# Patient Record
Sex: Male | Born: 1935
Health system: Southern US, Community
[De-identification: ages and names within clinical notes are randomized; demographics above are authoritative.]

## PROBLEM LIST (undated history)

## (undated) DIAGNOSIS — K259 Gastric ulcer, unspecified as acute or chronic, without hemorrhage or perforation: Secondary | ICD-10-CM

## (undated) DIAGNOSIS — I639 Cerebral infarction, unspecified: Secondary | ICD-10-CM

## (undated) DIAGNOSIS — I6529 Occlusion and stenosis of unspecified carotid artery: Secondary | ICD-10-CM

## (undated) DIAGNOSIS — I2583 Coronary atherosclerosis due to lipid rich plaque: Secondary | ICD-10-CM

## (undated) DIAGNOSIS — B019 Varicella without complication: Secondary | ICD-10-CM

## (undated) DIAGNOSIS — I219 Acute myocardial infarction, unspecified: Secondary | ICD-10-CM

## (undated) DIAGNOSIS — K8050 Calculus of bile duct without cholangitis or cholecystitis without obstruction: Secondary | ICD-10-CM

## (undated) DIAGNOSIS — K209 Esophagitis, unspecified without bleeding: Secondary | ICD-10-CM

## (undated) DIAGNOSIS — I350 Nonrheumatic aortic (valve) stenosis: Secondary | ICD-10-CM

## (undated) DIAGNOSIS — K7460 Unspecified cirrhosis of liver: Secondary | ICD-10-CM

## (undated) DIAGNOSIS — I2510 Atherosclerotic heart disease of native coronary artery without angina pectoris: Secondary | ICD-10-CM

## (undated) DIAGNOSIS — R609 Edema, unspecified: Secondary | ICD-10-CM

## (undated) HISTORY — DX: Cerebral infarction, unspecified: I63.9

## (undated) HISTORY — PX: CATARACT EXTRACTION: SUR2

## (undated) HISTORY — DX: Esophagitis, unspecified without bleeding: K20.90

## (undated) HISTORY — DX: Gastric ulcer, unspecified as acute or chronic, without hemorrhage or perforation: K25.9

## (undated) HISTORY — PX: OTHER SURGICAL HISTORY: SHX169

## (undated) HISTORY — DX: Unspecified cirrhosis of liver: K74.60

## (undated) HISTORY — DX: Calculus of bile duct without cholangitis or cholecystitis without obstruction: K80.50

## (undated) HISTORY — DX: Varicella without complication: B01.9

---

## 2007-03-20 ENCOUNTER — Emergency Department (HOSPITAL_COMMUNITY): Admission: EM | Admit: 2007-03-20 | Discharge: 2007-03-21 | Payer: Self-pay | Admitting: *Deleted

## 2007-11-01 ENCOUNTER — Ambulatory Visit (HOSPITAL_BASED_OUTPATIENT_CLINIC_OR_DEPARTMENT_OTHER): Admission: RE | Admit: 2007-11-01 | Discharge: 2007-11-01 | Payer: Self-pay | Admitting: Urology

## 2010-10-29 NOTE — Op Note (Signed)
David Bradshaw, David Bradshaw               ACCOUNT NO.:  [id number]   MEDICAL RECORD NO.:  [id number]          PATIENT TYPE:  AMB   LOCATION:  [facility]                         FACILITY:  [facility]   PHYSICIAN:  Valetta Fuller, M.D.  DATE OF BIRTH:  1926/09/22   DATE OF PROCEDURE:  11/01/2007  DATE OF DISCHARGE:                               OPERATIVE REPORT   PREOPERATIVE DIAGNOSIS:  Right hydrocele.   POSTOPERATIVE DIAGNOSIS:  Simple right hydrocele.   PROCEDURE PERFORMED:  Repair of right hydrocele, bottleneck.   SURGEON:  Valetta Fuller, M.D.   ASSISTANT:  Dr. Melina Schools   ANESTHESIA:  General.   DRAINS:  None.   ESTIMATED BLOOD LOSS:  Minimal.   INDICATIONS FOR PROCEDURE:  [name] is a 77 year old male who had a  right-sided noncommunicating hydrocele by exam.  This was confirmed with  ultrasound.  He had undergone a period of observation but it continued  to bother him and therefore surgical repair was undertaken.   DESCRIPTION OF PROCEDURE:  The patient brought to the operating room.  He was identified by his arm band .  Informed consent was verified and  perioperative time-out was performed.  After the successful induction of  general anesthesia, the patient's genitalia were shaved, prepped and  draped in usual fashion.  A 5 cm incision was made vertically along the  median raphe of the scrotum.  Bovie cautery was used to enter the right  hemiscrotum.  We dissected off the layers through dartos down to tunica  vaginalis.  We then bluntly freed up the hydrocele and delivered it into  the operative field.  Blunt dissection was used to sweep away the  adventitial tissue overlying the hydrocele sac.  Once this was done, we  entered it sharply and drained it.  The tunica vaginalis was then opened  vertically in both cranial and caudal directions.  Once this was done,  we everted the hydrocele sac and examined the testis.  It was viable.  No testicular or epididymal appendages  were seen.  We used Bovie cautery  to ensure hemostasis.  Subtotal reduction the hydrocele sac.  It was  then closed in a bottleneck type repair.  Great care was taken not to  strangulate the cord.  Once the repair was complete, the testis was  placed back in the hemiscrotum.  We closed dartos with a running 3-0  Vicryl.  Skin was closed with running 4-0 Vicryl.  A Tegaderm dressing  was applied.  At this time, the procedure was terminated.  The patient  tolerated the procedure well and there were no  complications.  Barron Alvine was the attending primary responsible  physician who was present and participated in the procedure.   DISPOSITION:  To the Post Anesthesia Care Unit.     ______________________________  Melina Schools, M.D.      Valetta Fuller, M.D.  Electronically Signed    JR/MEDQ  D:  11/01/2007  T:  11/01/2007  Job:  [id number]

## 2011-03-12 LAB — POCT HEMOGLOBIN-HEMACUE
Hemoglobin: 17.4 — ABNORMAL HIGH
Operator id: 268271

## 2011-03-27 LAB — CBC
HCT: 54 — ABNORMAL HIGH
Hemoglobin: 18.1 — ABNORMAL HIGH
MCHC: 33.5
MCV: 92.8
Platelets: 239
RBC: 5.83 — ABNORMAL HIGH
RDW: 13.6
WBC: 16.9 — ABNORMAL HIGH

## 2011-03-27 LAB — URINALYSIS, ROUTINE W REFLEX MICROSCOPIC
Bilirubin Urine: NEGATIVE
Glucose, UA: NEGATIVE
Hgb urine dipstick: NEGATIVE
Ketones, ur: 40 — AB
Nitrite: NEGATIVE
Protein, ur: NEGATIVE
Specific Gravity, Urine: 1.023
Urobilinogen, UA: 1
pH: 6

## 2011-03-27 LAB — POCT CARDIAC MARKERS
CKMB, poc: 1.5
CKMB, poc: 1.7
Myoglobin, poc: 72.9
Myoglobin, poc: 87.8
Operator id: 284251
Operator id: 284251
Troponin i, poc: <0.05
Troponin i, poc: <0.05

## 2011-03-27 LAB — DIFFERENTIAL
Basophils Absolute: 0.2 — ABNORMAL HIGH
Basophils Relative: 1
Eosinophils Absolute: 0.2
Eosinophils Relative: 1
Lymphocytes Relative: 42
Lymphs Abs: 7.1 — ABNORMAL HIGH
Monocytes Absolute: 0.8 — ABNORMAL HIGH
Monocytes Relative: 5
Neutro Abs: 8.6 — ABNORMAL HIGH
Neutrophils Relative %: 51

## 2011-03-27 LAB — I-STAT 8, (EC8 V) (CONVERTED LAB)
BUN: 16
Bicarbonate: 24.9 — ABNORMAL HIGH
Chloride: 107
Glucose, Bld: 168 — ABNORMAL HIGH
HCT: 58 — ABNORMAL HIGH
Hemoglobin: 19.7 — ABNORMAL HIGH
Operator id: 284251
Potassium: 4.6
Sodium: 138
TCO2: 26
pCO2, Ven: 42 — ABNORMAL LOW
pH, Ven: 7.38 — ABNORMAL HIGH

## 2011-03-27 LAB — HEPATIC FUNCTION PANEL
ALT: 16
AST: 34
Albumin: 3.9
Alkaline Phosphatase: 66
Bilirubin, Direct: 0.5 — ABNORMAL HIGH
Indirect Bilirubin: 1 — ABNORMAL HIGH
Total Bilirubin: 1.5 — ABNORMAL HIGH
Total Protein: 7.2

## 2011-03-27 LAB — B-NATRIURETIC PEPTIDE (CONVERTED LAB): Pro B Natriuretic peptide (BNP): 68

## 2011-03-27 LAB — D-DIMER, QUANTITATIVE: D-Dimer, Quant: 0.37

## 2011-03-27 LAB — POCT I-STAT CREATININE
Creatinine, Ser: 1.2
Operator id: 284251

## 2011-03-27 LAB — PATHOLOGIST SMEAR REVIEW

## 2015-07-18 DIAGNOSIS — Z6830 Body mass index (BMI) 30.0-30.9, adult: Secondary | ICD-10-CM | POA: Diagnosis not present

## 2016-01-30 ENCOUNTER — Emergency Department (HOSPITAL_COMMUNITY): Payer: Medicare HMO

## 2016-01-30 ENCOUNTER — Ambulatory Visit (HOSPITAL_COMMUNITY)
Admission: EM | Admit: 2016-01-30 | Discharge: 2016-01-30 | Disposition: A | Discharge status: Nursing Home (Includes ICF) | Payer: 59

## 2016-01-30 ENCOUNTER — Encounter (HOSPITAL_COMMUNITY): Payer: Self-pay | Admitting: Family Medicine

## 2016-01-30 ENCOUNTER — Encounter (HOSPITAL_COMMUNITY): Payer: Self-pay | Admitting: Emergency Medicine

## 2016-01-30 ENCOUNTER — Observation Stay (HOSPITAL_COMMUNITY): Payer: Medicare HMO

## 2016-01-30 ENCOUNTER — Inpatient Hospital Stay (HOSPITAL_COMMUNITY)
Admission: EM | Admit: 2016-01-30 | Discharge: 2016-02-06 | DRG: 066 | Disposition: A | Discharge status: Home / Self Care | Payer: Medicare HMO | Attending: Internal Medicine | Admitting: Internal Medicine

## 2016-01-30 DIAGNOSIS — D582 Other hemoglobinopathies: Secondary | ICD-10-CM | POA: Diagnosis not present

## 2016-01-30 DIAGNOSIS — R9439 Abnormal result of other cardiovascular function study: Secondary | ICD-10-CM

## 2016-01-30 DIAGNOSIS — M5412 Radiculopathy, cervical region: Secondary | ICD-10-CM | POA: Diagnosis present

## 2016-01-30 DIAGNOSIS — R0602 Shortness of breath: Secondary | ICD-10-CM

## 2016-01-30 DIAGNOSIS — I6521 Occlusion and stenosis of right carotid artery: Secondary | ICD-10-CM | POA: Diagnosis not present

## 2016-01-30 DIAGNOSIS — N289 Disorder of kidney and ureter, unspecified: Secondary | ICD-10-CM | POA: Diagnosis present

## 2016-01-30 DIAGNOSIS — Z7982 Long term (current) use of aspirin: Secondary | ICD-10-CM

## 2016-01-30 DIAGNOSIS — R200 Anesthesia of skin: Secondary | ICD-10-CM | POA: Diagnosis not present

## 2016-01-30 DIAGNOSIS — I639 Cerebral infarction, unspecified: Secondary | ICD-10-CM

## 2016-01-30 DIAGNOSIS — I350 Nonrheumatic aortic (valve) stenosis: Secondary | ICD-10-CM | POA: Diagnosis present

## 2016-01-30 DIAGNOSIS — R21 Rash and other nonspecific skin eruption: Secondary | ICD-10-CM | POA: Diagnosis not present

## 2016-01-30 DIAGNOSIS — I63231 Cerebral infarction due to unspecified occlusion or stenosis of right carotid arteries: Secondary | ICD-10-CM | POA: Diagnosis not present

## 2016-01-30 DIAGNOSIS — Z955 Presence of coronary angioplasty implant and graft: Secondary | ICD-10-CM

## 2016-01-30 DIAGNOSIS — Z8249 Family history of ischemic heart disease and other diseases of the circulatory system: Secondary | ICD-10-CM

## 2016-01-30 DIAGNOSIS — R931 Abnormal findings on diagnostic imaging of heart and coronary circulation: Secondary | ICD-10-CM | POA: Diagnosis not present

## 2016-01-30 DIAGNOSIS — Z87891 Personal history of nicotine dependence: Secondary | ICD-10-CM

## 2016-01-30 DIAGNOSIS — R29898 Other symptoms and signs involving the musculoskeletal system: Secondary | ICD-10-CM | POA: Diagnosis not present

## 2016-01-30 DIAGNOSIS — I2583 Coronary atherosclerosis due to lipid rich plaque: Secondary | ICD-10-CM | POA: Diagnosis present

## 2016-01-30 DIAGNOSIS — E785 Hyperlipidemia, unspecified: Secondary | ICD-10-CM | POA: Diagnosis present

## 2016-01-30 DIAGNOSIS — R079 Chest pain, unspecified: Secondary | ICD-10-CM | POA: Diagnosis not present

## 2016-01-30 DIAGNOSIS — T45525A Adverse effect of antithrombotic drugs, initial encounter: Secondary | ICD-10-CM | POA: Diagnosis not present

## 2016-01-30 DIAGNOSIS — G459 Transient cerebral ischemic attack, unspecified: Secondary | ICD-10-CM

## 2016-01-30 DIAGNOSIS — G458 Other transient cerebral ischemic attacks and related syndromes: Secondary | ICD-10-CM

## 2016-01-30 DIAGNOSIS — R531 Weakness: Secondary | ICD-10-CM | POA: Diagnosis not present

## 2016-01-30 DIAGNOSIS — R29700 NIHSS score 0: Secondary | ICD-10-CM | POA: Diagnosis present

## 2016-01-30 DIAGNOSIS — G451 Carotid artery syndrome (hemispheric): Secondary | ICD-10-CM | POA: Diagnosis not present

## 2016-01-30 DIAGNOSIS — Z8673 Personal history of transient ischemic attack (TIA), and cerebral infarction without residual deficits: Secondary | ICD-10-CM | POA: Diagnosis present

## 2016-01-30 DIAGNOSIS — I252 Old myocardial infarction: Secondary | ICD-10-CM

## 2016-01-30 DIAGNOSIS — D751 Secondary polycythemia: Secondary | ICD-10-CM | POA: Diagnosis not present

## 2016-01-30 DIAGNOSIS — I6529 Occlusion and stenosis of unspecified carotid artery: Secondary | ICD-10-CM

## 2016-01-30 DIAGNOSIS — R208 Other disturbances of skin sensation: Secondary | ICD-10-CM | POA: Diagnosis not present

## 2016-01-30 DIAGNOSIS — I638 Other cerebral infarction: Secondary | ICD-10-CM | POA: Diagnosis not present

## 2016-01-30 DIAGNOSIS — I77819 Aortic ectasia, unspecified site: Secondary | ICD-10-CM | POA: Diagnosis present

## 2016-01-30 DIAGNOSIS — I63031 Cerebral infarction due to thrombosis of right carotid artery: Secondary | ICD-10-CM | POA: Diagnosis not present

## 2016-01-30 DIAGNOSIS — I63032 Cerebral infarction due to thrombosis of left carotid artery: Secondary | ICD-10-CM | POA: Diagnosis not present

## 2016-01-30 DIAGNOSIS — I2510 Atherosclerotic heart disease of native coronary artery without angina pectoris: Secondary | ICD-10-CM | POA: Diagnosis present

## 2016-01-30 DIAGNOSIS — N141 Nephropathy induced by other drugs, medicaments and biological substances: Secondary | ICD-10-CM | POA: Diagnosis present

## 2016-01-30 HISTORY — DX: Atherosclerotic heart disease of native coronary artery without angina pectoris: I25.10

## 2016-01-30 HISTORY — DX: Coronary atherosclerosis due to lipid rich plaque: I25.83

## 2016-01-30 HISTORY — DX: Acute myocardial infarction, unspecified: I21.9

## 2016-01-30 LAB — COMPREHENSIVE METABOLIC PANEL
ALT: 30 U/L (ref 17–63)
AST: 43 U/L — ABNORMAL HIGH (ref 15–41)
Albumin: 3.6 g/dL (ref 3.5–5.0)
Alkaline Phosphatase: 87 U/L (ref 38–126)
Anion gap: 7 (ref 5–15)
BUN: 13 mg/dL (ref 6–20)
CO2: 24 mmol/L (ref 22–32)
Calcium: 9.2 mg/dL (ref 8.9–10.3)
Chloride: 107 mmol/L (ref 101–111)
Creatinine, Ser: 1.2 mg/dL (ref 0.61–1.24)
GFR calc Af Amer: >60 mL/min (ref >60)
GFR calc non Af Amer: 53 mL/min — ABNORMAL LOW (ref >60)
Glucose, Bld: 140 mg/dL — ABNORMAL HIGH (ref 65–99)
Potassium: 4.9 mmol/L (ref 3.5–5.1)
Sodium: 138 mmol/L (ref 135–145)
Total Bilirubin: 1 mg/dL (ref 0.3–1.2)
Total Protein: 6.8 g/dL (ref 6.5–8.1)

## 2016-01-30 LAB — I-STAT CHEM 8, ED
BUN: 17 mg/dL (ref 6–20)
Calcium, Ion: 1.12 mmol/L (ref 1.12–1.23)
Chloride: 105 mmol/L (ref 101–111)
Creatinine, Ser: 1 mg/dL (ref 0.61–1.24)
Glucose, Bld: 134 mg/dL — ABNORMAL HIGH (ref 65–99)
HCT: 56 % — ABNORMAL HIGH (ref 39.0–52.0)
Hemoglobin: 19 g/dL — ABNORMAL HIGH (ref 13.0–17.0)
Potassium: 4.6 mmol/L (ref 3.5–5.1)
Sodium: 141 mmol/L (ref 135–145)
TCO2: 24 mmol/L (ref 0–100)

## 2016-01-30 LAB — I-STAT TROPONIN, ED: Troponin i, poc: 0 ng/mL (ref 0.00–0.08)

## 2016-01-30 LAB — DIFFERENTIAL
Basophils Absolute: 0.1 10*3/uL (ref 0.0–0.1)
Basophils Relative: 1 %
Eosinophils Absolute: 0.1 10*3/uL (ref 0.0–0.7)
Eosinophils Relative: 1 %
Lymphocytes Relative: 32 %
Lymphs Abs: 3.4 10*3/uL (ref 0.7–4.0)
Monocytes Absolute: 0.9 10*3/uL (ref 0.1–1.0)
Monocytes Relative: 8 %
Neutro Abs: 6.2 10*3/uL (ref 1.7–7.7)
Neutrophils Relative %: 58 %

## 2016-01-30 LAB — CBC
HCT: 53.7 % — ABNORMAL HIGH (ref 39.0–52.0)
Hemoglobin: 18.3 g/dL — ABNORMAL HIGH (ref 13.0–17.0)
MCH: 31.7 pg (ref 26.0–34.0)
MCHC: 34.1 g/dL (ref 30.0–36.0)
MCV: 93.1 fL (ref 78.0–100.0)
Platelets: 184 10*3/uL (ref 150–400)
RBC: 5.77 MIL/uL (ref 4.22–5.81)
RDW: 13.7 % (ref 11.5–15.5)
WBC: 10.6 10*3/uL — ABNORMAL HIGH (ref 4.0–10.5)

## 2016-01-30 LAB — PROTIME-INR
INR: 1.04
Prothrombin Time: 13.6 seconds (ref 11.4–15.2)

## 2016-01-30 LAB — APTT: aPTT: 33 seconds (ref 24–36)

## 2016-01-30 MED ORDER — ATORVASTATIN CALCIUM 40 MG PO TABS
40.0000 mg | ORAL_TABLET | Freq: Every day | ORAL | Status: DC
  Administered 2016-01-31: 40 mg via ORAL
  Filled 2016-01-30 (×2): qty 1

## 2016-01-30 MED ORDER — STROKE: EARLY STAGES OF RECOVERY BOOK
Freq: Once | Status: AC
  Administered 2016-02-02: 05:00:00
  Filled 2016-01-30: qty 1

## 2016-01-30 MED ORDER — ASPIRIN 325 MG PO TABS
325.0000 mg | ORAL_TABLET | Freq: Every day | ORAL | Status: DC
  Administered 2016-01-31 – 2016-02-03 (×3): 325 mg via ORAL
  Filled 2016-01-30 (×4): qty 1

## 2016-01-30 NOTE — ED Provider Notes (Signed)
[facility] DEPT Provider Note   CSN: [id number] Arrival date & time: 01/30/16  1517     History   Chief Complaint Chief Complaint  Patient presents with  . Extremity Weakness    HPI David Bradshaw is a 82 y.o. male.  The history is provided by the patient.  Extremity Weakness  This is a new problem. Episode onset: 12 hours ago. lasted 30 min. The problem occurs rarely. The problem has been resolved. Pertinent negatives include no chest pain, no abdominal pain, no headaches and no shortness of breath. Nothing aggravates the symptoms. Relieved by: self resolving. He has tried nothing for the symptoms.   Reports associated nausea w/o emesis. No diaphoresis. No arm pain or neck pain. No back pain. No vision or speech change, light-headedness/dizziness.  Prior MI in '88 and '90 s/p PCI angioplasty, no stents or CABG.   No past medical history on file.  Patient Active Problem List   Diagnosis Date Noted  . TIA (transient ischemic attack) 01/30/2016    No past surgical history on file.     Home Medications    Prior to Admission medications   Medication Sig Start Date End Date Taking? Authorizing Provider  aspirin EC 325 MG tablet Take 325 mg by mouth every other day.   Yes Historical Provider, MD  aspirin 81 MG tablet Take 81 mg by mouth daily.    Historical Provider, MD    Family History No family history on file.  Social History Social History  Substance Use Topics  . Smoking status: Not on file  . Smokeless tobacco: Not on file  . Alcohol use Not on file     Allergies   Review of patient's allergies indicates no known allergies.   Review of Systems Review of Systems  Constitutional: Negative for chills, diaphoresis, fatigue and fever.  HENT: Negative for congestion and rhinorrhea.   Eyes: Negative for visual disturbance.  Respiratory: Negative for cough, chest tightness and shortness of breath.   Cardiovascular: Negative for chest pain and leg  swelling.  Gastrointestinal: Positive for nausea. Negative for abdominal pain and vomiting.  Genitourinary: Negative for difficulty urinating.  Musculoskeletal: Positive for extremity weakness. Negative for back pain and gait problem.  Neurological: Positive for numbness. Negative for dizziness, speech difficulty, light-headedness and headaches.  All other systems reviewed and are negative.    Physical Exam Updated Vital Signs BP 147/79   Pulse 83   Temp 98.2 F (36.8 C) (Oral)   Resp 21   Ht 5' (1.524 m)   Wt 150 lb (68 kg)   SpO2 93%   BMI 29.29 kg/m   Physical Exam  Constitutional: He is oriented to person, place, and time. He appears well-nourished. No distress.  HENT:  Head: Normocephalic and atraumatic.  Right Ear: External ear normal.  Left Ear: External ear normal.  Eyes: Pupils are equal, round, and reactive to light. Right eye exhibits no discharge. Left eye exhibits no discharge. No scleral icterus.  Neck: Normal range of motion. Neck supple.  Cardiovascular: Normal rate.  Exam reveals no gallop and no friction rub.   No murmur heard. Pulmonary/Chest: Effort normal and breath sounds normal. No stridor. No respiratory distress. He has no wheezes. He has no rales. He exhibits no tenderness.  Abdominal: Soft. He exhibits no distension and no mass. There is no tenderness. There is no rebound and no guarding.  Musculoskeletal: He exhibits no edema or tenderness.  Neurological: He is alert and oriented to person,  place, and time.  Mental Status: Alert and oriented to person, place, and time. Attention and concentration normal. Speech clear. Recent memory is intac  Cranial Nerves  II Visual Fields: Intact to confrontation. Visual fields intact. III, IV, VI: Pupils equal and reactive to light and near. Full eye movement without nystagmus  V Facial Sensation: Normal. No weakness of masticatory muscles  VII: No facial weakness or asymmetry  VIII Auditory Acuity: Grossly  normal  IX/X: The uvula is midline; the palate elevates symmetrically  XI: Normal sternocleidomastoid and trapezius strength  XII: The tongue is midline. No atrophy or fasciculations.   Motor System: Muscle Strength: 5/5 and symmetric in the upper and lower extremities. No pronation or drift.  Muscle Tone: Tone and muscle bulk are normal in the upper and lower extremities.   Reflexes: DTRs: 2+ and symmetrical in all four extremities. Plantar responses are flexor bilaterally.  Coordination: Intact finger-to-nose, heel-to-shin, and rapid alternating movements. No tremor.  Sensation: Intact to light touch, and pinprick. Negative Romberg test.  Gait: Routine and tandem gait are normal    Skin: Skin is warm and dry. No rash noted. He is not diaphoretic. No erythema.     ED Treatments / Results  Labs (all labs ordered are listed, but only abnormal results are displayed) Labs Reviewed  CBC - Abnormal; Notable for the following:       Result Value   WBC 10.6 (*)    Hemoglobin 18.3 (*)    HCT 53.7 (*)    All other components within normal limits  COMPREHENSIVE METABOLIC PANEL - Abnormal; Notable for the following:    Glucose, Bld 140 (*)    AST 43 (*)    GFR calc non Af Amer 53 (*)    All other components within normal limits  I-STAT CHEM 8, ED - Abnormal; Notable for the following:    Glucose, Bld 134 (*)    Hemoglobin 19.0 (*)    HCT 56.0 (*)    All other components within normal limits  PROTIME-INR  APTT  DIFFERENTIAL  I-STAT TROPOININ, ED  CBG MONITORING, ED    EKG  EKG Interpretation  Date/Time:  Wednesday January 30 2016 15:55:49 EDT Ventricular Rate:  103 PR Interval:  218 QRS Duration: 92 QT Interval:  320 QTC Calculation: 419 R Axis:   131 Text Interpretation:  Sinus tachycardia with 1st degree A-V block with occasional Premature ventricular complexes Possible Left atrial enlargement Low voltage QRS Possible Anterolateral infarct , age undetermined Abnormal ECG No  significant change since last tracing Confirmed by [facility] MD, Brissa Asante [phone]) on 01/30/2016 8:19:14 PM       Radiology Ct Head Wo Contrast  Result Date: 01/30/2016 CLINICAL DATA:  Left arm numbness and nausea up earlier today which has result. EXAM: CT HEAD WITHOUT CONTRAST TECHNIQUE: Contiguous axial images were obtained from the base of the skull through the vertex without intravenous contrast. COMPARISON:  None. FINDINGS: Brain: Generalized atrophy. The brainstem is normal. There may be an old small vessel left cerebellar infarction. Cerebral hemispheres show an old infarction in the right parietal cortical and subcortical brain. No sign of acute infarction, intra-axial mass lesion, hemorrhage, hydrocephalus or extra-axial collection. 7 mm left frontal dural calcification could represent a small calcified meningioma, not significant. Vascular: There is atherosclerotic calcification of the major vessels at the base of the brain. Skull: Normal Sinuses/Orbits: Clear Other: None IMPRESSION: No acute finding. Generalized atrophy. 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 Electronically Signed  By: Nelson Chimes M.D.   On: 01/30/2016 16:54    Procedures Procedures (including critical care time)  Medications Ordered in ED Medications - No data to display   Initial Impression / Assessment and Plan / ED Course  I have reviewed the triage vital signs and the nursing notes.  Pertinent labs & imaging results that were available during my care of the patient were reviewed by me and considered in my medical decision making (see chart for details).  Clinical Course    Presentation was concerning for TIA. CT head negative. EKG without acute changes. Initial troponin negative. Low suspicion for ACS. Spoke with neurology who recommended admission for TIA workup. Patient will be admitted to medicine for further workup.  Final Clinical Impressions(s) / ED Diagnoses   Final diagnoses:  Other  specified transient cerebral ischemias    Disposition: Admit  Condition: stable    Fatima Blank, MD 01/30/16 2202

## 2016-01-30 NOTE — ED Provider Notes (Signed)
CSN: [id number]     Arrival date & time 01/30/16  1517 History   None    No chief complaint on file.  (Consider location/radiation/quality/duration/timing/severity/associated sxs/prior Treatment) HPI Patient is an 82 year old man who states that he was sitting in his recliner at home and suddenly developed weakness and loss of sensation in his left arm. He states that these symptoms lasted for approximately 30 minutes. He states it is unclear whether he had slurred speech because there was no one for him to talk to him he only called his son after symptoms have completely resolved. Previous history of MI with balloon treatment but no stents. He takes no medications except an aspirin daily. He has not had a primary care doctor since about 5. No past medical history on file. No past surgical history on file. No family history on file. Social History  Substance Use Topics  . Smoking status: Not on file  . Smokeless tobacco: Not on file  . Alcohol use Not on file    Review of Systems  Denies: HEADACHE, NAUSEA, ABDOMINAL PAIN, CHEST PAIN, CONGESTION, DYSURIA, SHORTNESS OF BREATH  Allergies  Review of patient's allergies indicates no known allergies.  Home Medications   Prior to Admission medications   Medication Sig Start Date End Date Taking? Authorizing Provider  aspirin 81 MG tablet Take 81 mg by mouth daily.    Historical Provider, MD   Meds Ordered and Administered this Visit  Medications - No data to display  There were no vitals taken for this visit. No data found.   Physical Exam NURSES NOTES AND VITAL SIGNS REVIEWED. CONSTITUTIONAL: Well developed, well nourished, no acute distress HEENT: normocephalic, atraumatic EYES: Conjunctiva normal NECK:normal ROM, supple, no adenopathy PULMONARY:No respiratory distress, normal effort ABDOMINAL: Soft, ND, NT BS+, No CVAT MUSCULOSKELETAL: Normal ROM of all extremities,  SKIN: warm and dry without rash PSYCHIATRIC: Mood and  affect, behavior are normal. NEUROLOGICAL SCREENING EXAM: Constitutional:  oriented to person, place, and time.  Neurological:  .  normal strength and normal reflexes. No cranial nerve deficit or sensory deficit. negative Romberg sign. GCS eye subscore is 4. GCS verbal subscore is 5. GCS motor subscore is 6.    Urgent Care Course   Clinical Course    Procedures (including critical care time)  Labs Review Labs Reviewed - No data to display  Imaging Review No results found.   Visual Acuity Review  Right Eye Distance:   Left Eye Distance:   Bilateral Distance:    Right Eye Near:   Left Eye Near:    Bilateral Near:        Discussion with neurology PA on duty and he has suggested ER for TIA workup. MDM  No diagnosis found. Patient is advised to go to emergency department for TIA evaluation and possible admission for TIA workup.    Konrad Felix, PA 01/30/16 [phone]

## 2016-01-30 NOTE — ED Notes (Signed)
Patient transported to MRI at this time via ED stretcher.  Pt in no apparent distress at this time.

## 2016-01-30 NOTE — ED Notes (Signed)
Reports to [place], rn

## 2016-01-30 NOTE — H&P (Signed)
History and Physical  Patient Name: David Bradshaw     [id number]    DOB: 1926/11/11    DOA: 01/30/2016 PCP: No PCP Per Patient   Patient coming from: Home     Chief Complaint: Left arm weakness/numbness  HPI: David Bradshaw is a 82 y.o. male with a past medical history significant for CAD s/p MI in [place] who presents with transient left arm weakness/numbness.  The patient lives alone and was watching television today when he had sudden onset of left arm numbness and "it felt dead" and "I couldn't use it".  He went outside and rubbed it and eventually it resolved after about a 15 or 20 minute period.  He had no other confusion, LOC, seizure, weakness, speech disturbance, numbness, or focal weakness.  He called his son who said his speech seemed normal, but asked him to go get checked out.  ED course: -Afebrile, heart rate initially 90s, respirations and blood pressure normal -Na 138, K 4.9, Cr 1.2 (baseline 1.2), WBC 10.6K, Hgb 18.3 (previously 17 in 2008), coags and troponin normal -CT head showed old infarct, ECG showed sinus tachycardia and 1st deg AVB with sinus rhythm -Neuro evaluated the patient and thought it was likely TIA, and TRH were asked to evaluate for admission     Review of Systems:  All other systems negative except as just noted or noted in the history of present illness.  Past Medical History:  Diagnosis Date  . Coronary arteriosclerosis due to lipid rich plaque 01/30/2016  . Myocardial infarct ([place])    1988 and 1990    History reviewed. No pertinent surgical history.  Social History: Patient lives alone.  Patient walks unassisted.  Former smoker.  No alcohol.    No Known Allergies  Family history: family history includes Bone cancer in his father; Heart attack in his mother.  Prior to Admission medications   Medication Sig Start Date End Date Taking? Authorizing Provider  aspirin EC 325 MG tablet Take 325 mg by mouth every other day.    Yes Historical Provider, MD     Physical Exam: BP 108/85   Pulse 83   Temp 98.2 F (36.8 C) (Oral)   Resp 15   Ht 5' (1.524 m)   Wt 68 kg (150 lb)   SpO2 90%   BMI 29.29 kg/m  General appearance: Well-developed, elderly adult male, alert and in no acute distress.   Eyes: Anicteric, conjunctiva red, lids and lashes normal.     ENT: No nasal deformity, discharge, or epistaxis.  OP moist without lesions.   Lymph: No cervical, supraclavicular or axillary lymphadenopathy. Skin: Warm and dry.  No jaundice.  No suspicious rashes or lesions. Cardiac: RRR, nl S1-S2, no murmurs appreciated.  Capillary refill is brisk.  JVP normal.  No LE edema.  Radial and DP pulses 2+ and symmetric.  No carotid bruits. Respiratory: Normal respiratory rate and rhythm.  CTAB without rales or wheezes. GI: Abdomen soft without rigidity.  No TTP. No ascites, distension.   MSK: No deformities or effusions. Neuro: Pupils are 4 mm and reactive to 3 mm. Extraocular movements are intact, without nystagmus. Cranial nerve 5 is within normal limits. Cranial nerve 7 is symmetrical. Cranial nerve 8 is within normal limits. Cranial nerves 9 and 10 reveal equal palate elevation. Cranial nerve 11 reveals sternocleidomastoid strong. Cranial nerve 12 is midline. I do not note a deficit in motor strength testing in the upper and lower extremities bilaterally with  normal motor, tone and bulk. Romberg maneuver is negative for pathology. Finger-to-nose testing is within normal limits. The patient is oriented to time, place and person. Speech is fluent. Naming is grossly intact. Recall, recent and remote, as well as general fund of knowledge seem within normal limits. Attention span and concentration are within normal limits.   Psych: Behavior appropriate.  Affect normal.  No evidence of aural or visual hallucinations or delusions.       Labs on Admission:  I have personally reviewed following labs and imaging  studies: CBC:  Recent Labs Lab 01/30/16 1604 01/30/16 1632  WBC 10.6*  --   NEUTROABS 6.2  --   HGB 18.3* 19.0*  HCT 53.7* 56.0*  MCV 93.1  --   PLT 184  --    Basic Metabolic Panel:  Recent Labs Lab 01/30/16 1604 01/30/16 1632  NA 138 141  K 4.9 4.6  CL 107 105  CO2 24  --   GLUCOSE 140* 134*  BUN 13 17  CREATININE 1.20 1.00  CALCIUM 9.2  --    GFR: Estimated Creatinine Clearance: 42.9 mL/min (by C-G formula based on SCr of 1 mg/dL). Liver Function Tests:  Recent Labs Lab 01/30/16 1604  AST 43*  ALT 30  ALKPHOS 87  BILITOT 1.0  PROT 6.8  ALBUMIN 3.6  Coagulation Profile:  Recent Labs Lab 01/30/16 1604  INR 1.04     Radiological Exams on Admission: Following report was personally reviewed: Ct Head Wo Contrast  Result Date: 01/30/2016 CLINICAL DATA:  Left arm numbness and nausea up earlier today which has result. EXAM: CT HEAD WITHOUT CONTRAST TECHNIQUE: Contiguous axial images were obtained from the base of the skull through the vertex without intravenous contrast. COMPARISON:  None. FINDINGS: Brain: Generalized atrophy. The brainstem is normal. There may be an old small vessel left cerebellar infarction. Cerebral hemispheres show an old infarction in the right parietal cortical and subcortical brain. No sign of acute infarction, intra-axial mass lesion, hemorrhage, hydrocephalus or extra-axial collection. 7 mm left frontal dural calcification could represent a small calcified meningioma, not significant. Vascular: There is atherosclerotic calcification of the major vessels at the base of the brain. Skull: Normal Sinuses/Orbits: Clear Other: None IMPRESSION: No acute finding. Generalized atrophy. 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 Electronically Signed   By: Nelson Chimes M.D.   On: 01/30/2016 16:54     EKG: Independently reviewed. Rate 103, QTc normal, 1st deg AVB, right axis.    Assessment/Plan 1. Acute TIA:  This is new.  MRI  pending.  ABCD 4. -Admit to telemetry -Neuro checks, NIHSS per protocol -Increase aspirin 325 mg to daily -Lipids, hemoglobin A1c -Carotid doppler ordered -Echocardiogram ordered -Consult to Neurology, appreciate recommendations   2. Polycythemia:  Unclear significance.  Chronic. -Gentle IV fluids -Re-check tomorrow to verify -CXR ordered      DVT prophylaxis: Lovenox  Code Status: FULL  Family Communication: None present  Disposition Plan: Anticipate Stroke work up as above and consult to ancillary services.  Expect discharge within 1 day. Consults called: Neurology, Dr. Shon Hale has seen patient. Admission status: Telemetry, OBS status     Medical decision making: Patient seen at 10:58 PM on 01/30/2016.  The patient was discussed with Dr. Leonette Monarch. What exists of the patient's chart was reviewed in depth.  Clinical condition: stable.       Edwin Dada [facility] Pager [phone]

## 2016-01-30 NOTE — ED Triage Notes (Signed)
Linde Gillis, pa was handed ekg

## 2016-01-30 NOTE — ED Triage Notes (Signed)
Pt in from home c/o L arm wekaness lasting 20 mins today @ 8:00, pt reports dizziness with symptoms, pt hx of MI, pt denies SOB, CP, n/v/d, no facial droop, denies slurred speech, takes 81 mg ASA daily

## 2016-01-30 NOTE — Consult Note (Signed)
Neurology Consult Note  Reason for Consultation: TIA  Requesting provider: Addison Lank, MD  CC: Left arm numbness  HPI: This is an 82 year old right-handed man who presents to the emergency department at the urging of his children to be evaluated for an episode of left arm numbness. The patient reports that he has been in his usual good state of health until today. This morning he was sitting in his recliner when he noticed the abrupt onset of numbness in his left arm. He states that his left hand felt tingly but the rest of his arm was "dead." He thinks he also had some weakness and states that he could move his arm but could not use his hand to manipulate things. This was accompanied by some nausea. He estimates that his symptoms lasted 15-20 minutes and then completely resolved. He denies any associated vision loss, double vision, hearing changes, difficulty swallowing, coordination problems, balance problems, or changes in his gait. There was no associated headache. He was alone and did not try to speak so he is unsure if there were any changes with his language or speech. He has not had any similar symptoms in the past and denies any history of transient neurologic deficits.  The patient reports that he has a history of 2 heart attacks in 1988 and 1990. He denies any other health problems but does not see a physician. He states that he takes no medications apart from aspirin 325 mg every other day.  PMH:  1. Coronary artery disease with myocardial infarction 2 in 1988 and 1990  PSH:  1. Probable coronary artery stenting in the setting of MI, 1988 and 1990  Family history: He denies any major health problems in the family.  Social history:  He is a widower. He lives alone and is fully independent in managing his household and his property. He has 3 adult children who check in on him. He has a remote history of tobacco use having stopped smoking cigarettes 30 years ago. He denies  smokeless tobacco use. He denies alcohol use. He has no history of illicit drug use.   Current outpatient meds: Current Meds  Medication Sig  . aspirin EC 325 MG tablet Take 325 mg by mouth every other day.    Current inpatient meds:  No current facility-administered medications for this encounter.    Current Outpatient Prescriptions  Medication Sig Dispense Refill  . aspirin EC 325 MG tablet Take 325 mg by mouth every other day.    Marland Kitchen aspirin 81 MG tablet Take 81 mg by mouth daily.      Allergies: No Known Allergies  ROS: As per HPI. A full 14-point review of systems was performed and is otherwise notable for some difficulty initiating his urinary stream. Review systems is otherwise negative.  PE:  BP 132/98   Pulse 92   Temp 98.2 F (36.8 C) (Oral)   Resp 22   Ht 5' (1.524 m)   Wt 68 kg (150 lb)   SpO2 92%   BMI 29.29 kg/m   General: WDWN, no acute distress. AAO x4. Speech clear, no dysarthria. No aphasia. Follows commands briskly. Affect is bright with congruent mood. Comportment is normal.  HEENT: Normocephalic. Neck supple without LAD. MMM, OP clear. Edentulous. Sclerae anicteric. No conjunctival injection.  CV: Regular, no murmur. Carotid pulses full and symmetric, no bruits. Distal pulses 2+ and symmetric.  Lungs: CTAB.  Abdomen: Soft, non-distended, non-tender. Bowel sounds present x4.  Extremities: No C/C/E. Neuro:  CN: Pupils are equal and round. They are symmetrically reactive from 3-->2 mm. EOMI without nystagmus. No reported diplopia. Facial sensation is intact to light touch. Face is symmetric at rest with normal strength and mobility. Hearing is intact to conversational voice. Palate elevates symmetrically and uvula is midline. Voice is normal in tone, pitch and quality. Bilateral SCM and trapezii are 5/5. Tongue is midline with normal bulk and mobility.  Motor: Normal bulk, tone, and strength. No tremor or other abnormal movements. No drift.  Sensation:  Intact to light touch and pinprick.  DTRs: 2+, symmetric with trace ankle jerks. Toes downgoing bilaterally. No pathologic reflexes.  Coordination: Finger-to-nose without dysmetria. Finger taps are normal in amplitude and speed, no decrement.    Labs:  Lab Results  Component Value Date   WBC 10.6 (H) 01/30/2016   HGB 19.0 (H) 01/30/2016   HCT 56.0 (H) 01/30/2016   PLT 184 01/30/2016   GLUCOSE 134 (H) 01/30/2016   ALT 30 01/30/2016   AST 43 (H) 01/30/2016   NA 141 01/30/2016   K 4.6 01/30/2016   CL 105 01/30/2016   CREATININE 1.00 01/30/2016   BUN 17 01/30/2016   CO2 24 01/30/2016   INR 1.04 01/30/2016    Imaging:  I have personally and independently reviewed the CT scan of the head without contrast from 01/30/16. There is a focal area of hypodensity in the superior aspect of the right parietal lobe near the vertex, likely involving the postcentral gyrus. This is consistent with remote injury and could represent an old infarct versus old traumatic injury. He has moderate diffuse generalized atrophy. No obvious acute abnormality is appreciated. Calcification of the intracranial arteries is noted. The intracranial vessels also appear to be relatively hyperdense but this could be reflective of his elevated hemoglobin level.  I have also reviewed the radiologist's interpretation of this scan and agree with those findings.  Assessment and Plan:  1. Transient ischemic attack: His symptoms as reported are certainly consistent with a TIA involving the right middle cerebral artery territory. Of note, he appears to have a chronic infarction in the superior aspect of the right parietal lobe though does not report any known prior stroke. This is in a location that would be expected to give sensory changes in the left upper extremity, raising the possibility of potential recrudescence of old deficit. Another consideration could be simple partial seizure caused by encephalomalacia in the right parietal  lobe, though this seems less likely given negative symptoms. Regardless, he needs to be worked up for an acute ischemic event. His only reported risk factor for cerebrovascular disease is coronary artery disease with history of MI. His age is also a risk factor. However, he does not seek regular medical care so he may have additional factors such as hypertension, hyperlipidemia, and diabetes that have not been identified. I will order MRI scan of the brain without contrast, carotid Dopplers, transthoracic echocardiogram, fasting lipids, and hemoglobin A1c. Continue telemetry to monitor for any cardiac arrhythmia such as atrial fibrillation. I will increase his aspirin to 325 mg daily. I will add atorvastatin 40 mg daily. Monitor blood pressure to determine whether he has baseline hypertension. Additional recommendations will follow above testing as needed.  2. Left upper extremity numbness: This was transient, due to probable TIA as above. Symptoms have fully resolved. This can be followed.  3. Left upper extremity weakness: Again, transient, due to probable TIA. No intervention necessary at this time.  This was discussed with the  patient and his son in the emergency department. They're in agreement with the plan as noted above. They were given the opportunity to ask any questions and these were addressed to their satisfaction.  Thank you for the opportunity to participate in this patient's care. Please feel free to call with any questions or concerns. Neurology will continue to follow with the stroke service assuming care of the patient beginning 01/31/16.

## 2016-01-30 NOTE — ED Notes (Addendum)
Stroke swallow screen performed at 2009.

## 2016-01-31 ENCOUNTER — Encounter (HOSPITAL_COMMUNITY): Payer: Self-pay | Admitting: *Deleted

## 2016-01-31 ENCOUNTER — Observation Stay (HOSPITAL_COMMUNITY): Payer: Medicare HMO

## 2016-01-31 ENCOUNTER — Observation Stay (HOSPITAL_BASED_OUTPATIENT_CLINIC_OR_DEPARTMENT_OTHER): Payer: Medicare HMO

## 2016-01-31 DIAGNOSIS — R079 Chest pain, unspecified: Secondary | ICD-10-CM | POA: Diagnosis not present

## 2016-01-31 DIAGNOSIS — M79609 Pain in unspecified limb: Secondary | ICD-10-CM | POA: Diagnosis not present

## 2016-01-31 DIAGNOSIS — I6523 Occlusion and stenosis of bilateral carotid arteries: Secondary | ICD-10-CM | POA: Diagnosis not present

## 2016-01-31 DIAGNOSIS — M5412 Radiculopathy, cervical region: Secondary | ICD-10-CM | POA: Diagnosis not present

## 2016-01-31 DIAGNOSIS — R29700 NIHSS score 0: Secondary | ICD-10-CM | POA: Diagnosis present

## 2016-01-31 DIAGNOSIS — R200 Anesthesia of skin: Secondary | ICD-10-CM | POA: Insufficient documentation

## 2016-01-31 DIAGNOSIS — R29818 Other symptoms and signs involving the nervous system: Secondary | ICD-10-CM | POA: Diagnosis not present

## 2016-01-31 DIAGNOSIS — G451 Carotid artery syndrome (hemispheric): Secondary | ICD-10-CM

## 2016-01-31 DIAGNOSIS — G459 Transient cerebral ischemic attack, unspecified: Secondary | ICD-10-CM

## 2016-01-31 DIAGNOSIS — I6521 Occlusion and stenosis of right carotid artery: Secondary | ICD-10-CM | POA: Diagnosis not present

## 2016-01-31 DIAGNOSIS — G458 Other transient cerebral ischemic attacks and related syndromes: Secondary | ICD-10-CM | POA: Diagnosis not present

## 2016-01-31 DIAGNOSIS — I63032 Cerebral infarction due to thrombosis of left carotid artery: Secondary | ICD-10-CM | POA: Diagnosis not present

## 2016-01-31 DIAGNOSIS — N289 Disorder of kidney and ureter, unspecified: Secondary | ICD-10-CM | POA: Diagnosis present

## 2016-01-31 DIAGNOSIS — I2583 Coronary atherosclerosis due to lipid rich plaque: Secondary | ICD-10-CM | POA: Diagnosis not present

## 2016-01-31 DIAGNOSIS — Z7982 Long term (current) use of aspirin: Secondary | ICD-10-CM | POA: Diagnosis not present

## 2016-01-31 DIAGNOSIS — D751 Secondary polycythemia: Secondary | ICD-10-CM | POA: Diagnosis not present

## 2016-01-31 DIAGNOSIS — R931 Abnormal findings on diagnostic imaging of heart and coronary circulation: Secondary | ICD-10-CM | POA: Diagnosis not present

## 2016-01-31 DIAGNOSIS — R21 Rash and other nonspecific skin eruption: Secondary | ICD-10-CM

## 2016-01-31 DIAGNOSIS — I638 Other cerebral infarction: Secondary | ICD-10-CM | POA: Diagnosis not present

## 2016-01-31 DIAGNOSIS — Z8249 Family history of ischemic heart disease and other diseases of the circulatory system: Secondary | ICD-10-CM | POA: Diagnosis not present

## 2016-01-31 DIAGNOSIS — R208 Other disturbances of skin sensation: Secondary | ICD-10-CM

## 2016-01-31 DIAGNOSIS — I77819 Aortic ectasia, unspecified site: Secondary | ICD-10-CM | POA: Diagnosis present

## 2016-01-31 DIAGNOSIS — I2510 Atherosclerotic heart disease of native coronary artery without angina pectoris: Secondary | ICD-10-CM | POA: Diagnosis not present

## 2016-01-31 DIAGNOSIS — R9439 Abnormal result of other cardiovascular function study: Secondary | ICD-10-CM | POA: Diagnosis not present

## 2016-01-31 DIAGNOSIS — R531 Weakness: Secondary | ICD-10-CM | POA: Diagnosis present

## 2016-01-31 DIAGNOSIS — E785 Hyperlipidemia, unspecified: Secondary | ICD-10-CM | POA: Diagnosis not present

## 2016-01-31 DIAGNOSIS — J9811 Atelectasis: Secondary | ICD-10-CM | POA: Diagnosis not present

## 2016-01-31 DIAGNOSIS — I350 Nonrheumatic aortic (valve) stenosis: Secondary | ICD-10-CM | POA: Diagnosis not present

## 2016-01-31 DIAGNOSIS — D582 Other hemoglobinopathies: Secondary | ICD-10-CM | POA: Diagnosis not present

## 2016-01-31 DIAGNOSIS — Z87891 Personal history of nicotine dependence: Secondary | ICD-10-CM | POA: Diagnosis not present

## 2016-01-31 DIAGNOSIS — R0602 Shortness of breath: Secondary | ICD-10-CM | POA: Diagnosis not present

## 2016-01-31 DIAGNOSIS — N141 Nephropathy induced by other drugs, medicaments and biological substances: Secondary | ICD-10-CM | POA: Diagnosis not present

## 2016-01-31 DIAGNOSIS — Z8673 Personal history of transient ischemic attack (TIA), and cerebral infarction without residual deficits: Secondary | ICD-10-CM | POA: Diagnosis present

## 2016-01-31 DIAGNOSIS — T45525A Adverse effect of antithrombotic drugs, initial encounter: Secondary | ICD-10-CM | POA: Diagnosis not present

## 2016-01-31 DIAGNOSIS — I63031 Cerebral infarction due to thrombosis of right carotid artery: Secondary | ICD-10-CM | POA: Diagnosis not present

## 2016-01-31 DIAGNOSIS — I252 Old myocardial infarction: Secondary | ICD-10-CM | POA: Diagnosis not present

## 2016-01-31 DIAGNOSIS — I63231 Cerebral infarction due to unspecified occlusion or stenosis of right carotid arteries: Secondary | ICD-10-CM | POA: Diagnosis not present

## 2016-01-31 DIAGNOSIS — Z955 Presence of coronary angioplasty implant and graft: Secondary | ICD-10-CM | POA: Diagnosis not present

## 2016-01-31 LAB — LIPID PANEL
Cholesterol: 142 mg/dL (ref 0–200)
HDL: 39 mg/dL — ABNORMAL LOW (ref >40)
LDL Cholesterol: 89 mg/dL (ref 0–99)
Total CHOL/HDL Ratio: 3.6 RATIO
Triglycerides: 68 mg/dL (ref <150)
VLDL: 14 mg/dL (ref 0–40)

## 2016-01-31 LAB — CBC
HCT: 49.1 % (ref 39.0–52.0)
Hemoglobin: 16.7 g/dL (ref 13.0–17.0)
MCH: 31.5 pg (ref 26.0–34.0)
MCHC: 34 g/dL (ref 30.0–36.0)
MCV: 92.5 fL (ref 78.0–100.0)
Platelets: 164 10*3/uL (ref 150–400)
RBC: 5.31 MIL/uL (ref 4.22–5.81)
RDW: 13.8 % (ref 11.5–15.5)
WBC: 10.5 10*3/uL (ref 4.0–10.5)

## 2016-01-31 LAB — ECHOCARDIOGRAM COMPLETE
Height: 60 in
Weight: 2400 oz

## 2016-01-31 MED ORDER — SODIUM CHLORIDE 0.9 % IV BOLUS (SEPSIS)
500.0000 mL | Freq: Once | INTRAVENOUS | Status: AC
  Administered 2016-01-31: 500 mL via INTRAVENOUS

## 2016-01-31 MED ORDER — CLOPIDOGREL BISULFATE 75 MG PO TABS
75.0000 mg | ORAL_TABLET | Freq: Every day | ORAL | Status: DC
  Administered 2016-01-31 – 2016-02-01 (×2): 75 mg via ORAL
  Filled 2016-01-31 (×2): qty 1

## 2016-01-31 MED ORDER — IOPAMIDOL (ISOVUE-370) INJECTION 76%
INTRAVENOUS | Status: AC
  Administered 2016-01-31: 50 mL
  Filled 2016-01-31: qty 50

## 2016-01-31 MED ORDER — ENOXAPARIN SODIUM 40 MG/0.4ML ~~LOC~~ SOLN
40.0000 mg | Freq: Every day | SUBCUTANEOUS | Status: DC
  Administered 2016-01-31 – 2016-02-03 (×4): 40 mg via SUBCUTANEOUS
  Filled 2016-01-31 (×4): qty 0.4

## 2016-01-31 NOTE — Progress Notes (Addendum)
STROKE TEAM PROGRESS NOTE   HISTORY OF PRESENT ILLNESS (per record) This is an 82 year old right-handed man who presents to the emergency department at the urging of his children to be evaluated for an episode of left arm numbness. The patient reports that he has been in his usual good state of health until today. This morning he was sitting in his recliner when he noticed the abrupt onset of numbness in his left arm. He states that his left hand felt tingly but the rest of his arm was "dead." He thinks he also had some weakness and states that he could move his arm but could not use his hand to manipulate things. This was accompanied by some nausea. He estimates that his symptoms lasted 15-20 minutes and then completely resolved. He denies any associated vision loss, double vision, hearing changes, difficulty swallowing, coordination problems, balance problems, or changes in his gait. There was no associated headache. He was alone and did not try to speak so he is unsure if there were any changes with his language or speech. He has not had any similar symptoms in the past and denies any history of transient neurologic deficits.  The patient reports that he has a history of 2 heart attacks in 1988 and 1990. He denies any other health problems but does not see a physician. He states that he takes no medications apart from aspirin 325 mg every other day.  Patient was not administered IV t-PA secondary to resolved symptoms. He was admitted for further evaluation and treatment.   SUBJECTIVE (INTERVAL HISTORY) Pt son at bedside. Pt wants to go home but OK to stay for stroke work up. Still in ER not up to floor yet. Had CTA head and neck done showed right ICA stenosis. Needs vascular surgery consult. Symptoms resolved.    OBJECTIVE Temp:  [97.8 F (36.6 C)-98.3 F (36.8 C)] 97.8 F (36.6 C) (08/17 0039) Pulse Rate:  [53-116] 100 (08/17 1318) Cardiac Rhythm: Normal sinus rhythm (08/16 1959) Resp:   [12-29] 17 (08/17 1318) BP: (98-156)/(63-108) 156/99 (08/17 1317) SpO2:  [89 %-95 %] 95 % (08/17 1318) FiO2 (%):  [0 %] 0 % (08/16 2238) Weight:  [68 kg (150 lb)] 68 kg (150 lb) (08/16 1617)  CBC:  Recent Labs Lab 01/30/16 1604 01/30/16 1632 01/31/16 0434  WBC 10.6*  --  10.5  NEUTROABS 6.2  --   --   HGB 18.3* 19.0* 16.7  HCT 53.7* 56.0* 49.1  MCV 93.1  --  92.5  PLT 184  --  123456    Basic Metabolic Panel:  Recent Labs Lab 01/30/16 1604 01/30/16 1632  NA 138 141  K 4.9 4.6  CL 107 105  CO2 24  --   GLUCOSE 140* 134*  BUN 13 17  CREATININE 1.20 1.00  CALCIUM 9.2  --     Lipid Panel:    Component Value Date/Time   CHOL 142 01/31/2016 0434   TRIG 68 01/31/2016 0434   HDL 39 (L) 01/31/2016 0434   CHOLHDL 3.6 01/31/2016 0434   VLDL 14 01/31/2016 0434   LDLCALC 89 01/31/2016 0434   HgbA1c: No results found for: HGBA1C Urine Drug Screen: No results found for: LABOPIA, COCAINSCRNUR, LABBENZ, AMPHETMU, THCU, LABBARB    IMAGING I have personally reviewed the radiological images below and agree with the radiology interpretations.  Dg Chest 2 View 01/31/2016 Mild left basilar atelectasis noted. Mild peribronchial thickening seen. Emphysematous change noted at the lung apices.   Ct Head  Wo Contrast 01/30/2016 No acute finding. Generalized atrophy. 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   Mr Brain Wo Contrast 01/30/2016 1. Punctate focus of acute infarction in the right parietal lobe adjacent region of prior chronic infarction. 2. Background of mild chronic microvascular ischemic disease, parenchymal volume loss, and chronic infarcts in the right parietal lobe and left cerebellar hemisphere.   2D Echocardiogram  - Left ventricle: The cavity size was normal. There was moderate concentric hypertrophy. Systolic function was normal. Wall motion was normal; there were no regional wall motion abnormalities. Doppler parameters are consistent with a reversible  restrictive pattern, indicative of decreased left ventricular diastolic compliance and/or increased left atrial pressure (grade 3 diastolic dysfunction). - Aortic valve: Transvalvular velocity was increased. There was mild stenosis. Mean gradient (S): 19 mm Hg. Valve area (VTI): 0.97 cm^2. Valve area (Vmax): 0.98 cm^2. Valve area (Vmean): 0.74 cm^2. - Mitral valve: Transvalvular velocity was within the normal range. There was no evidence for stenosis. There was no regurgitation. Right ventricle: The cavity size was normal. Wall thickness was normal. Systolic function was normal. - Tricuspid valve: There was no regurgitation.  Ct Angio Neck and head W Or Wo Contrast 01/31/2016  IMPRESSION: CTA NECK: 65-70% stenosis RIGHT internal carotid artery origin. Less than 50% stenosis LEFT internal carotid artery origin. 4 cm ascending aorta. Recommend annual imaging followup by CTA or MRA. This recommendation follows 2010 ACCF/AHA/AATS/ACR/ASA/SCA/SCAI/SIR/STS/SVM Guidelines for the Diagnosis and Management of Patients with Thoracic Aortic Disease. Circulation. 2010; 121: [id number] Severe RIGHT C2-3 and severe LEFT C5-6 neural foraminal narrowing. Moderate to severe C5-6 canal stenosis. CTA HEAD: No emergent large vessel occlusion or high-grade stenosis. Approximately 50% stenosis RIGHT anterior genu of the internal carotid artery. Moderate stenosis RIGHT P1 origin with complete circle of Willis.    PHYSICAL EXAM  Temp:  [97.8 F (36.6 C)-98.2 F (36.8 C)] 97.8 F (36.6 C) (08/17 0039) Pulse Rate:  [53-116] 113 (08/17 1600) Resp:  [12-32] 32 (08/17 1600) BP: (98-156)/(63-99) 114/96 (08/17 1600) SpO2:  [89 %-95 %] 92 % (08/17 1600) FiO2 (%):  [0 %] 0 % (08/16 2238)  General - Well nourished, well developed, in no apparent distress.  Ophthalmologic - Sharp disc margins OU.   Cardiovascular - Regular rate and rhythm.  Mental Status -  Level of arousal and orientation to time, place, and person were  intact. Language including expression, naming, repetition, comprehension was assessed and found intact. Fund of Knowledge was assessed and was intact.  Cranial Nerves II - XII - II - Visual field intact OU. III, IV, VI - Extraocular movements intact. V - Facial sensation intact bilaterally. VII - Facial movement intact bilaterally. VIII - Hearing & vestibular intact bilaterally. X - Palate elevates symmetrically. XI - Chin turning & shoulder shrug intact bilaterally. XII - Tongue protrusion intact.  Motor Strength - The patient's strength was normal in all extremities and pronator drift was absent.  Bulk was normal and fasciculations were absent.   Motor Tone - Muscle tone was assessed at the neck and appendages and was normal.  Reflexes - The patient's reflexes were 1+ in all extremities and he had no pathological reflexes.  Sensory - Light touch, temperature/pinprick were assessed and were symmetrical.    Coordination - The patient had normal movements in the hands and feet with no ataxia or dysmetria.  Tremor was absent.  Gait and Station - deferred.   ASSESSMENT/PLAN Mr. David Bradshaw is a 82 y.o. male with history of CAD  s/p MI presenting with transient L arm numbness. He did not receive IV t-PA due to symptoms resolution.   Stroke:  Punctate right parietal lobe infarct embolic secondary to right ICA high grade stenosis  MRI  R parietal lobe infarct  CTA head and neck right ICA 65-70% stenosis, left ICA athero  2D Echo  No source of embolus  LE venous doppler - negative for DVT  LDL 89  HgbA1c pending  Lovenox 40 mg sq daily for VTE prophylaxis  Diet Heart Room service appropriate? Yes; Fluid consistency: Thin  Aspirin 325 mg every other day prior to admission, now on aspirin 325 mg daily. Recommend DAPT for about 3 months and then plavix alone.   Patient counseled to be compliant with his antithrombotic medications  Ongoing aggressive stroke risk factor  management  Therapy recommendations:  pending   Disposition:  pending  (Lives alone PTA)  Right ICA high grade stenosis  Right ICA 65-70% stenosis  Likely the cause of right MCA infarcts  Recommend vascular surgery consultation  Hyperlipidemia  Home meds:  No statin  LDL 89, goal < 70  Add lipitor 40mg    Continue statin at discharge  Other Stroke Risk Factors  Advanced age  Former Cigarette smoker, quit 30 years ago  CAD/MI s/p PCI x 2 in 1988 and 1990  Overweight, Body mass index is 29.29 kg/m., recommend weight loss, diet and exercise as appropriate   Other Active Problems  Cervical radiculopathy  Hospital day # 0  Rosalin Hawking, MD PhD Stroke Neurology 01/31/2016 4:53 PM   To contact Stroke Continuity provider, please refer to [email or web address] After hours, contact General Neurology

## 2016-01-31 NOTE — Progress Notes (Signed)
EEG completed; results pending.    

## 2016-01-31 NOTE — ED Notes (Signed)
Patient still off the floor.

## 2016-01-31 NOTE — ED Notes (Signed)
Pt OOB to use bathroom without assistance at this time.

## 2016-01-31 NOTE — ED Notes (Signed)
Admitting at bedside 

## 2016-01-31 NOTE — ED Notes (Signed)
Patient transferred to Vascular via stretcher.

## 2016-01-31 NOTE — ED Notes (Signed)
Patient now returned to room from MRI.

## 2016-01-31 NOTE — ED Notes (Signed)
Patient transported to X-ray 

## 2016-01-31 NOTE — ED Notes (Signed)
Patient eating lunch.

## 2016-01-31 NOTE — Procedures (Signed)
ELECTROENCEPHALOGRAM REPORT  Date of Study: 01/31/2016  Patient's Name: David Bradshaw MRN: [id number] Date of Birth: 12/05/1926  Referring Provider: Dr. Melba Coon  Clinical History: This is an 82 year old man with an episode of left arm numbness and weakness.  Medications: aspirin tablet 325 mg  atorvastatin (LIPITOR) tablet 40 mg   Technical Summary: A multichannel digital EEG recording measured by the international 10-20 system with electrodes applied with paste and impedances below 5000 ohms performed in our laboratory with EKG monitoring in an awake and drowsy patient.  Hyperventilation and photic stimulation were not performed.  The digital EEG was referentially recorded, reformatted, and digitally filtered in a variety of bipolar and referential montages for optimal display.    Description: The patient is awake and drowsy during the recording.  During maximal wakefulness, there is a symmetric, medium voltage 10 Hz posterior dominant rhythm that attenuates with eye opening.  The record is symmetric.  During drowsiness, there is an increase in theta slowing of the background. Deeper stages of sleep were not seen. Hyperventilation and photic stimulation were not performed.  There were no epileptiform discharges or electrographic seizures seen.    EKG lead showed sinus tachycardia.  Impression: This awake and drowsy EEG is normal.    Clinical Correlation: A normal EEG does not exclude a clinical diagnosis of epilepsy.  Clinical correlation is advised.   Ellouise Newer, M.D.

## 2016-01-31 NOTE — Progress Notes (Signed)
PROGRESS NOTE                                                                                                                                                                                                             Patient Demographics:    David Bradshaw, is a 82 y.o. male, DOB - 1926/11/29, [id number]  Admit date - 01/30/2016   Admitting Physician No admitting provider for patient encounter.  Outpatient Primary MD for the patient is No PCP Per Patient  LOS - 0   Chief Complaint  Patient presents with  . Extremity Weakness       Brief Narrative   82 year old male who presents to ED with left arm numbness, workup significant for acute CVA   Subjective:    David Bradshaw today has, No headache, No chest pain, No abdominal pain - No Nausea, No new weakness tingling or numbness, No Cough - SOB.    Assessment  & Plan :    Principal Problem:   TIA (transient ischemic attack) Active Problems:   Coronary arteriosclerosis due to lipid rich plaque   Elevated hemoglobin (HCC)   CVA (cerebral infarction)   Acute ischemic CVA - MRI with evidence of punctate right parietal lobe infarct, 2-D echo with no embolic source,, CTA head and neck significant for ICA stenosis 65-70%, discussed with Dr. Erlinda Hong from neurology, who recommends vascular surgery consult. - Lower extremity venous Doppler with no evidence of DVT - Continue with aspirin 325 mg daily  Right ICA high-grade stenosis - Vascular surgery consulted  Hyperlipidemia - LDL 89, started on Lipitor started on Lipitor        Code Status : Full  Family Communication  : None at [place]  Disposition Plan  : pending PT  Consults  :  neuro  Procedures  : none  DVT Prophylaxis  :  Lovenox  Lab Results  Component Value Date   PLT 164 01/31/2016    Antibiotics  :    Anti-infectives    None        Objective:   Vitals:   01/31/16 1318 01/31/16 1500 01/31/16 1515 01/31/16  1600  BP:  130/97  114/96  Pulse: 100 104 102 113  Resp: 17 25 (!) 29 (!) 32  Temp:      TempSrc:      SpO2: 95%  93% 94% 92%  Weight:      Height:        Wt Readings from Last 3 Encounters:  01/30/16 68 kg (150 lb)    No intake or output data in the 24 hours ending 01/31/16 1645   Physical Exam  Awake Alert, Oriented X 3, No new F.N deficits, Normal affect [place].AT,PERRAL Supple Neck,No JVD, No cervical lymphadenopathy appriciated.  Symmetrical Chest wall movement, Good air movement bilaterally, CTAB RRR,No Gallops,Rubs or new Murmurs, No Parasternal Heave +ve B.Sounds, Abd Soft, No tenderness, No organomegaly appriciated, No rebound - guarding or rigidity. No Cyanosis, Clubbing or edema, No new Rash or bruise     Data Review:    CBC  Recent Labs Lab 01/30/16 1604 01/30/16 1632 01/31/16 0434  WBC 10.6*  --  10.5  HGB 18.3* 19.0* 16.7  HCT 53.7* 56.0* 49.1  PLT 184  --  164  MCV 93.1  --  92.5  MCH 31.7  --  31.5  MCHC 34.1  --  34.0  RDW 13.7  --  13.8  LYMPHSABS 3.4  --   --   MONOABS 0.9  --   --   EOSABS 0.1  --   --   BASOSABS 0.1  --   --     Chemistries   Recent Labs Lab 01/30/16 1604 01/30/16 1632  NA 138 141  K 4.9 4.6  CL 107 105  CO2 24  --   GLUCOSE 140* 134*  BUN 13 17  CREATININE 1.20 1.00  CALCIUM 9.2  --   AST 43*  --   ALT 30  --   ALKPHOS 87  --   BILITOT 1.0  --    ------------------------------------------------------------------------------------------------------------------  Recent Labs  01/31/16 0434  CHOL 142  HDL 39*  LDLCALC 89  TRIG 68  CHOLHDL 3.6    No results found for: HGBA1C ------------------------------------------------------------------------------------------------------------------ No results for input(s): TSH, T4TOTAL, T3FREE, THYROIDAB in the last 72 hours.  Invalid input(s):  FREET3 ------------------------------------------------------------------------------------------------------------------ No results for input(s): VITAMINB12, FOLATE, FERRITIN, TIBC, IRON, RETICCTPCT in the last 72 hours.  Coagulation profile  Recent Labs Lab 01/30/16 1604  INR 1.04    No results for input(s): DDIMER in the last 72 hours.  Cardiac Enzymes No results for input(s): CKMB, TROPONINI, MYOGLOBIN in the last 168 hours.  Invalid input(s): CK ------------------------------------------------------------------------------------------------------------------ No results found for: BNP  Inpatient Medications  Scheduled Meds: .  stroke: mapping our early stages of recovery book   Does not apply Once  . aspirin  325 mg Oral Daily  . atorvastatin  40 mg Oral q1800  . enoxaparin (LOVENOX) injection  40 mg Subcutaneous Daily   Continuous Infusions:  PRN Meds:.  Micro Results No results found for this or any previous visit (from the past 240 hour(s)).  Radiology Reports Ct Angio Head W Or Wo Contrast  Result Date: 01/31/2016 CLINICAL DATA:  Code stroke, LEFT arm weakness now resolved. History of atrial fibrillation. EXAM: CT ANGIOGRAPHY HEAD AND NECK TECHNIQUE: Multidetector CT imaging of the head and neck was performed using the standard protocol during bolus administration of intravenous contrast. Multiplanar CT image reconstructions and MIPs were obtained to evaluate the vascular anatomy. Carotid stenosis measurements (when applicable) are obtained utilizing NASCET criteria, using the distal internal carotid diameter as the denominator. CONTRAST:  50 cc Isovue 370 COMPARISON:  MRI of the brain January 30, 2016 FINDINGS: CTA NECK AORTIC ARCH: 4 cm ascending aorta with mild calcific atherosclerosis. The origins of  the innominate, left Common carotid artery and subclavian artery are widely patent. Common origin of the innominate and LEFT subclavian artery. RIGHT CAROTID SYSTEM:  Common carotid artery is widely patent, coursing in a straight line fashion. Eccentric intimal thickening and to lesser extent calcific atherosclerosis results in 9 mm segment 65-70% stenosis RIGHT internal carotid artery within 1 cm of the origin. LEFT CAROTID SYSTEM: Common carotid artery is widely patent, coursing in a straight line fashion. Normal appearance of the carotid bifurcation without hemodynamically significant stenosis by NASCET criteria. Eccentric intimal thickening results in less than 50% stenosis LEFT internal carotid artery origin. VERTEBRAL ARTERIES:Codominant vertebral artery's. Normal appearance of the vertebral arteries, which appear widely patent. Mild extrinsic deformity due to degenerative cervical spine. SKELETON: No acute osseous process though bone windows have not been submitted. Patient is edentulous. Grade 1 C2-3 anterolisthesis associated with severe RIGHT facet arthropathy. Severe C4-5 through C6-7 degenerative discs. Severe RIGHT C2-3, moderate to severe bilateral C3-4, severe LEFT C5-6 neural foraminal narrowing. Moderate to severe C5-6 canal stenosis. Broad levoscoliosis. OTHER NECK: Soft tissues of the neck are non-acute though, not tailored for evaluation. Included view of the lung apices demonstrates severe centrilobular emphysema in LEFT apical bullous changes. No superior mediastinal lymphadenopathy. Mild medial deviation of RIGHT true vocal cord, RIGHTcricoarytenoid cartilage can be seen with vocal cord paralysis. CTA HEAD ANTERIOR CIRCULATION: Bilateral internal carotid arteries are patent. Intimal thickening results in focal at least 50% stenosis RIGHT anterior genu of the internal carotid artery. Mild calcific atherosclerosis of the carotid siphons. Developmentally small RIGHT A1 segment. Normal appearance the bilateral anterior cerebral arteries. Patent anterior communicating artery. Normal appearance the bilateral middle cerebral arteries. No large vessel occlusion,  hemodynamically significant stenosis, dissection, luminal irregularity, contrast extravasation or aneurysm. POSTERIOR CIRCULATION: Normal appearance of the vertebral arteries, vertebrobasilar junction and basilar artery, as well as main branch vessels. Moderate stenosis RIGHT P1 origin. Otherwise appearance of the posterior cerebral arteries. Robust bilateral posterior communicating arteries present. No large vessel occlusion, dissection, luminal irregularity, contrast extravasation or aneurysm. VENOUS SINUSES: Major dural venous sinuses are patent though not tailored for evaluation on this angiographic examination. ANATOMIC VARIANTS: None. DELAYED PHASE: No abnormal intracranial enhancement. IMPRESSION: CTA NECK: 65-70% stenosis RIGHT internal carotid artery origin. Less than 50% stenosis LEFT internal carotid artery origin. 4 cm ascending aorta. Recommend annual imaging followup by CTA or MRA. This recommendation follows 2010 ACCF/AHA/AATS/ACR/ASA/SCA/SCAI/SIR/STS/SVM Guidelines for the Diagnosis and Management of Patients with Thoracic Aortic Disease. Circulation. 2010; 121: [id number] Severe RIGHT C2-3 and severe LEFT C5-6 neural foraminal narrowing. Moderate to severe C5-6 canal stenosis. CTA HEAD: No emergent large vessel occlusion or high-grade stenosis. Approximately 50% stenosis RIGHT anterior genu of the internal carotid artery. Moderate stenosis RIGHT P1 origin with complete circle of Willis. Electronically Signed   By: Elon Alas M.D.   On: 01/31/2016 14:13   Dg Chest 2 View  Result Date: 01/31/2016 CLINICAL DATA:  Acute onset of left-sided weakness. Initial encounter. EXAM: CHEST  2 VIEW COMPARISON:  Chest radiograph performed 11/01/2007 FINDINGS: The lungs are well-aerated. Mild left basilar atelectasis is noted. Mild peribronchial thickening is seen. There is no evidence of pleural effusion or pneumothorax. Emphysematous change is noted at the lung apices. The heart is borderline normal in  size. No acute osseous abnormalities are seen. IMPRESSION: Mild left basilar atelectasis noted. Mild peribronchial thickening seen. Emphysematous change noted at the lung apices. Electronically Signed   By: Garald Balding M.D.   On: 01/31/2016 00:46  Ct Head Wo Contrast  Result Date: 01/30/2016 CLINICAL DATA:  Left arm numbness and nausea up earlier today which has result. EXAM: CT HEAD WITHOUT CONTRAST TECHNIQUE: Contiguous axial images were obtained from the base of the skull through the vertex without intravenous contrast. COMPARISON:  None. FINDINGS: Brain: Generalized atrophy. The brainstem is normal. There may be an old small vessel left cerebellar infarction. Cerebral hemispheres show an old infarction in the right parietal cortical and subcortical brain. No sign of acute infarction, intra-axial mass lesion, hemorrhage, hydrocephalus or extra-axial collection. 7 mm left frontal dural calcification could represent a small calcified meningioma, not significant. Vascular: There is atherosclerotic calcification of the major vessels at the base of the brain. Skull: Normal Sinuses/Orbits: Clear Other: None IMPRESSION: No acute finding. Generalized atrophy. 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 Electronically Signed   By: Nelson Chimes M.D.   On: 01/30/2016 16:54   Ct Angio Neck W Or Wo Contrast  Result Date: 01/31/2016 CLINICAL DATA:  Code stroke, LEFT arm weakness now resolved. History of atrial fibrillation. EXAM: CT ANGIOGRAPHY HEAD AND NECK TECHNIQUE: Multidetector CT imaging of the head and neck was performed using the standard protocol during bolus administration of intravenous contrast. Multiplanar CT image reconstructions and MIPs were obtained to evaluate the vascular anatomy. Carotid stenosis measurements (when applicable) are obtained utilizing NASCET criteria, using the distal internal carotid diameter as the denominator. CONTRAST:  50 cc Isovue 370 COMPARISON:  MRI of the  brain January 30, 2016 FINDINGS: CTA NECK AORTIC ARCH: 4 cm ascending aorta with mild calcific atherosclerosis. The origins of the innominate, left Common carotid artery and subclavian artery are widely patent. Common origin of the innominate and LEFT subclavian artery. RIGHT CAROTID SYSTEM: Common carotid artery is widely patent, coursing in a straight line fashion. Eccentric intimal thickening and to lesser extent calcific atherosclerosis results in 9 mm segment 65-70% stenosis RIGHT internal carotid artery within 1 cm of the origin. LEFT CAROTID SYSTEM: Common carotid artery is widely patent, coursing in a straight line fashion. Normal appearance of the carotid bifurcation without hemodynamically significant stenosis by NASCET criteria. Eccentric intimal thickening results in less than 50% stenosis LEFT internal carotid artery origin. VERTEBRAL ARTERIES:Codominant vertebral artery's. Normal appearance of the vertebral arteries, which appear widely patent. Mild extrinsic deformity due to degenerative cervical spine. SKELETON: No acute osseous process though bone windows have not been submitted. Patient is edentulous. Grade 1 C2-3 anterolisthesis associated with severe RIGHT facet arthropathy. Severe C4-5 through C6-7 degenerative discs. Severe RIGHT C2-3, moderate to severe bilateral C3-4, severe LEFT C5-6 neural foraminal narrowing. Moderate to severe C5-6 canal stenosis. Broad levoscoliosis. OTHER NECK: Soft tissues of the neck are non-acute though, not tailored for evaluation. Included view of the lung apices demonstrates severe centrilobular emphysema in LEFT apical bullous changes. No superior mediastinal lymphadenopathy. Mild medial deviation of RIGHT true vocal cord, RIGHTcricoarytenoid cartilage can be seen with vocal cord paralysis. CTA HEAD ANTERIOR CIRCULATION: Bilateral internal carotid arteries are patent. Intimal thickening results in focal at least 50% stenosis RIGHT anterior genu of the internal  carotid artery. Mild calcific atherosclerosis of the carotid siphons. Developmentally small RIGHT A1 segment. Normal appearance the bilateral anterior cerebral arteries. Patent anterior communicating artery. Normal appearance the bilateral middle cerebral arteries. No large vessel occlusion, hemodynamically significant stenosis, dissection, luminal irregularity, contrast extravasation or aneurysm. POSTERIOR CIRCULATION: Normal appearance of the vertebral arteries, vertebrobasilar junction and basilar artery, as well as main branch vessels. Moderate stenosis RIGHT P1 origin. Otherwise appearance  of the posterior cerebral arteries. Robust bilateral posterior communicating arteries present. No large vessel occlusion, dissection, luminal irregularity, contrast extravasation or aneurysm. VENOUS SINUSES: Major dural venous sinuses are patent though not tailored for evaluation on this angiographic examination. ANATOMIC VARIANTS: None. DELAYED PHASE: No abnormal intracranial enhancement. IMPRESSION: CTA NECK: 65-70% stenosis RIGHT internal carotid artery origin. Less than 50% stenosis LEFT internal carotid artery origin. 4 cm ascending aorta. Recommend annual imaging followup by CTA or MRA. This recommendation follows 2010 ACCF/AHA/AATS/ACR/ASA/SCA/SCAI/SIR/STS/SVM Guidelines for the Diagnosis and Management of Patients with Thoracic Aortic Disease. Circulation. 2010; 121: [id number] Severe RIGHT C2-3 and severe LEFT C5-6 neural foraminal narrowing. Moderate to severe C5-6 canal stenosis. CTA HEAD: No emergent large vessel occlusion or high-grade stenosis. Approximately 50% stenosis RIGHT anterior genu of the internal carotid artery. Moderate stenosis RIGHT P1 origin with complete circle of Willis. Electronically Signed   By: Elon Alas M.D.   On: 01/31/2016 14:13   Mr Brain Wo Contrast  Result Date: 01/30/2016 CLINICAL DATA:  82 y/o M; transient weakness and loss of sensation within the left arm. EXAM: MRI HEAD  WITHOUT CONTRAST TECHNIQUE: Multiplanar, multiecho pulse sequences of the brain and surrounding structures were obtained without intravenous contrast. COMPARISON:  Head CT dated 01/30/2016. FINDINGS: Brain: There is diffusion hyperintensity without low ADC in region of gliosis from prior infarct in the right parietal lobe consistent with T2 shine through. A single punctate focus in the right parietal lobe may demonstrate low ADC an represent an acute area of infarction (series 4, image 34). Mild hemosiderin staining of the right parietal infarct. No additional focus of susceptibility hypointensity to indicate intracranial hemorrhage. Background of mild parenchymal volume loss and chronic microvascular ischemic changes. Small chronic infarct in the left cerebellar hemisphere. Extra-axial space: Normal ventricular size. No midline shift. No effacement of basilar cisterns. No extra-axial collection is identified. Proximal intracranial flow voids are maintained. No abnormality of the cervical medullary junction. Other: No abnormal signal of the paranasal sinuses. Partial opacification of the right mastoid air cells. No abnormal signal of left mastoid air cells. Orbits are unremarkable. Calvarium is unremarkable. IMPRESSION: 1. Punctate focus of acute infarction in the right parietal lobe adjacent region of prior chronic infarction. 2. Background of mild chronic microvascular ischemic disease, parenchymal volume loss, and chronic infarcts in the right parietal lobe and left cerebellar hemisphere. These results were called by telephone at the time of interpretation on 01/30/2016 at 11:53 pm to Dr. Leonette Monarch, who verbally acknowledged these results. Electronically Signed   By: Kristine Garbe M.D.   On: 01/30/2016 23:54      Raziah Funnell M.D on 01/31/2016 at 4:45 PM  Between 7am to 7pm - Pager - [phone]  After 7pm go to [email or web address] - password [facility]  [facility] -  Office   [phone]

## 2016-01-31 NOTE — ED Notes (Signed)
Vascular at bedside

## 2016-01-31 NOTE — ED Notes (Signed)
Patient returned to the room placed back on the monitor.

## 2016-01-31 NOTE — Progress Notes (Signed)
*  PRELIMINARY RESULTS* Echocardiogram 2D Echocardiogram has been performed.  [name] 01/31/2016, 9:05 AM

## 2016-01-31 NOTE — Progress Notes (Signed)
*  Preliminary Results* Bilateral lower extremity venous duplex completed. Bilateral lower extremities are negative for deep vein thrombosis. There is no evidence of Baker's cyst bilaterally.  01/31/2016 8:21 AM Maudry Mayhew, BS, RVT, RDCS, RDMS

## 2016-01-31 NOTE — Consult Note (Signed)
Hospital Consult    Reason for Consult:  Symptomatic R carotid stenosis Referring Physician:  [facility] (elger[facility]y) MRN #:  [id number]  History of Present Illness: This is a 82 y.o. male with history o MI x2 treated with angioplasty presented yesterday with a history of left upper extremity weakness and numbness. The symptoms lasted 20 minutes and were without slurring of speech, loc, changes in vision or lower extremity symptoms. He denies history of cva, tia or amaurosis. MRI revealed punctate parietal lobe acute infarcts. He takes full strength aspirin every other day and no other medications. He is able to walk a flight of stairs but is limited by shortness of breath. No chest pain, can lie flat. Former smoker quit 30 years ago.   Past Medical History:  Diagnosis Date  . Coronary arteriosclerosis due to lipid rich plaque 01/30/2016  . Myocardial infarct ([facility])    1988 and 1990    History reviewed. No pertinent surgical history.  No Known Allergies  Prior to Admission medications   Medication Sig Start Date End Date Taking? Authorizing Provider  aspirin EC 325 MG tablet Take 325 mg by mouth every other day.   Yes Historical Provider, MD  aspirin 81 MG tablet Take 81 mg by mouth daily.    Historical Provider, MD    Social History   Social History  . Marital status: Married    Spouse name: N/A  . Number of children: N/A  . Years of education: N/A   Occupational History  . Not on file.   Social History Main Topics  . Smoking status: Former Research scientist (life sciences)  . Smokeless tobacco: Never Used  . Alcohol use No  . Drug use: No  . Sexual activity: Not on file   Other Topics Concern  . Not on file   Social History Narrative  . No narrative on file     Family History  Problem Relation Age of Onset  . Heart attack Mother   . Bone cancer Father     ROS: [x]  Positive   [ ]  Negative   [ ]  All sytems reviewed and are negative   Cardiovascular: []  chest  pain/pressure []  palpitations []  SOB lying flat x[]  DOE []  pain in legs while walking []  pain in legs at rest []  pain in legs at night []  non-healing ulcers []  swelling in legs  Pulmonary: []  productive cough []  asthma/wheezing []  home O2  Neurologic: [x]  weakness in []  arms []  legs (left upper extremity as above) [x]  numbness in []  arms []  legs []  hx of CVA []  mini stroke [] difficulty speaking or slurred speech []  temporary loss of vision in one eye []  dizziness  Hematologic: []  hx of cancer []  bleeding problems []  problems with blood clotting easily  Endocrine:   []  diabetes  GI []  vomiting blood []  blood in stool  GU: [x]  delayed urination []  burning with urination []  blood in urine  Psychiatric: []  anxiety []  depression  Musculoskeletal: []  arthritis []  joint pain   Constitutional: []  fever []  chills []  weight loss   Physical Examination  Vitals:   01/31/16 1600 01/31/16 1700  BP: 114/96 133/85  Pulse: 113 98  Resp:  19  Temp:     Body mass index is 29.29 kg/m.  General:  WDWN in NAD Gait: Not observed HENT: WNL, normocephalic Pulmonary: normal non-labored breathing Cardiac: rrr Abdomen:soft, NT/ND, no masses Skin: with rashes Vascular Exam/Pulses:  Right Left  Radial 2+ (normal) 2+ (normal)  Femoral 2+ (normal)  2+ (normal)  Popliteal 3+ (hyperdynamic) 2+ (normal)  DP absent absent  PT absent absent   Extremities: without ischemic changes, without Gangrene , without cellulitis; without open wounds;  Musculoskeletal: no muscle wasting or atrophy  Neurologic: A&O X 3; Appropriate Affect ; SENSATION: normal; MOTOR FUNCTION:  moving all extremities equally with 5/5 strength   CBC    Component Value Date/Time   WBC 10.5 01/31/2016 0434   RBC 5.31 01/31/2016 0434   HGB 16.7 01/31/2016 0434   HCT 49.1 01/31/2016 0434   PLT 164 01/31/2016 0434   MCV 92.5 01/31/2016 0434   MCH 31.5 01/31/2016 0434   MCHC 34.0 01/31/2016 0434    RDW 13.8 01/31/2016 0434   LYMPHSABS 3.4 01/30/2016 1604   MONOABS 0.9 01/30/2016 1604   EOSABS 0.1 01/30/2016 1604   BASOSABS 0.1 01/30/2016 1604    BMET    Component Value Date/Time   NA 141 01/30/2016 1632   K 4.6 01/30/2016 1632   CL 105 01/30/2016 1632   CO2 24 01/30/2016 1604   GLUCOSE 134 (H) 01/30/2016 1632   BUN 17 01/30/2016 1632   CREATININE 1.00 01/30/2016 1632   CALCIUM 9.2 01/30/2016 1604   GFRNONAA 53 (L) 01/30/2016 1604   GFRAA >60 01/30/2016 1604    COAGS: Lab Results  Component Value Date   INR 1.04 01/30/2016       ASSESSMENT/PLAN: This is a 82 y.o. male here with acute right sided parietal infarct by MRI, symptoms now resolved.  -agree with dual antiplatelet therapy and statin -will require intervention but will get cardiology evaluation given significant coronary history -tentatively planning right carotid endarterectomy vs stent early next week pending cardiac evaluation  Jerita Wimbush C. Donzetta Matters, MD [facility] Office: [phone] Pager: [phone]

## 2016-01-31 NOTE — ED Notes (Signed)
Patient alert and oriented x4 patient ambulates without assistance. Next Neuro check due at 1939. Patient passed his swallow screen. Patient has no complaints at this time.

## 2016-01-31 NOTE — ED Notes (Signed)
Patient returned placed back on the monitor.

## 2016-02-01 DIAGNOSIS — I6529 Occlusion and stenosis of unspecified carotid artery: Secondary | ICD-10-CM

## 2016-02-01 DIAGNOSIS — E785 Hyperlipidemia, unspecified: Secondary | ICD-10-CM

## 2016-02-01 LAB — CBC
HCT: 47 % (ref 39.0–52.0)
Hemoglobin: 15.5 g/dL (ref 13.0–17.0)
MCH: 30.7 pg (ref 26.0–34.0)
MCHC: 33 g/dL (ref 30.0–36.0)
MCV: 93.1 fL (ref 78.0–100.0)
Platelets: 158 10*3/uL (ref 150–400)
RBC: 5.05 MIL/uL (ref 4.22–5.81)
RDW: 13.8 % (ref 11.5–15.5)
WBC: 8.2 10*3/uL (ref 4.0–10.5)

## 2016-02-01 LAB — HEMOGLOBIN A1C
Hgb A1c MFr Bld: 5.8 % — ABNORMAL HIGH (ref 4.8–5.6)
Mean Plasma Glucose: 120 mg/dL

## 2016-02-01 LAB — BASIC METABOLIC PANEL
Anion gap: 8 (ref 5–15)
BUN: 11 mg/dL (ref 6–20)
CO2: 25 mmol/L (ref 22–32)
Calcium: 8.4 mg/dL — ABNORMAL LOW (ref 8.9–10.3)
Chloride: 105 mmol/L (ref 101–111)
Creatinine, Ser: 1.14 mg/dL (ref 0.61–1.24)
GFR calc Af Amer: >60 mL/min (ref >60)
GFR calc non Af Amer: 56 mL/min — ABNORMAL LOW (ref >60)
Glucose, Bld: 97 mg/dL (ref 65–99)
Potassium: 4.2 mmol/L (ref 3.5–5.1)
Sodium: 138 mmol/L (ref 135–145)

## 2016-02-01 MED ORDER — DIPHENHYDRAMINE HCL 25 MG PO CAPS
25.0000 mg | ORAL_CAPSULE | Freq: Once | ORAL | Status: AC
  Administered 2016-02-01: 25 mg via ORAL
  Filled 2016-02-01: qty 1

## 2016-02-01 NOTE — Progress Notes (Signed)
Noted patient's back and abdomen is reddish and a little raise. Seemed like a reaction. But patient denies itching. MD paged to notify.

## 2016-02-01 NOTE — Progress Notes (Signed)
PROGRESS NOTE                                                                                                                                                                                                             Patient Demographics:    David Bradshaw, is a 82 y.o. male, DOB - [birth year], [id number]  Admit date - 01/30/2016   Admitting Physician Albertine Patricia, MD  Outpatient Primary MD for the patient is No PCP Per Patient  LOS - 1   Chief Complaint  Patient presents with  . Extremity Weakness       Brief Narrative   82 year old male who presents to ED with left arm numbness, workup significant for acute CVA, And right ICA stenosis.   Subjective:    [name] today has, No headache, No chest pain, No abdominal pain - No Nausea, No new weakness tingling or numbness, No Cough - SOB.    Assessment  & Plan :    Principal Problem:   TIA (transient ischemic attack) Active Problems:   Coronary arteriosclerosis due to lipid rich plaque   Elevated hemoglobin (HCC)   CVA (cerebral infarction)   Acute ischemic CVA - MRI with evidence of punctate right parietal lobe infarct. -  2-D echo with no embolic source, - CTA head and neck significant for ICA stenosis 65-70%, discussed with Dr. Erlinda Hong from neurology, who recommends vascular surgery consult. - Lower extremity venous Doppler with no evidence of DVT - Continue with dual antiplatelet therapy for 3 month, then Plavix alone. - LDL 89 - Glycohemoglobin A1c 5.8  Right ICA high-grade stenosis - Vascular surgery consulted, and for endarterectomy versus stenting, cardiology consulted for clearance  Hyperlipidemia - LDL 89, started on Lipitor started on Lipitor     Code Status : Full  Family Communication  : None at [place]  Disposition Plan  : Pending decision about endarterectomy   Consults  :  neuro  Procedures  : none  DVT Prophylaxis  :  Lovenox  Lab Results    Component Value Date   PLT 158 02/01/2016    Antibiotics  :    Anti-infectives    None        Objective:   Vitals:   02/01/16 0031 02/01/16 0225 02/01/16 0433 02/01/16 0631  BP: 129/89 114/76 128/88 (!) 131/95  Pulse: 86 86 91 79  Resp: 19 18 17 20   Temp: 98.4 F (36.9 C) 98.8 F (37.1 C) 98.6 F (37 C) 98.6 F (37 C)  TempSrc: Oral Oral Oral Oral  SpO2: 92% 92% 94% 93%  Weight:      Height:        Wt Readings from Last 3 Encounters:  01/30/16 68 kg (150 lb)    No intake or output data in the 24 hours ending 02/01/16 1338   Physical Exam  Awake Alert, Oriented X 3, No new F.N deficits, Normal affect .AT,PERRAL Supple Neck,No JVD, No cervical lymphadenopathy appriciated.  Symmetrical Chest wall movement, Good air movement bilaterally, CTAB RRR,No Gallops,Rubs or new Murmurs, No Parasternal Heave +ve B.Sounds, Abd Soft, No tenderness, No organomegaly appriciated, No rebound - guarding or rigidity. No Cyanosis, Clubbing or edema, No new Rash or bruise     Data Review:    CBC  Recent Labs Lab 01/30/16 1604 01/30/16 1632 01/31/16 0434 02/01/16 0539  WBC 10.6*  --  10.5 8.2  HGB 18.3* 19.0* 16.7 15.5  HCT 53.7* 56.0* 49.1 47.0  PLT 184  --  164 158  MCV 93.1  --  92.5 93.1  MCH 31.7  --  31.5 30.7  MCHC 34.1  --  34.0 33.0  RDW 13.7  --  13.8 13.8  LYMPHSABS 3.4  --   --   --   MONOABS 0.9  --   --   --   EOSABS 0.1  --   --   --   BASOSABS 0.1  --   --   --     Chemistries   Recent Labs Lab 01/30/16 1604 01/30/16 1632 02/01/16 0539  NA 138 141 138  K 4.9 4.6 4.2  CL 107 105 105  CO2 24  --  25  GLUCOSE 140* 134* 97  BUN 13 17 11   CREATININE 1.20 1.00 1.14  CALCIUM 9.2  --  8.4*  AST 43*  --   --   ALT 30  --   --   ALKPHOS 87  --   --   BILITOT 1.0  --   --    ------------------------------------------------------------------------------------------------------------------  Recent Labs  01/31/16 0434  CHOL 142  HDL 39*   LDLCALC 89  TRIG 68  CHOLHDL 3.6    Lab Results  Component Value Date   HGBA1C 5.8 (H) 01/31/2016   ------------------------------------------------------------------------------------------------------------------ No results for input(s): TSH, T4TOTAL, T3FREE, THYROIDAB in the last 72 hours.  Invalid input(s): FREET3 ------------------------------------------------------------------------------------------------------------------ No results for input(s): VITAMINB12, FOLATE, FERRITIN, TIBC, IRON, RETICCTPCT in the last 72 hours.  Coagulation profile  Recent Labs Lab 01/30/16 1604  INR 1.04    No results for input(s): DDIMER in the last 72 hours.  Cardiac Enzymes No results for input(s): CKMB, TROPONINI, MYOGLOBIN in the last 168 hours.  Invalid input(s): CK ------------------------------------------------------------------------------------------------------------------ No results found for: BNP  Inpatient Medications  Scheduled Meds: .  stroke: mapping our early stages of recovery book   Does not apply Once  . aspirin  325 mg Oral Daily  . atorvastatin  40 mg Oral q1800  . clopidogrel  75 mg Oral Daily  . enoxaparin (LOVENOX) injection  40 mg Subcutaneous Daily   Continuous Infusions:  PRN Meds:.  Micro Results No results found for this or any previous visit (from the past 240 hour(s)).  Radiology Reports Ct Angio Head W Or Wo Contrast  Result Date: 01/31/2016 CLINICAL DATA:  Code stroke,  LEFT arm weakness now resolved. History of atrial fibrillation. EXAM: CT ANGIOGRAPHY HEAD AND NECK TECHNIQUE: Multidetector CT imaging of the head and neck was performed using the standard protocol during bolus administration of intravenous contrast. Multiplanar CT image reconstructions and MIPs were obtained to evaluate the vascular anatomy. Carotid stenosis measurements (when applicable) are obtained utilizing NASCET criteria, using the distal internal carotid diameter as the  denominator. CONTRAST:  50 cc Isovue 370 COMPARISON:  MRI of the brain January 30, 2016 FINDINGS: CTA NECK AORTIC ARCH: 4 cm ascending aorta with mild calcific atherosclerosis. The origins of the innominate, left Common carotid artery and subclavian artery are widely patent. Common origin of the innominate and LEFT subclavian artery. RIGHT CAROTID SYSTEM: Common carotid artery is widely patent, coursing in a straight line fashion. Eccentric intimal thickening and to lesser extent calcific atherosclerosis results in 9 mm segment 65-70% stenosis RIGHT internal carotid artery within 1 cm of the origin. LEFT CAROTID SYSTEM: Common carotid artery is widely patent, coursing in a straight line fashion. Normal appearance of the carotid bifurcation without hemodynamically significant stenosis by NASCET criteria. Eccentric intimal thickening results in less than 50% stenosis LEFT internal carotid artery origin. VERTEBRAL ARTERIES:Codominant vertebral artery's. Normal appearance of the vertebral arteries, which appear widely patent. Mild extrinsic deformity due to degenerative cervical spine. SKELETON: No acute osseous process though bone windows have not been submitted. Patient is edentulous. Grade 1 C2-3 anterolisthesis associated with severe RIGHT facet arthropathy. Severe C4-5 through C6-7 degenerative discs. Severe RIGHT C2-3, moderate to severe bilateral C3-4, severe LEFT C5-6 neural foraminal narrowing. Moderate to severe C5-6 canal stenosis. Broad levoscoliosis. OTHER NECK: Soft tissues of the neck are non-acute though, not tailored for evaluation. Included view of the lung apices demonstrates severe centrilobular emphysema in LEFT apical bullous changes. No superior mediastinal lymphadenopathy. Mild medial deviation of RIGHT true vocal cord, RIGHTcricoarytenoid cartilage can be seen with vocal cord paralysis. CTA HEAD ANTERIOR CIRCULATION: Bilateral internal carotid arteries are patent. Intimal thickening results in  focal at least 50% stenosis RIGHT anterior genu of the internal carotid artery. Mild calcific atherosclerosis of the carotid siphons. Developmentally small RIGHT A1 segment. Normal appearance the bilateral anterior cerebral arteries. Patent anterior communicating artery. Normal appearance the bilateral middle cerebral arteries. No large vessel occlusion, hemodynamically significant stenosis, dissection, luminal irregularity, contrast extravasation or aneurysm. POSTERIOR CIRCULATION: Normal appearance of the vertebral arteries, vertebrobasilar junction and basilar artery, as well as main branch vessels. Moderate stenosis RIGHT P1 origin. Otherwise appearance of the posterior cerebral arteries. Robust bilateral posterior communicating arteries present. No large vessel occlusion, dissection, luminal irregularity, contrast extravasation or aneurysm. VENOUS SINUSES: Major dural venous sinuses are patent though not tailored for evaluation on this angiographic examination. ANATOMIC VARIANTS: None. DELAYED PHASE: No abnormal intracranial enhancement. IMPRESSION: CTA NECK: 65-70% stenosis RIGHT internal carotid artery origin. Less than 50% stenosis LEFT internal carotid artery origin. 4 cm ascending aorta. Recommend annual imaging followup by CTA or MRA. This recommendation follows 2010 ACCF/AHA/AATS/ACR/ASA/SCA/SCAI/SIR/STS/SVM Guidelines for the Diagnosis and Management of Patients with Thoracic Aortic Disease. Circulation. 2010; 121: [id number] Severe RIGHT C2-3 and severe LEFT C5-6 neural foraminal narrowing. Moderate to severe C5-6 canal stenosis. CTA HEAD: No emergent large vessel occlusion or high-grade stenosis. Approximately 50% stenosis RIGHT anterior genu of the internal carotid artery. Moderate stenosis RIGHT P1 origin with complete circle of Willis. Electronically Signed   By: Elon Alas M.D.   On: 01/31/2016 14:13   Dg Chest 2 View  Result Date: 01/31/2016 CLINICAL DATA:  Acute onset of left-sided  weakness. Initial encounter. EXAM: CHEST  2 VIEW COMPARISON:  Chest radiograph performed 11/01/2007 FINDINGS: The lungs are well-aerated. Mild left basilar atelectasis is noted. Mild peribronchial thickening is seen. There is no evidence of pleural effusion or pneumothorax. Emphysematous change is noted at the lung apices. The heart is borderline normal in size. No acute osseous abnormalities are seen. IMPRESSION: Mild left basilar atelectasis noted. Mild peribronchial thickening seen. Emphysematous change noted at the lung apices. Electronically Signed   By: Garald Balding M.D.   On: 01/31/2016 00:46   Ct Head Wo Contrast  Result Date: 01/30/2016 CLINICAL DATA:  Left arm numbness and nausea up earlier today which has result. EXAM: CT HEAD WITHOUT CONTRAST TECHNIQUE: Contiguous axial images were obtained from the base of the skull through the vertex without intravenous contrast. COMPARISON:  None. FINDINGS: Brain: Generalized atrophy. The brainstem is normal. There may be an old small vessel left cerebellar infarction. Cerebral hemispheres show an old infarction in the right parietal cortical and subcortical brain. No sign of acute infarction, intra-axial mass lesion, hemorrhage, hydrocephalus or extra-axial collection. 7 mm left frontal dural calcification could represent a small calcified meningioma, not significant. Vascular: There is atherosclerotic calcification of the major vessels at the base of the brain. Skull: Normal Sinuses/Orbits: Clear Other: None IMPRESSION: No acute finding. Generalized atrophy. 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 Electronically Signed   By: Nelson Chimes M.D.   On: 01/30/2016 16:54   Ct Angio Neck W Or Wo Contrast  Result Date: 01/31/2016 CLINICAL DATA:  Code stroke, LEFT arm weakness now resolved. History of atrial fibrillation. EXAM: CT ANGIOGRAPHY HEAD AND NECK TECHNIQUE: Multidetector CT imaging of the head and neck was performed using the standard  protocol during bolus administration of intravenous contrast. Multiplanar CT image reconstructions and MIPs were obtained to evaluate the vascular anatomy. Carotid stenosis measurements (when applicable) are obtained utilizing NASCET criteria, using the distal internal carotid diameter as the denominator. CONTRAST:  50 cc Isovue 370 COMPARISON:  MRI of the brain January 30, 2016 FINDINGS: CTA NECK AORTIC ARCH: 4 cm ascending aorta with mild calcific atherosclerosis. The origins of the innominate, left Common carotid artery and subclavian artery are widely patent. Common origin of the innominate and LEFT subclavian artery. RIGHT CAROTID SYSTEM: Common carotid artery is widely patent, coursing in a straight line fashion. Eccentric intimal thickening and to lesser extent calcific atherosclerosis results in 9 mm segment 65-70% stenosis RIGHT internal carotid artery within 1 cm of the origin. LEFT CAROTID SYSTEM: Common carotid artery is widely patent, coursing in a straight line fashion. Normal appearance of the carotid bifurcation without hemodynamically significant stenosis by NASCET criteria. Eccentric intimal thickening results in less than 50% stenosis LEFT internal carotid artery origin. VERTEBRAL ARTERIES:Codominant vertebral artery's. Normal appearance of the vertebral arteries, which appear widely patent. Mild extrinsic deformity due to degenerative cervical spine. SKELETON: No acute osseous process though bone windows have not been submitted. Patient is edentulous. Grade 1 C2-3 anterolisthesis associated with severe RIGHT facet arthropathy. Severe C4-5 through C6-7 degenerative discs. Severe RIGHT C2-3, moderate to severe bilateral C3-4, severe LEFT C5-6 neural foraminal narrowing. Moderate to severe C5-6 canal stenosis. Broad levoscoliosis. OTHER NECK: Soft tissues of the neck are non-acute though, not tailored for evaluation. Included view of the lung apices demonstrates severe centrilobular emphysema in LEFT  apical bullous changes. No superior mediastinal lymphadenopathy. Mild medial deviation of RIGHT true vocal cord, RIGHTcricoarytenoid cartilage can be seen with vocal cord  paralysis. CTA HEAD ANTERIOR CIRCULATION: Bilateral internal carotid arteries are patent. Intimal thickening results in focal at least 50% stenosis RIGHT anterior genu of the internal carotid artery. Mild calcific atherosclerosis of the carotid siphons. Developmentally small RIGHT A1 segment. Normal appearance the bilateral anterior cerebral arteries. Patent anterior communicating artery. Normal appearance the bilateral middle cerebral arteries. No large vessel occlusion, hemodynamically significant stenosis, dissection, luminal irregularity, contrast extravasation or aneurysm. POSTERIOR CIRCULATION: Normal appearance of the vertebral arteries, vertebrobasilar junction and basilar artery, as well as main branch vessels. Moderate stenosis RIGHT P1 origin. Otherwise appearance of the posterior cerebral arteries. Robust bilateral posterior communicating arteries present. No large vessel occlusion, dissection, luminal irregularity, contrast extravasation or aneurysm. VENOUS SINUSES: Major dural venous sinuses are patent though not tailored for evaluation on this angiographic examination. ANATOMIC VARIANTS: None. DELAYED PHASE: No abnormal intracranial enhancement. IMPRESSION: CTA NECK: 65-70% stenosis RIGHT internal carotid artery origin. Less than 50% stenosis LEFT internal carotid artery origin. 4 cm ascending aorta. Recommend annual imaging followup by CTA or MRA. This recommendation follows 2010 ACCF/AHA/AATS/ACR/ASA/SCA/SCAI/SIR/STS/SVM Guidelines for the Diagnosis and Management of Patients with Thoracic Aortic Disease. Circulation. 2010; 121: [id number] Severe RIGHT C2-3 and severe LEFT C5-6 neural foraminal narrowing. Moderate to severe C5-6 canal stenosis. CTA HEAD: No emergent large vessel occlusion or high-grade stenosis. Approximately 50%  stenosis RIGHT anterior genu of the internal carotid artery. Moderate stenosis RIGHT P1 origin with complete circle of Willis. Electronically Signed   By: Elon Alas M.D.   On: 01/31/2016 14:13   Mr Brain Wo Contrast  Result Date: 01/30/2016 CLINICAL DATA:  82 y/o M; transient weakness and loss of sensation within the left arm. EXAM: MRI HEAD WITHOUT CONTRAST TECHNIQUE: Multiplanar, multiecho pulse sequences of the brain and surrounding structures were obtained without intravenous contrast. COMPARISON:  Head CT dated 01/30/2016. FINDINGS: Brain: There is diffusion hyperintensity without low ADC in region of gliosis from prior infarct in the right parietal lobe consistent with T2 shine through. A single punctate focus in the right parietal lobe may demonstrate low ADC an represent an acute area of infarction (series 4, image 34). Mild hemosiderin staining of the right parietal infarct. No additional focus of susceptibility hypointensity to indicate intracranial hemorrhage. Background of mild parenchymal volume loss and chronic microvascular ischemic changes. Small chronic infarct in the left cerebellar hemisphere. Extra-axial space: Normal ventricular size. No midline shift. No effacement of basilar cisterns. No extra-axial collection is identified. Proximal intracranial flow voids are maintained. No abnormality of the cervical medullary junction. Other: No abnormal signal of the paranasal sinuses. Partial opacification of the right mastoid air cells. No abnormal signal of left mastoid air cells. Orbits are unremarkable. Calvarium is unremarkable. IMPRESSION: 1. Punctate focus of acute infarction in the right parietal lobe adjacent region of prior chronic infarction. 2. Background of mild chronic microvascular ischemic disease, parenchymal volume loss, and chronic infarcts in the right parietal lobe and left cerebellar hemisphere. These results were called by telephone at the time of interpretation on  01/30/2016 at 11:53 pm to Dr. Leonette Monarch, who verbally acknowledged these results. Electronically Signed   By: Kristine Garbe M.D.   On: 01/30/2016 23:54      Jeury Mcnab M.D on 02/01/2016 at 1:38 PM  Between 7am to 7pm - Pager - [phone]  After 7pm go to [email or web address] - password [facility]  [facility] -  Office  [phone]

## 2016-02-01 NOTE — Progress Notes (Signed)
  Progress Note    02/01/2016 6:52 AM * No surgery found *  Subjective:  no issues overnight   Vitals:   02/01/16 0433 02/01/16 0631  BP: 128/88 (!) 131/95  Pulse: 91 79  Resp: 17 20  Temp: 98.6 F (37 C) 98.6 F (37 C)    Physical Exam: Cardiac:  rrr Pulm: non labored Abd: soft Ext: moving all 4 with 5/5 strength Palpable bilateral femoral pulses  CBC    Component Value Date/Time   WBC 8.2 02/01/2016 0539   RBC 5.05 02/01/2016 0539   HGB 15.5 02/01/2016 0539   HCT 47.0 02/01/2016 0539   PLT 158 02/01/2016 0539   MCV 93.1 02/01/2016 0539   MCH 30.7 02/01/2016 0539   MCHC 33.0 02/01/2016 0539   RDW 13.8 02/01/2016 0539   LYMPHSABS 3.4 01/30/2016 1604   MONOABS 0.9 01/30/2016 1604   EOSABS 0.1 01/30/2016 1604   BASOSABS 0.1 01/30/2016 1604    BMET    Component Value Date/Time   NA 138 02/01/2016 0539   K 4.2 02/01/2016 0539   CL 105 02/01/2016 0539   CO2 25 02/01/2016 0539   GLUCOSE 97 02/01/2016 0539   BUN 11 02/01/2016 0539   CREATININE 1.14 02/01/2016 0539   CALCIUM 8.4 (L) 02/01/2016 0539   GFRNONAA 56 (L) 02/01/2016 0539   GFRAA >60 02/01/2016 0539    INR    Component Value Date/Time   INR 1.04 01/30/2016 1604    No intake or output data in the 24 hours ending 02/01/16 [id number]   Assessment:  82 y.o. male is here with small right parietal cva and R carotid stenosis 70% by CTA  Plan: -dual antiplatelet -cariology evaluation -endarterectomy vs. Stent early next week   Markala Sitts C. Donzetta Matters, MD [facility] Office: [phone] Pager: [phone]  02/01/2016 6:52 AM

## 2016-02-01 NOTE — Progress Notes (Signed)
STROKE TEAM PROGRESS NOTE   SUBJECTIVE (INTERVAL HISTORY) No new complaints. I discussed STroke AF with pt. He is interested. VVS consulted and will do CEA vs. CAS next week. Cardiology for preop evaluation.    OBJECTIVE Temp:  [97.2 F (36.2 C)-98.8 F (37.1 C)] 98.6 F (37 C) (08/18 0631) Pulse Rate:  [79-116] 79 (08/18 0631) Cardiac Rhythm: Normal sinus rhythm (08/18 0801) Resp:  [17-25] 20 (08/18 0631) BP: (109-156)/(76-100) 131/95 (08/18 0631) SpO2:  [91 %-95 %] 93 % (08/18 0631)  CBC:  Recent Labs Lab 01/30/16 1604  01/31/16 0434 02/01/16 0539  WBC 10.6*  --  10.5 8.2  NEUTROABS 6.2  --   --   --   HGB 18.3*  < > 16.7 15.5  HCT 53.7*  < > 49.1 47.0  MCV 93.1  --  92.5 93.1  PLT 184  --  164 158  < > = values in this interval not displayed.  Basic Metabolic Panel:   Recent Labs Lab 01/30/16 1604 01/30/16 1632 02/01/16 0539  NA 138 141 138  K 4.9 4.6 4.2  CL 107 105 105  CO2 24  --  25  GLUCOSE 140* 134* 97  BUN 13 17 11   CREATININE 1.20 1.00 1.14  CALCIUM 9.2  --  8.4*    Lipid Panel:     Component Value Date/Time   CHOL 142 01/31/2016 0434   TRIG 68 01/31/2016 0434   HDL 39 (L) 01/31/2016 0434   CHOLHDL 3.6 01/31/2016 0434   VLDL 14 01/31/2016 0434   LDLCALC 89 01/31/2016 0434   HgbA1c:  Lab Results  Component Value Date   HGBA1C 5.8 (H) 01/31/2016   Urine Drug Screen: No results found for: LABOPIA, COCAINSCRNUR, LABBENZ, AMPHETMU, THCU, LABBARB    IMAGING I have personally reviewed the radiological images below and agree with the radiology interpretations.  Dg Chest 2 View 01/31/2016 Mild left basilar atelectasis noted. Mild peribronchial thickening seen. Emphysematous change noted at the lung apices.   Ct Head Wo Contrast 01/30/2016 No acute finding. Generalized atrophy. 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   Mr Brain Wo Contrast 01/30/2016 1. Punctate focus of acute infarction in the right parietal lobe adjacent  region of prior chronic infarction. 2. Background of mild chronic microvascular ischemic disease, parenchymal volume loss, and chronic infarcts in the right parietal lobe and left cerebellar hemisphere.   2D Echocardiogram  - Left ventricle: The cavity size was normal. There was moderate concentric hypertrophy. Systolic function was normal. Wall motion was normal; there were no regional wall motion abnormalities. Doppler parameters are consistent with a reversible restrictive pattern, indicative of decreased left ventricular diastolic compliance and/or increased left atrial pressure (grade 3 diastolic dysfunction). - Aortic valve: Transvalvular velocity was increased. There was mild stenosis. Mean gradient (S): 19 mm Hg. Valve area (VTI): 0.97 cm^2. Valve area (Vmax): 0.98 cm^2. Valve area (Vmean): 0.74 cm^2. - Mitral valve: Transvalvular velocity was within the normal range. There was no evidence for stenosis. There was no regurgitation. Right ventricle: The cavity size was normal. Wall thickness was normal. Systolic function was normal. - Tricuspid valve: There was no regurgitation.  CTA NECK 01/31/2016  65-70% stenosis RIGHT internal carotid artery origin. Less than 50% stenosis LEFT internal carotid artery origin. 4 cm ascending aorta. Severe RIGHT C2-3 and severe LEFT C5-6 neural foraminal narrowing. Moderate to severe C5-6 canal stenosis.   CTA HEAD 01/31/2016  No emergent large vessel occlusion or high-grade stenosis. Approximately 50% stenosis RIGHT  anterior genu of the internal carotid artery. Moderate stenosis RIGHT P1 origin with complete circle of Willis.    PHYSICAL EXAM  Temp:  [97.2 F (36.2 C)-98.8 F (37.1 C)] 98.6 F (37 C) (08/18 0631) Pulse Rate:  [79-116] 79 (08/18 0631) Resp:  [17-25] 20 (08/18 0631) BP: (109-156)/(76-100) 131/95 (08/18 0631) SpO2:  [91 %-95 %] 93 % (08/18 0631)  General - Well nourished, well developed, in no apparent distress.  Ophthalmologic -  Sharp disc margins OU.   Cardiovascular - Regular rate and rhythm.  Mental Status -  Level of arousal and orientation to time, place, and person were intact. Language including expression, naming, repetition, comprehension was assessed and found intact. Fund of Knowledge was assessed and was intact.  Cranial Nerves II - XII - II - Visual field intact OU. III, IV, VI - Extraocular movements intact. V - Facial sensation intact bilaterally. VII - Facial movement intact bilaterally. VIII - Hearing & vestibular intact bilaterally. X - Palate elevates symmetrically. XI - Chin turning & shoulder shrug intact bilaterally. XII - Tongue protrusion intact.  Motor Strength - The patient's strength was normal in all extremities and pronator drift was absent.  Bulk was normal and fasciculations were absent.   Motor Tone - Muscle tone was assessed at the neck and appendages and was normal.  Reflexes - The patient's reflexes were 1+ in all extremities and he had no pathological reflexes.  Sensory - Light touch, temperature/pinprick were assessed and were symmetrical.    Coordination - The patient had normal movements in the hands and feet with no ataxia or dysmetria.  Tremor was absent.  Gait and Station - deferred.   ASSESSMENT/PLAN Mr. KROSS NICASIO is a 82 y.o. male with history of CAD s/p MI presenting with transient L arm numbness. He did not receive IV t-PA due to symptoms resolution.   Stroke:  Punctate right parietal lobe infarct embolic secondary to right ICA high grade stenosis  MRI  R parietal lobe infarct  CTA head and neck right ICA 70% stenosis, left ICA athero  2D Echo  No source of embolus  LE venous doppler - negative for DVT  LDL 89  HgbA1c 5.8  Lovenox 40 mg sq daily for VTE prophylaxis Diet Heart Room service appropriate? Yes; Fluid consistency: Thin  Aspirin 325 mg every other day prior to admission, now on aspirin 325 mg daily and clopidogrel 75 mg daily.  Recommend DAPT for about 3 months and then plavix alone.   Patient counseled to be compliant with his antithrombotic medications  Ongoing aggressive stroke risk factor management  Patient interested in STroke AF trial. Research team will follow up for eligibility.  Therapy recommendations:  No PT needs  Disposition:  Return home (Lives alone PTA)  Discussed STROKE AF trial and he is interested. Cardiology Dr. Rayann Heman made aware.  Right ICA high grade stenosis  Right ICA 65-70% stenosis  Likely the cause of right MCA infarcts  vascular surgeon saw - will obtain cardiology clearance prior to CEA vs CAS early next week (Dr. Donzetta Matters)  BP management  BP stable  Avoid hypotension  BP goal 130-150 before carotid intervention.  Hyperlipidemia  Home meds:  No statin  LDL 89, goal < 70  Add lipitor 40mg    Continue statin at discharge  Other Stroke Risk Factors  Advanced age  Former Cigarette smoker, quit 30 years ago  CAD/MI s/p PCI x 2 in 1988 and 1990  Overweight, Body mass index is 29.29  kg/m., recommend weight loss, diet and exercise as appropriate   Other Active Problems  Cervical radiculopathy  Hospital day # 1  Neurology will sign off. Please call with questions. Pt will follow up with Dr. Erlinda Hong at [facility] in about 2 months. Thanks for the consult.  Rosalin Hawking, MD PhD Stroke Neurology 02/01/2016 5:15 PM    To contact Stroke Continuity provider, please refer to [email or web address] After hours, contact General Neurology

## 2016-02-01 NOTE — Evaluation (Signed)
Physical Therapy Evaluation Patient Details Name: David Bradshaw MRN: [id number] DOB: [birth year] Today's Date: 02/01/2016   History of Present Illness  Patient is a 82 y/o male with hx of MI and CAD presents with left sided numbness/weakness. Symptoms have resolved. MRI- (+) punctate right parietal lobe infarct.   Clinical Impression  Patient presents with dyspnea on exertion and mild balance deficits which seem to be premorbid s/p above. Tolerated gait training while performing higher level balance activities with only minor deviations in gait but no LOB. Reports all symptoms have resolved. Pt lives alone and is independent PTA. Pt has supportive family that lives close by. Pt does not require further skilled therapy services as pt is functioning close to baseline. All education completed. Discharge from therapy.    Follow Up Recommendations No PT follow up;Supervision - Intermittent    Equipment Recommendations  None recommended by PT    Recommendations for Other Services       Precautions / Restrictions Precautions Precautions: None Precaution Comments: watch HR Restrictions Weight Bearing Restrictions: No      Mobility  Bed Mobility Overal bed mobility: Needs Assistance Bed Mobility: Sit to Supine       Sit to supine: Modified independent (Device/Increase time)   General bed mobility comments: No assist needed.  Transfers Overall transfer level: Needs assistance Equipment used: None Transfers: Sit to/from Stand Sit to Stand: Modified independent (Device/Increase time)         General transfer comment: No assist needed, no LOB.  Ambulation/Gait Ambulation/Gait assistance: Modified independent (Device/Increase time) Ambulation Distance (Feet): 200 Feet Assistive device: None Gait Pattern/deviations: Step-through pattern;Decreased stride length;Drifts right/left   Gait velocity interpretation: at or above normal speed for age/gender General Gait Details:  Steady gait, mild deviations noted during higher level balance challenges. See balance section. No overt LOB. HR up to 132 bpm. DOE -premorbid.  Stairs            Wheelchair Mobility    Modified Rankin (Stroke Patients Only) Modified Rankin (Stroke Patients Only) Pre-Morbid Rankin Score: No symptoms Modified Rankin: No symptoms     Balance Overall balance assessment: Needs assistance Sitting-balance support: Feet supported;No upper extremity supported Sitting balance-Leahy Scale: Good Sitting balance - Comments: Able to reach outside BoS and adjust socks.   Standing balance support: During functional activity Standing balance-Leahy Scale: Fair               High level balance activites: Head turns;Sudden stops;Turns;Direction changes High Level Balance Comments: Tolerated above with only mild deviations in gait but no overt LOB. Good balance reactions. Standardized Balance Assessment Standardized Balance Assessment : Dynamic Gait Index   Dynamic Gait Index Level Surface: Normal Change in Gait Speed: Mild Impairment Gait with Horizontal Head Turns: Mild Impairment Gait with Vertical Head Turns: Normal Gait and Pivot Turn: Normal Step Over Obstacle: Normal Step Around Obstacles: Normal       Pertinent Vitals/Pain Pain Assessment: No/denies pain    Home Living Family/patient expects to be discharged to:: Private residence Living Arrangements: Alone Available Help at Discharge: Family;Available PRN/intermittently Type of Home: House Home Access: Level entry     Home Layout: One level Home Equipment: None      Prior Function Level of Independence: Independent         Comments: No falls reported     Hand Dominance        Extremity/Trunk Assessment   Upper Extremity Assessment: Defer to OT evaluation  Lower Extremity Assessment: Overall WFL for tasks assessed         Communication   Communication: No difficulties   Cognition Arousal/Alertness: Awake/alert Behavior During Therapy: WFL for tasks assessed/performed Overall Cognitive Status: Within Functional Limits for tasks assessed                      General Comments      Exercises        Assessment/Plan    PT Assessment Patent does not need any further PT services  PT Diagnosis Difficulty walking   PT Problem List    PT Treatment Interventions     PT Goals (Current goals can be found in the Care Plan section) Acute Rehab PT Goals Patient Stated Goal: to go home PT Goal Formulation: All assessment and education complete, DC therapy    Frequency     Barriers to discharge        Co-evaluation               End of Session Equipment Utilized During Treatment: Gait belt Activity Tolerance: Patient tolerated treatment well Patient left: in bed;with call bell/phone within reach Nurse Communication: Mobility status         Time: [id number] PT Time Calculation (min) (ACUTE ONLY): 12 min   Charges:   PT Evaluation $PT Eval Low Complexity: 1 Procedure     PT G Codes:        Tavone Caesar A Jalissa Heinzelman 02/01/2016, 10:28 AM  Wray Kearns, PT, DPT [phone]

## 2016-02-01 NOTE — Consult Note (Signed)
Primary Physician: Primary Cardiologist:  New    Asked to see re preop risk stratification   HPI:  Pt is a 82 yo with hxory of MI x 2 in past  Had PTCA x2  In late 1980s ([place]) He had CP at those times   Did not follow up with cardiology  Does not have a primary care provider [facility] on 8/16 with LUE weakness and numbness  Transient.    MRI with focal parietal infarcts  Seen by vascular surgery for revascularization.  The pt denies CP  He does get SOB with activity  Takes things slowly  Does not do that much activity           Past Medical History:  Diagnosis Date  . Coronary arteriosclerosis due to lipid rich plaque 01/30/2016  . Myocardial infarct ([place])    1988 and 1990    Medications Prior to Admission  Medication Sig Dispense Refill  . aspirin EC 325 MG tablet Take 325 mg by mouth every other day.    Marland Kitchen aspirin 81 MG tablet Take 81 mg by mouth daily.       .  stroke: mapping our early stages of recovery book   Does not apply Once  . aspirin  325 mg Oral Daily  . atorvastatin  40 mg Oral q1800  . clopidogrel  75 mg Oral Daily  . enoxaparin (LOVENOX) injection  40 mg Subcutaneous Daily    Infusions:    No Known Allergies  Social History   Social History  . Marital status: Married    Spouse name: N/A  . Number of children: N/A  . Years of education: N/A   Occupational History  . Not on file.   Social History Main Topics  . Smoking status: Former Research scientist (life sciences)  . Smokeless tobacco: Never Used  . Alcohol use No  . Drug use: No  . Sexual activity: Not on file   Other Topics Concern  . Not on file   Social History Narrative  . No narrative on file    Family History  Problem Relation Age of Onset  . Heart attack Mother   . Bone cancer Father     REVIEW OF SYSTEMS:  All systems reviewed  Negative to the above problem except as noted above.    PHYSICAL EXAM: Vitals:   02/01/16 0433 02/01/16 0631  BP: 128/88 (!) 131/95  Pulse: 91 79  Resp: 17 20   Temp: 98.6 F (37 C) 98.6 F (37 C)    No intake or output data in the 24 hours ending 02/01/16 1445  General:  Well appearing. No respiratory difficulty HEENT: normal Neck: supple. no JVD. Carotids 2+ bilat; no bruits. No lymphadenopathy or thryomegaly appreciated. Cor: PMI nondisplaced. Regular rate & rhythm. No rubs, gallops or murmurs. Lungs: clear Abdomen: soft, nontender, nondistended. No hepatosplenomegaly. No bruits or masses. Good bowel sounds. Extremities: no cyanosis, clubbing, rash, edema Neuro: alert & oriented x 3, cranial nerves grossly intact. moves all 4 extremities w/o difficulty. Affect pleasant.  ECG:  ST 103 bpm  First degree AV  Block  PR 218 msec  Poor R wave progression    Results for orders placed or performed during the hospital encounter of 01/30/16 (from the past 24 hour(s))  CBC     Status: None   Collection Time: 02/01/16  5:39 AM  Result Value Ref Range   WBC 8.2 4.0 - 10.5 K/uL   RBC 5.05 4.22 - 5.81 MIL/uL  Hemoglobin 15.5 13.0 - 17.0 g/dL   HCT 47.0 39.0 - 52.0 %   MCV 93.1 78.0 - 100.0 fL   MCH 30.7 26.0 - 34.0 pg   MCHC 33.0 30.0 - 36.0 g/dL   RDW 13.8 11.5 - 15.5 %   Platelets 158 150 - 400 K/uL  Basic metabolic panel     Status: Abnormal   Collection Time: 02/01/16  5:39 AM  Result Value Ref Range   Sodium 138 135 - 145 mmol/L   Potassium 4.2 3.5 - 5.1 mmol/L   Chloride 105 101 - 111 mmol/L   CO2 25 22 - 32 mmol/L   Glucose, Bld 97 65 - 99 mg/dL   BUN 11 6 - 20 mg/dL   Creatinine, Ser 1.14 0.61 - 1.24 mg/dL   Calcium 8.4 (L) 8.9 - 10.3 mg/dL   GFR calc non Af Amer 56 (L) >60 mL/min   GFR calc Af Amer >60 >60 mL/min   Anion gap 8 5 - 15   Ct Angio Head W Or Wo Contrast  Result Date: 01/31/2016 CLINICAL DATA:  Code stroke, LEFT arm weakness now resolved. History of atrial fibrillation. EXAM: CT ANGIOGRAPHY HEAD AND NECK TECHNIQUE: Multidetector CT imaging of the head and neck was performed using the standard protocol during bolus  administration of intravenous contrast. Multiplanar CT image reconstructions and MIPs were obtained to evaluate the vascular anatomy. Carotid stenosis measurements (when applicable) are obtained utilizing NASCET criteria, using the distal internal carotid diameter as the denominator. CONTRAST:  50 cc Isovue 370 COMPARISON:  MRI of the brain January 30, 2016 FINDINGS: CTA NECK AORTIC ARCH: 4 cm ascending aorta with mild calcific atherosclerosis. The origins of the innominate, left Common carotid artery and subclavian artery are widely patent. Common origin of the innominate and LEFT subclavian artery. RIGHT CAROTID SYSTEM: Common carotid artery is widely patent, coursing in a straight line fashion. Eccentric intimal thickening and to lesser extent calcific atherosclerosis results in 9 mm segment 65-70% stenosis RIGHT internal carotid artery within 1 cm of the origin. LEFT CAROTID SYSTEM: Common carotid artery is widely patent, coursing in a straight line fashion. Normal appearance of the carotid bifurcation without hemodynamically significant stenosis by NASCET criteria. Eccentric intimal thickening results in less than 50% stenosis LEFT internal carotid artery origin. VERTEBRAL ARTERIES:Codominant vertebral artery's. Normal appearance of the vertebral arteries, which appear widely patent. Mild extrinsic deformity due to degenerative cervical spine. SKELETON: No acute osseous process though bone windows have not been submitted. Patient is edentulous. Grade 1 C2-3 anterolisthesis associated with severe RIGHT facet arthropathy. Severe C4-5 through C6-7 degenerative discs. Severe RIGHT C2-3, moderate to severe bilateral C3-4, severe LEFT C5-6 neural foraminal narrowing. Moderate to severe C5-6 canal stenosis. Broad levoscoliosis. OTHER NECK: Soft tissues of the neck are non-acute though, not tailored for evaluation. Included view of the lung apices demonstrates severe centrilobular emphysema in LEFT apical bullous  changes. No superior mediastinal lymphadenopathy. Mild medial deviation of RIGHT true vocal cord, RIGHTcricoarytenoid cartilage can be seen with vocal cord paralysis. CTA HEAD ANTERIOR CIRCULATION: Bilateral internal carotid arteries are patent. Intimal thickening results in focal at least 50% stenosis RIGHT anterior genu of the internal carotid artery. Mild calcific atherosclerosis of the carotid siphons. Developmentally small RIGHT A1 segment. Normal appearance the bilateral anterior cerebral arteries. Patent anterior communicating artery. Normal appearance the bilateral middle cerebral arteries. No large vessel occlusion, hemodynamically significant stenosis, dissection, luminal irregularity, contrast extravasation or aneurysm. POSTERIOR CIRCULATION: Normal appearance of the vertebral arteries, vertebrobasilar  junction and basilar artery, as well as main branch vessels. Moderate stenosis RIGHT P1 origin. Otherwise appearance of the posterior cerebral arteries. Robust bilateral posterior communicating arteries present. No large vessel occlusion, dissection, luminal irregularity, contrast extravasation or aneurysm. VENOUS SINUSES: Major dural venous sinuses are patent though not tailored for evaluation on this angiographic examination. ANATOMIC VARIANTS: None. DELAYED PHASE: No abnormal intracranial enhancement. IMPRESSION: CTA NECK: 65-70% stenosis RIGHT internal carotid artery origin. Less than 50% stenosis LEFT internal carotid artery origin. 4 cm ascending aorta. Recommend annual imaging followup by CTA or MRA. This recommendation follows 2010 ACCF/AHA/AATS/ACR/ASA/SCA/SCAI/SIR/STS/SVM Guidelines for the Diagnosis and Management of Patients with Thoracic Aortic Disease. Circulation. 2010; 121: [id number] Severe RIGHT C2-3 and severe LEFT C5-6 neural foraminal narrowing. Moderate to severe C5-6 canal stenosis. CTA HEAD: No emergent large vessel occlusion or high-grade stenosis. Approximately 50% stenosis RIGHT  anterior genu of the internal carotid artery. Moderate stenosis RIGHT P1 origin with complete circle of Willis. Electronically Signed   By: Elon Alas M.D.   On: 01/31/2016 14:13   Dg Chest 2 View  Result Date: 01/31/2016 CLINICAL DATA:  Acute onset of left-sided weakness. Initial encounter. EXAM: CHEST  2 VIEW COMPARISON:  Chest radiograph performed 11/01/2007 FINDINGS: The lungs are well-aerated. Mild left basilar atelectasis is noted. Mild peribronchial thickening is seen. There is no evidence of pleural effusion or pneumothorax. Emphysematous change is noted at the lung apices. The heart is borderline normal in size. No acute osseous abnormalities are seen. IMPRESSION: Mild left basilar atelectasis noted. Mild peribronchial thickening seen. Emphysematous change noted at the lung apices. Electronically Signed   By: Garald Balding M.D.   On: 01/31/2016 00:46   Ct Head Wo Contrast  Result Date: 01/30/2016 CLINICAL DATA:  Left arm numbness and nausea up earlier today which has result. EXAM: CT HEAD WITHOUT CONTRAST TECHNIQUE: Contiguous axial images were obtained from the base of the skull through the vertex without intravenous contrast. COMPARISON:  None. FINDINGS: Brain: Generalized atrophy. The brainstem is normal. There may be an old small vessel left cerebellar infarction. Cerebral hemispheres show an old infarction in the right parietal cortical and subcortical brain. No sign of acute infarction, intra-axial mass lesion, hemorrhage, hydrocephalus or extra-axial collection. 7 mm left frontal dural calcification could represent a small calcified meningioma, not significant. Vascular: There is atherosclerotic calcification of the major vessels at the base of the brain. Skull: Normal Sinuses/Orbits: Clear Other: None IMPRESSION: No acute finding. Generalized atrophy. 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 Electronically Signed   By: Nelson Chimes M.D.   On: 01/30/2016 16:54   Ct  Angio Neck W Or Wo Contrast  Result Date: 01/31/2016 CLINICAL DATA:  Code stroke, LEFT arm weakness now resolved. History of atrial fibrillation. EXAM: CT ANGIOGRAPHY HEAD AND NECK TECHNIQUE: Multidetector CT imaging of the head and neck was performed using the standard protocol during bolus administration of intravenous contrast. Multiplanar CT image reconstructions and MIPs were obtained to evaluate the vascular anatomy. Carotid stenosis measurements (when applicable) are obtained utilizing NASCET criteria, using the distal internal carotid diameter as the denominator. CONTRAST:  50 cc Isovue 370 COMPARISON:  MRI of the brain January 30, 2016 FINDINGS: CTA NECK AORTIC ARCH: 4 cm ascending aorta with mild calcific atherosclerosis. The origins of the innominate, left Common carotid artery and subclavian artery are widely patent. Common origin of the innominate and LEFT subclavian artery. RIGHT CAROTID SYSTEM: Common carotid artery is widely patent, coursing in a straight line fashion. Eccentric intimal  thickening and to lesser extent calcific atherosclerosis results in 9 mm segment 65-70% stenosis RIGHT internal carotid artery within 1 cm of the origin. LEFT CAROTID SYSTEM: Common carotid artery is widely patent, coursing in a straight line fashion. Normal appearance of the carotid bifurcation without hemodynamically significant stenosis by NASCET criteria. Eccentric intimal thickening results in less than 50% stenosis LEFT internal carotid artery origin. VERTEBRAL ARTERIES:Codominant vertebral artery's. Normal appearance of the vertebral arteries, which appear widely patent. Mild extrinsic deformity due to degenerative cervical spine. SKELETON: No acute osseous process though bone windows have not been submitted. Patient is edentulous. Grade 1 C2-3 anterolisthesis associated with severe RIGHT facet arthropathy. Severe C4-5 through C6-7 degenerative discs. Severe RIGHT C2-3, moderate to severe bilateral C3-4,  severe LEFT C5-6 neural foraminal narrowing. Moderate to severe C5-6 canal stenosis. Broad levoscoliosis. OTHER NECK: Soft tissues of the neck are non-acute though, not tailored for evaluation. Included view of the lung apices demonstrates severe centrilobular emphysema in LEFT apical bullous changes. No superior mediastinal lymphadenopathy. Mild medial deviation of RIGHT true vocal cord, RIGHTcricoarytenoid cartilage can be seen with vocal cord paralysis. CTA HEAD ANTERIOR CIRCULATION: Bilateral internal carotid arteries are patent. Intimal thickening results in focal at least 50% stenosis RIGHT anterior genu of the internal carotid artery. Mild calcific atherosclerosis of the carotid siphons. Developmentally small RIGHT A1 segment. Normal appearance the bilateral anterior cerebral arteries. Patent anterior communicating artery. Normal appearance the bilateral middle cerebral arteries. No large vessel occlusion, hemodynamically significant stenosis, dissection, luminal irregularity, contrast extravasation or aneurysm. POSTERIOR CIRCULATION: Normal appearance of the vertebral arteries, vertebrobasilar junction and basilar artery, as well as main branch vessels. Moderate stenosis RIGHT P1 origin. Otherwise appearance of the posterior cerebral arteries. Robust bilateral posterior communicating arteries present. No large vessel occlusion, dissection, luminal irregularity, contrast extravasation or aneurysm. VENOUS SINUSES: Major dural venous sinuses are patent though not tailored for evaluation on this angiographic examination. ANATOMIC VARIANTS: None. DELAYED PHASE: No abnormal intracranial enhancement. IMPRESSION: CTA NECK: 65-70% stenosis RIGHT internal carotid artery origin. Less than 50% stenosis LEFT internal carotid artery origin. 4 cm ascending aorta. Recommend annual imaging followup by CTA or MRA. This recommendation follows 2010 ACCF/AHA/AATS/ACR/ASA/SCA/SCAI/SIR/STS/SVM Guidelines for the Diagnosis and  Management of Patients with Thoracic Aortic Disease. Circulation. 2010; 121: [id number] Severe RIGHT C2-3 and severe LEFT C5-6 neural foraminal narrowing. Moderate to severe C5-6 canal stenosis. CTA HEAD: No emergent large vessel occlusion or high-grade stenosis. Approximately 50% stenosis RIGHT anterior genu of the internal carotid artery. Moderate stenosis RIGHT P1 origin with complete circle of Willis. Electronically Signed   By: Elon Alas M.D.   On: 01/31/2016 14:13   Mr Brain Wo Contrast  Result Date: 01/30/2016 CLINICAL DATA:  82 y/o M; transient weakness and loss of sensation within the left arm. EXAM: MRI HEAD WITHOUT CONTRAST TECHNIQUE: Multiplanar, multiecho pulse sequences of the brain and surrounding structures were obtained without intravenous contrast. COMPARISON:  Head CT dated 01/30/2016. FINDINGS: Brain: There is diffusion hyperintensity without low ADC in region of gliosis from prior infarct in the right parietal lobe consistent with T2 shine through. A single punctate focus in the right parietal lobe may demonstrate low ADC an represent an acute area of infarction (series 4, image 34). Mild hemosiderin staining of the right parietal infarct. No additional focus of susceptibility hypointensity to indicate intracranial hemorrhage. Background of mild parenchymal volume loss and chronic microvascular ischemic changes. Small chronic infarct in the left cerebellar hemisphere. Extra-axial space: Normal ventricular size. No midline shift. No  effacement of basilar cisterns. No extra-axial collection is identified. Proximal intracranial flow voids are maintained. No abnormality of the cervical medullary junction. Other: No abnormal signal of the paranasal sinuses. Partial opacification of the right mastoid air cells. No abnormal signal of left mastoid air cells. Orbits are unremarkable. Calvarium is unremarkable. IMPRESSION: 1. Punctate focus of acute infarction in the right parietal lobe  adjacent region of prior chronic infarction. 2. Background of mild chronic microvascular ischemic disease, parenchymal volume loss, and chronic infarcts in the right parietal lobe and left cerebellar hemisphere. These results were called by telephone at the time of interpretation on 01/30/2016 at 11:53 pm to Dr. Leonette Monarch, who verbally acknowledged these results. Electronically Signed   By: Kristine Garbe M.D.   On: 01/30/2016 23:54     ASSESSMENT:  Pt is a 82 yo with known CAD (no records at present )  Remote MI with PTCA x 2  He is not that active  Does get SOB if exerts self.  Walks slowly, any distance SOB ON exam, volume staus looks good  EKG with SR   Echo done shows normal LVEF  There is Gr III diastolic dysfunction   This may explain his dyspnea With his remote cardiac history and his relative inactivity I would recomm a Lexiscan myovue to r/o ischemia   Continue current meds Keep on ASA, PLAvix and Lipitor

## 2016-02-02 ENCOUNTER — Inpatient Hospital Stay (HOSPITAL_COMMUNITY): Payer: Medicare HMO

## 2016-02-02 DIAGNOSIS — I6521 Occlusion and stenosis of right carotid artery: Secondary | ICD-10-CM

## 2016-02-02 DIAGNOSIS — G458 Other transient cerebral ischemic attacks and related syndromes: Secondary | ICD-10-CM

## 2016-02-02 DIAGNOSIS — R0602 Shortness of breath: Secondary | ICD-10-CM

## 2016-02-02 DIAGNOSIS — I2583 Coronary atherosclerosis due to lipid rich plaque: Secondary | ICD-10-CM

## 2016-02-02 LAB — NM MYOCAR MULTI W/SPECT W/WALL MOTION / EF
Estimated workload: 1 METS
MPHR: 134 {beats}/min
Peak HR: 120 {beats}/min
Percent HR: 89 %
Rest HR: 110 {beats}/min

## 2016-02-02 MED ORDER — TECHNETIUM TC 99M TETROFOSMIN IV KIT
10.0000 | PACK | Freq: Once | INTRAVENOUS | Status: AC | PRN
  Administered 2016-02-02: 10 via INTRAVENOUS

## 2016-02-02 MED ORDER — REGADENOSON 0.4 MG/5ML IV SOLN
INTRAVENOUS | Status: AC
  Filled 2016-02-02: qty 5

## 2016-02-02 MED ORDER — REGADENOSON 0.4 MG/5ML IV SOLN
0.4000 mg | Freq: Once | INTRAVENOUS | Status: AC
  Administered 2016-02-02: 0.4 mg via INTRAVENOUS
  Filled 2016-02-02: qty 5

## 2016-02-02 MED ORDER — ATORVASTATIN CALCIUM 40 MG PO TABS
40.0000 mg | ORAL_TABLET | Freq: Every day | ORAL | Status: DC
  Administered 2016-02-02: 40 mg via ORAL
  Filled 2016-02-02: qty 1

## 2016-02-02 MED ORDER — SODIUM CHLORIDE 0.9 % IV SOLN
INTRAVENOUS | Status: DC
  Administered 2016-02-02: 17:00:00 via INTRAVENOUS

## 2016-02-02 MED ORDER — TECHNETIUM TC 99M TETROFOSMIN IV KIT
30.0000 | PACK | Freq: Once | INTRAVENOUS | Status: AC | PRN
  Administered 2016-02-02: 30 via INTRAVENOUS

## 2016-02-02 NOTE — Progress Notes (Addendum)
Cardiologist: Dr. Harrington Challenger Subjective:   Overall laying flat, comfortable, no chest pain. Nothing by mouth, will wondering when test is going to get done.  Objective:  Vital Signs in the last 24 hours: Temp:  [98.2 F (36.8 C)-98.8 F (37.1 C)] 98.4 F (36.9 C) (08/19 1028) Pulse Rate:  [66-98] 98 (08/19 1028) Resp:  [16-22] 18 (08/19 1028) BP: (77-109)/(49-82) 101/82 (08/19 1028) SpO2:  [90 %-95 %] 93 % (08/19 1028)  Intake/Output from previous day: No intake/output data recorded.   Physical Exam: General: Well developed, well nourished, in no acute distress. Head:  Normocephalic and atraumatic. Lungs: Clear to auscultation and percussion. Heart: Normal S1 and S2.  1/6 systolic murmur right upper sternal border, no rubs or gallops.  Abdomen: soft, non-tender, positive bowel sounds. Overweight Extremities: No clubbing or cyanosis. No edema. Neurologic: Alert and oriented x 3.    Lab Results:  Recent Labs  01/31/16 0434 02/01/16 0539  WBC 10.5 8.2  HGB 16.7 15.5  PLT 164 158    Recent Labs  01/30/16 1604 01/30/16 1632 02/01/16 0539  NA 138 141 138  K 4.9 4.6 4.2  CL 107 105 105  CO2 24  --  25  GLUCOSE 140* 134* 97  BUN 13 17 11   CREATININE 1.20 1.00 1.14   No results for input(s): TROPONINI in the last 72 hours.  Invalid input(s): CK, MB Hepatic Function Panel  Recent Labs  01/30/16 1604  PROT 6.8  ALBUMIN 3.6  AST 43*  ALT 30  ALKPHOS 87  BILITOT 1.0    Recent Labs  01/31/16 0434  CHOL 142   No results for input(s): PROTIME in the last 72 hours.  Imaging: Ct Angio Head W Or Wo Contrast  Result Date: 01/31/2016 CLINICAL DATA:  Code stroke, LEFT arm weakness now resolved. History of atrial fibrillation. EXAM: CT ANGIOGRAPHY HEAD AND NECK TECHNIQUE: Multidetector CT imaging of the head and neck was performed using the standard protocol during bolus administration of intravenous contrast. Multiplanar CT image reconstructions and MIPs were  obtained to evaluate the vascular anatomy. Carotid stenosis measurements (when applicable) are obtained utilizing NASCET criteria, using the distal internal carotid diameter as the denominator. CONTRAST:  50 cc Isovue 370 COMPARISON:  MRI of the brain January 30, 2016 FINDINGS: CTA NECK AORTIC ARCH: 4 cm ascending aorta with mild calcific atherosclerosis. The origins of the innominate, left Common carotid artery and subclavian artery are widely patent. Common origin of the innominate and LEFT subclavian artery. RIGHT CAROTID SYSTEM: Common carotid artery is widely patent, coursing in a straight line fashion. Eccentric intimal thickening and to lesser extent calcific atherosclerosis results in 9 mm segment 65-70% stenosis RIGHT internal carotid artery within 1 cm of the origin. LEFT CAROTID SYSTEM: Common carotid artery is widely patent, coursing in a straight line fashion. Normal appearance of the carotid bifurcation without hemodynamically significant stenosis by NASCET criteria. Eccentric intimal thickening results in less than 50% stenosis LEFT internal carotid artery origin. VERTEBRAL ARTERIES:Codominant vertebral artery's. Normal appearance of the vertebral arteries, which appear widely patent. Mild extrinsic deformity due to degenerative cervical spine. SKELETON: No acute osseous process though bone windows have not been submitted. Patient is edentulous. Grade 1 C2-3 anterolisthesis associated with severe RIGHT facet arthropathy. Severe C4-5 through C6-7 degenerative discs. Severe RIGHT C2-3, moderate to severe bilateral C3-4, severe LEFT C5-6 neural foraminal narrowing. Moderate to severe C5-6 canal stenosis. Broad levoscoliosis. OTHER NECK: Soft tissues of the neck are non-acute though, not tailored for  evaluation. Included view of the lung apices demonstrates severe centrilobular emphysema in LEFT apical bullous changes. No superior mediastinal lymphadenopathy. Mild medial deviation of RIGHT true vocal cord,  RIGHTcricoarytenoid cartilage can be seen with vocal cord paralysis. CTA HEAD ANTERIOR CIRCULATION: Bilateral internal carotid arteries are patent. Intimal thickening results in focal at least 50% stenosis RIGHT anterior genu of the internal carotid artery. Mild calcific atherosclerosis of the carotid siphons. Developmentally small RIGHT A1 segment. Normal appearance the bilateral anterior cerebral arteries. Patent anterior communicating artery. Normal appearance the bilateral middle cerebral arteries. No large vessel occlusion, hemodynamically significant stenosis, dissection, luminal irregularity, contrast extravasation or aneurysm. POSTERIOR CIRCULATION: Normal appearance of the vertebral arteries, vertebrobasilar junction and basilar artery, as well as main branch vessels. Moderate stenosis RIGHT P1 origin. Otherwise appearance of the posterior cerebral arteries. Robust bilateral posterior communicating arteries present. No large vessel occlusion, dissection, luminal irregularity, contrast extravasation or aneurysm. VENOUS SINUSES: Major dural venous sinuses are patent though not tailored for evaluation on this angiographic examination. ANATOMIC VARIANTS: None. DELAYED PHASE: No abnormal intracranial enhancement. IMPRESSION: CTA NECK: 65-70% stenosis RIGHT internal carotid artery origin. Less than 50% stenosis LEFT internal carotid artery origin. 4 cm ascending aorta. Recommend annual imaging followup by CTA or MRA. This recommendation follows 2010 ACCF/AHA/AATS/ACR/ASA/SCA/SCAI/SIR/STS/SVM Guidelines for the Diagnosis and Management of Patients with Thoracic Aortic Disease. Circulation. 2010; 121: [id number] Severe RIGHT C2-3 and severe LEFT C5-6 neural foraminal narrowing. Moderate to severe C5-6 canal stenosis. CTA HEAD: No emergent large vessel occlusion or high-grade stenosis. Approximately 50% stenosis RIGHT anterior genu of the internal carotid artery. Moderate stenosis RIGHT P1 origin with complete circle  of Willis. Electronically Signed   By: Elon Alas M.D.   On: 01/31/2016 14:13   Ct Angio Neck W Or Wo Contrast  Result Date: 01/31/2016 CLINICAL DATA:  Code stroke, LEFT arm weakness now resolved. History of atrial fibrillation. EXAM: CT ANGIOGRAPHY HEAD AND NECK TECHNIQUE: Multidetector CT imaging of the head and neck was performed using the standard protocol during bolus administration of intravenous contrast. Multiplanar CT image reconstructions and MIPs were obtained to evaluate the vascular anatomy. Carotid stenosis measurements (when applicable) are obtained utilizing NASCET criteria, using the distal internal carotid diameter as the denominator. CONTRAST:  50 cc Isovue 370 COMPARISON:  MRI of the brain January 30, 2016 FINDINGS: CTA NECK AORTIC ARCH: 4 cm ascending aorta with mild calcific atherosclerosis. The origins of the innominate, left Common carotid artery and subclavian artery are widely patent. Common origin of the innominate and LEFT subclavian artery. RIGHT CAROTID SYSTEM: Common carotid artery is widely patent, coursing in a straight line fashion. Eccentric intimal thickening and to lesser extent calcific atherosclerosis results in 9 mm segment 65-70% stenosis RIGHT internal carotid artery within 1 cm of the origin. LEFT CAROTID SYSTEM: Common carotid artery is widely patent, coursing in a straight line fashion. Normal appearance of the carotid bifurcation without hemodynamically significant stenosis by NASCET criteria. Eccentric intimal thickening results in less than 50% stenosis LEFT internal carotid artery origin. VERTEBRAL ARTERIES:Codominant vertebral artery's. Normal appearance of the vertebral arteries, which appear widely patent. Mild extrinsic deformity due to degenerative cervical spine. SKELETON: No acute osseous process though bone windows have not been submitted. Patient is edentulous. Grade 1 C2-3 anterolisthesis associated with severe RIGHT facet arthropathy. Severe C4-5  through C6-7 degenerative discs. Severe RIGHT C2-3, moderate to severe bilateral C3-4, severe LEFT C5-6 neural foraminal narrowing. Moderate to severe C5-6 canal stenosis. Broad levoscoliosis. OTHER NECK: Soft tissues of the  neck are non-acute though, not tailored for evaluation. Included view of the lung apices demonstrates severe centrilobular emphysema in LEFT apical bullous changes. No superior mediastinal lymphadenopathy. Mild medial deviation of RIGHT true vocal cord, RIGHTcricoarytenoid cartilage can be seen with vocal cord paralysis. CTA HEAD ANTERIOR CIRCULATION: Bilateral internal carotid arteries are patent. Intimal thickening results in focal at least 50% stenosis RIGHT anterior genu of the internal carotid artery. Mild calcific atherosclerosis of the carotid siphons. Developmentally small RIGHT A1 segment. Normal appearance the bilateral anterior cerebral arteries. Patent anterior communicating artery. Normal appearance the bilateral middle cerebral arteries. No large vessel occlusion, hemodynamically significant stenosis, dissection, luminal irregularity, contrast extravasation or aneurysm. POSTERIOR CIRCULATION: Normal appearance of the vertebral arteries, vertebrobasilar junction and basilar artery, as well as main branch vessels. Moderate stenosis RIGHT P1 origin. Otherwise appearance of the posterior cerebral arteries. Robust bilateral posterior communicating arteries present. No large vessel occlusion, dissection, luminal irregularity, contrast extravasation or aneurysm. VENOUS SINUSES: Major dural venous sinuses are patent though not tailored for evaluation on this angiographic examination. ANATOMIC VARIANTS: None. DELAYED PHASE: No abnormal intracranial enhancement. IMPRESSION: CTA NECK: 65-70% stenosis RIGHT internal carotid artery origin. Less than 50% stenosis LEFT internal carotid artery origin. 4 cm ascending aorta. Recommend annual imaging followup by CTA or MRA. This recommendation  follows 2010 ACCF/AHA/AATS/ACR/ASA/SCA/SCAI/SIR/STS/SVM Guidelines for the Diagnosis and Management of Patients with Thoracic Aortic Disease. Circulation. 2010; 121: [id number] Severe RIGHT C2-3 and severe LEFT C5-6 neural foraminal narrowing. Moderate to severe C5-6 canal stenosis. CTA HEAD: No emergent large vessel occlusion or high-grade stenosis. Approximately 50% stenosis RIGHT anterior genu of the internal carotid artery. Moderate stenosis RIGHT P1 origin with complete circle of Willis. Electronically Signed   By: Elon Alas M.D.   On: 01/31/2016 14:13   Personally viewed.   Telemetry: Occasional bursts of atrial tachycardia, sinus tachycardia. No adverse arrhythmias. Personally viewed.   EKG:  Sinus tachycardia with first-degree AV block Personally viewed.  Cardiac Studies:  ECHO 01/31/16 - Left ventricle: The cavity size was normal. There was moderate   concentric hypertrophy. Systolic function was normal. Wall motion  was normal; there were no regional wall motion abnormalities.   Doppler parameters are consistent with a reversible restrictive   pattern, indicative of decreased left ventricular diastolic   compliance and/or increased left atrial pressure (grade 3   diastolic dysfunction). - Aortic valve: Transvalvular velocity was increased. There was   mild stenosis. Mean gradient (S): 19 mm Hg. Valve area (VTI):   0.97 cm^2. Valve area (Vmax): 0.98 cm^2. Valve area (Vmean): 0.74   cm^2. - Mitral valve: Transvalvular velocity was within the normal range.   There was no evidence for stenosis. There was no regurgitation. - Right ventricle: The cavity size was normal. Wall thickness was   normal. Systolic function was normal. - Tricuspid valve: There was no regurgitation.  Meds: Scheduled Meds: . aspirin  325 mg Oral Daily  . enoxaparin (LOVENOX) injection  40 mg Subcutaneous Daily   Continuous Infusions:  PRN Meds:.  Assessment/Plan:  Principal Problem:   TIA  (transient ischemic attack) Active Problems:   Coronary arteriosclerosis due to lipid rich plaque   Elevated hemoglobin (HCC)   CVA (cerebral infarction)   HLD (hyperlipidemia)   Carotid stenosis  Preoperative cardiovascular risk stratification prior to proposed carotid endarterectomy versus carotid stenting, right.  - Awaiting nuclear stress test ordered by Dr. Harrington Challenger.  - Currently nothing by mouth.  - No adverse arrhythmias on telemetry. Ejection fraction normal. Mild aortic  stenosis should not be of any clinical consequence.  TIA/stroke  - LDL 89 however given his peripheral vascular disease/carotid artery disease I would recommend initiation of statin therapy.  - Aspirin.  Coronary artery disease  - PTCA 2 in the late 1980s by Dr. Eustace Quail in the history of MI 2.  - Prior chest pain episodes surrounding those events. He has not had any chest discomfort or anginal symptoms. He does have shortness of breath with activity. Does not do much activity.  - Awaiting pharmacologic stress test for further risk stratification.   Kyli Sorter 02/02/2016, 11:02 AM

## 2016-02-02 NOTE — Progress Notes (Signed)
The patient was seen in nuclear medicine for a Lexiscan myoview. He tolerated the procedure well. No acute ST or TW changes on ECG.     STRESS IMAGES TO Amasa, NP  [place]

## 2016-02-02 NOTE — Progress Notes (Signed)
PROGRESS NOTE                                                                                                                                                                                                             Patient Demographics:    David Bradshaw, is a 82 y.o. male, DOB - [birth year], [id number]  Admit date - 01/30/2016   Admitting Physician David Patricia, MD  Outpatient Primary MD for the patient is No PCP Per Patient  LOS - 2   Chief Complaint  Patient presents with  . Extremity Weakness       Brief Narrative   82 year old male who presents to ED with left arm numbness, workup significant for acute CVA, And right ICA stenosis, Seen by vascular surgery, planned for endarterectomy versus stenting, cardiology consulted for clearance, planned for stress test today.   Subjective:    David Bradshaw today has, No headache, No chest pain, No abdominal pain - No Nausea, No Cough - SOB, Vision to hold rash with pruritus yesterday, report it looks worse today, he is hungry as he is nothing by mouth for stress test.   Assessment  & Plan :    Principal Problem:   TIA (transient ischemic attack) Active Problems:   Coronary arteriosclerosis due to lipid rich plaque   Elevated hemoglobin (HCC)   CVA (cerebral infarction)   HLD (hyperlipidemia)   Carotid stenosis   Acute ischemic CVA - MRI with evidence of punctate right parietal lobe infarct. -  2-D echo with no embolic source, - CTA head and neck significant for ICA stenosis 65-70%,  - Lower extremity venous Doppler with no evidence of DVT - Mentation is for dual antiplatelet therapy for 3 months, then continue Plavix alone , the patient on aspirin only, Plavix discontinued yesterday in the setting of new rash . - LDL 89, will resume patient on Lipitor today, and monitor his rash. - Glycohemoglobin A1c 5.8 - Start on IV fluids giving soft blood pressure, sure trying to avoid in  the setting of acute CVA and right ICA high-grade stenosis.  Right ICA high-grade stenosis - Vascular surgery consulted, and for endarterectomy versus stenting, cardiology consulted for clearance - We'll discuss with vascular surgery if possible Plavix allergy with change decision about surgical choice.  Hyperlipidemia - LDL 89, as you him today on  Lipitor, will monitor closely giving his rash.  CAD - Continue with aspirin and statin  Erythema -  appears to be having a drug rash, involving his back and abdomen, extending to the periphery, started recently on Plavix and Lipitor , rash most likely related to Plavix, will resume on Lipitor and monitor his rash , and then we'll reintroduce Plavix later .     Code Status : Full  Family Communication  : None at bedside, discussed with patient  Disposition Plan  : Pending decision about endarterectomy   Consults  :  neuro  Procedures  : none  DVT Prophylaxis  :  Lovenox  Lab Results  Component Value Date   PLT 158 02/01/2016    Antibiotics  :    Anti-infectives    None        Objective:   Vitals:   02/02/16 0126 02/02/16 0500 02/02/16 1028 02/02/16 1207  BP: 94/70 104/82 101/82 (!) 106/49  Pulse: 90 90 98   Resp: (!) 22 20 18    Temp: 98.2 F (36.8 C) 98.8 F (37.1 C) 98.4 F (36.9 C)   TempSrc: Oral Oral Oral   SpO2: 90% 93% 93%   Weight:      Height:        Wt Readings from Last 3 Encounters:  01/30/16 68 kg (150 lb)    No intake or output data in the 24 hours ending 02/02/16 1230   Physical Exam  Awake Alert, Oriented X 3, No new F.N deficits, Normal affect David Bradshaw.AT,PERRAL Supple Neck,No JVD, No cervical lymphadenopathy appriciated.  Symmetrical Chest wall movement, Good air movement bilaterally, CTAB RRR,No Gallops,Rubs or new Murmurs, No Parasternal Heave +ve B.Sounds, Abd Soft, No tenderness, No organomegaly appriciated, No rebound - guarding or rigidity. No Cyanosis, Clubbing or edema, And  developed erythematous rash on his back, abdomen, extended to put proximal periphery.    Data Review:    CBC  Recent Labs Lab 01/30/16 1604 01/30/16 1632 01/31/16 0434 02/01/16 0539  WBC 10.6*  --  10.5 8.2  HGB 18.3* 19.0* 16.7 15.5  HCT 53.7* 56.0* 49.1 47.0  PLT 184  --  164 158  MCV 93.1  --  92.5 93.1  MCH 31.7  --  31.5 30.7  MCHC 34.1  --  34.0 33.0  RDW 13.7  --  13.8 13.8  LYMPHSABS 3.4  --   --   --   MONOABS 0.9  --   --   --   EOSABS 0.1  --   --   --   BASOSABS 0.1  --   --   --     Chemistries   Recent Labs Lab 01/30/16 1604 01/30/16 1632 02/01/16 0539  NA 138 141 138  K 4.9 4.6 4.2  CL 107 105 105  CO2 24  --  25  GLUCOSE 140* 134* 97  BUN 13 17 11   CREATININE 1.20 1.00 1.14  CALCIUM 9.2  --  8.4*  AST 43*  --   --   ALT 30  --   --   ALKPHOS 87  --   --   BILITOT 1.0  --   --    ------------------------------------------------------------------------------------------------------------------  Recent Labs  01/31/16 0434  CHOL 142  HDL 39*  LDLCALC 89  TRIG 68  CHOLHDL 3.6    Lab Results  Component Value Date   HGBA1C 5.8 (H) 01/31/2016   ------------------------------------------------------------------------------------------------------------------ No results for input(s): TSH, T4TOTAL, T3FREE, THYROIDAB in the last  72 hours.  Invalid input(s): FREET3 ------------------------------------------------------------------------------------------------------------------ No results for input(s): VITAMINB12, FOLATE, FERRITIN, TIBC, IRON, RETICCTPCT in the last 72 hours.  Coagulation profile  Recent Labs Lab 01/30/16 1604  INR 1.04    No results for input(s): DDIMER in the last 72 hours.  Cardiac Enzymes No results for input(s): CKMB, TROPONINI, MYOGLOBIN in the last 168 hours.  Invalid input(s): CK ------------------------------------------------------------------------------------------------------------------ No results  found for: BNP  Inpatient Medications  Scheduled Meds: . regadenoson      . aspirin  325 mg Oral Daily  . atorvastatin  40 mg Oral q1800  . enoxaparin (LOVENOX) injection  40 mg Subcutaneous Daily  . regadenoson  0.4 mg Intravenous Once   Continuous Infusions: . sodium chloride     PRN Meds:.  Micro Results No results found for this or any previous visit (from the past 240 hour(s)).  Radiology Reports Ct Angio Head W Or Wo Contrast  Result Date: 01/31/2016 CLINICAL DATA:  Code stroke, LEFT arm weakness now resolved. History of atrial fibrillation. EXAM: CT ANGIOGRAPHY HEAD AND NECK TECHNIQUE: Multidetector CT imaging of the head and neck was performed using the standard protocol during bolus administration of intravenous contrast. Multiplanar CT image reconstructions and MIPs were obtained to evaluate the vascular anatomy. Carotid stenosis measurements (when applicable) are obtained utilizing NASCET criteria, using the distal internal carotid diameter as the denominator. CONTRAST:  50 cc Isovue 370 COMPARISON:  MRI of the brain January 30, 2016 FINDINGS: CTA NECK AORTIC ARCH: 4 cm ascending aorta with mild calcific atherosclerosis. The origins of the innominate, left Common carotid artery and subclavian artery are widely patent. Common origin of the innominate and LEFT subclavian artery. RIGHT CAROTID SYSTEM: Common carotid artery is widely patent, coursing in a straight line fashion. Eccentric intimal thickening and to lesser extent calcific atherosclerosis results in 9 mm segment 65-70% stenosis RIGHT internal carotid artery within 1 cm of the origin. LEFT CAROTID SYSTEM: Common carotid artery is widely patent, coursing in a straight line fashion. Normal appearance of the carotid bifurcation without hemodynamically significant stenosis by NASCET criteria. Eccentric intimal thickening results in less than 50% stenosis LEFT internal carotid artery origin. VERTEBRAL ARTERIES:Codominant vertebral  artery's. Normal appearance of the vertebral arteries, which appear widely patent. Mild extrinsic deformity due to degenerative cervical spine. SKELETON: No acute osseous process though bone windows have not been submitted. Patient is edentulous. Grade 1 C2-3 anterolisthesis associated with severe RIGHT facet arthropathy. Severe C4-5 through C6-7 degenerative discs. Severe RIGHT C2-3, moderate to severe bilateral C3-4, severe LEFT C5-6 neural foraminal narrowing. Moderate to severe C5-6 canal stenosis. Broad levoscoliosis. OTHER NECK: Soft tissues of the neck are non-acute though, not tailored for evaluation. Included view of the lung apices demonstrates severe centrilobular emphysema in LEFT apical bullous changes. No superior mediastinal lymphadenopathy. Mild medial deviation of RIGHT true vocal cord, RIGHTcricoarytenoid cartilage can be seen with vocal cord paralysis. CTA HEAD ANTERIOR CIRCULATION: Bilateral internal carotid arteries are patent. Intimal thickening results in focal at least 50% stenosis RIGHT anterior genu of the internal carotid artery. Mild calcific atherosclerosis of the carotid siphons. Developmentally small RIGHT A1 segment. Normal appearance the bilateral anterior cerebral arteries. Patent anterior communicating artery. Normal appearance the bilateral middle cerebral arteries. No large vessel occlusion, hemodynamically significant stenosis, dissection, luminal irregularity, contrast extravasation or aneurysm. POSTERIOR CIRCULATION: Normal appearance of the vertebral arteries, vertebrobasilar junction and basilar artery, as well as main branch vessels. Moderate stenosis RIGHT P1 origin. Otherwise appearance of the posterior cerebral arteries. Robust  bilateral posterior communicating arteries present. No large vessel occlusion, dissection, luminal irregularity, contrast extravasation or aneurysm. VENOUS SINUSES: Major dural venous sinuses are patent though not tailored for evaluation on this  angiographic examination. ANATOMIC VARIANTS: None. DELAYED PHASE: No abnormal intracranial enhancement. IMPRESSION: CTA NECK: 65-70% stenosis RIGHT internal carotid artery origin. Less than 50% stenosis LEFT internal carotid artery origin. 4 cm ascending aorta. Recommend annual imaging followup by CTA or MRA. This recommendation follows 2010 ACCF/AHA/AATS/ACR/ASA/SCA/SCAI/SIR/STS/SVM Guidelines for the Diagnosis and Management of Patients with Thoracic Aortic Disease. Circulation. 2010; 121: [id number] Severe RIGHT C2-3 and severe LEFT C5-6 neural foraminal narrowing. Moderate to severe C5-6 canal stenosis. CTA HEAD: No emergent large vessel occlusion or high-grade stenosis. Approximately 50% stenosis RIGHT anterior genu of the internal carotid artery. Moderate stenosis RIGHT P1 origin with complete circle of Willis. Electronically Signed   By: Elon Alas M.D.   On: 01/31/2016 14:13   Dg Chest 2 View  Result Date: 01/31/2016 CLINICAL DATA:  Acute onset of left-sided weakness. Initial encounter. EXAM: CHEST  2 VIEW COMPARISON:  Chest radiograph performed 11/01/2007 FINDINGS: The lungs are well-aerated. Mild left basilar atelectasis is noted. Mild peribronchial thickening is seen. There is no evidence of pleural effusion or pneumothorax. Emphysematous change is noted at the lung apices. The heart is borderline normal in size. No acute osseous abnormalities are seen. IMPRESSION: Mild left basilar atelectasis noted. Mild peribronchial thickening seen. Emphysematous change noted at the lung apices. Electronically Signed   By: Garald Balding M.D.   On: 01/31/2016 00:46   Ct Head Wo Contrast  Result Date: 01/30/2016 CLINICAL DATA:  Left arm numbness and nausea up earlier today which has result. EXAM: CT HEAD WITHOUT CONTRAST TECHNIQUE: Contiguous axial images were obtained from the base of the skull through the vertex without intravenous contrast. COMPARISON:  None. FINDINGS: Brain: Generalized atrophy. The  brainstem is normal. There may be an old small vessel left cerebellar infarction. Cerebral hemispheres show an old infarction in the right parietal cortical and subcortical brain. No sign of acute infarction, intra-axial mass lesion, hemorrhage, hydrocephalus or extra-axial collection. 7 mm left frontal dural calcification could represent a small calcified meningioma, not significant. Vascular: There is atherosclerotic calcification of the major vessels at the base of the brain. Skull: Normal Sinuses/Orbits: Clear Other: None IMPRESSION: No acute finding. Generalized atrophy. 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 Electronically Signed   By: Nelson Chimes M.D.   On: 01/30/2016 16:54   Ct Angio Neck W Or Wo Contrast  Result Date: 01/31/2016 CLINICAL DATA:  Code stroke, LEFT arm weakness now resolved. History of atrial fibrillation. EXAM: CT ANGIOGRAPHY HEAD AND NECK TECHNIQUE: Multidetector CT imaging of the head and neck was performed using the standard protocol during bolus administration of intravenous contrast. Multiplanar CT image reconstructions and MIPs were obtained to evaluate the vascular anatomy. Carotid stenosis measurements (when applicable) are obtained utilizing NASCET criteria, using the distal internal carotid diameter as the denominator. CONTRAST:  50 cc Isovue 370 COMPARISON:  MRI of the brain January 30, 2016 FINDINGS: CTA NECK AORTIC ARCH: 4 cm ascending aorta with mild calcific atherosclerosis. The origins of the innominate, left Common carotid artery and subclavian artery are widely patent. Common origin of the innominate and LEFT subclavian artery. RIGHT CAROTID SYSTEM: Common carotid artery is widely patent, coursing in a straight line fashion. Eccentric intimal thickening and to lesser extent calcific atherosclerosis results in 9 mm segment 65-70% stenosis RIGHT internal carotid artery within 1 cm of the  origin. LEFT CAROTID SYSTEM: Common carotid artery is widely patent,  coursing in a straight line fashion. Normal appearance of the carotid bifurcation without hemodynamically significant stenosis by NASCET criteria. Eccentric intimal thickening results in less than 50% stenosis LEFT internal carotid artery origin. VERTEBRAL ARTERIES:Codominant vertebral artery's. Normal appearance of the vertebral arteries, which appear widely patent. Mild extrinsic deformity due to degenerative cervical spine. SKELETON: No acute osseous process though bone windows have not been submitted. Patient is edentulous. Grade 1 C2-3 anterolisthesis associated with severe RIGHT facet arthropathy. Severe C4-5 through C6-7 degenerative discs. Severe RIGHT C2-3, moderate to severe bilateral C3-4, severe LEFT C5-6 neural foraminal narrowing. Moderate to severe C5-6 canal stenosis. Broad levoscoliosis. OTHER NECK: Soft tissues of the neck are non-acute though, not tailored for evaluation. Included view of the lung apices demonstrates severe centrilobular emphysema in LEFT apical bullous changes. No superior mediastinal lymphadenopathy. Mild medial deviation of RIGHT true vocal cord, RIGHTcricoarytenoid cartilage can be seen with vocal cord paralysis. CTA HEAD ANTERIOR CIRCULATION: Bilateral internal carotid arteries are patent. Intimal thickening results in focal at least 50% stenosis RIGHT anterior genu of the internal carotid artery. Mild calcific atherosclerosis of the carotid siphons. Developmentally small RIGHT A1 segment. Normal appearance the bilateral anterior cerebral arteries. Patent anterior communicating artery. Normal appearance the bilateral middle cerebral arteries. No large vessel occlusion, hemodynamically significant stenosis, dissection, luminal irregularity, contrast extravasation or aneurysm. POSTERIOR CIRCULATION: Normal appearance of the vertebral arteries, vertebrobasilar junction and basilar artery, as well as main branch vessels. Moderate stenosis RIGHT P1 origin. Otherwise appearance of  the posterior cerebral arteries. Robust bilateral posterior communicating arteries present. No large vessel occlusion, dissection, luminal irregularity, contrast extravasation or aneurysm. VENOUS SINUSES: Major dural venous sinuses are patent though not tailored for evaluation on this angiographic examination. ANATOMIC VARIANTS: None. DELAYED PHASE: No abnormal intracranial enhancement. IMPRESSION: CTA NECK: 65-70% stenosis RIGHT internal carotid artery origin. Less than 50% stenosis LEFT internal carotid artery origin. 4 cm ascending aorta. Recommend annual imaging followup by CTA or MRA. This recommendation follows 2010 ACCF/AHA/AATS/ACR/ASA/SCA/SCAI/SIR/STS/SVM Guidelines for the Diagnosis and Management of Patients with Thoracic Aortic Disease. Circulation. 2010; 121: [id number] Severe RIGHT C2-3 and severe LEFT C5-6 neural foraminal narrowing. Moderate to severe C5-6 canal stenosis. CTA HEAD: No emergent large vessel occlusion or high-grade stenosis. Approximately 50% stenosis RIGHT anterior genu of the internal carotid artery. Moderate stenosis RIGHT P1 origin with complete circle of Willis. Electronically Signed   By: Elon Alas M.D.   On: 01/31/2016 14:13   Mr Brain Wo Contrast  Result Date: 01/30/2016 CLINICAL DATA:  82 y/o M; transient weakness and loss of sensation within the left arm. EXAM: MRI HEAD WITHOUT CONTRAST TECHNIQUE: Multiplanar, multiecho pulse sequences of the brain and surrounding structures were obtained without intravenous contrast. COMPARISON:  Head CT dated 01/30/2016. FINDINGS: Brain: There is diffusion hyperintensity without low ADC in region of gliosis from prior infarct in the right parietal lobe consistent with T2 shine through. A single punctate focus in the right parietal lobe may demonstrate low ADC an represent an acute area of infarction (series 4, image 34). Mild hemosiderin staining of the right parietal infarct. No additional focus of susceptibility hypointensity  to indicate intracranial hemorrhage. Background of mild parenchymal volume loss and chronic microvascular ischemic changes. Small chronic infarct in the left cerebellar hemisphere. Extra-axial space: Normal ventricular size. No midline shift. No effacement of basilar cisterns. No extra-axial collection is identified. Proximal intracranial flow voids are maintained. No abnormality of the cervical medullary junction.  Other: No abnormal signal of the paranasal sinuses. Partial opacification of the right mastoid air cells. No abnormal signal of left mastoid air cells. Orbits are unremarkable. Calvarium is unremarkable. IMPRESSION: 1. Punctate focus of acute infarction in the right parietal lobe adjacent region of prior chronic infarction. 2. Background of mild chronic microvascular ischemic disease, parenchymal volume loss, and chronic infarcts in the right parietal lobe and left cerebellar hemisphere. These results were called by telephone at the time of interpretation on 01/30/2016 at 11:53 pm to Dr. Leonette Monarch, who verbally acknowledged these results. Electronically Signed   By: Kristine Garbe M.D.   On: 01/30/2016 23:54      Makarios Madlock M.D on 02/02/2016 at 12:30 PM  Between 7am to 7pm - Pager - [phone]  After 7pm go to [email or web address] - password [email or web address]  [facility] -  Office  [phone]

## 2016-02-03 DIAGNOSIS — D582 Other hemoglobinopathies: Secondary | ICD-10-CM

## 2016-02-03 DIAGNOSIS — R21 Rash and other nonspecific skin eruption: Secondary | ICD-10-CM

## 2016-02-03 DIAGNOSIS — I638 Other cerebral infarction: Secondary | ICD-10-CM

## 2016-02-03 DIAGNOSIS — R9439 Abnormal result of other cardiovascular function study: Secondary | ICD-10-CM

## 2016-02-03 LAB — BASIC METABOLIC PANEL
Anion gap: 9 (ref 5–15)
BUN: 18 mg/dL (ref 6–20)
CO2: 22 mmol/L (ref 22–32)
Calcium: 8 mg/dL — ABNORMAL LOW (ref 8.9–10.3)
Chloride: 107 mmol/L (ref 101–111)
Creatinine, Ser: 1.25 mg/dL — ABNORMAL HIGH (ref 0.61–1.24)
GFR calc Af Amer: 58 mL/min — ABNORMAL LOW (ref >60)
GFR calc non Af Amer: 50 mL/min — ABNORMAL LOW (ref >60)
Glucose, Bld: 110 mg/dL — ABNORMAL HIGH (ref 65–99)
Potassium: 4.3 mmol/L (ref 3.5–5.1)
Sodium: 138 mmol/L (ref 135–145)

## 2016-02-03 LAB — CBC
HCT: 49.3 % (ref 39.0–52.0)
Hemoglobin: 16.5 g/dL (ref 13.0–17.0)
MCH: 31.4 pg (ref 26.0–34.0)
MCHC: 33.5 g/dL (ref 30.0–36.0)
MCV: 93.9 fL (ref 78.0–100.0)
Platelets: 163 10*3/uL (ref 150–400)
RBC: 5.25 MIL/uL (ref 4.22–5.81)
RDW: 13.9 % (ref 11.5–15.5)
WBC: 13.3 10*3/uL — ABNORMAL HIGH (ref 4.0–10.5)

## 2016-02-03 MED ORDER — DIPHENHYDRAMINE HCL 25 MG PO CAPS
25.0000 mg | ORAL_CAPSULE | Freq: Four times a day (QID) | ORAL | Status: DC | PRN
  Administered 2016-02-03 – 2016-02-04 (×2): 25 mg via ORAL
  Filled 2016-02-03 (×3): qty 1

## 2016-02-03 NOTE — Progress Notes (Signed)
PROGRESS NOTE                                                                                                                                                                                                             Patient Demographics:    David Bradshaw, is a 82 y.o. male, DOB - [birth year], [id number]  Admit date - 01/30/2016   Admitting Physician David Patricia, MD  Outpatient Primary MD for the patient is No PCP Per Patient  LOS - 3   Chief Complaint  Patient presents with  . Extremity Weakness       Brief Narrative   82 year old male who presents to ED with left arm numbness, workup significant for acute CVA, And right ICA stenosis, Seen by vascular surgery, planned for endarterectomy versus stenting, cardiology consulted for clearance, Stress test high risk, with EF 23% with large infarct pattern, planned for cardiac cath in a.m..   Subjective:    David Bradshaw today has, No headache, No chest pain, No abdominal pain - No Nausea, No Cough ,complaining of itching.   Assessment  & Plan :    Principal Problem:   TIA (transient ischemic attack) Active Problems:   Coronary arteriosclerosis due to lipid rich plaque   Elevated hemoglobin (HCC)   CVA (cerebral infarction)   HLD (hyperlipidemia)   Carotid stenosis   Acute ischemic CVA - MRI with evidence of punctate right parietal lobe infarct. -  2-D echo with no embolic source, - CTA head and neck significant for ICA stenosis 65-70%,  - Lower extremity venous Doppler with no evidence of DVT - Recommendation is for dual antiplatelet therapy for 3 months, then continue Plavix alone , the patient on aspirin only, Plavix discontinued 8/18 in the setting of new rash . - LDL 89, Lipitor is on hold secondary to rash - Glycohemoglobin A1c 5.8  Right ICA high-grade stenosis - Vascular surgery consulted, and for endarterectomy versus stenting, cardiology consulted for clearance -  We'll discuss with vascular surgery if possible Plavix allergy with change decision about surgical choice.  Hyperlipidemia - LDL 89, as you him today on Lipitor, will monitor closely giving his rash.  CAD - Continue with aspirin - Patient with high risk stress test on 8/19, planned for diagnostic cardiac cath tomorrow.  Erythema -  appears to be having a drug  rash, involving his back and abdomen, extending to the periphery, started recently on Plavix and Lipitor , resume the Lipitor yesterday, worsening rash today, will discontinue , will continue to hold both meds, will start on Benadryl for itching .      Code Status : Full  Family Communication  : Tried to call son David Bradshaw  via phone, left a voicemail  Disposition Plan  : Pending decision about endarterectomy   Consults  :  neuro  Procedures  : none  DVT Prophylaxis  :  Lovenox  Lab Results  Component Value Date   PLT 163 02/03/2016    Antibiotics  :    Anti-infectives    None        Objective:   Vitals:   02/02/16 2100 02/03/16 0100 02/03/16 0500 02/03/16 0949  BP: 105/68 111/67 102/64 105/61  Pulse: (!) 101 89 96 93  Resp:  18 18 20   Temp: 98.2 F (36.8 C) 98.3 F (36.8 C) 98.2 F (36.8 C) 98.1 F (36.7 C)  TempSrc: Oral Oral Oral Oral  SpO2: 96% 96% 96% 94%  Weight:      Height:        Wt Readings from Last 3 Encounters:  01/30/16 68 kg (150 lb)     Intake/Output Summary (Last 24 hours) at 02/03/16 1313 Last data filed at 02/03/16 0840  Gross per 24 hour  Intake             2440 ml  Output              950 ml  Net             1490 ml     Physical Exam  Awake Alert, Oriented X 3, No new F.N deficits, Normal affect [place].AT,PERRAL Supple Neck,No JVD, No cervical lymphadenopathy appriciated.  Symmetrical Chest wall movement, Good air movement bilaterally, CTAB RRR,No Gallops,Rubs or new Murmurs, No Parasternal Heave +ve B.Sounds, Abd Soft, No tenderness, No organomegaly appriciated, No  rebound - guarding or rigidity. No Cyanosis, Clubbing or edema,  erythematous rash on his back, abdomen, extended to  proximal periphery.    Data Review:    CBC  Recent Labs Lab 01/30/16 1604 01/30/16 1632 01/31/16 0434 02/01/16 0539 02/03/16 0231  WBC 10.6*  --  10.5 8.2 13.3*  HGB 18.3* 19.0* 16.7 15.5 16.5  HCT 53.7* 56.0* 49.1 47.0 49.3  PLT 184  --  164 158 163  MCV 93.1  --  92.5 93.1 93.9  MCH 31.7  --  31.5 30.7 31.4  MCHC 34.1  --  34.0 33.0 33.5  RDW 13.7  --  13.8 13.8 13.9  LYMPHSABS 3.4  --   --   --   --   MONOABS 0.9  --   --   --   --   EOSABS 0.1  --   --   --   --   BASOSABS 0.1  --   --   --   --     Chemistries   Recent Labs Lab 01/30/16 1604 01/30/16 1632 02/01/16 0539 02/03/16 0231  NA 138 141 138 138  K 4.9 4.6 4.2 4.3  CL 107 105 105 107  CO2 24  --  25 22  GLUCOSE 140* 134* 97 110*  BUN 13 17 11 18   CREATININE 1.20 1.00 1.14 1.25*  CALCIUM 9.2  --  8.4* 8.0*  AST 43*  --   --   --   ALT 30  --   --   --  ALKPHOS 87  --   --   --   BILITOT 1.0  --   --   --    ------------------------------------------------------------------------------------------------------------------ No results for input(s): CHOL, HDL, LDLCALC, TRIG, CHOLHDL, LDLDIRECT in the last 72 hours.  Lab Results  Component Value Date   HGBA1C 5.8 (H) 01/31/2016   ------------------------------------------------------------------------------------------------------------------ No results for input(s): TSH, T4TOTAL, T3FREE, THYROIDAB in the last 72 hours.  Invalid input(s): FREET3 ------------------------------------------------------------------------------------------------------------------ No results for input(s): VITAMINB12, FOLATE, FERRITIN, TIBC, IRON, RETICCTPCT in the last 72 hours.  Coagulation profile  Recent Labs Lab 01/30/16 1604  INR 1.04    No results for input(s): DDIMER in the last 72 hours.  Cardiac Enzymes No results for input(s): CKMB,  TROPONINI, MYOGLOBIN in the last 168 hours.  Invalid input(s): CK ------------------------------------------------------------------------------------------------------------------ No results found for: BNP  Inpatient Medications  Scheduled Meds: . aspirin  325 mg Oral Daily  . enoxaparin (LOVENOX) injection  40 mg Subcutaneous Daily   Continuous Infusions:   PRN Meds:.  Micro Results No results found for this or any previous visit (from the past 240 hour(s)).  Radiology Reports Ct Angio Head W Or Wo Contrast  Result Date: 01/31/2016 CLINICAL DATA:  Code stroke, LEFT arm weakness now resolved. History of atrial fibrillation. EXAM: CT ANGIOGRAPHY HEAD AND NECK TECHNIQUE: Multidetector CT imaging of the head and neck was performed using the standard protocol during bolus administration of intravenous contrast. Multiplanar CT image reconstructions and MIPs were obtained to evaluate the vascular anatomy. Carotid stenosis measurements (when applicable) are obtained utilizing NASCET criteria, using the distal internal carotid diameter as the denominator. CONTRAST:  50 cc Isovue 370 COMPARISON:  MRI of the brain January 30, 2016 FINDINGS: CTA NECK AORTIC ARCH: 4 cm ascending aorta with mild calcific atherosclerosis. The origins of the innominate, left Common carotid artery and subclavian artery are widely patent. Common origin of the innominate and LEFT subclavian artery. RIGHT CAROTID SYSTEM: Common carotid artery is widely patent, coursing in a straight line fashion. Eccentric intimal thickening and to lesser extent calcific atherosclerosis results in 9 mm segment 65-70% stenosis RIGHT internal carotid artery within 1 cm of the origin. LEFT CAROTID SYSTEM: Common carotid artery is widely patent, coursing in a straight line fashion. Normal appearance of the carotid bifurcation without hemodynamically significant stenosis by NASCET criteria. Eccentric intimal thickening results in less than 50%  stenosis LEFT internal carotid artery origin. VERTEBRAL ARTERIES:Codominant vertebral artery's. Normal appearance of the vertebral arteries, which appear widely patent. Mild extrinsic deformity due to degenerative cervical spine. SKELETON: No acute osseous process though bone windows have not been submitted. Patient is edentulous. Grade 1 C2-3 anterolisthesis associated with severe RIGHT facet arthropathy. Severe C4-5 through C6-7 degenerative discs. Severe RIGHT C2-3, moderate to severe bilateral C3-4, severe LEFT C5-6 neural foraminal narrowing. Moderate to severe C5-6 canal stenosis. Broad levoscoliosis. OTHER NECK: Soft tissues of the neck are non-acute though, not tailored for evaluation. Included view of the lung apices demonstrates severe centrilobular emphysema in LEFT apical bullous changes. No superior mediastinal lymphadenopathy. Mild medial deviation of RIGHT true vocal cord, RIGHTcricoarytenoid cartilage can be seen with vocal cord paralysis. CTA HEAD ANTERIOR CIRCULATION: Bilateral internal carotid arteries are patent. Intimal thickening results in focal at least 50% stenosis RIGHT anterior genu of the internal carotid artery. Mild calcific atherosclerosis of the carotid siphons. Developmentally small RIGHT A1 segment. Normal appearance the bilateral anterior cerebral arteries. Patent anterior communicating artery. Normal appearance the bilateral middle cerebral arteries. No large vessel occlusion, hemodynamically significant  stenosis, dissection, luminal irregularity, contrast extravasation or aneurysm. POSTERIOR CIRCULATION: Normal appearance of the vertebral arteries, vertebrobasilar junction and basilar artery, as well as main branch vessels. Moderate stenosis RIGHT P1 origin. Otherwise appearance of the posterior cerebral arteries. Robust bilateral posterior communicating arteries present. No large vessel occlusion, dissection, luminal irregularity, contrast extravasation or aneurysm. VENOUS  SINUSES: Major dural venous sinuses are patent though not tailored for evaluation on this angiographic examination. ANATOMIC VARIANTS: None. DELAYED PHASE: No abnormal intracranial enhancement. IMPRESSION: CTA NECK: 65-70% stenosis RIGHT internal carotid artery origin. Less than 50% stenosis LEFT internal carotid artery origin. 4 cm ascending aorta. Recommend annual imaging followup by CTA or MRA. This recommendation follows 2010 ACCF/AHA/AATS/ACR/ASA/SCA/SCAI/SIR/STS/SVM Guidelines for the Diagnosis and Management of Patients with Thoracic Aortic Disease. Circulation. 2010; 121: [id number] Severe RIGHT C2-3 and severe LEFT C5-6 neural foraminal narrowing. Moderate to severe C5-6 canal stenosis. CTA HEAD: No emergent large vessel occlusion or high-grade stenosis. Approximately 50% stenosis RIGHT anterior genu of the internal carotid artery. Moderate stenosis RIGHT P1 origin with complete circle of Willis. Electronically Signed   By: Elon Alas M.D.   On: 01/31/2016 14:13   Dg Chest 2 View  Result Date: 01/31/2016 CLINICAL DATA:  Acute onset of left-sided weakness. Initial encounter. EXAM: CHEST  2 VIEW COMPARISON:  Chest radiograph performed 11/01/2007 FINDINGS: The lungs are well-aerated. Mild left basilar atelectasis is noted. Mild peribronchial thickening is seen. There is no evidence of pleural effusion or pneumothorax. Emphysematous change is noted at the lung apices. The heart is borderline normal in size. No acute osseous abnormalities are seen. IMPRESSION: Mild left basilar atelectasis noted. Mild peribronchial thickening seen. Emphysematous change noted at the lung apices. Electronically Signed   By: Garald Balding M.D.   On: 01/31/2016 00:46   Ct Head Wo Contrast  Result Date: 01/30/2016 CLINICAL DATA:  Left arm numbness and nausea up earlier today which has result. EXAM: CT HEAD WITHOUT CONTRAST TECHNIQUE: Contiguous axial images were obtained from the base of the skull through the vertex  without intravenous contrast. COMPARISON:  None. FINDINGS: Brain: Generalized atrophy. The brainstem is normal. There may be an old small vessel left cerebellar infarction. Cerebral hemispheres show an old infarction in the right parietal cortical and subcortical brain. No sign of acute infarction, intra-axial mass lesion, hemorrhage, hydrocephalus or extra-axial collection. 7 mm left frontal dural calcification could represent a small calcified meningioma, not significant. Vascular: There is atherosclerotic calcification of the major vessels at the base of the brain. Skull: Normal Sinuses/Orbits: Clear Other: None IMPRESSION: No acute finding. Generalized atrophy. 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 Electronically Signed   By: Nelson Chimes M.D.   On: 01/30/2016 16:54   Ct Angio Neck W Or Wo Contrast  Result Date: 01/31/2016 CLINICAL DATA:  Code stroke, LEFT arm weakness now resolved. History of atrial fibrillation. EXAM: CT ANGIOGRAPHY HEAD AND NECK TECHNIQUE: Multidetector CT imaging of the head and neck was performed using the standard protocol during bolus administration of intravenous contrast. Multiplanar CT image reconstructions and MIPs were obtained to evaluate the vascular anatomy. Carotid stenosis measurements (when applicable) are obtained utilizing NASCET criteria, using the distal internal carotid diameter as the denominator. CONTRAST:  50 cc Isovue 370 COMPARISON:  MRI of the brain January 30, 2016 FINDINGS: CTA NECK AORTIC ARCH: 4 cm ascending aorta with mild calcific atherosclerosis. The origins of the innominate, left Common carotid artery and subclavian artery are widely patent. Common origin of the innominate and LEFT subclavian artery.  RIGHT CAROTID SYSTEM: Common carotid artery is widely patent, coursing in a straight line fashion. Eccentric intimal thickening and to lesser extent calcific atherosclerosis results in 9 mm segment 65-70% stenosis RIGHT internal carotid  artery within 1 cm of the origin. LEFT CAROTID SYSTEM: Common carotid artery is widely patent, coursing in a straight line fashion. Normal appearance of the carotid bifurcation without hemodynamically significant stenosis by NASCET criteria. Eccentric intimal thickening results in less than 50% stenosis LEFT internal carotid artery origin. VERTEBRAL ARTERIES:Codominant vertebral artery's. Normal appearance of the vertebral arteries, which appear widely patent. Mild extrinsic deformity due to degenerative cervical spine. SKELETON: No acute osseous process though bone windows have not been submitted. Patient is edentulous. Grade 1 C2-3 anterolisthesis associated with severe RIGHT facet arthropathy. Severe C4-5 through C6-7 degenerative discs. Severe RIGHT C2-3, moderate to severe bilateral C3-4, severe LEFT C5-6 neural foraminal narrowing. Moderate to severe C5-6 canal stenosis. Broad levoscoliosis. OTHER NECK: Soft tissues of the neck are non-acute though, not tailored for evaluation. Included view of the lung apices demonstrates severe centrilobular emphysema in LEFT apical bullous changes. No superior mediastinal lymphadenopathy. Mild medial deviation of RIGHT true vocal cord, RIGHTcricoarytenoid cartilage can be seen with vocal cord paralysis. CTA HEAD ANTERIOR CIRCULATION: Bilateral internal carotid arteries are patent. Intimal thickening results in focal at least 50% stenosis RIGHT anterior genu of the internal carotid artery. Mild calcific atherosclerosis of the carotid siphons. Developmentally small RIGHT A1 segment. Normal appearance the bilateral anterior cerebral arteries. Patent anterior communicating artery. Normal appearance the bilateral middle cerebral arteries. No large vessel occlusion, hemodynamically significant stenosis, dissection, luminal irregularity, contrast extravasation or aneurysm. POSTERIOR CIRCULATION: Normal appearance of the vertebral arteries, vertebrobasilar junction and basilar  artery, as well as main branch vessels. Moderate stenosis RIGHT P1 origin. Otherwise appearance of the posterior cerebral arteries. Robust bilateral posterior communicating arteries present. No large vessel occlusion, dissection, luminal irregularity, contrast extravasation or aneurysm. VENOUS SINUSES: Major dural venous sinuses are patent though not tailored for evaluation on this angiographic examination. ANATOMIC VARIANTS: None. DELAYED PHASE: No abnormal intracranial enhancement. IMPRESSION: CTA NECK: 65-70% stenosis RIGHT internal carotid artery origin. Less than 50% stenosis LEFT internal carotid artery origin. 4 cm ascending aorta. Recommend annual imaging followup by CTA or MRA. This recommendation follows 2010 ACCF/AHA/AATS/ACR/ASA/SCA/SCAI/SIR/STS/SVM Guidelines for the Diagnosis and Management of Patients with Thoracic Aortic Disease. Circulation. 2010; 121: [id number] Severe RIGHT C2-3 and severe LEFT C5-6 neural foraminal narrowing. Moderate to severe C5-6 canal stenosis. CTA HEAD: No emergent large vessel occlusion or high-grade stenosis. Approximately 50% stenosis RIGHT anterior genu of the internal carotid artery. Moderate stenosis RIGHT P1 origin with complete circle of Willis. Electronically Signed   By: Elon Alas M.D.   On: 01/31/2016 14:13   Mr Brain Wo Contrast  Result Date: 01/30/2016 CLINICAL DATA:  82 y/o M; transient weakness and loss of sensation within the left arm. EXAM: MRI HEAD WITHOUT CONTRAST TECHNIQUE: Multiplanar, multiecho pulse sequences of the brain and surrounding structures were obtained without intravenous contrast. COMPARISON:  Head CT dated 01/30/2016. FINDINGS: Brain: There is diffusion hyperintensity without low ADC in region of gliosis from prior infarct in the right parietal lobe consistent with T2 shine through. A single punctate focus in the right parietal lobe may demonstrate low ADC an represent an acute area of infarction (series 4, image 34). Mild  hemosiderin staining of the right parietal infarct. No additional focus of susceptibility hypointensity to indicate intracranial hemorrhage. Background of mild parenchymal volume loss and chronic microvascular ischemic  changes. Small chronic infarct in the left cerebellar hemisphere. Extra-axial space: Normal ventricular size. No midline shift. No effacement of basilar cisterns. No extra-axial collection is identified. Proximal intracranial flow voids are maintained. No abnormality of the cervical medullary junction. Other: No abnormal signal of the paranasal sinuses. Partial opacification of the right mastoid air cells. No abnormal signal of left mastoid air cells. Orbits are unremarkable. Calvarium is unremarkable. IMPRESSION: 1. Punctate focus of acute infarction in the right parietal lobe adjacent region of prior chronic infarction. 2. Background of mild chronic microvascular ischemic disease, parenchymal volume loss, and chronic infarcts in the right parietal lobe and left cerebellar hemisphere. These results were called by telephone at the time of interpretation on 01/30/2016 at 11:53 pm to Dr. Leonette Monarch, who verbally acknowledged these results. Electronically Signed   By: Kristine Garbe M.D.   On: 01/30/2016 23:54   Nm Myocar Multi W/spect W/wall Motion / Ef  Result Date: 02/02/2016 CLINICAL DATA:  Chest pain. Prior myocardial infarctions. Preop. Shortness of breath. EXAM: MYOCARDIAL IMAGING WITH SPECT (REST AND PHARMACOLOGIC-STRESS) GATED LEFT VENTRICULAR WALL MOTION STUDY LEFT VENTRICULAR EJECTION FRACTION TECHNIQUE: Standard myocardial SPECT imaging was performed after resting intravenous injection of 10 mCi Tc-39m tetrofosmin. Subsequently, intravenous infusion of Lexiscan was performed under the supervision of the Cardiology staff. At peak effect of the drug, 30 mCi Tc-91m tetrofosmin was injected intravenously and standard myocardial SPECT imaging was performed. Quantitative gated imaging was  also performed to evaluate left ventricular wall motion, and estimate left ventricular ejection fraction. COMPARISON:  Chest radiograph 01/31/2016. FINDINGS: Perfusion: Large fixed defect/ scar involving the inferior and lateral walls. No areas of reversibility to suggest inducible ischemia. Wall Motion: Diffuse hypokinesis. This is more severe involving the lateral and inferior walls. Decreased thickening in these areas. Left Ventricular Ejection Fraction: 23 % End diastolic volume 54 ml End systolic volume 41  ml IMPRESSION: 1. No reversible ischemia. Large infarct involving the inferior and lateral walls. 2. Global hypokinesis with more severe hypokinesis involving the inferior and lateral walls. 3. Left ventricular ejection fraction 23% 4. Non invasive risk stratification*: High *2012 Appropriate Use Criteria for Coronary Revascularization Focused Update: J Am Coll Cardiol. [id number]. [email or web address] Electronically Signed   By: Abigail Miyamoto M.D.   On: 02/02/2016 15:34      Jara Feider M.D on 02/03/2016 at 1:13 PM  Between 7am to 7pm - Pager - [phone]  After 7pm go to [email or web address] - password [facility]  [facility] -  Office  [phone]

## 2016-02-03 NOTE — Progress Notes (Signed)
Cardiologist: Dr. Harrington Challenger Subjective:   Overall laying flat, comfortable, no chest pain. Nothing by mouth, will wondering when test is going to get done.  Objective:  Vital Signs in the last 24 hours: Temp:  [98.2 F (36.8 C)-98.4 F (36.9 C)] 98.2 F (36.8 C) (08/20 0500) Pulse Rate:  [89-110] 96 (08/20 0500) Resp:  [18] 18 (08/20 0500) BP: (91-135)/(49-82) 102/64 (08/20 0500) SpO2:  [91 %-96 %] 96 % (08/20 0500)  Intake/Output from previous day: 08/19 0701 - 08/20 0700 In: 2200 [I.V.:2200] Out: 950 [Urine:950]   Physical Exam: General: Well developed, well nourished, in no acute distress. Head:  Normocephalic and atraumatic. Lungs: Clear to auscultation and percussion. Heart: Normal S1 and S2.  1/6 systolic murmur right upper sternal border, no rubs or gallops.  Abdomen: soft, non-tender, positive bowel sounds. Overweight Extremities: No clubbing or cyanosis. No edema. Neurologic: Alert and oriented x 3.    Lab Results:  Recent Labs  02/01/16 0539 02/03/16 0231  WBC 8.2 13.3*  HGB 15.5 16.5  PLT 158 163    Recent Labs  02/01/16 0539 02/03/16 0231  NA 138 138  K 4.2 4.3  CL 105 107  CO2 25 22  GLUCOSE 97 110*  BUN 11 18  CREATININE 1.14 1.25*   No results for input(s): TROPONINI in the last 72 hours.  Invalid input(s): CK, MB Hepatic Function Panel No results for input(s): PROT, ALBUMIN, AST, ALT, ALKPHOS, BILITOT, BILIDIR, IBILI in the last 72 hours. No results for input(s): CHOL in the last 72 hours. No results for input(s): PROTIME in the last 72 hours.  Imaging: Nm Myocar Multi W/spect W/wall Motion / Ef  Result Date: 02/02/2016 CLINICAL DATA:  Chest pain. Prior myocardial infarctions. Preop. Shortness of breath. EXAM: MYOCARDIAL IMAGING WITH SPECT (REST AND PHARMACOLOGIC-STRESS) GATED LEFT VENTRICULAR WALL MOTION STUDY LEFT VENTRICULAR EJECTION FRACTION TECHNIQUE: Standard myocardial SPECT imaging was performed after resting intravenous  injection of 10 mCi Tc-9m tetrofosmin. Subsequently, intravenous infusion of Lexiscan was performed under the supervision of the Cardiology staff. At peak effect of the drug, 30 mCi Tc-19m tetrofosmin was injected intravenously and standard myocardial SPECT imaging was performed. Quantitative gated imaging was also performed to evaluate left ventricular wall motion, and estimate left ventricular ejection fraction. COMPARISON:  Chest radiograph 01/31/2016. FINDINGS: Perfusion: Large fixed defect/ scar involving the inferior and lateral walls. No areas of reversibility to suggest inducible ischemia. Wall Motion: Diffuse hypokinesis. This is more severe involving the lateral and inferior walls. Decreased thickening in these areas. Left Ventricular Ejection Fraction: 23 % End diastolic volume 54 ml End systolic volume 41  ml IMPRESSION: 1. No reversible ischemia. Large infarct involving the inferior and lateral walls. 2. Global hypokinesis with more severe hypokinesis involving the inferior and lateral walls. 3. Left ventricular ejection fraction 23% 4. Non invasive risk stratification*: High *2012 Appropriate Use Criteria for Coronary Revascularization Focused Update: J Am Coll Cardiol. [id number]. [email or web address] Electronically Signed   By: Abigail Miyamoto M.D.   On: 02/02/2016 15:34   Personally viewed.   Telemetry: PVC's, couplets. No adverse arrhythmias. Personally viewed.   EKG:  Sinus tachycardia with first-degree AV block Personally viewed.  Cardiac Studies:  ECHO 01/31/16 - Left ventricle: The cavity size was normal. There was moderate   concentric hypertrophy. Systolic function was normal. Wall motion  was normal; there were no regional wall motion abnormalities.   Doppler parameters are consistent with a reversible restrictive   pattern, indicative of decreased left ventricular diastolic  compliance and/or increased left atrial pressure (grade  3   diastolic dysfunction). - Aortic valve: Transvalvular velocity was increased. There was   mild stenosis. Mean gradient (S): 19 mm Hg. Valve area (VTI):   0.97 cm^2. Valve area (Vmax): 0.98 cm^2. Valve area (Vmean): 0.74   cm^2. - Mitral valve: Transvalvular velocity was within the normal range.   There was no evidence for stenosis. There was no regurgitation. - Right ventricle: The cavity size was normal. Wall thickness was   normal. Systolic function was normal. - Tricuspid valve: There was no regurgitation.  Meds: Scheduled Meds: . aspirin  325 mg Oral Daily  . atorvastatin  40 mg Oral q1800  . enoxaparin (LOVENOX) injection  40 mg Subcutaneous Daily   Continuous Infusions: . sodium chloride 100 mL/hr at 02/03/16 0500   PRN Meds:.  Assessment/Plan:  Principal Problem:   TIA (transient ischemic attack) Active Problems:   Coronary arteriosclerosis due to lipid rich plaque   Elevated hemoglobin (HCC)   CVA (cerebral infarction)   HLD (hyperlipidemia)   Carotid stenosis  Preoperative cardiovascular risk stratification prior to proposed carotid endarterectomy versus carotid stenting, right.  - Nuclear stress test ordered by Dr. Harrington Challenger was high risk with EF 23% and large infarct pattern of inferolateral wall. No reversible ischemia. Given these findings, would promote cardiac cath, diagnostic prior to vascular surgery. ? Could he have developed 3 vessel CAD. Risks and benefits discussed (stroke, MI, bleeding, death), willing to proceed. I think it makes sense to delineate anatomy prior to carotid revascularization procedure.  Plan on Monday cath.   - No adverse arrhythmias on telemetry. Ejection fraction normal ? On echo but 23% on NUC?. Mild aortic stenosis should not be of any clinical consequence.  TIA/stroke  - LDL 89 however given his peripheral vascular disease/carotid artery disease I would recommend reinitiation of statin therapy. ? Rash from statin or plavix. Rare for  this to occur.   - Aspirin.  Coronary artery disease  - PTCA 2 in the late 1980s by Dr. Eustace Quail in the history of MI 2.  - Prior chest pain episodes surrounding those events. He has not had any chest discomfort or anginal symptoms. He does have shortness of breath with activity. Does not perform much activity.  Rash  - ? Source. ?atorvastatin, ?Plavix. Still present on back. Tried to restart atorvastatin yesterday. Will stop again. Per primary team.  Candee Furbish 02/03/2016, 9:44 AM

## 2016-02-04 ENCOUNTER — Encounter: Payer: Self-pay | Admitting: *Deleted

## 2016-02-04 ENCOUNTER — Encounter (HOSPITAL_COMMUNITY): Payer: Self-pay | Admitting: Internal Medicine

## 2016-02-04 ENCOUNTER — Encounter (HOSPITAL_COMMUNITY)
Admission: EM | Disposition: A | Discharge status: Home / Self Care | Payer: Self-pay | Source: Home / Self Care | Attending: Internal Medicine

## 2016-02-04 DIAGNOSIS — Z006 Encounter for examination for normal comparison and control in clinical research program: Secondary | ICD-10-CM

## 2016-02-04 DIAGNOSIS — R9439 Abnormal result of other cardiovascular function study: Secondary | ICD-10-CM

## 2016-02-04 DIAGNOSIS — I63032 Cerebral infarction due to thrombosis of left carotid artery: Secondary | ICD-10-CM

## 2016-02-04 DIAGNOSIS — I2510 Atherosclerotic heart disease of native coronary artery without angina pectoris: Secondary | ICD-10-CM

## 2016-02-04 HISTORY — PX: CARDIAC CATHETERIZATION: SHX172

## 2016-02-04 LAB — BASIC METABOLIC PANEL
Anion gap: 7 (ref 5–15)
BUN: 18 mg/dL (ref 6–20)
CO2: 22 mmol/L (ref 22–32)
Calcium: 7.8 mg/dL — ABNORMAL LOW (ref 8.9–10.3)
Chloride: 108 mmol/L (ref 101–111)
Creatinine, Ser: 1.36 mg/dL — ABNORMAL HIGH (ref 0.61–1.24)
GFR calc Af Amer: 53 mL/min — ABNORMAL LOW (ref >60)
GFR calc non Af Amer: 45 mL/min — ABNORMAL LOW (ref >60)
Glucose, Bld: 115 mg/dL — ABNORMAL HIGH (ref 65–99)
Potassium: 4.3 mmol/L (ref 3.5–5.1)
Sodium: 137 mmol/L (ref 135–145)

## 2016-02-04 LAB — CBC
HCT: 49.7 % (ref 39.0–52.0)
HCT: 52.5 % — ABNORMAL HIGH (ref 39.0–52.0)
Hemoglobin: 16.5 g/dL (ref 13.0–17.0)
Hemoglobin: 17.6 g/dL — ABNORMAL HIGH (ref 13.0–17.0)
MCH: 31.1 pg (ref 26.0–34.0)
MCH: 31.6 pg (ref 26.0–34.0)
MCHC: 33.2 g/dL (ref 30.0–36.0)
MCHC: 33.5 g/dL (ref 30.0–36.0)
MCV: 93.6 fL (ref 78.0–100.0)
MCV: 94.3 fL (ref 78.0–100.0)
Platelets: 158 10*3/uL (ref 150–400)
Platelets: 162 10*3/uL (ref 150–400)
RBC: 5.31 MIL/uL (ref 4.22–5.81)
RBC: 5.57 MIL/uL (ref 4.22–5.81)
RDW: 14.1 % (ref 11.5–15.5)
RDW: 14 % (ref 11.5–15.5)
WBC: 13.3 10*3/uL — ABNORMAL HIGH (ref 4.0–10.5)
WBC: 12.9 10*3/uL — ABNORMAL HIGH (ref 4.0–10.5)

## 2016-02-04 LAB — CREATININE, SERUM
Creatinine, Ser: 1.39 mg/dL — ABNORMAL HIGH (ref 0.61–1.24)
GFR calc Af Amer: 51 mL/min — ABNORMAL LOW (ref >60)
GFR calc non Af Amer: 44 mL/min — ABNORMAL LOW (ref >60)

## 2016-02-04 SURGERY — LEFT HEART CATH AND CORONARY ANGIOGRAPHY
Anesthesia: LOCAL

## 2016-02-04 MED ORDER — HEPARIN (PORCINE) IN NACL 2-0.9 UNIT/ML-% IJ SOLN
INTRAMUSCULAR | Status: DC | PRN
  Administered 2016-02-04: 1500 mL

## 2016-02-04 MED ORDER — SODIUM CHLORIDE 0.9 % WEIGHT BASED INFUSION
1.0000 mL/kg/h | INTRAVENOUS | Status: DC
  Administered 2016-02-04: 1 mL/kg/h via INTRAVENOUS

## 2016-02-04 MED ORDER — SODIUM CHLORIDE 0.9% FLUSH: 3.0000 mL | INTRAVENOUS | Status: DC | PRN

## 2016-02-04 MED ORDER — HEPARIN SODIUM (PORCINE) 1000 UNIT/ML IJ SOLN
INTRAMUSCULAR | Status: AC
  Filled 2016-02-04: qty 1

## 2016-02-04 MED ORDER — SODIUM CHLORIDE 0.9 % IV SOLN: 250.0000 mL | INTRAVENOUS | Status: DC | PRN

## 2016-02-04 MED ORDER — HEPARIN (PORCINE) IN NACL 2-0.9 UNIT/ML-% IJ SOLN
INTRAMUSCULAR | Status: AC
  Filled 2016-02-04: qty 1500

## 2016-02-04 MED ORDER — SODIUM CHLORIDE 0.9 % IV SOLN: INTRAVENOUS | Status: AC

## 2016-02-04 MED ORDER — SODIUM CHLORIDE 0.9% FLUSH
3.0000 mL | INTRAVENOUS | Status: DC | PRN
Start: 2016-02-04 — End: 2016-02-04

## 2016-02-04 MED ORDER — SODIUM CHLORIDE 0.9% FLUSH
3.0000 mL | Freq: Two times a day (BID) | INTRAVENOUS | Status: DC
  Administered 2016-02-04: 3 mL via INTRAVENOUS

## 2016-02-04 MED ORDER — ONDANSETRON HCL 4 MG/2ML IJ SOLN: 4.0000 mg | Freq: Four times a day (QID) | INTRAMUSCULAR | Status: DC | PRN

## 2016-02-04 MED ORDER — IOPAMIDOL (ISOVUE-370) INJECTION 76%
INTRAVENOUS | Status: AC
  Filled 2016-02-04: qty 100

## 2016-02-04 MED ORDER — VERAPAMIL HCL 2.5 MG/ML IV SOLN
INTRAVENOUS | Status: DC | PRN
  Administered 2016-02-04: 10:00:00 via INTRA_ARTERIAL

## 2016-02-04 MED ORDER — SODIUM CHLORIDE 0.9% FLUSH
3.0000 mL | Freq: Two times a day (BID) | INTRAVENOUS | Status: DC
  Administered 2016-02-04 – 2016-02-06 (×3): 3 mL via INTRAVENOUS

## 2016-02-04 MED ORDER — LIDOCAINE HCL (PF) 1 % IJ SOLN
INTRAMUSCULAR | Status: DC | PRN
  Administered 2016-02-04: 3 mL

## 2016-02-04 MED ORDER — ENOXAPARIN SODIUM 40 MG/0.4ML ~~LOC~~ SOLN
40.0000 mg | SUBCUTANEOUS | Status: DC
Start: 2016-02-05 — End: 2016-02-06
  Administered 2016-02-05 – 2016-02-06 (×2): 40 mg via SUBCUTANEOUS
  Filled 2016-02-04 (×2): qty 0.4

## 2016-02-04 MED ORDER — IOPAMIDOL (ISOVUE-370) INJECTION 76%
INTRAVENOUS | Status: DC | PRN
  Administered 2016-02-04: 80 mL via INTRA_ARTERIAL

## 2016-02-04 MED ORDER — ACETAMINOPHEN 325 MG PO TABS: 650.0000 mg | ORAL_TABLET | ORAL | Status: DC | PRN

## 2016-02-04 MED ORDER — LIDOCAINE HCL (PF) 1 % IJ SOLN
INTRAMUSCULAR | Status: AC
  Filled 2016-02-04: qty 30

## 2016-02-04 MED ORDER — VERAPAMIL HCL 2.5 MG/ML IV SOLN
INTRAVENOUS | Status: AC
  Filled 2016-02-04: qty 2

## 2016-02-04 MED ORDER — HEPARIN SODIUM (PORCINE) 1000 UNIT/ML IJ SOLN
INTRAMUSCULAR | Status: DC | PRN
  Administered 2016-02-04: 3500 [IU] via INTRAVENOUS

## 2016-02-04 MED ORDER — ASPIRIN 81 MG PO CHEW
81.0000 mg | CHEWABLE_TABLET | ORAL | Status: AC
  Administered 2016-02-04: 81 mg via ORAL
  Filled 2016-02-04: qty 1

## 2016-02-04 MED ORDER — CLOPIDOGREL BISULFATE 75 MG PO TABS
75.0000 mg | ORAL_TABLET | Freq: Every day | ORAL | Status: DC
  Administered 2016-02-04: 75 mg via ORAL
  Filled 2016-02-04: qty 1

## 2016-02-04 SURGICAL SUPPLY — 14 items
CATH INFINITI 5 FR JL3.5 (CATHETERS) ×2 IMPLANT
CATH INFINITI 5FR ANG PIGTAIL (CATHETERS) ×2 IMPLANT
CATH INFINITI JR4 5F (CATHETERS) ×2 IMPLANT
CATH LAUNCHER 5F 3DRIGHT (CATHETERS) ×1 IMPLANT
CATHETER LAUNCHER 5F 3DRIGHT (CATHETERS) ×2
DEVICE RAD COMP TR BAND LRG (VASCULAR PRODUCTS) ×2 IMPLANT
GLIDESHEATH SLEND SS 6F .021 (SHEATH) ×2 IMPLANT
KIT HEART LEFT (KITS) ×2 IMPLANT
PACK CARDIAC CATHETERIZATION (CUSTOM PROCEDURE TRAY) ×2 IMPLANT
SYR MEDRAD MARK V 150ML (SYRINGE) ×2 IMPLANT
TRANSDUCER W/STOPCOCK (MISCELLANEOUS) ×2 IMPLANT
TUBING CIL FLEX 10 FLL-RA (TUBING) ×2 IMPLANT
WIRE HI TORQ VERSACORE-J 145CM (WIRE) ×2 IMPLANT
WIRE SAFE-T 1.5MM-J .035X260CM (WIRE) ×2 IMPLANT

## 2016-02-04 NOTE — Progress Notes (Signed)
TR BAND REMOVAL  LOCATION:    Right radial  DEFLATED PER PROTOCOL:    Yes.    TIME BAND OFF / DRESSING APPLIED:   1300p  SITE UPON ARRIVAL:    Level 0  SITE AFTER BAND REMOVAL:    Level 0  CIRCULATION SENSATION AND MOVEMENT:    Within Normal Limits     COMMENTS:   Radial pulse palpable,. No complaint of discomfort at this time.  Pt able to move wrist without any problems.

## 2016-02-04 NOTE — Care Management (Signed)
Benefits check   S/W [place] @ [employer] [facility] # [phone]   BRILINTA 90 MG BID   COVER- YES  CO-PAY- $ 242.00   TIER- 3 DRUG  PRIOR APPROVAL - NO  PHARMACY : [facility] , [facility] AND [facility]    Patient can use 30 free card x 1 . Patient unable to use co pay card due to having Medicare .  Please advise CM if Brilinta or another medication will be used . Thanks   Magdalen Spatz RN BSN [phone]

## 2016-02-04 NOTE — Progress Notes (Signed)
  Progress Note    02/04/2016 8:04 AM * No surgery date entered *  Subjective:  Still has rash, worse on back  Vitals:   02/04/16 0158 02/04/16 0520  BP: 99/72 114/77  Pulse: 97 (!) 103  Resp:  16  Temp:  97.8 F (36.6 C)    Physical Exam: aaox3 Non labored respirations Palpable femoral pulses bilaterally Neuro intact with 5/5 strength  CBC    Component Value Date/Time   WBC 13.3 (H) 02/04/2016 0507   RBC 5.31 02/04/2016 0507   HGB 16.5 02/04/2016 0507   HCT 49.7 02/04/2016 0507   PLT 158 02/04/2016 0507   MCV 93.6 02/04/2016 0507   MCH 31.1 02/04/2016 0507   MCHC 33.2 02/04/2016 0507   RDW 14.1 02/04/2016 0507   LYMPHSABS 3.4 01/30/2016 1604   MONOABS 0.9 01/30/2016 1604   EOSABS 0.1 01/30/2016 1604   BASOSABS 0.1 01/30/2016 1604    BMET    Component Value Date/Time   NA 137 02/04/2016 0507   K 4.3 02/04/2016 0507   CL 108 02/04/2016 0507   CO2 22 02/04/2016 0507   GLUCOSE 115 (H) 02/04/2016 0507   BUN 18 02/04/2016 0507   CREATININE 1.36 (H) 02/04/2016 0507   CALCIUM 7.8 (L) 02/04/2016 0507   GFRNONAA 45 (L) 02/04/2016 0507   GFRAA 53 (L) 02/04/2016 0507    INR    Component Value Date/Time   INR 1.04 01/30/2016 1604     Intake/Output Summary (Last 24 hours) at 02/04/16 0804 Last data filed at 02/03/16 0840  Gross per 24 hour  Intake              240 ml  Output                0 ml  Net              240 ml     Assessment:  82 y.o. male is here with R parietal cva and 70% R carotid stenosis, planned for cardiac cath today  Plan: -will f/u results of cardiac cath  -plan for R carotid stent vs cea pending results today -on asa, if plavix allergy suspected can be transitioned to brilinta. Would need dual antiplatelet for consideration of carotid stent    Cowen Pesqueira C. Donzetta Matters, MD [facility] Office: [phone] Pager: [phone]  02/04/2016 8:04 AM

## 2016-02-04 NOTE — Interval H&P Note (Signed)
History and Physical Interval Note:  02/04/2016 9:23 AM  David Bradshaw  has presented today for surgery, with the diagnosis of abnormal nuclear stress test The various methods of treatment have been discussed with the patient and family. After consideration of risks, benefits and other options for treatment, the patient has consented to  Procedure(s): Left Heart Cath and Coronary Angiography (N/A) and possible angioplasty as a surgical intervention .  The patient's history has been reviewed, patient examined, no change in status, stable for surgery.  I have reviewed the patient's chart and labs.  Questions were answered to the patient's satisfaction.     Zachary Nole, Quillian Quince

## 2016-02-04 NOTE — Interval H&P Note (Signed)
Cath Lab Visit (complete for each Cath Lab visit)  Clinical Evaluation Leading to the Procedure:   ACS: No.  Non-ACS:    Anginal Classification: CCS II  Anti-ischemic medical therapy: Minimal Therapy (1 class of medications)  Non-Invasive Test Results: High-risk stress test findings: cardiac mortality >3%/year  Prior CABG: No previous CABG      History and Physical Interval Note:  02/04/2016 9:23 AM  David Bradshaw  has presented today for surgery, with the diagnosis of unstable angina  The various methods of treatment have been discussed with the patient and family. After consideration of risks, benefits and other options for treatment, the patient has consented to  Procedure(s): Left Heart Cath and Coronary Angiography (N/A) as a surgical intervention .  The patient's history has been reviewed, patient examined, no change in status, stable for surgery.  I have reviewed the patient's chart and labs.  Questions were answered to the patient's satisfaction.     Cristhian Vanhook, Quillian Quince

## 2016-02-04 NOTE — Progress Notes (Signed)
PROGRESS NOTE                                                                                                                                                                                                             Patient Demographics:    David Bradshaw, is a 82 y.o. male, DOB - [birth year], [id number]  Admit date - 01/30/2016   Admitting Physician Albertine Patricia, MD  Outpatient Primary MD for the patient is No PCP Per Patient  LOS - 4   Chief Complaint  Patient presents with  . Extremity Weakness       Brief Narrative   82 year old male who presents to ED with left arm numbness, workup significant for acute CVA, And right ICA stenosis, Seen by vascular surgery, planned for endarterectomy versus stenting, cardiology consulted for clearance, Stress test high risk, with EF 23% with large infarct pattern, And for cardiac cath 8/21, significant for CAD total occlusion of proximal RCA with copiour left to right and right to right collaterals   Subjective:    [name] today has, No headache, No chest pain, No abdominal pain - No Nausea, No Cough ,Reports less itching.   Assessment  & Plan :    Principal Problem:   TIA (transient ischemic attack) Active Problems:   Coronary arteriosclerosis due to lipid rich plaque   Elevated hemoglobin (HCC)   Cerebral infarction due to stenosis of right carotid artery (HCC)   HLD (hyperlipidemia)   Carotid stenosis   Rash and nonspecific skin eruption   Abnormal nuclear stress test   Acute ischemic CVA - MRI with evidence of punctate right parietal lobe infarct. -  2-D echo with no embolic source, - CTA head and neck significant for ICA stenosis 65-70%,  - Lower extremity venous Doppler with no evidence of DVT - Recommendation is for dual antiplatelet therapy for 3 months, then continue Plavix alone ,Will resume patient back on Plavix today and monitor for rash. - LDL 89, Lipitor is on hold  secondary to rash - Glycohemoglobin A1c 5.8  Right ICA high-grade stenosis - Vascular surgery consulted, and for endarterectomy versus stenting, cardiology consulted for clearance - Status post cardiac cath 8/21 significant for CAD with chronic total occlusion of proximal RCA with copiour left to right and right to right collaterals, patient with stable CAD anatomy, okay  to proceed with surgery from CAD standpoint by cardiology. - Patient with possible Plavix allergy, so decision about surgery determines if he can tolerate Plavix, one to be able to afford Brilinta given significant co-pay, discussed with Dr. Donzetta Matters, will start Plavix again today, and reassess his rash.  Hyperlipidemia - LDL 89, continue to hold statin as possibly contributing to his rash  CAD - Continue with aspirin - Patient with high risk stress test on 8/19, cardiac cath 8/21  Erythema -  appears to be having a drug rash, involving his back and abdomen, extending to the periphery, started recently on Plavix and Lipitor will continue to hold Lipitor, will resume Plavix today and reassess rash tomorrow, as well likely will need Plavix if planning for ICA stenting side.      Code Status : Full  Family Communication  : Discussed with son at bedside  Disposition Plan  : Pending decision about endarterectomy   Consults  :  neuro  Procedures  : none  DVT Prophylaxis  :  Lovenox  Lab Results  Component Value Date   PLT 162 02/04/2016    Antibiotics  :    Anti-infectives    None        Objective:   Vitals:   02/04/16 1220 02/04/16 1230 02/04/16 1300 02/04/16 1512  BP: 111/69 95/69 101/64 107/80  Pulse: 98 95 90 86  Resp: (!) 24 (!) 22 20 18   Temp:    97.5 F (36.4 C)  TempSrc:    Oral  SpO2: 93% 94% 93% 98%  Weight:      Height:        Wt Readings from Last 3 Encounters:  02/04/16 74.8 kg (164 lb 14.4 oz)    No intake or output data in the 24 hours ending 02/04/16 1611   Physical  Exam  Awake Alert, Oriented X 3, No new F.N deficits, Normal affect [place].AT,PERRAL Supple Neck,No JVD, No cervical lymphadenopathy appriciated.  Symmetrical Chest wall movement, Good air movement bilaterally, CTAB RRR,No Gallops,Rubs or new Murmurs, No Parasternal Heave +ve B.Sounds, Abd Soft, No tenderness, No organomegaly appriciated, No rebound - guarding or rigidity. No Cyanosis, Clubbing or edema,  erythematous rash on his back, abdomen, extended to  proximal periphery.    Data Review:    CBC  Recent Labs Lab 01/30/16 1604  01/31/16 0434 02/01/16 0539 02/03/16 0231 02/04/16 0507 02/04/16 1452  WBC 10.6*  --  10.5 8.2 13.3* 13.3* 12.9*  HGB 18.3*  < > 16.7 15.5 16.5 16.5 17.6*  HCT 53.7*  < > 49.1 47.0 49.3 49.7 52.5*  PLT 184  --  164 158 163 158 162  MCV 93.1  --  92.5 93.1 93.9 93.6 94.3  MCH 31.7  --  31.5 30.7 31.4 31.1 31.6  MCHC 34.1  --  34.0 33.0 33.5 33.2 33.5  RDW 13.7  --  13.8 13.8 13.9 14.1 14.0  LYMPHSABS 3.4  --   --   --   --   --   --   MONOABS 0.9  --   --   --   --   --   --   EOSABS 0.1  --   --   --   --   --   --   BASOSABS 0.1  --   --   --   --   --   --   < > = values in this interval not displayed.  Chemistries   Recent Labs  Lab 01/30/16 1604 01/30/16 1632 02/01/16 0539 02/03/16 0231 02/04/16 0507 02/04/16 1452  NA 138 141 138 138 137  --   K 4.9 4.6 4.2 4.3 4.3  --   CL 107 105 105 107 108  --   CO2 24  --  25 22 22   --   GLUCOSE 140* 134* 97 110* 115*  --   BUN 13 17 11 18 18   --   CREATININE 1.20 1.00 1.14 1.25* 1.36* 1.39*  CALCIUM 9.2  --  8.4* 8.0* 7.8*  --   AST 43*  --   --   --   --   --   ALT 30  --   --   --   --   --   ALKPHOS 87  --   --   --   --   --   BILITOT 1.0  --   --   --   --   --    ------------------------------------------------------------------------------------------------------------------ No results for input(s): CHOL, HDL, LDLCALC, TRIG, CHOLHDL, LDLDIRECT in the last 72 hours.  Lab Results   Component Value Date   HGBA1C 5.8 (H) 01/31/2016   ------------------------------------------------------------------------------------------------------------------ No results for input(s): TSH, T4TOTAL, T3FREE, THYROIDAB in the last 72 hours.  Invalid input(s): FREET3 ------------------------------------------------------------------------------------------------------------------ No results for input(s): VITAMINB12, FOLATE, FERRITIN, TIBC, IRON, RETICCTPCT in the last 72 hours.  Coagulation profile  Recent Labs Lab 01/30/16 1604  INR 1.04    No results for input(s): DDIMER in the last 72 hours.  Cardiac Enzymes No results for input(s): CKMB, TROPONINI, MYOGLOBIN in the last 168 hours.  Invalid input(s): CK ------------------------------------------------------------------------------------------------------------------ No results found for: BNP  Inpatient Medications  Scheduled Meds: . aspirin  325 mg Oral Daily  . clopidogrel  75 mg Oral Daily  . [START ON 02/05/2016] enoxaparin (LOVENOX) injection  40 mg Subcutaneous Q24H  . sodium chloride flush  3 mL Intravenous Q12H   Continuous Infusions:   PRN Meds:.  Micro Results No results found for this or any previous visit (from the past 240 hour(s)).  Radiology Reports Ct Angio Head W Or Wo Contrast  Result Date: 01/31/2016 CLINICAL DATA:  Code stroke, LEFT arm weakness now resolved. History of atrial fibrillation. EXAM: CT ANGIOGRAPHY HEAD AND NECK TECHNIQUE: Multidetector CT imaging of the head and neck was performed using the standard protocol during bolus administration of intravenous contrast. Multiplanar CT image reconstructions and MIPs were obtained to evaluate the vascular anatomy. Carotid stenosis measurements (when applicable) are obtained utilizing NASCET criteria, using the distal internal carotid diameter as the denominator. CONTRAST:  50 cc Isovue 370 COMPARISON:  MRI of the brain January 30, 2016 FINDINGS:  CTA NECK AORTIC ARCH: 4 cm ascending aorta with mild calcific atherosclerosis. The origins of the innominate, left Common carotid artery and subclavian artery are widely patent. Common origin of the innominate and LEFT subclavian artery. RIGHT CAROTID SYSTEM: Common carotid artery is widely patent, coursing in a straight line fashion. Eccentric intimal thickening and to lesser extent calcific atherosclerosis results in 9 mm segment 65-70% stenosis RIGHT internal carotid artery within 1 cm of the origin. LEFT CAROTID SYSTEM: Common carotid artery is widely patent, coursing in a straight line fashion. Normal appearance of the carotid bifurcation without hemodynamically significant stenosis by NASCET criteria. Eccentric intimal thickening results in less than 50% stenosis LEFT internal carotid artery origin. VERTEBRAL ARTERIES:Codominant vertebral artery's. Normal appearance of the vertebral arteries, which appear widely patent. Mild extrinsic deformity due to degenerative cervical spine. SKELETON:  No acute osseous process though bone windows have not been submitted. Patient is edentulous. Grade 1 C2-3 anterolisthesis associated with severe RIGHT facet arthropathy. Severe C4-5 through C6-7 degenerative discs. Severe RIGHT C2-3, moderate to severe bilateral C3-4, severe LEFT C5-6 neural foraminal narrowing. Moderate to severe C5-6 canal stenosis. Broad levoscoliosis. OTHER NECK: Soft tissues of the neck are non-acute though, not tailored for evaluation. Included view of the lung apices demonstrates severe centrilobular emphysema in LEFT apical bullous changes. No superior mediastinal lymphadenopathy. Mild medial deviation of RIGHT true vocal cord, RIGHTcricoarytenoid cartilage can be seen with vocal cord paralysis. CTA HEAD ANTERIOR CIRCULATION: Bilateral internal carotid arteries are patent. Intimal thickening results in focal at least 50% stenosis RIGHT anterior genu of the internal carotid artery. Mild calcific  atherosclerosis of the carotid siphons. Developmentally small RIGHT A1 segment. Normal appearance the bilateral anterior cerebral arteries. Patent anterior communicating artery. Normal appearance the bilateral middle cerebral arteries. No large vessel occlusion, hemodynamically significant stenosis, dissection, luminal irregularity, contrast extravasation or aneurysm. POSTERIOR CIRCULATION: Normal appearance of the vertebral arteries, vertebrobasilar junction and basilar artery, as well as main branch vessels. Moderate stenosis RIGHT P1 origin. Otherwise appearance of the posterior cerebral arteries. Robust bilateral posterior communicating arteries present. No large vessel occlusion, dissection, luminal irregularity, contrast extravasation or aneurysm. VENOUS SINUSES: Major dural venous sinuses are patent though not tailored for evaluation on this angiographic examination. ANATOMIC VARIANTS: None. DELAYED PHASE: No abnormal intracranial enhancement. IMPRESSION: CTA NECK: 65-70% stenosis RIGHT internal carotid artery origin. Less than 50% stenosis LEFT internal carotid artery origin. 4 cm ascending aorta. Recommend annual imaging followup by CTA or MRA. This recommendation follows 2010 ACCF/AHA/AATS/ACR/ASA/SCA/SCAI/SIR/STS/SVM Guidelines for the Diagnosis and Management of Patients with Thoracic Aortic Disease. Circulation. 2010; 121: [id number] Severe RIGHT C2-3 and severe LEFT C5-6 neural foraminal narrowing. Moderate to severe C5-6 canal stenosis. CTA HEAD: No emergent large vessel occlusion or high-grade stenosis. Approximately 50% stenosis RIGHT anterior genu of the internal carotid artery. Moderate stenosis RIGHT P1 origin with complete circle of Willis. Electronically Signed   By: Elon Alas M.D.   On: 01/31/2016 14:13   Dg Chest 2 View  Result Date: 01/31/2016 CLINICAL DATA:  Acute onset of left-sided weakness. Initial encounter. EXAM: CHEST  2 VIEW COMPARISON:  Chest radiograph performed  11/01/2007 FINDINGS: The lungs are well-aerated. Mild left basilar atelectasis is noted. Mild peribronchial thickening is seen. There is no evidence of pleural effusion or pneumothorax. Emphysematous change is noted at the lung apices. The heart is borderline normal in size. No acute osseous abnormalities are seen. IMPRESSION: Mild left basilar atelectasis noted. Mild peribronchial thickening seen. Emphysematous change noted at the lung apices. Electronically Signed   By: Garald Balding M.D.   On: 01/31/2016 00:46   Ct Head Wo Contrast  Result Date: 01/30/2016 CLINICAL DATA:  Left arm numbness and nausea up earlier today which has result. EXAM: CT HEAD WITHOUT CONTRAST TECHNIQUE: Contiguous axial images were obtained from the base of the skull through the vertex without intravenous contrast. COMPARISON:  None. FINDINGS: Brain: Generalized atrophy. The brainstem is normal. There may be an old small vessel left cerebellar infarction. Cerebral hemispheres show an old infarction in the right parietal cortical and subcortical brain. No sign of acute infarction, intra-axial mass lesion, hemorrhage, hydrocephalus or extra-axial collection. 7 mm left frontal dural calcification could represent a small calcified meningioma, not significant. Vascular: There is atherosclerotic calcification of the major vessels at the base of the brain. Skull: Normal Sinuses/Orbits: Clear Other: None  IMPRESSION: No acute finding. Generalized atrophy. 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 Electronically Signed   By: Nelson Chimes M.D.   On: 01/30/2016 16:54   Ct Angio Neck W Or Wo Contrast  Result Date: 01/31/2016 CLINICAL DATA:  Code stroke, LEFT arm weakness now resolved. History of atrial fibrillation. EXAM: CT ANGIOGRAPHY HEAD AND NECK TECHNIQUE: Multidetector CT imaging of the head and neck was performed using the standard protocol during bolus administration of intravenous contrast. Multiplanar CT image  reconstructions and MIPs were obtained to evaluate the vascular anatomy. Carotid stenosis measurements (when applicable) are obtained utilizing NASCET criteria, using the distal internal carotid diameter as the denominator. CONTRAST:  50 cc Isovue 370 COMPARISON:  MRI of the brain January 30, 2016 FINDINGS: CTA NECK AORTIC ARCH: 4 cm ascending aorta with mild calcific atherosclerosis. The origins of the innominate, left Common carotid artery and subclavian artery are widely patent. Common origin of the innominate and LEFT subclavian artery. RIGHT CAROTID SYSTEM: Common carotid artery is widely patent, coursing in a straight line fashion. Eccentric intimal thickening and to lesser extent calcific atherosclerosis results in 9 mm segment 65-70% stenosis RIGHT internal carotid artery within 1 cm of the origin. LEFT CAROTID SYSTEM: Common carotid artery is widely patent, coursing in a straight line fashion. Normal appearance of the carotid bifurcation without hemodynamically significant stenosis by NASCET criteria. Eccentric intimal thickening results in less than 50% stenosis LEFT internal carotid artery origin. VERTEBRAL ARTERIES:Codominant vertebral artery's. Normal appearance of the vertebral arteries, which appear widely patent. Mild extrinsic deformity due to degenerative cervical spine. SKELETON: No acute osseous process though bone windows have not been submitted. Patient is edentulous. Grade 1 C2-3 anterolisthesis associated with severe RIGHT facet arthropathy. Severe C4-5 through C6-7 degenerative discs. Severe RIGHT C2-3, moderate to severe bilateral C3-4, severe LEFT C5-6 neural foraminal narrowing. Moderate to severe C5-6 canal stenosis. Broad levoscoliosis. OTHER NECK: Soft tissues of the neck are non-acute though, not tailored for evaluation. Included view of the lung apices demonstrates severe centrilobular emphysema in LEFT apical bullous changes. No superior mediastinal lymphadenopathy. Mild medial  deviation of RIGHT true vocal cord, RIGHTcricoarytenoid cartilage can be seen with vocal cord paralysis. CTA HEAD ANTERIOR CIRCULATION: Bilateral internal carotid arteries are patent. Intimal thickening results in focal at least 50% stenosis RIGHT anterior genu of the internal carotid artery. Mild calcific atherosclerosis of the carotid siphons. Developmentally small RIGHT A1 segment. Normal appearance the bilateral anterior cerebral arteries. Patent anterior communicating artery. Normal appearance the bilateral middle cerebral arteries. No large vessel occlusion, hemodynamically significant stenosis, dissection, luminal irregularity, contrast extravasation or aneurysm. POSTERIOR CIRCULATION: Normal appearance of the vertebral arteries, vertebrobasilar junction and basilar artery, as well as main branch vessels. Moderate stenosis RIGHT P1 origin. Otherwise appearance of the posterior cerebral arteries. Robust bilateral posterior communicating arteries present. No large vessel occlusion, dissection, luminal irregularity, contrast extravasation or aneurysm. VENOUS SINUSES: Major dural venous sinuses are patent though not tailored for evaluation on this angiographic examination. ANATOMIC VARIANTS: None. DELAYED PHASE: No abnormal intracranial enhancement. IMPRESSION: CTA NECK: 65-70% stenosis RIGHT internal carotid artery origin. Less than 50% stenosis LEFT internal carotid artery origin. 4 cm ascending aorta. Recommend annual imaging followup by CTA or MRA. This recommendation follows 2010 ACCF/AHA/AATS/ACR/ASA/SCA/SCAI/SIR/STS/SVM Guidelines for the Diagnosis and Management of Patients with Thoracic Aortic Disease. Circulation. 2010; 121: [id number] Severe RIGHT C2-3 and severe LEFT C5-6 neural foraminal narrowing. Moderate to severe C5-6 canal stenosis. CTA HEAD: No emergent large vessel occlusion or high-grade stenosis. Approximately 50%  stenosis RIGHT anterior genu of the internal carotid artery. Moderate stenosis  RIGHT P1 origin with complete circle of Willis. Electronically Signed   By: Elon Alas M.D.   On: 01/31/2016 14:13   Mr Brain Wo Contrast  Result Date: 01/30/2016 CLINICAL DATA:  82 y/o M; transient weakness and loss of sensation within the left arm. EXAM: MRI HEAD WITHOUT CONTRAST TECHNIQUE: Multiplanar, multiecho pulse sequences of the brain and surrounding structures were obtained without intravenous contrast. COMPARISON:  Head CT dated 01/30/2016. FINDINGS: Brain: There is diffusion hyperintensity without low ADC in region of gliosis from prior infarct in the right parietal lobe consistent with T2 shine through. A single punctate focus in the right parietal lobe may demonstrate low ADC an represent an acute area of infarction (series 4, image 34). Mild hemosiderin staining of the right parietal infarct. No additional focus of susceptibility hypointensity to indicate intracranial hemorrhage. Background of mild parenchymal volume loss and chronic microvascular ischemic changes. Small chronic infarct in the left cerebellar hemisphere. Extra-axial space: Normal ventricular size. No midline shift. No effacement of basilar cisterns. No extra-axial collection is identified. Proximal intracranial flow voids are maintained. No abnormality of the cervical medullary junction. Other: No abnormal signal of the paranasal sinuses. Partial opacification of the right mastoid air cells. No abnormal signal of left mastoid air cells. Orbits are unremarkable. Calvarium is unremarkable. IMPRESSION: 1. Punctate focus of acute infarction in the right parietal lobe adjacent region of prior chronic infarction. 2. Background of mild chronic microvascular ischemic disease, parenchymal volume loss, and chronic infarcts in the right parietal lobe and left cerebellar hemisphere. These results were called by telephone at the time of interpretation on 01/30/2016 at 11:53 pm to Dr. Leonette Monarch, who verbally acknowledged these results.  Electronically Signed   By: Kristine Garbe M.D.   On: 01/30/2016 23:54   Nm Myocar Multi W/spect W/wall Motion / Ef  Result Date: 02/02/2016 CLINICAL DATA:  Chest pain. Prior myocardial infarctions. Preop. Shortness of breath. EXAM: MYOCARDIAL IMAGING WITH SPECT (REST AND PHARMACOLOGIC-STRESS) GATED LEFT VENTRICULAR WALL MOTION STUDY LEFT VENTRICULAR EJECTION FRACTION TECHNIQUE: Standard myocardial SPECT imaging was performed after resting intravenous injection of 10 mCi Tc-64m tetrofosmin. Subsequently, intravenous infusion of Lexiscan was performed under the supervision of the Cardiology staff. At peak effect of the drug, 30 mCi Tc-51m tetrofosmin was injected intravenously and standard myocardial SPECT imaging was performed. Quantitative gated imaging was also performed to evaluate left ventricular wall motion, and estimate left ventricular ejection fraction. COMPARISON:  Chest radiograph 01/31/2016. FINDINGS: Perfusion: Large fixed defect/ scar involving the inferior and lateral walls. No areas of reversibility to suggest inducible ischemia. Wall Motion: Diffuse hypokinesis. This is more severe involving the lateral and inferior walls. Decreased thickening in these areas. Left Ventricular Ejection Fraction: 23 % End diastolic volume 54 ml End systolic volume 41  ml IMPRESSION: 1. No reversible ischemia. Large infarct involving the inferior and lateral walls. 2. Global hypokinesis with more severe hypokinesis involving the inferior and lateral walls. 3. Left ventricular ejection fraction 23% 4. Non invasive risk stratification*: High *2012 Appropriate Use Criteria for Coronary Revascularization Focused Update: J Am Coll Cardiol. [id number]. [email or web address]://[email or web address] Electronically Signed   By: Abigail Miyamoto M.D.   On: 02/02/2016 15:34      Ozias Dicenzo M.D on 02/04/2016 at 4:11 PM  Between 7am to 7pm - Pager - [phone]  After 7pm go to  [email or web address] - password [facility]  [facility] -  Office  [phone]

## 2016-02-04 NOTE — Progress Notes (Signed)
First contact with patient on Friday Aug. 18, 2017.  Presented STROKE AF study to patient and his son. Answered all questions that they had.  I left them a copy of the ICF so they could review and speak with two additional children over the weekend.  This afternoon followed up with patient and son who said they were not interested in the research study.

## 2016-02-04 NOTE — H&P (View-Only) (Signed)
Cardiologist: Dr. Harrington Challenger Subjective:   Overall laying flat, comfortable, no chest pain. Nothing by mouth, will wondering when test is going to get done.  Objective:  Vital Signs in the last 24 hours: Temp:  [98.2 F (36.8 C)-98.4 F (36.9 C)] 98.2 F (36.8 C) (08/20 0500) Pulse Rate:  [89-110] 96 (08/20 0500) Resp:  [18] 18 (08/20 0500) BP: (91-135)/(49-82) 102/64 (08/20 0500) SpO2:  [91 %-96 %] 96 % (08/20 0500)  Intake/Output from previous day: 08/19 0701 - 08/20 0700 In: 2200 [I.V.:2200] Out: 950 [Urine:950]   Physical Exam: General: Well developed, well nourished, in no acute distress. Head:  Normocephalic and atraumatic. Lungs: Clear to auscultation and percussion. Heart: Normal S1 and S2.  1/6 systolic murmur right upper sternal border, no rubs or gallops.  Abdomen: soft, non-tender, positive bowel sounds. Overweight Extremities: No clubbing or cyanosis. No edema. Neurologic: Alert and oriented x 3.    Lab Results:  Recent Labs  02/01/16 0539 02/03/16 0231  WBC 8.2 13.3*  HGB 15.5 16.5  PLT 158 163    Recent Labs  02/01/16 0539 02/03/16 0231  NA 138 138  K 4.2 4.3  CL 105 107  CO2 25 22  GLUCOSE 97 110*  BUN 11 18  CREATININE 1.14 1.25*   No results for input(s): TROPONINI in the last 72 hours.  Invalid input(s): CK, MB Hepatic Function Panel No results for input(s): PROT, ALBUMIN, AST, ALT, ALKPHOS, BILITOT, BILIDIR, IBILI in the last 72 hours. No results for input(s): CHOL in the last 72 hours. No results for input(s): PROTIME in the last 72 hours.  Imaging: Nm Myocar Multi W/spect W/wall Motion / Ef  Result Date: 02/02/2016 CLINICAL DATA:  Chest pain. Prior myocardial infarctions. Preop. Shortness of breath. EXAM: MYOCARDIAL IMAGING WITH SPECT (REST AND PHARMACOLOGIC-STRESS) GATED LEFT VENTRICULAR WALL MOTION STUDY LEFT VENTRICULAR EJECTION FRACTION TECHNIQUE: Standard myocardial SPECT imaging was performed after resting intravenous  injection of 10 mCi Tc-9m tetrofosmin. Subsequently, intravenous infusion of Lexiscan was performed under the supervision of the Cardiology staff. At peak effect of the drug, 30 mCi Tc-72m tetrofosmin was injected intravenously and standard myocardial SPECT imaging was performed. Quantitative gated imaging was also performed to evaluate left ventricular wall motion, and estimate left ventricular ejection fraction. COMPARISON:  Chest radiograph 01/31/2016. FINDINGS: Perfusion: Large fixed defect/ scar involving the inferior and lateral walls. No areas of reversibility to suggest inducible ischemia. Wall Motion: Diffuse hypokinesis. This is more severe involving the lateral and inferior walls. Decreased thickening in these areas. Left Ventricular Ejection Fraction: 23 % End diastolic volume 54 ml End systolic volume 41  ml IMPRESSION: 1. No reversible ischemia. Large infarct involving the inferior and lateral walls. 2. Global hypokinesis with more severe hypokinesis involving the inferior and lateral walls. 3. Left ventricular ejection fraction 23% 4. Non invasive risk stratification*: High *2012 Appropriate Use Criteria for Coronary Revascularization Focused Update: J Am Coll Cardiol. [id number]. [email or web address] Electronically Signed   By: Abigail Miyamoto M.D.   On: 02/02/2016 15:34   Personally viewed.   Telemetry: PVC's, couplets. No adverse arrhythmias. Personally viewed.   EKG:  Sinus tachycardia with first-degree AV block Personally viewed.  Cardiac Studies:  ECHO 01/31/16 - Left ventricle: The cavity size was normal. There was moderate   concentric hypertrophy. Systolic function was normal. Wall motion  was normal; there were no regional wall motion abnormalities.   Doppler parameters are consistent with a reversible restrictive   pattern, indicative of decreased left ventricular diastolic  compliance and/or increased left atrial pressure (grade  3   diastolic dysfunction). - Aortic valve: Transvalvular velocity was increased. There was   mild stenosis. Mean gradient (S): 19 mm Hg. Valve area (VTI):   0.97 cm^2. Valve area (Vmax): 0.98 cm^2. Valve area (Vmean): 0.74   cm^2. - Mitral valve: Transvalvular velocity was within the normal range.   There was no evidence for stenosis. There was no regurgitation. - Right ventricle: The cavity size was normal. Wall thickness was   normal. Systolic function was normal. - Tricuspid valve: There was no regurgitation.  Meds: Scheduled Meds: . aspirin  325 mg Oral Daily  . atorvastatin  40 mg Oral q1800  . enoxaparin (LOVENOX) injection  40 mg Subcutaneous Daily   Continuous Infusions: . sodium chloride 100 mL/hr at 02/03/16 0500   PRN Meds:.  Assessment/Plan:  Principal Problem:   TIA (transient ischemic attack) Active Problems:   Coronary arteriosclerosis due to lipid rich plaque   Elevated hemoglobin (HCC)   CVA (cerebral infarction)   HLD (hyperlipidemia)   Carotid stenosis  Preoperative cardiovascular risk stratification prior to proposed carotid endarterectomy versus carotid stenting, right.  - Nuclear stress test ordered by Dr. Harrington Challenger was high risk with EF 23% and large infarct pattern of inferolateral wall. No reversible ischemia. Given these findings, would promote cardiac cath, diagnostic prior to vascular surgery. ? Could he have developed 3 vessel CAD. Risks and benefits discussed (stroke, MI, bleeding, death), willing to proceed. I think it makes sense to delineate anatomy prior to carotid revascularization procedure.  Plan on Monday cath.   - No adverse arrhythmias on telemetry. Ejection fraction normal ? On echo but 23% on NUC?. Mild aortic stenosis should not be of any clinical consequence.  TIA/stroke  - LDL 89 however given his peripheral vascular disease/carotid artery disease I would recommend reinitiation of statin therapy. ? Rash from statin or plavix. Rare for  this to occur.   - Aspirin.  Coronary artery disease  - PTCA 2 in the late 1980s by Dr. Eustace Quail in the history of MI 2.  - Prior chest pain episodes surrounding those events. He has not had any chest discomfort or anginal symptoms. He does have shortness of breath with activity. Does not perform much activity.  Rash  - ? Source. ?atorvastatin, ?Plavix. Still present on back. Tried to restart atorvastatin yesterday. Will stop again. Per primary team.  Candee Furbish 02/03/2016, 9:44 AM

## 2016-02-05 DIAGNOSIS — I63031 Cerebral infarction due to thrombosis of right carotid artery: Secondary | ICD-10-CM

## 2016-02-05 DIAGNOSIS — R931 Abnormal findings on diagnostic imaging of heart and coronary circulation: Secondary | ICD-10-CM

## 2016-02-05 DIAGNOSIS — I639 Cerebral infarction, unspecified: Secondary | ICD-10-CM

## 2016-02-05 LAB — CBC
HCT: 51.3 % (ref 39.0–52.0)
Hemoglobin: 17.5 g/dL — ABNORMAL HIGH (ref 13.0–17.0)
MCH: 31.9 pg (ref 26.0–34.0)
MCHC: 34.1 g/dL (ref 30.0–36.0)
MCV: 93.6 fL (ref 78.0–100.0)
Platelets: 175 10*3/uL (ref 150–400)
RBC: 5.48 MIL/uL (ref 4.22–5.81)
RDW: 14 % (ref 11.5–15.5)
WBC: 14 10*3/uL — ABNORMAL HIGH (ref 4.0–10.5)

## 2016-02-05 LAB — BASIC METABOLIC PANEL
Anion gap: 6 (ref 5–15)
BUN: 16 mg/dL (ref 6–20)
CO2: 22 mmol/L (ref 22–32)
Calcium: 7.8 mg/dL — ABNORMAL LOW (ref 8.9–10.3)
Chloride: 110 mmol/L (ref 101–111)
Creatinine, Ser: 1.36 mg/dL — ABNORMAL HIGH (ref 0.61–1.24)
GFR calc Af Amer: 53 mL/min — ABNORMAL LOW (ref >60)
GFR calc non Af Amer: 45 mL/min — ABNORMAL LOW (ref >60)
Glucose, Bld: 98 mg/dL (ref 65–99)
Potassium: 4.4 mmol/L (ref 3.5–5.1)
Sodium: 138 mmol/L (ref 135–145)

## 2016-02-05 MED ORDER — ASPIRIN EC 81 MG PO TBEC
81.0000 mg | DELAYED_RELEASE_TABLET | Freq: Every day | ORAL | Status: DC
  Administered 2016-02-05 – 2016-02-06 (×2): 81 mg via ORAL
  Filled 2016-02-05 (×2): qty 1

## 2016-02-05 MED ORDER — TICAGRELOR 90 MG PO TABS
90.0000 mg | ORAL_TABLET | Freq: Two times a day (BID) | ORAL | Status: DC
  Administered 2016-02-05 – 2016-02-06 (×3): 90 mg via ORAL
  Filled 2016-02-05 (×3): qty 1

## 2016-02-05 MED ORDER — METHYLPREDNISOLONE SODIUM SUCC 125 MG IJ SOLR
80.0000 mg | Freq: Once | INTRAMUSCULAR | Status: AC
Start: 2016-02-05 — End: 2016-02-05
  Administered 2016-02-05: 80 mg via INTRAVENOUS
  Filled 2016-02-05: qty 2

## 2016-02-05 MED ORDER — SODIUM CHLORIDE 0.9 % IV SOLN
INTRAVENOUS | Status: AC
  Administered 2016-02-05: 13:00:00 via INTRAVENOUS

## 2016-02-05 MED ORDER — DIPHENHYDRAMINE HCL 50 MG/ML IJ SOLN
12.5000 mg | Freq: Once | INTRAMUSCULAR | Status: AC
Start: 2016-02-05 — End: 2016-02-05
  Administered 2016-02-05: 12.5 mg via INTRAVENOUS
  Filled 2016-02-05: qty 1

## 2016-02-05 NOTE — Care Management Important Message (Signed)
Important Message  Patient Details  Name: David Bradshaw MRN: [id number] Date of Birth: 1926/07/06   Medicare Important Message Given:  Yes    Loann Quill 02/05/2016, 10:17 AM

## 2016-02-05 NOTE — Progress Notes (Signed)
   Cardiologist: Dr. Harrington Challenger Subjective:   No chest pain; no dyspnea  Objective:  Vital Signs in the last 24 hours: Temp:  [97.5 F (36.4 C)-98.7 F (37.1 C)] 98.7 F (37.1 C) (08/21 2138) Pulse Rate:  [0-100] 100 (08/21 2138) Resp:  [15-26] 18 (08/21 2138) BP: (87-132)/(58-93) 87/58 (08/21 2138) SpO2:  [0 %-100 %] 96 % (08/21 2138)  Intake/Output from previous day: No intake/output data recorded.   Physical Exam: General: Well developed, well nourished, in no acute distress. Neck: supple Head:  Normal Lungs: CTA Heart: RRR Abdomen: soft, non-tender, non-distended. Extremities: No edema. Neurologic: Alert and oriented x 3.    Lab Results:  Recent Labs  02/04/16 1452 02/05/16 0243  WBC 12.9* 14.0*  HGB 17.6* 17.5*  PLT 162 175    Recent Labs  02/04/16 0507 02/04/16 1452 02/05/16 0243  NA 137  --  138  K 4.3  --  4.4  CL 108  --  110  CO2 22  --  22  GLUCOSE 115*  --  98  BUN 18  --  16  CREATININE 1.36* 1.39* 1.36*    Telemetry: Sinus  Cardiac Studies:  ECHO 01/31/16 - Left ventricle: The cavity size was normal. There was moderate   concentric hypertrophy. Systolic function was normal. Wall motion  was normal; there were no regional wall motion abnormalities.   Doppler parameters are consistent with a reversible restrictive   pattern, indicative of decreased left ventricular diastolic   compliance and/or increased left atrial pressure (grade 3   diastolic dysfunction). - Aortic valve: Transvalvular velocity was increased. There was   mild stenosis. Mean gradient (S): 19 mm Hg. Valve area (VTI):   0.97 cm^2. Valve area (Vmax): 0.98 cm^2. Valve area (Vmean): 0.74   cm^2. - Mitral valve: Transvalvular velocity was within the normal range.   There was no evidence for stenosis. There was no regurgitation. - Right ventricle: The cavity size was normal. Wall thickness was   normal. Systolic function was normal. - Tricuspid valve: There was no  regurgitation.  Meds: Scheduled Meds: . aspirin  325 mg Oral Daily  . enoxaparin (LOVENOX) injection  40 mg Subcutaneous Q24H  . sodium chloride flush  3 mL Intravenous Q12H   Continuous Infusions:   PRN Meds:.  Assessment/Plan:  Principal Problem:   TIA (transient ischemic attack) Active Problems:   Coronary arteriosclerosis due to lipid rich plaque   Elevated hemoglobin (HCC)   Cerebral infarction due to stenosis of right carotid artery (HCC)   HLD (hyperlipidemia)   Carotid stenosis   Rash and nonspecific skin eruption   Abnormal nuclear stress test  Preoperative cardiovascular risk stratification prior to proposed carotid endarterectomy versus carotid stenting, right.  - Cath results noted; occluded RCA and nonobstructive CAD otherwise; EF 35-45. Ok for CEA from cardiac standpoint.  TIA/stroke  - continue ASA; no plavix (possible rash); add statin later if rash improves.  Coronary artery disease  - continue ASA.  Rash  - ? Source. Likely plavix related-DCed; follow.  Dilated aorta Will need fu as outpt  Aortic stenosis Mild  Acute kidney disease Possible contrast nephropathy; follow renal function.  Kirk Ruths 02/05/2016, 8:57 AM

## 2016-02-05 NOTE — Progress Notes (Signed)
PROGRESS NOTE                                                                                                                                                                                                             Patient Demographics:    David Bradshaw, is a 82 y.o. male, DOB - September 25, 1926, [id number]  Admit date - 01/30/2016   Admitting Physician Albertine Patricia, MD  Outpatient Primary MD for the patient is No PCP Per Patient  LOS - 5   Chief Complaint  Patient presents with  . Extremity Weakness       Brief Narrative   82 year old male who presents to ED with left arm numbness, workup significant for acute CVA, And right ICA stenosis, Seen by vascular surgery, planned for endarterectomy versus stenting, cardiology consulted for clearance, Stress test high risk, with EF 23% with large infarct pattern, And for cardiac cath 8/21, significant for CAD total occlusion of proximal RCA with copiour left to right and right to right collaterals   Subjective:    David Bradshaw today has, No headache, No chest pain, No abdominal pain - No Nausea, No Cough ,Still reports pruritus.   Assessment  & Plan :    Principal Problem:   TIA (transient ischemic attack) Active Problems:   Coronary arteriosclerosis due to lipid rich plaque   Elevated hemoglobin (HCC)   Cerebral infarction due to stenosis of right carotid artery (HCC)   HLD (hyperlipidemia)   Carotid stenosis   Rash and nonspecific skin eruption   Abnormal nuclear stress test   Acute ischemic CVA - MRI with evidence of punctate right parietal lobe infarct. -  2-D echo with no embolic source, - CTA head and neck significant for ICA stenosis 65-70%,  - Lower extremity venous Doppler with no evidence of DVT - Recommendation is for dual antiplatelet therapy for 3 months, then continue Plavix alone ,Unfortunately patient has rash most likely related to Plavix , worsening after rechallenge  him with Plavix . - LDL 89, Lipitor is on hold secondary to rash - Glycohemoglobin A1c 5.8  Right ICA high-grade stenosis - Vascular surgery consulted, and for endarterectomy versus stenting, cardiology consulted for clearance - Status post cardiac cath 8/21 significant for CAD with chronic total occlusion of proximal RCA with copiour left to right and right to right collaterals,  patient with stable CAD anatomy, okay to proceed with surgery from CAD standpoint by cardiology. - Patient with possible Plavix allergy, worsening rash overnight after rechallenge with Plavix. - Discussed with vascular surgery, will start on Brilinta, will need 30 days postoperatively after stent, which he can use 30 days frequent, and depends on schedule availability, possible ICA stenting tomorrow, if not tomorrow, can be done next week as an outpatient.  Hyperlipidemia - LDL 89, continue to hold statin as possibly contributing to his rash  CAD - Continue with aspirin - Patient with high risk stress test on 8/19,  cardiac cath 8/21, significant for CAD total occlusion of proximal RCA with copiour left to right and right to right collaterals   Erythema -  appears to be having a drug rash, involving his back and abdomen, extending to the periphery, started recently on Plavix and Lipitor will continue to hold Lipitor, worsening rash overnight after his human Plavix, so Plavix was stopped, rash most likely due to Plavix allergy . - Worsening rash this a.m., stop Plavix, will give one dose of IV steroids, continue with when necessary Benadryl.  Sinus tachycardia - Patient will mild sinus tachycardia this a.m., will continue with IV fluids 75 mL for next 10 hours.      Code Status : Full  Family Communication  : Discussed with son at bedside  Disposition Plan  : Pending decision about endarterectomy   Consults  :  neuro  Procedures  : none  DVT Prophylaxis  :  Lovenox  Lab Results  Component Value Date     PLT 175 02/05/2016    Antibiotics  :    Anti-infectives    None        Objective:   Vitals:   02/04/16 1230 02/04/16 1300 02/04/16 1512 02/04/16 2138  BP: 95/69 101/64 107/80 (!) 87/58  Pulse: 95 90 86 100  Resp: (!) 22 20 18 18   Temp:   97.5 F (36.4 C) 98.7 F (37.1 C)  TempSrc:   Oral Oral  SpO2: 94% 93% 98% 96%  Weight:      Height:        Wt Readings from Last 3 Encounters:  02/04/16 74.8 kg (164 lb 14.4 oz)    No intake or output data in the 24 hours ending 02/05/16 1045   Physical Exam  Awake Alert, Oriented X 3, No new F.N deficits, Normal affect [place].AT,PERRAL Supple Neck,No JVD, No cervical lymphadenopathy appriciated.  Symmetrical Chest wall movement, Good air movement bilaterally, CTAB RRR,No Gallops,Rubs or new Murmurs, No Parasternal Heave +ve B.Sounds, Abd Soft, No tenderness, No organomegaly appriciated, No rebound - guarding or rigidity. No Cyanosis, Clubbing or edema,  worsening erythematous rash today, with extending to distal extremities.    Data Review:    CBC  Recent Labs Lab 01/30/16 1604  02/01/16 0539 02/03/16 0231 02/04/16 0507 02/04/16 1452 02/05/16 0243  WBC 10.6*  < > 8.2 13.3* 13.3* 12.9* 14.0*  HGB 18.3*  < > 15.5 16.5 16.5 17.6* 17.5*  HCT 53.7*  < > 47.0 49.3 49.7 52.5* 51.3  PLT 184  < > 158 163 158 162 175  MCV 93.1  < > 93.1 93.9 93.6 94.3 93.6  MCH 31.7  < > 30.7 31.4 31.1 31.6 31.9  MCHC 34.1  < > 33.0 33.5 33.2 33.5 34.1  RDW 13.7  < > 13.8 13.9 14.1 14.0 14.0  LYMPHSABS 3.4  --   --   --   --   --   --  MONOABS 0.9  --   --   --   --   --   --   EOSABS 0.1  --   --   --   --   --   --   BASOSABS 0.1  --   --   --   --   --   --   < > = values in this interval not displayed.  Chemistries   Recent Labs Lab 01/30/16 1604 01/30/16 1632 02/01/16 0539 02/03/16 0231 02/04/16 0507 02/04/16 1452 02/05/16 0243  NA 138 141 138 138 137  --  138  K 4.9 4.6 4.2 4.3 4.3  --  4.4  CL 107 105 105 107 108  --   110  CO2 24  --  25 22 22   --  22  GLUCOSE 140* 134* 97 110* 115*  --  98  BUN 13 17 11 18 18   --  16  CREATININE 1.20 1.00 1.14 1.25* 1.36* 1.39* 1.36*  CALCIUM 9.2  --  8.4* 8.0* 7.8*  --  7.8*  AST 43*  --   --   --   --   --   --   ALT 30  --   --   --   --   --   --   ALKPHOS 87  --   --   --   --   --   --   BILITOT 1.0  --   --   --   --   --   --    ------------------------------------------------------------------------------------------------------------------ No results for input(s): CHOL, HDL, LDLCALC, TRIG, CHOLHDL, LDLDIRECT in the last 72 hours.  Lab Results  Component Value Date   HGBA1C 5.8 (H) 01/31/2016   ------------------------------------------------------------------------------------------------------------------ No results for input(s): TSH, T4TOTAL, T3FREE, THYROIDAB in the last 72 hours.  Invalid input(s): FREET3 ------------------------------------------------------------------------------------------------------------------ No results for input(s): VITAMINB12, FOLATE, FERRITIN, TIBC, IRON, RETICCTPCT in the last 72 hours.  Coagulation profile  Recent Labs Lab 01/30/16 1604  INR 1.04    No results for input(s): DDIMER in the last 72 hours.  Cardiac Enzymes No results for input(s): CKMB, TROPONINI, MYOGLOBIN in the last 168 hours.  Invalid input(s): CK ------------------------------------------------------------------------------------------------------------------ No results found for: BNP  Inpatient Medications  Scheduled Meds: . aspirin  325 mg Oral Daily  . enoxaparin (LOVENOX) injection  40 mg Subcutaneous Q24H  . sodium chloride flush  3 mL Intravenous Q12H   Continuous Infusions:   PRN Meds:.  Micro Results No results found for this or any previous visit (from the past 240 hour(s)).  Radiology Reports Ct Angio Head W Or Wo Contrast  Result Date: 01/31/2016 CLINICAL DATA:  Code stroke, LEFT arm weakness now resolved. History  of atrial fibrillation. EXAM: CT ANGIOGRAPHY HEAD AND NECK TECHNIQUE: Multidetector CT imaging of the head and neck was performed using the standard protocol during bolus administration of intravenous contrast. Multiplanar CT image reconstructions and MIPs were obtained to evaluate the vascular anatomy. Carotid stenosis measurements (when applicable) are obtained utilizing NASCET criteria, using the distal internal carotid diameter as the denominator. CONTRAST:  50 cc Isovue 370 COMPARISON:  MRI of the brain January 30, 2016 FINDINGS: CTA NECK AORTIC ARCH: 4 cm ascending aorta with mild calcific atherosclerosis. The origins of the innominate, left Common carotid artery and subclavian artery are widely patent. Common origin of the innominate and LEFT subclavian artery. RIGHT CAROTID SYSTEM: Common carotid artery is widely patent, coursing in a straight line fashion. Eccentric intimal  thickening and to lesser extent calcific atherosclerosis results in 9 mm segment 65-70% stenosis RIGHT internal carotid artery within 1 cm of the origin. LEFT CAROTID SYSTEM: Common carotid artery is widely patent, coursing in a straight line fashion. Normal appearance of the carotid bifurcation without hemodynamically significant stenosis by NASCET criteria. Eccentric intimal thickening results in less than 50% stenosis LEFT internal carotid artery origin. VERTEBRAL ARTERIES:Codominant vertebral artery's. Normal appearance of the vertebral arteries, which appear widely patent. Mild extrinsic deformity due to degenerative cervical spine. SKELETON: No acute osseous process though bone windows have not been submitted. Patient is edentulous. Grade 1 C2-3 anterolisthesis associated with severe RIGHT facet arthropathy. Severe C4-5 through C6-7 degenerative discs. Severe RIGHT C2-3, moderate to severe bilateral C3-4, severe LEFT C5-6 neural foraminal narrowing. Moderate to severe C5-6 canal stenosis. Broad levoscoliosis. OTHER NECK: Soft  tissues of the neck are non-acute though, not tailored for evaluation. Included view of the lung apices demonstrates severe centrilobular emphysema in LEFT apical bullous changes. No superior mediastinal lymphadenopathy. Mild medial deviation of RIGHT true vocal cord, RIGHTcricoarytenoid cartilage can be seen with vocal cord paralysis. CTA HEAD ANTERIOR CIRCULATION: Bilateral internal carotid arteries are patent. Intimal thickening results in focal at least 50% stenosis RIGHT anterior genu of the internal carotid artery. Mild calcific atherosclerosis of the carotid siphons. Developmentally small RIGHT A1 segment. Normal appearance the bilateral anterior cerebral arteries. Patent anterior communicating artery. Normal appearance the bilateral middle cerebral arteries. No large vessel occlusion, hemodynamically significant stenosis, dissection, luminal irregularity, contrast extravasation or aneurysm. POSTERIOR CIRCULATION: Normal appearance of the vertebral arteries, vertebrobasilar junction and basilar artery, as well as main branch vessels. Moderate stenosis RIGHT P1 origin. Otherwise appearance of the posterior cerebral arteries. Robust bilateral posterior communicating arteries present. No large vessel occlusion, dissection, luminal irregularity, contrast extravasation or aneurysm. VENOUS SINUSES: Major dural venous sinuses are patent though not tailored for evaluation on this angiographic examination. ANATOMIC VARIANTS: None. DELAYED PHASE: No abnormal intracranial enhancement. IMPRESSION: CTA NECK: 65-70% stenosis RIGHT internal carotid artery origin. Less than 50% stenosis LEFT internal carotid artery origin. 4 cm ascending aorta. Recommend annual imaging followup by CTA or MRA. This recommendation follows 2010 ACCF/AHA/AATS/ACR/ASA/SCA/SCAI/SIR/STS/SVM Guidelines for the Diagnosis and Management of Patients with Thoracic Aortic Disease. Circulation. 2010; 121: [id number] Severe RIGHT C2-3 and severe LEFT C5-6  neural foraminal narrowing. Moderate to severe C5-6 canal stenosis. CTA HEAD: No emergent large vessel occlusion or high-grade stenosis. Approximately 50% stenosis RIGHT anterior genu of the internal carotid artery. Moderate stenosis RIGHT P1 origin with complete circle of Willis. Electronically Signed   By: Elon Alas M.D.   On: 01/31/2016 14:13   Dg Chest 2 View  Result Date: 01/31/2016 CLINICAL DATA:  Acute onset of left-sided weakness. Initial encounter. EXAM: CHEST  2 VIEW COMPARISON:  Chest radiograph performed 11/01/2007 FINDINGS: The lungs are well-aerated. Mild left basilar atelectasis is noted. Mild peribronchial thickening is seen. There is no evidence of pleural effusion or pneumothorax. Emphysematous change is noted at the lung apices. The heart is borderline normal in size. No acute osseous abnormalities are seen. IMPRESSION: Mild left basilar atelectasis noted. Mild peribronchial thickening seen. Emphysematous change noted at the lung apices. Electronically Signed   By: Garald Balding M.D.   On: 01/31/2016 00:46   Ct Head Wo Contrast  Result Date: 01/30/2016 CLINICAL DATA:  Left arm numbness and nausea up earlier today which has result. EXAM: CT HEAD WITHOUT CONTRAST TECHNIQUE: Contiguous axial images were obtained from the base of the skull  through the vertex without intravenous contrast. COMPARISON:  None. FINDINGS: Brain: Generalized atrophy. The brainstem is normal. There may be an old small vessel left cerebellar infarction. Cerebral hemispheres show an old infarction in the right parietal cortical and subcortical brain. No sign of acute infarction, intra-axial mass lesion, hemorrhage, hydrocephalus or extra-axial collection. 7 mm left frontal dural calcification could represent a small calcified meningioma, not significant. Vascular: There is atherosclerotic calcification of the major vessels at the base of the brain. Skull: Normal Sinuses/Orbits: Clear Other: None IMPRESSION: No  acute finding. Generalized atrophy. 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 Electronically Signed   By: Nelson Chimes M.D.   On: 01/30/2016 16:54   Ct Angio Neck W Or Wo Contrast  Result Date: 01/31/2016 CLINICAL DATA:  Code stroke, LEFT arm weakness now resolved. History of atrial fibrillation. EXAM: CT ANGIOGRAPHY HEAD AND NECK TECHNIQUE: Multidetector CT imaging of the head and neck was performed using the standard protocol during bolus administration of intravenous contrast. Multiplanar CT image reconstructions and MIPs were obtained to evaluate the vascular anatomy. Carotid stenosis measurements (when applicable) are obtained utilizing NASCET criteria, using the distal internal carotid diameter as the denominator. CONTRAST:  50 cc Isovue 370 COMPARISON:  MRI of the brain January 30, 2016 FINDINGS: CTA NECK AORTIC ARCH: 4 cm ascending aorta with mild calcific atherosclerosis. The origins of the innominate, left Common carotid artery and subclavian artery are widely patent. Common origin of the innominate and LEFT subclavian artery. RIGHT CAROTID SYSTEM: Common carotid artery is widely patent, coursing in a straight line fashion. Eccentric intimal thickening and to lesser extent calcific atherosclerosis results in 9 mm segment 65-70% stenosis RIGHT internal carotid artery within 1 cm of the origin. LEFT CAROTID SYSTEM: Common carotid artery is widely patent, coursing in a straight line fashion. Normal appearance of the carotid bifurcation without hemodynamically significant stenosis by NASCET criteria. Eccentric intimal thickening results in less than 50% stenosis LEFT internal carotid artery origin. VERTEBRAL ARTERIES:Codominant vertebral artery's. Normal appearance of the vertebral arteries, which appear widely patent. Mild extrinsic deformity due to degenerative cervical spine. SKELETON: No acute osseous process though bone windows have not been submitted. Patient is edentulous. Grade 1 C2-3  anterolisthesis associated with severe RIGHT facet arthropathy. Severe C4-5 through C6-7 degenerative discs. Severe RIGHT C2-3, moderate to severe bilateral C3-4, severe LEFT C5-6 neural foraminal narrowing. Moderate to severe C5-6 canal stenosis. Broad levoscoliosis. OTHER NECK: Soft tissues of the neck are non-acute though, not tailored for evaluation. Included view of the lung apices demonstrates severe centrilobular emphysema in LEFT apical bullous changes. No superior mediastinal lymphadenopathy. Mild medial deviation of RIGHT true vocal cord, RIGHTcricoarytenoid cartilage can be seen with vocal cord paralysis. CTA HEAD ANTERIOR CIRCULATION: Bilateral internal carotid arteries are patent. Intimal thickening results in focal at least 50% stenosis RIGHT anterior genu of the internal carotid artery. Mild calcific atherosclerosis of the carotid siphons. Developmentally small RIGHT A1 segment. Normal appearance the bilateral anterior cerebral arteries. Patent anterior communicating artery. Normal appearance the bilateral middle cerebral arteries. No large vessel occlusion, hemodynamically significant stenosis, dissection, luminal irregularity, contrast extravasation or aneurysm. POSTERIOR CIRCULATION: Normal appearance of the vertebral arteries, vertebrobasilar junction and basilar artery, as well as main branch vessels. Moderate stenosis RIGHT P1 origin. Otherwise appearance of the posterior cerebral arteries. Robust bilateral posterior communicating arteries present. No large vessel occlusion, dissection, luminal irregularity, contrast extravasation or aneurysm. VENOUS SINUSES: Major dural venous sinuses are patent though not tailored for evaluation on this angiographic examination.  ANATOMIC VARIANTS: None. DELAYED PHASE: No abnormal intracranial enhancement. IMPRESSION: CTA NECK: 65-70% stenosis RIGHT internal carotid artery origin. Less than 50% stenosis LEFT internal carotid artery origin. 4 cm ascending aorta.  Recommend annual imaging followup by CTA or MRA. This recommendation follows 2010 ACCF/AHA/AATS/ACR/ASA/SCA/SCAI/SIR/STS/SVM Guidelines for the Diagnosis and Management of Patients with Thoracic Aortic Disease. Circulation. 2010; 121: [id number] Severe RIGHT C2-3 and severe LEFT C5-6 neural foraminal narrowing. Moderate to severe C5-6 canal stenosis. CTA HEAD: No emergent large vessel occlusion or high-grade stenosis. Approximately 50% stenosis RIGHT anterior genu of the internal carotid artery. Moderate stenosis RIGHT P1 origin with complete circle of Willis. Electronically Signed   By: Elon Alas M.D.   On: 01/31/2016 14:13   Mr Brain Wo Contrast  Result Date: 01/30/2016 CLINICAL DATA:  82 y/o M; transient weakness and loss of sensation within the left arm. EXAM: MRI HEAD WITHOUT CONTRAST TECHNIQUE: Multiplanar, multiecho pulse sequences of the brain and surrounding structures were obtained without intravenous contrast. COMPARISON:  Head CT dated 01/30/2016. FINDINGS: Brain: There is diffusion hyperintensity without low ADC in region of gliosis from prior infarct in the right parietal lobe consistent with T2 shine through. A single punctate focus in the right parietal lobe may demonstrate low ADC an represent an acute area of infarction (series 4, image 34). Mild hemosiderin staining of the right parietal infarct. No additional focus of susceptibility hypointensity to indicate intracranial hemorrhage. Background of mild parenchymal volume loss and chronic microvascular ischemic changes. Small chronic infarct in the left cerebellar hemisphere. Extra-axial space: Normal ventricular size. No midline shift. No effacement of basilar cisterns. No extra-axial collection is identified. Proximal intracranial flow voids are maintained. No abnormality of the cervical medullary junction. Other: No abnormal signal of the paranasal sinuses. Partial opacification of the right mastoid air cells. No abnormal signal of  left mastoid air cells. Orbits are unremarkable. Calvarium is unremarkable. IMPRESSION: 1. Punctate focus of acute infarction in the right parietal lobe adjacent region of prior chronic infarction. 2. Background of mild chronic microvascular ischemic disease, parenchymal volume loss, and chronic infarcts in the right parietal lobe and left cerebellar hemisphere. These results were called by telephone at the time of interpretation on 01/30/2016 at 11:53 pm to Dr. Leonette Monarch, who verbally acknowledged these results. Electronically Signed   By: Kristine Garbe M.D.   On: 01/30/2016 23:54   Nm Myocar Multi W/spect W/wall Motion / Ef  Result Date: 02/02/2016 CLINICAL DATA:  Chest pain. Prior myocardial infarctions. Preop. Shortness of breath. EXAM: MYOCARDIAL IMAGING WITH SPECT (REST AND PHARMACOLOGIC-STRESS) GATED LEFT VENTRICULAR WALL MOTION STUDY LEFT VENTRICULAR EJECTION FRACTION TECHNIQUE: Standard myocardial SPECT imaging was performed after resting intravenous injection of 10 mCi Tc-3m tetrofosmin. Subsequently, intravenous infusion of Lexiscan was performed under the supervision of the Cardiology staff. At peak effect of the drug, 30 mCi Tc-39m tetrofosmin was injected intravenously and standard myocardial SPECT imaging was performed. Quantitative gated imaging was also performed to evaluate left ventricular wall motion, and estimate left ventricular ejection fraction. COMPARISON:  Chest radiograph 01/31/2016. FINDINGS: Perfusion: Large fixed defect/ scar involving the inferior and lateral walls. No areas of reversibility to suggest inducible ischemia. Wall Motion: Diffuse hypokinesis. This is more severe involving the lateral and inferior walls. Decreased thickening in these areas. Left Ventricular Ejection Fraction: 23 % End diastolic volume 54 ml End systolic volume 41  ml IMPRESSION: 1. No reversible ischemia. Large infarct involving the inferior and lateral walls. 2. Global hypokinesis with more  severe hypokinesis involving the  inferior and lateral walls. 3. Left ventricular ejection fraction 23% 4. Non invasive risk stratification*: High *2012 Appropriate Use Criteria for Coronary Revascularization Focused Update: J Am Coll Cardiol. [id number]. http://content.airportbarriers.com.aspx?articleid=[email or web address] Electronically Signed   By: Abigail Miyamoto M.D.   On: 02/02/2016 15:34      Elim Peale M.D on 02/05/2016 at 10:45 AM  Between 7am to 7pm - Pager - [phone]  After 7pm go to [email or web address] - password [facility]  [facility] -  Office  [phone]

## 2016-02-05 NOTE — Progress Notes (Signed)
NIHSS completed with a score of 0.

## 2016-02-05 NOTE — Progress Notes (Signed)
  Progress Note    02/05/2016 1:02 PM 1 Day Post-Op  Subjective:  Still has rash today  Vitals:   02/04/16 1512 02/04/16 2138  BP: 107/80 (!) 87/58  Pulse: 86 100  Resp: 18 18  Temp: 97.5 F (36.4 C) 98.7 F (37.1 C)    Physical Exam: Neuro in tact Rash persists on chest/back Palpable femoral pulses bilaterally  CBC    Component Value Date/Time   WBC 14.0 (H) 02/05/2016 0243   RBC 5.48 02/05/2016 0243   HGB 17.5 (H) 02/05/2016 0243   HCT 51.3 02/05/2016 0243   PLT 175 02/05/2016 0243   MCV 93.6 02/05/2016 0243   MCH 31.9 02/05/2016 0243   MCHC 34.1 02/05/2016 0243   RDW 14.0 02/05/2016 0243   LYMPHSABS 3.4 01/30/2016 1604   MONOABS 0.9 01/30/2016 1604   EOSABS 0.1 01/30/2016 1604   BASOSABS 0.1 01/30/2016 1604    BMET    Component Value Date/Time   NA 138 02/05/2016 0243   K 4.4 02/05/2016 0243   CL 110 02/05/2016 0243   CO2 22 02/05/2016 0243   GLUCOSE 98 02/05/2016 0243   BUN 16 02/05/2016 0243   CREATININE 1.36 (H) 02/05/2016 0243   CALCIUM 7.8 (L) 02/05/2016 0243   GFRNONAA 45 (L) 02/05/2016 0243   GFRAA 53 (L) 02/05/2016 0243    INR    Component Value Date/Time   INR 1.04 01/30/2016 1604    No intake or output data in the 24 hours ending 02/05/16 1302   Assessment:  82 y.o. male is admitted with R parietal cva and severe stenosis. Plavix causing rash changed to brilinta. Will need dual antiplatelet therapy with asa/brilinta. Plan for carotid stent next Tuesday.   Ashten Sarnowski C. Donzetta Matters, MD [facility] Office: [phone] Pager: [phone]  02/05/2016 1:02 PM

## 2016-02-06 LAB — BASIC METABOLIC PANEL
Anion gap: 5 (ref 5–15)
BUN: 19 mg/dL (ref 6–20)
CO2: 22 mmol/L (ref 22–32)
Calcium: 7.9 mg/dL — ABNORMAL LOW (ref 8.9–10.3)
Chloride: 110 mmol/L (ref 101–111)
Creatinine, Ser: 1.39 mg/dL — ABNORMAL HIGH (ref 0.61–1.24)
GFR calc Af Amer: 51 mL/min — ABNORMAL LOW (ref >60)
GFR calc non Af Amer: 44 mL/min — ABNORMAL LOW (ref >60)
Glucose, Bld: 137 mg/dL — ABNORMAL HIGH (ref 65–99)
Potassium: 4.7 mmol/L (ref 3.5–5.1)
Sodium: 137 mmol/L (ref 135–145)

## 2016-02-06 LAB — CBC
HCT: 46.8 % (ref 39.0–52.0)
Hemoglobin: 15.7 g/dL (ref 13.0–17.0)
MCH: 31.1 pg (ref 26.0–34.0)
MCHC: 33.5 g/dL (ref 30.0–36.0)
MCV: 92.7 fL (ref 78.0–100.0)
Platelets: 178 10*3/uL (ref 150–400)
RBC: 5.05 MIL/uL (ref 4.22–5.81)
RDW: 13.9 % (ref 11.5–15.5)
WBC: 19.4 10*3/uL — ABNORMAL HIGH (ref 4.0–10.5)

## 2016-02-06 MED ORDER — TICAGRELOR 90 MG PO TABS: 90.0000 mg | ORAL_TABLET | Freq: Two times a day (BID) | ORAL | 0 refills | Status: DC

## 2016-02-06 MED ORDER — ASPIRIN EC 325 MG PO TBEC: 325.0000 mg | DELAYED_RELEASE_TABLET | Freq: Every day | ORAL | 0 refills | Status: DC

## 2016-02-06 NOTE — Progress Notes (Signed)
  Progress Note    02/06/2016 7:44 AM 2 Days Post-Op  Subjective:  No issues overnight  Vitals:   02/05/16 1928 02/06/16 0604  BP: 100/66 97/75  Pulse: (!) 101 99  Resp: 20 20  Temp: 98.6 F (37 C) 97.5 F (36.4 C)    Physical Exam: Neuro in tact Rash persists on chest/back Palpable femoral pulses bilaterally   CBC    Component Value Date/Time   WBC 19.4 (H) 02/06/2016 0302   RBC 5.05 02/06/2016 0302   HGB 15.7 02/06/2016 0302   HCT 46.8 02/06/2016 0302   PLT 178 02/06/2016 0302   MCV 92.7 02/06/2016 0302   MCH 31.1 02/06/2016 0302   MCHC 33.5 02/06/2016 0302   RDW 13.9 02/06/2016 0302   LYMPHSABS 3.4 01/30/2016 1604   MONOABS 0.9 01/30/2016 1604   EOSABS 0.1 01/30/2016 1604   BASOSABS 0.1 01/30/2016 1604    BMET    Component Value Date/Time   NA 137 02/06/2016 0302   K 4.7 02/06/2016 0302   CL 110 02/06/2016 0302   CO2 22 02/06/2016 0302   GLUCOSE 137 (H) 02/06/2016 0302   BUN 19 02/06/2016 0302   CREATININE 1.39 (H) 02/06/2016 0302   CALCIUM 7.9 (L) 02/06/2016 0302   GFRNONAA 44 (L) 02/06/2016 0302   GFRAA 51 (L) 02/06/2016 0302    INR    Component Value Date/Time   INR 1.04 01/30/2016 1604     Intake/Output Summary (Last 24 hours) at 02/06/16 0744 Last data filed at 02/06/16 0100  Gross per 24 hour  Intake              660 ml  Output                5 ml  Net              655 ml     Assessment:  82 y.o. male admitted with R parietal infarct and 70% stenosis R carotid. Coronary evaluation completed and cleared for surgery. Allergy to plavix with torso rash. Elevated wbc this a.m.  Plan: Carotid stenting next Tuesday Continue asa/brilinta   Dyami Umbach C. Donzetta Matters, MD [facility] Office: [phone] Pager: [phone]  02/06/2016 7:44 AM

## 2016-02-06 NOTE — Care Management Note (Signed)
Case Management Note Marvetta Gibbons RN, BSN Unit 2W-Case Manager [phone]  Patient Details  Name: David Bradshaw MRN: [id number] Date of Birth: 1927/03/06  Subjective/Objective:    Pt admitted with TIA/CVA                Action/Plan: PTA pt lived at home- referral received for Brilinta assistance and HHPT- pt for d/c home today- in to speak with pt and son at bedside- copay for Brilinta per insurance check is $18- son aware- and hopeful that pt will only need it for a month then change to high dose ASA, plan for CEA next Tues. Choice offered for HHPT- per pt he states he does not need HH and politely declines a HH referral and any services. 30 day free card given to son for Brilinta, per son pt has access to both cane and walker if needed- no further CM needs noted.   Expected Discharge Date:  02/06/16               Expected Discharge Plan:  [place]  In-House Referral:     Discharge planning Services  CM Consult, Medication Assistance  Post Acute Care Choice:  Home Health Choice offered to:  Patient, Adult Children  DME Arranged:    DME Agency:     HH Arranged:  PT, Patient Refused [place] Agency:     Status of Service:  Completed, signed off  If discussed at [place] of Stay Meetings, dates discussed:    Additional Comments:  Dawayne Patricia, RN 02/06/2016, 1:40 PM

## 2016-02-06 NOTE — Discharge Summary (Signed)
David Bradshaw, is a 82 y.o. male  DOB 02-06-1927  MRN [id number].  Admission date:  01/30/2016  Admitting Physician  Albertine Patricia, MD  Discharge Date:  02/06/2016   Primary MD  No PCP Per Patient  Recommendations for primary care physician for things to follow:  - please check CBC, BMP during next visit. - To follow with dr Donzetta Matters from Vascular surgery regarding ICA stent scheduled for next Tuesday - Follow with neurology Dr. Erlinda Hong in 8 weeks   Admission Diagnosis  Other specified transient cerebral ischemias [G45.8] Polycythemia [D75.1] Stroke [facility]) [I63.9] Left arm numbness [R20.8]   Discharge Diagnosis  Other specified transient cerebral ischemias [G45.8] Polycythemia [D75.1] Stroke ([place]) [I63.9] Left arm numbness [R20.8]    Principal Problem:   TIA (transient ischemic attack) Active Problems:   Coronary arteriosclerosis due to lipid rich plaque   Elevated hemoglobin (HCC)   Cerebral infarction due to stenosis of right carotid artery (HCC)   HLD (hyperlipidemia)   Carotid stenosis   Rash and nonspecific skin eruption   Abnormal nuclear stress test   Stroke [facility])      Past Medical History:  Diagnosis Date  . Coronary arteriosclerosis due to lipid rich plaque 01/30/2016  . Myocardial infarct ([place])    1988 and 1990    Past Surgical History:  Procedure Laterality Date  . CARDIAC CATHETERIZATION N/A 02/04/2016   Procedure: Left Heart Cath and Coronary Angiography;  Surgeon: Jolaine Artist, MD;  Location: [facility];  Service: Cardiovascular;  Laterality: N/A;       History of present illness and  Hospital Course:     Kindly see H&P for history of present illness and admission details, please review complete Labs, Consult reports and Test reports for all details in brief  HPI  from the history and physical done on the day of admission 01/30/2016 HPI: David Bradshaw  is a 82 y.o. male with a past medical history significant for CAD s/p MI in [place] who presents with transient left arm weakness/numbness.  The patient lives alone and was watching television today when he had sudden onset of left arm numbness and "it felt dead" and "I couldn't use it".  He went outside and rubbed it and eventually it resolved after about a 15 or 20 minute period.  He had no other confusion, LOC, seizure, weakness, speech disturbance, numbness, or focal weakness.  He called his son who said his speech seemed normal, but asked him to go get checked out.  ED course: -Afebrile, heart rate initially 90s, respirations and blood pressure normal -Na 138, K 4.9, Cr 1.2 (baseline 1.2), WBC 10.6K, Hgb 18.3 (previously 17 in 2008), coags and troponin normal -CT head showed old infarct, ECG showed sinus tachycardia and 1st deg AVB with sinus rhythm -Neuro evaluated the patient and thought it was likely TIA, and TRH were asked to evaluate for admission     Hospital Course   82 year old male who presents to ED with left arm  numbness, workup significant for acute CVA, And right ICA stenosis, Seen by vascular surgery, planned for endarterectomy versus stenting, cardiology consulted for clearance, Stress test high risk, with EF 23% with large infarct pattern, And for cardiac cath 8/21, significant for CAD total occlusion of proximal RCA with copiour left to right and right to right collaterals  Acute ischemic CVA - MRI with evidence of punctate right parietal lobe infarct. -  2-D echo with no embolic source, - CTA head and neck significant for ICA stenosis 65-70%,  - Lower extremity venous Doppler with no evidence of DVT - Recommendation is for dual antiplatelet therapy for 3 months, then continue Plavix alone ,Unfortunately patient has rash most likely related to Plavix , worsening after rechallenge him with Plavix . - LDL 89, Lipitor is on hold secondary to rash -  Glycohemoglobin A1c 5.8  Right ICA high-grade stenosis - Vascular surgery consulted, and for endarterectomy versus stenting, cardiology consulted for clearance - Status post cardiac cath 8/21 significant for CAD with chronic total occlusion of proximal RCA with copiour left to right and right to right collaterals, patient with stable CAD anatomy, okay to proceed with surgery from CAD standpoint by cardiology. - Patient with possible Plavix allergy, worsening rash overnight after rechallenge with Plavix, so Plavix was stopped and added to his allergy list - Discussed with vascular surgery, will start on Brilinta, will need 30 days postoperatively after stent, which he can use 30 days frequent, and for ICA stenting next Tuesday, patient instructed to start Brilinta 2 days before that. - We'll discharge on full dose aspirin, to take aspirin 81 mg while he is on Brilinta.  Hyperlipidemia - LDL 89, continue to hold statin as possibly contributing to his rash, Lipitor at it to his allergy list  CAD - Continue with aspirin - Patient with high risk stress test on 8/19,  cardiac cath 8/21, significant for CAD total occlusion of proximal RCA with copiour left to right and right to right collaterals   Erythematous rash -  appears to be having a drug rash, involving his back and abdomen, extending to the periphery, started recently on Plavix and Lipitor will continue to hold Lipitor, worsening rash overnight after his human Plavix, so Plavix was stopped, rash most likely due to Plavix allergy . - Received IV steroids and Benadryl for his rash.  Sinus tachycardia - Resolved with hydration  Leukocytosis - Afebrile, worsening leukocytosis overnight most likely related to IV steroids     Discharge Condition:  Stable - D/W son   Follow UP  Follow-up Information    Xu,Jindong, MD In 2 months.   Specialty:  Neurology Why:  Please call to schedule appt Contact information: [address] [place] [address] [phone]        Servando Snare, MD.   Specialties:  Vascular Surgery, Cardiology Why:  Plan for ICA stent next Tuesday Contact information: [place] [address] [phone]             Discharge Instructions  and  Discharge Medications    Discharge Instructions    Ambulatory referral to Neurology    Complete by:  As directed   Pt will follow up with Dr. Erlinda Hong at [facility] in about 2 months. Thanks.   Diet - low sodium heart healthy    Complete by:  As directed   Discharge instructions    Complete by:  As directed   Follow with Primary MD  in 7 days  Get CBC, CMP,  checked  by Primary MD next visit.    Activity: As tolerated with Full fall precautions use walker/cane & assistance as needed   Disposition Home    Diet: Heart Healthy , with feeding assistance and aspiration precautions.  For Heart failure patients - Check your Weight same time everyday, if you gain over 2 pounds, or you develop in leg swelling, experience more shortness of breath or chest pain, call your Primary MD immediately. Follow Cardiac Low Salt Diet and 1.5 lit/day fluid restriction.   On your next visit with your primary care physician please Get Medicines reviewed and adjusted.   Please request your Prim.MD to go over all Hospital Tests and Procedure/Radiological results at the follow up, please get all Hospital records sent to your Prim MD by signing hospital release before you go home.   If you experience worsening of your admission symptoms, develop shortness of breath, life threatening emergency, suicidal or homicidal thoughts you must seek medical attention immediately by calling 911 or calling your MD immediately  if symptoms less severe.  You Must read complete instructions/literature along with all the possible adverse reactions/side effects for all the Medicines you take and that have been prescribed to you. Take any new Medicines  after you have completely understood and accpet all the possible adverse reactions/side effects.   Do not drive, operating heavy machinery, perform activities at heights, swimming or participation in water activities or provide baby sitting services if your were admitted for syncope or siezures until you have seen by Primary MD or a Neurologist and advised to do so again.  Do not drive when taking Pain medications.    Do not take more than prescribed Pain, Sleep and Anxiety Medications  Special Instructions: If you have smoked or chewed Tobacco  in the last 2 yrs please stop smoking, stop any regular Alcohol  and or any Recreational drug use.  Wear Seat belts while driving.   Please note  You were cared for by a hospitalist during your hospital stay. If you have any questions about your discharge medications or the care you received while you were in the hospital after you are discharged, you can call the unit and asked to speak with the hospitalist on call if the hospitalist that took care of you is not available. Once you are discharged, your primary care physician will handle any further medical issues. Please note that NO REFILLS for any discharge medications will be authorized once you are discharged, as it is imperative that you return to your primary care physician (or establish a relationship with a primary care physician if you do not have one) for your aftercare needs so that they can reassess your need for medications and monitor your lab values.   Increase activity slowly    Complete by:  As directed       Medication List    TAKE these medications   aspirin EC 325 MG tablet Take 1 tablet (325 mg total) by mouth daily. What changed:  when to take this  Another medication with the same name was removed. Continue taking this medication, and follow the directions you see here.   ticagrelor 90 MG Tabs tablet Commonly known as:  BRILINTA Take 1 tablet (90 mg total) by mouth 2  (two) times daily. Start taking on:  02/10/2016         Diet and Activity recommendation: See Discharge Instructions above   Consults obtained -  Neurology Cardiology Vascular  surgery   Major procedures and Radiology Reports - PLEASE review detailed and final reports for all details, in brief -   cardiac cath 8/21, significant for CAD total occlusion of proximal RCA with copiour left to right and right to right collaterals    Ct Angio Head W Or Wo Contrast  Result Date: 01/31/2016 CLINICAL DATA:  Code stroke, LEFT arm weakness now resolved. History of atrial fibrillation. EXAM: CT ANGIOGRAPHY HEAD AND NECK TECHNIQUE: Multidetector CT imaging of the head and neck was performed using the standard protocol during bolus administration of intravenous contrast. Multiplanar CT image reconstructions and MIPs were obtained to evaluate the vascular anatomy. Carotid stenosis measurements (when applicable) are obtained utilizing NASCET criteria, using the distal internal carotid diameter as the denominator. CONTRAST:  50 cc Isovue 370 COMPARISON:  MRI of the brain January 30, 2016 FINDINGS: CTA NECK AORTIC ARCH: 4 cm ascending aorta with mild calcific atherosclerosis. The origins of the innominate, left Common carotid artery and subclavian artery are widely patent. Common origin of the innominate and LEFT subclavian artery. RIGHT CAROTID SYSTEM: Common carotid artery is widely patent, coursing in a straight line fashion. Eccentric intimal thickening and to lesser extent calcific atherosclerosis results in 9 mm segment 65-70% stenosis RIGHT internal carotid artery within 1 cm of the origin. LEFT CAROTID SYSTEM: Common carotid artery is widely patent, coursing in a straight line fashion. Normal appearance of the carotid bifurcation without hemodynamically significant stenosis by NASCET criteria. Eccentric intimal thickening results in less than 50% stenosis LEFT internal carotid artery origin. VERTEBRAL  ARTERIES:Codominant vertebral artery's. Normal appearance of the vertebral arteries, which appear widely patent. Mild extrinsic deformity due to degenerative cervical spine. SKELETON: No acute osseous process though bone windows have not been submitted. Patient is edentulous. Grade 1 C2-3 anterolisthesis associated with severe RIGHT facet arthropathy. Severe C4-5 through C6-7 degenerative discs. Severe RIGHT C2-3, moderate to severe bilateral C3-4, severe LEFT C5-6 neural foraminal narrowing. Moderate to severe C5-6 canal stenosis. Broad levoscoliosis. OTHER NECK: Soft tissues of the neck are non-acute though, not tailored for evaluation. Included view of the lung apices demonstrates severe centrilobular emphysema in LEFT apical bullous changes. No superior mediastinal lymphadenopathy. Mild medial deviation of RIGHT true vocal cord, RIGHTcricoarytenoid cartilage can be seen with vocal cord paralysis. CTA HEAD ANTERIOR CIRCULATION: Bilateral internal carotid arteries are patent. Intimal thickening results in focal at least 50% stenosis RIGHT anterior genu of the internal carotid artery. Mild calcific atherosclerosis of the carotid siphons. Developmentally small RIGHT A1 segment. Normal appearance the bilateral anterior cerebral arteries. Patent anterior communicating artery. Normal appearance the bilateral middle cerebral arteries. No large vessel occlusion, hemodynamically significant stenosis, dissection, luminal irregularity, contrast extravasation or aneurysm. POSTERIOR CIRCULATION: Normal appearance of the vertebral arteries, vertebrobasilar junction and basilar artery, as well as main branch vessels. Moderate stenosis RIGHT P1 origin. Otherwise appearance of the posterior cerebral arteries. Robust bilateral posterior communicating arteries present. No large vessel occlusion, dissection, luminal irregularity, contrast extravasation or aneurysm. VENOUS SINUSES: Major dural venous sinuses are patent though not  tailored for evaluation on this angiographic examination. ANATOMIC VARIANTS: None. DELAYED PHASE: No abnormal intracranial enhancement. IMPRESSION: CTA NECK: 65-70% stenosis RIGHT internal carotid artery origin. Less than 50% stenosis LEFT internal carotid artery origin. 4 cm ascending aorta. Recommend annual imaging followup by CTA or MRA. This recommendation follows 2010 ACCF/AHA/AATS/ACR/ASA/SCA/SCAI/SIR/STS/SVM Guidelines for the Diagnosis and Management of Patients with Thoracic Aortic Disease. Circulation. 2010; 121: [id number] Severe RIGHT C2-3 and severe LEFT C5-6 neural foraminal narrowing.  Moderate to severe C5-6 canal stenosis. CTA HEAD: No emergent large vessel occlusion or high-grade stenosis. Approximately 50% stenosis RIGHT anterior genu of the internal carotid artery. Moderate stenosis RIGHT P1 origin with complete circle of Willis. Electronically Signed   By: Elon Alas M.D.   On: 01/31/2016 14:13   Dg Chest 2 View  Result Date: 01/31/2016 CLINICAL DATA:  Acute onset of left-sided weakness. Initial encounter. EXAM: CHEST  2 VIEW COMPARISON:  Chest radiograph performed 11/01/2007 FINDINGS: The lungs are well-aerated. Mild left basilar atelectasis is noted. Mild peribronchial thickening is seen. There is no evidence of pleural effusion or pneumothorax. Emphysematous change is noted at the lung apices. The heart is borderline normal in size. No acute osseous abnormalities are seen. IMPRESSION: Mild left basilar atelectasis noted. Mild peribronchial thickening seen. Emphysematous change noted at the lung apices. Electronically Signed   By: Garald Balding M.D.   On: 01/31/2016 00:46   Ct Head Wo Contrast  Result Date: 01/30/2016 CLINICAL DATA:  Left arm numbness and nausea up earlier today which has result. EXAM: CT HEAD WITHOUT CONTRAST TECHNIQUE: Contiguous axial images were obtained from the base of the skull through the vertex without intravenous contrast. COMPARISON:  None. FINDINGS:  Brain: Generalized atrophy. The brainstem is normal. There may be an old small vessel left cerebellar infarction. Cerebral hemispheres show an old infarction in the right parietal cortical and subcortical brain. No sign of acute infarction, intra-axial mass lesion, hemorrhage, hydrocephalus or extra-axial collection. 7 mm left frontal dural calcification could represent a small calcified meningioma, not significant. Vascular: There is atherosclerotic calcification of the major vessels at the base of the brain. Skull: Normal Sinuses/Orbits: Clear Other: None IMPRESSION: No acute finding. Generalized atrophy. 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 Electronically Signed   By: Nelson Chimes M.D.   On: 01/30/2016 16:54   Ct Angio Neck W Or Wo Contrast  Result Date: 01/31/2016 CLINICAL DATA:  Code stroke, LEFT arm weakness now resolved. History of atrial fibrillation. EXAM: CT ANGIOGRAPHY HEAD AND NECK TECHNIQUE: Multidetector CT imaging of the head and neck was performed using the standard protocol during bolus administration of intravenous contrast. Multiplanar CT image reconstructions and MIPs were obtained to evaluate the vascular anatomy. Carotid stenosis measurements (when applicable) are obtained utilizing NASCET criteria, using the distal internal carotid diameter as the denominator. CONTRAST:  50 cc Isovue 370 COMPARISON:  MRI of the brain January 30, 2016 FINDINGS: CTA NECK AORTIC ARCH: 4 cm ascending aorta with mild calcific atherosclerosis. The origins of the innominate, left Common carotid artery and subclavian artery are widely patent. Common origin of the innominate and LEFT subclavian artery. RIGHT CAROTID SYSTEM: Common carotid artery is widely patent, coursing in a straight line fashion. Eccentric intimal thickening and to lesser extent calcific atherosclerosis results in 9 mm segment 65-70% stenosis RIGHT internal carotid artery within 1 cm of the origin. LEFT CAROTID SYSTEM: Common  carotid artery is widely patent, coursing in a straight line fashion. Normal appearance of the carotid bifurcation without hemodynamically significant stenosis by NASCET criteria. Eccentric intimal thickening results in less than 50% stenosis LEFT internal carotid artery origin. VERTEBRAL ARTERIES:Codominant vertebral artery's. Normal appearance of the vertebral arteries, which appear widely patent. Mild extrinsic deformity due to degenerative cervical spine. SKELETON: No acute osseous process though bone windows have not been submitted. Patient is edentulous. Grade 1 C2-3 anterolisthesis associated with severe RIGHT facet arthropathy. Severe C4-5 through C6-7 degenerative discs. Severe RIGHT C2-3, moderate to severe bilateral C3-4,  severe LEFT C5-6 neural foraminal narrowing. Moderate to severe C5-6 canal stenosis. Broad levoscoliosis. OTHER NECK: Soft tissues of the neck are non-acute though, not tailored for evaluation. Included view of the lung apices demonstrates severe centrilobular emphysema in LEFT apical bullous changes. No superior mediastinal lymphadenopathy. Mild medial deviation of RIGHT true vocal cord, RIGHTcricoarytenoid cartilage can be seen with vocal cord paralysis. CTA HEAD ANTERIOR CIRCULATION: Bilateral internal carotid arteries are patent. Intimal thickening results in focal at least 50% stenosis RIGHT anterior genu of the internal carotid artery. Mild calcific atherosclerosis of the carotid siphons. Developmentally small RIGHT A1 segment. Normal appearance the bilateral anterior cerebral arteries. Patent anterior communicating artery. Normal appearance the bilateral middle cerebral arteries. No large vessel occlusion, hemodynamically significant stenosis, dissection, luminal irregularity, contrast extravasation or aneurysm. POSTERIOR CIRCULATION: Normal appearance of the vertebral arteries, vertebrobasilar junction and basilar artery, as well as main branch vessels. Moderate stenosis RIGHT P1  origin. Otherwise appearance of the posterior cerebral arteries. Robust bilateral posterior communicating arteries present. No large vessel occlusion, dissection, luminal irregularity, contrast extravasation or aneurysm. VENOUS SINUSES: Major dural venous sinuses are patent though not tailored for evaluation on this angiographic examination. ANATOMIC VARIANTS: None. DELAYED PHASE: No abnormal intracranial enhancement. IMPRESSION: CTA NECK: 65-70% stenosis RIGHT internal carotid artery origin. Less than 50% stenosis LEFT internal carotid artery origin. 4 cm ascending aorta. Recommend annual imaging followup by CTA or MRA. This recommendation follows 2010 ACCF/AHA/AATS/ACR/ASA/SCA/SCAI/SIR/STS/SVM Guidelines for the Diagnosis and Management of Patients with Thoracic Aortic Disease. Circulation. 2010; 121: [id number] Severe RIGHT C2-3 and severe LEFT C5-6 neural foraminal narrowing. Moderate to severe C5-6 canal stenosis. CTA HEAD: No emergent large vessel occlusion or high-grade stenosis. Approximately 50% stenosis RIGHT anterior genu of the internal carotid artery. Moderate stenosis RIGHT P1 origin with complete circle of Willis. Electronically Signed   By: Elon Alas M.D.   On: 01/31/2016 14:13   Mr Brain Wo Contrast  Result Date: 01/30/2016 CLINICAL DATA:  82 y/o M; transient weakness and loss of sensation within the left arm. EXAM: MRI HEAD WITHOUT CONTRAST TECHNIQUE: Multiplanar, multiecho pulse sequences of the brain and surrounding structures were obtained without intravenous contrast. COMPARISON:  Head CT dated 01/30/2016. FINDINGS: Brain: There is diffusion hyperintensity without low ADC in region of gliosis from prior infarct in the right parietal lobe consistent with T2 shine through. A single punctate focus in the right parietal lobe may demonstrate low ADC an represent an acute area of infarction (series 4, image 34). Mild hemosiderin staining of the right parietal infarct. No additional focus  of susceptibility hypointensity to indicate intracranial hemorrhage. Background of mild parenchymal volume loss and chronic microvascular ischemic changes. Small chronic infarct in the left cerebellar hemisphere. Extra-axial space: Normal ventricular size. No midline shift. No effacement of basilar cisterns. No extra-axial collection is identified. Proximal intracranial flow voids are maintained. No abnormality of the cervical medullary junction. Other: No abnormal signal of the paranasal sinuses. Partial opacification of the right mastoid air cells. No abnormal signal of left mastoid air cells. Orbits are unremarkable. Calvarium is unremarkable. IMPRESSION: 1. Punctate focus of acute infarction in the right parietal lobe adjacent region of prior chronic infarction. 2. Background of mild chronic microvascular ischemic disease, parenchymal volume loss, and chronic infarcts in the right parietal lobe and left cerebellar hemisphere. These results were called by telephone at the time of interpretation on 01/30/2016 at 11:53 pm to Dr. Leonette Monarch, who verbally acknowledged these results. Electronically Signed   By: Edgardo Roys.D.  On: 01/30/2016 23:54   Nm Myocar Multi W/spect W/wall Motion / Ef  Result Date: 02/02/2016 CLINICAL DATA:  Chest pain. Prior myocardial infarctions. Preop. Shortness of breath. EXAM: MYOCARDIAL IMAGING WITH SPECT (REST AND PHARMACOLOGIC-STRESS) GATED LEFT VENTRICULAR WALL MOTION STUDY LEFT VENTRICULAR EJECTION FRACTION TECHNIQUE: Standard myocardial SPECT imaging was performed after resting intravenous injection of 10 mCi Tc-74m tetrofosmin. Subsequently, intravenous infusion of Lexiscan was performed under the supervision of the Cardiology staff. At peak effect of the drug, 30 mCi Tc-70m tetrofosmin was injected intravenously and standard myocardial SPECT imaging was performed. Quantitative gated imaging was also performed to evaluate left ventricular wall motion, and estimate  left ventricular ejection fraction. COMPARISON:  Chest radiograph 01/31/2016. FINDINGS: Perfusion: Large fixed defect/ scar involving the inferior and lateral walls. No areas of reversibility to suggest inducible ischemia. Wall Motion: Diffuse hypokinesis. This is more severe involving the lateral and inferior walls. Decreased thickening in these areas. Left Ventricular Ejection Fraction: 23 % End diastolic volume 54 ml End systolic volume 41  ml IMPRESSION: 1. No reversible ischemia. Large infarct involving the inferior and lateral walls. 2. Global hypokinesis with more severe hypokinesis involving the inferior and lateral walls. 3. Left ventricular ejection fraction 23% 4. Non invasive risk stratification*: High *2012 Appropriate Use Criteria for Coronary Revascularization Focused Update: J Am [facility]. [id number]. [email or web address] Electronically Signed   By: Abigail Miyamoto M.D.   On: 02/02/2016 15:34    Micro Results   No results found for this or any previous visit (from the past 240 hour(s)).     Today   Subjective:   David Bradshaw today has no headache,no chest or abdominal pain,no new weakness tingling or numbness, feels much better wants to go home today.   Objective:   Blood pressure 97/75, pulse 99, temperature 97.5 F (36.4 C), temperature source Oral, resp. rate 20, height 5' (1.524 m), weight 74.8 kg (164 lb 14.4 oz), SpO2 94 %.   Intake/Output Summary (Last 24 hours) at 02/06/16 1309 Last data filed at 02/06/16 0730  Gross per 24 hour  Intake              760 ml  Output                0 ml  Net              760 ml    Exam Awake Alert, Oriented X 3, No new F.N deficits, Normal affect Vincent.AT,PERRAL Supple Neck,No JVD, No cervical lymphadenopathy appriciated.  Symmetrical Chest wall movement, Good air movement bilaterally, CTAB RRR,No Gallops,Rubs or new Murmurs, No Parasternal Heave +ve B.Sounds, Abd Soft, No  tenderness, No organomegaly appriciated, No rebound - guarding or rigidity. No Cyanosis, Clubbing or edema,  worsening erythematous rash today, with extending to distal extremities.  Data Review   CBC w Diff: Lab Results  Component Value Date   WBC 19.4 (H) 02/06/2016   HGB 15.7 02/06/2016   HCT 46.8 02/06/2016   PLT 178 02/06/2016   LYMPHOPCT 32 01/30/2016   MONOPCT 8 01/30/2016   EOSPCT 1 01/30/2016   BASOPCT 1 01/30/2016    CMP: Lab Results  Component Value Date   NA 137 02/06/2016   K 4.7 02/06/2016   CL 110 02/06/2016   CO2 22 02/06/2016   BUN 19 02/06/2016   CREATININE 1.39 (H) 02/06/2016   PROT 6.8 01/30/2016   ALBUMIN 3.6 01/30/2016   BILITOT 1.0 01/30/2016   ALKPHOS 87 01/30/2016   AST 43 (H)  01/30/2016   ALT 30 01/30/2016  .   Total Time in preparing paper work, data evaluation and todays exam - 35 minutes  Damonte Frieson M.D on 02/06/2016 at 1:09 PM  [facility]   Office  [phone]

## 2016-02-06 NOTE — Progress Notes (Signed)
Per Clinical biochemist for [employer] 02-04-16 S/W [facility] @ [employer] [facility] RX # [phone]   BRILINTA 90 MG BID   COVER- YES  CO-PAY- $ 242.00   TIER- 3 DRUG  PRIOR APPROVAL - NO  [facility]ACY : [facility]   Patient can use 30 day free card x 1 , patient unable to use co pay card because he has Medicare .

## 2016-02-06 NOTE — Progress Notes (Signed)
Pt discharging home with son.  All instructions and prescriptions given and reviewed, all questions answered.  Please call Pt's son Ahmad Magann at [phone] in order with instructions regarding procedure planned for Tue 8/29.

## 2016-02-06 NOTE — Discharge Instructions (Signed)
Follow with Primary MDin 7 days  ? ?Get CBC, CMP,  checked  by Primary MD next visit.  ? ? ?Activity: As tolerated with Full fall precautions use walker/cane & assistance as needed ? ?Disposition Home ? ? ?Diet: Heart Healthy  , with feeding assistance and aspiration precautions. ? ?For Heart failure patients - Check your Weight same time everyday, if you gain over 2 pounds, or you develop in leg swelling, experience more shortness of breath or chest pain, call your Primary MD immediately. Follow Cardiac Low Salt Diet and 1.5 lit/day fluid restriction. ? ? ?On your next visit with your primary care physician please Get Medicines reviewed and adjusted. ? ? ?Please request your Prim.MD to go over all Hospital Tests and Procedure/Radiological results at the follow up, please get all Hospital records sent to your Prim MD by signing hospital release before you go home. ? ? ?If you experience worsening of your admission symptoms, develop shortness of breath, life threatening emergency, suicidal or homicidal thoughts you must seek medical attention immediately by calling 911 or calling your MD immediately  if symptoms less severe. ? ?You Must read complete instructions/literature along with all the possible adverse reactions/side effects for all the Medicines you take and that have been prescribed to you. Take any new Medicines after you have completely understood and accpet all the possible adverse reactions/side effects.  ? ?Do not drive, operating heavy machinery, perform activities at heights, swimming or participation in water activities or provide baby sitting services if your were admitted for syncope or siezures until you have seen by Primary MD or a Neurologist and advised to do so again. ? ?Do not drive when taking Pain medications.  ? ? ?Do not take more than prescribed Pain, Sleep and Anxiety Medications ? ?Special Instructions: If you have smoked or chewed Tobacco  in the last 2 yrs please stop smoking, stop  any regular Alcohol  and or any Recreational drug use. ? ?Wear Seat belts while driving. ? ? ?Please note ? ?You were cared for by a hospitalist during your hospital stay. If you have any questions about your discharge medications or the care you received while you were in the hospital after you are discharged, you can call the unit and asked to speak with the hospitalist on call if the hospitalist that took care of you is not available. Once you are discharged, your primary care physician will handle any further medical issues. Please note that NO REFILLS for any discharge medications will be authorized once you are discharged, as it is imperative that you return to your primary care physician (or establish a relationship with a primary care physician if you do not have one) for your aftercare needs so that they can reassess your need for medications and monitor your lab values.  ? ?

## 2016-02-07 ENCOUNTER — Telehealth: Payer: Self-pay

## 2016-02-07 NOTE — Telephone Encounter (Signed)
Discussed pt's rash with Dr. Donzetta Matters.  Per Dr. Donzetta Matters, the pt. should not be starting Brilinta until Sunday, 8/27.   Advised to schedule office appt. tomorrow, 8/25, to discuss plan for proceeding with surgery for carotid stenosis.  Also advised to have pt. to hold Brilinta at this time.  Phone call to pt's son.  Advised that the pt. Should hold Brilinta and come to office tomorrow to further discuss treatment.  Son stated he understood not to start the Brilinta until Sunday, but reported that the pt. rec'd 2 doses while in the hospital, on Tues., and Wed.  Appt. Given for 8/25 @ 2:15 PM.  Son verb. Understanding, and agreed with plan.

## 2016-02-07 NOTE — Telephone Encounter (Signed)
Rec'd phone call from pt's. son.  Reported pt. was changed from Plavix to [place], recently, after reacting with a rash, while taking Plavix.  Reported while in hospital, the rash was present on his back and abdomen, and now has spread to his arm and legs.  Voiced concern that the pt. Is reacting to the [place], and questioned if he will be able to continue to take this.  Is scheduled for right carotid stent on 8/29.  Advised will notify Dr. Donzetta Matters re: progression of rash, and will call the son back with recommendations. Agreed.

## 2016-02-08 ENCOUNTER — Other Ambulatory Visit: Payer: Self-pay

## 2016-02-08 ENCOUNTER — Encounter: Payer: Self-pay | Admitting: Vascular Surgery

## 2016-02-08 ENCOUNTER — Ambulatory Visit (INDEPENDENT_AMBULATORY_CARE_PROVIDER_SITE_OTHER): Payer: Medicare HMO | Admitting: Vascular Surgery

## 2016-02-08 VITALS — BP 122/88 | HR 121 | Temp 97.5°F | Resp 18 | Ht 60.0 in | Wt 167.0 lb

## 2016-02-08 DIAGNOSIS — I6521 Occlusion and stenosis of right carotid artery: Secondary | ICD-10-CM

## 2016-02-08 NOTE — Progress Notes (Signed)
Vitals:   02/08/16 1407  BP: (!) 140/99  Pulse: (!) 121  Resp: 18  Temp: 97.5 F (36.4 C)  SpO2: 94%  Weight: 167 lb (75.8 kg)  Height: 5' (1.524 m)

## 2016-02-08 NOTE — Progress Notes (Signed)
Patient ID: David Bradshaw, male   DOB: 05-Oct-1926, 82 y.o.   MRN: [id number]  Reason for Consult: New Evaluation (carotid)   Referred by Rosalin Hawking, MD  Subjective:     HPI:  David Bradshaw is an 82 y.o. male recently admitted with right parietal stroke symptoms have resolved. He was initially started on Plavix but could not tolerate secondary to rash. And then was switched to Brilinta and is still having bad rash along with edema of his bilateral upper and bilateral lower extremities. We have him scheduled for this Tuesday for right carotid artery stent however given rash and intolerance of P2 Y 12 platelet inhibitors I think stenting is probably a bad idea. He is in agreement that we should proceed with carotid endarterectomy when rash has resolved. He has no new stroke symptoms and has not had chest pain or worsening shortness of breath. Cardiology cleared him for surgery following cardiac cath as an inpatient.  Past Medical History:  Diagnosis Date  . Coronary arteriosclerosis due to lipid rich plaque 01/30/2016  . Myocardial infarct ([place])    1988 and 1990   Family History  Problem Relation Age of Onset  . Heart attack Mother   . Heart disease Mother   . Bone cancer Father   . Cancer Father    Past Surgical History:  Procedure Laterality Date  . CARDIAC CATHETERIZATION N/A 02/04/2016   Procedure: Left Heart Cath and Coronary Angiography;  Surgeon: Jolaine Artist, MD;  Location: [facility];  Service: Cardiovascular;  Laterality: N/A;    Short Social History:  Social History  Substance Use Topics  . Smoking status: Former Research scientist (life sciences)  . Smokeless tobacco: Never Used  . Alcohol use No    Allergies  Allergen Reactions  . Atorvastatin Rash    Patient started on plavix and lipitor at the same time and developed rash  . Plavix [Clopidogrel Bisulfate] Rash    Patient started on plavix and lipitor at the same time and developed rash    Current Outpatient  Prescriptions  Medication Sig Dispense Refill  . aspirin EC 325 MG tablet Take 1 tablet (325 mg total) by mouth daily. 30 tablet 0  . [START ON 02/10/2016] ticagrelor (BRILINTA) 90 MG TABS tablet Take 1 tablet (90 mg total) by mouth 2 (two) times daily. (Patient not taking: Reported on 02/08/2016) 60 tablet 0   No current facility-administered medications for this visit.     Review of Systems  Constitutional: Negative for chills and fever.  Eyes: Negative for loss of vision.  Cardiovascular: Positive for irregular heartbeat. Negative for chest pain, claudication, orthopnea and palpitations.  GI: Negative for abdominal pain.  Skin: Positive for rash.  Neurological: Negative for dizziness, facial asymmetry, focal weakness, headaches and light-headedness.        Objective:  Objective   Vitals:   02/08/16 1407 02/08/16 1408  BP: (!) 140/99 122/88  Pulse: (!) 121 (!) 121  Resp: 18   Temp: 97.5 F (36.4 C)   SpO2: 94%   Weight: 167 lb (75.8 kg)   Height: 5' (1.524 m)    Body mass index is 32.61 kg/m.  Physical Exam  Constitutional: He is oriented to person, place, and time. He appears well-developed.  Neck: Normal range of motion.  Cardiovascular: Normal rate and regular rhythm.   Pulmonary/Chest: Effort normal.  Abdominal: Soft.  Musculoskeletal: He exhibits edema.  Neurological: He is alert and oriented to person, place, and time.  Skin:  Rash noted.  Redness to torso, bilateral upper and lower extremities    Data: IMPRESSION: CTA NECK: 65-70% stenosis RIGHT internal carotid artery origin. Less than 50% stenosis LEFT internal carotid artery origin.  4 cm ascending aorta. Recommend annual imaging followup by CTA or MRA. This recommendation follows 2010 ACCF/AHA/AATS/ACR/ASA/SCA/SCAI/SIR/STS/SVM Guidelines for the Diagnosis and Management of Patients with Thoracic Aortic Disease. Circulation. 2010; 121: [id number]  Severe RIGHT C2-3 and severe LEFT C5-6 neural  foraminal narrowing. Moderate to severe C5-6 canal stenosis.  CTA HEAD: No emergent large vessel occlusion or high-grade stenosis.  Approximately 50% stenosis RIGHT anterior genu of the internal carotid artery. Moderate stenosis RIGHT P1 origin with complete circle of Willis.     Assessment/Plan:   82 year old white male with history of symptomatic right carotid stenosis at 70% by CTA. He was scheduled for carotid stent but cannot tolerate Plavix or Brilinta and has been on long-term aspirin therapy. He needs to see a physician about his rash as well as lower extremity edema. We will cancel his carotid stent for next Tuesday and plan to do endarterectomy later in the week. We discussed the risks of bleeding infection nerve injury 5% stroke 1-2% and the risk of general anesthesia specifically to his heart given his coronary artery disease that is known. He agrees that he will see a physician regarding the rash beginning of next week with plans for operation on Thursday should all go well.     [name] MD [facility]

## 2016-02-11 ENCOUNTER — Encounter: Payer: Self-pay | Admitting: Family

## 2016-02-11 ENCOUNTER — Other Ambulatory Visit (INDEPENDENT_AMBULATORY_CARE_PROVIDER_SITE_OTHER): Payer: Medicare HMO

## 2016-02-11 ENCOUNTER — Ambulatory Visit (INDEPENDENT_AMBULATORY_CARE_PROVIDER_SITE_OTHER): Payer: Medicare HMO | Admitting: Family

## 2016-02-11 VITALS — BP 124/80 | HR 115 | Temp 98.0°F | Resp 18 | Ht 60.0 in | Wt 166.0 lb

## 2016-02-11 DIAGNOSIS — N183 Chronic kidney disease, stage 3 unspecified: Secondary | ICD-10-CM

## 2016-02-11 DIAGNOSIS — R600 Localized edema: Secondary | ICD-10-CM

## 2016-02-11 DIAGNOSIS — I6309 Cerebral infarction due to thrombosis of other precerebral artery: Secondary | ICD-10-CM | POA: Diagnosis not present

## 2016-02-11 LAB — CBC
HCT: 50.7 % (ref 39.0–52.0)
Hemoglobin: 17.4 g/dL — ABNORMAL HIGH (ref 13.0–17.0)
MCHC: 34.3 g/dL (ref 30.0–36.0)
MCV: 91.9 fl (ref 78.0–100.0)
Platelets: 229 10*3/uL (ref 150.0–400.0)
RBC: 5.52 Mil/uL (ref 4.22–5.81)
RDW: 14.6 % (ref 11.5–15.5)
WBC: 16.9 10*3/uL — ABNORMAL HIGH (ref 4.0–10.5)

## 2016-02-11 LAB — COMPREHENSIVE METABOLIC PANEL
ALT: 29 U/L (ref 0–53)
AST: 24 U/L (ref 0–37)
Albumin: 3.3 g/dL — ABNORMAL LOW (ref 3.5–5.2)
Alkaline Phosphatase: 124 U/L — ABNORMAL HIGH (ref 39–117)
BUN: 13 mg/dL (ref 6–23)
CO2: 25 mEq/L (ref 19–32)
Calcium: 8.3 mg/dL — ABNORMAL LOW (ref 8.4–10.5)
Chloride: 104 mEq/L (ref 96–112)
Creatinine, Ser: 1.16 mg/dL (ref 0.40–1.50)
GFR: 63.33 mL/min (ref >60.00)
Glucose, Bld: 158 mg/dL — ABNORMAL HIGH (ref 70–99)
Potassium: 4 mEq/L (ref 3.5–5.1)
Sodium: 138 mEq/L (ref 135–145)
Total Bilirubin: 0.5 mg/dL (ref 0.2–1.2)
Total Protein: 6.3 g/dL (ref 6.0–8.3)

## 2016-02-11 MED ORDER — FUROSEMIDE 20 MG PO TABS: 20.0000 mg | ORAL_TABLET | Freq: Every day | ORAL | 1 refills | Status: DC

## 2016-02-11 NOTE — Assessment & Plan Note (Signed)
Most recent GFR indicates chronic kidney disease Stage 3. Obtain CMET to check current GFR for improvement/worsening. Consider addition of low dose ACE/ARB for kidney protection. A1c shows pre-diabetes and blood pressure is well controlled.

## 2016-02-11 NOTE — Patient Instructions (Signed)
Thank you for choosing [facility].  SUMMARY AND INSTRUCTIONS:  Please keep your legs elevated, decrease the sodium intake in your diet, and compression socks as needed.  Follow up with neurology as scheduled.   We will speak with Cardiology regarding follow up.   Medication:  Please continue to take the aspirin and Benedryl.  Start taking the Lasix daily.  Your prescription(s) have been submitted to your pharmacy or been printed and provided for you. Please take as directed and contact our office if you believe you are having problem(s) with the medication(s) or have any questions.  Labs:  Please stop by the lab on the lower level of the building for your blood work. Your results will be released to [place] (or called to you) after review, usually within 72 hours after test completion. If any changes need to be made, you will be notified at that same time.  1.) The lab is open from 7:30am to 5:30 pm Monday-Friday 2.) No appointment is necessary 3.) Fasting (if needed) is 6-8 hours after food and drink; black coffee and water are okay    Follow up:  If your symptoms worsen or fail to improve, please contact our office for further instruction, or in case of emergency go directly to the emergency room at the closest medical facility.

## 2016-02-11 NOTE — Progress Notes (Signed)
Subjective:    Patient ID: David Bradshaw, male    DOB: 1927/05/09, 82 y.o.   MRN: [id number]  Chief Complaint  Patient presents with  . Establish Care    hospital follow up, supposed to have surgery on thurs, there was concerns about rash and the fluid in legs    HPI:  David Bradshaw is a 82 y.o. male who  has a past medical history of Chicken pox; Coronary arteriosclerosis due to lipid rich plaque (01/30/2016); Myocardial infarct [facility]); and Stroke ([place]). and presents today for an office visit to establish care.   1.) Transient cerebral ischemia/polycythemia/stroke - recently evaluated in the emergency department and admitted to the hospital following a sudden development of weakness and loss of sensation in left arm which lasted approximately 30 seconds. He did have significant cardiac history with no stents. Upon arrival to the emergency department his symptoms had resolved. Physical exam showed no significant findings or pronator drift. CT scan was negative for intracranial pathology. Initial troponins were negative with low suspicion for ACS. There is concern for a TIA. The workup was significant for acute ischemic CVA of the puncture right parietal lobe on MRI. 2-D echo with no embolic source. CT of the head and neck showed significant ICA stenosis of 65-75%. There are no DVTs noted on his lower extremity Dopplers. He was started on dual antiplatelet therapy 3 months. He was noted to have a rash at the start of multiple medications with concern for possible allergic reaction to Plavix. Vascular surgery was consult and for the right ICA high-grade stenosis he was cleared to proceed with surgery from a coronary artery disease standpoint. He was noted to have an erythematous rash involving his back and abdomen extending to the periphery since starting Plavix and Lipitor. He received IV steroids and Benadryl for the rash. He has follow-up scheduled with Dr. Donzetta Matters from vascular surgery regarding  an ICA stent. He was recommended follow-up for CBC and BMP office visit. He also has scheduled follow-up with neurology in 8 weeks. All hospital records, imaging, and labs were reviewed in detail.  Since leaving the hospital he reports that he has been doing well. Denies any further numbness/weakness of the left upper extremity. Continues to experience the associated symptom of a rash diffusely spread throughout his trunk and extremities. Modifying factors include Benedryl which seems to help with the itching. Course of the symptoms appears to be improving. Does have weight gain and fluid in his legs. Does have shortness of breath with activity which he reports has been baseline.   Allergies  Allergen Reactions  . Brilinta [Ticagrelor] Hives  . Atorvastatin Rash    Patient started on plavix and lipitor at the same time and developed rash  . Plavix [Clopidogrel Bisulfate] Rash    Patient started on plavix and lipitor at the same time and developed rash      Outpatient Medications Prior to Visit  Medication Sig Dispense Refill  . aspirin EC 325 MG tablet Take 1 tablet (325 mg total) by mouth daily. 30 tablet 0  . ticagrelor (BRILINTA) 90 MG TABS tablet Take 1 tablet (90 mg total) by mouth 2 (two) times daily. (Patient not taking: Reported on 02/08/2016) 60 tablet 0   No facility-administered medications prior to visit.       Past Surgical History:  Procedure Laterality Date  . CARDIAC CATHETERIZATION N/A 02/04/2016   Procedure: Left Heart Cath and Coronary Angiography;  Surgeon: Jolaine Artist, MD;  Location: [facility];  Service: Cardiovascular;  Laterality: N/A;      Past Medical History:  Diagnosis Date  . Chicken pox   . Coronary arteriosclerosis due to lipid rich plaque 01/30/2016  . Myocardial infarct ([place])    1988 and 1990  . Stroke [facility])       Review of Systems  Constitutional: Negative for chills and fever.  Respiratory: Positive for shortness of breath.  Negative for chest tightness.   Cardiovascular: Positive for leg swelling. Negative for chest pain and palpitations.  Skin: Positive for rash.  Neurological: Negative for dizziness, facial asymmetry, speech difficulty, weakness and numbness.      Objective:    BP 124/80 (BP Location: Left Arm, Patient Position: Sitting, Cuff Size: Normal)   Pulse (!) 115   Temp 98 F (36.7 C) (Oral)   Resp 18   Ht 5' (1.524 m)   Wt 166 lb (75.3 kg)   SpO2 92%   BMI 32.42 kg/m  Nursing note and vital signs reviewed.  Physical Exam  Constitutional: He is oriented to person, place, and time. He appears well-developed and well-nourished. No distress.  HENT:  Right Ear: Hearing, tympanic membrane, external ear and ear canal normal.  Left Ear: Hearing, tympanic membrane, external ear and ear canal normal.  Mouth/Throat: Uvula is midline, oropharynx is clear and moist and mucous membranes are normal.  Cardiovascular: Normal heart sounds and intact distal pulses.  An irregular rhythm present. Tachycardia present.   Mild/moderate pitting edema of bilateral lower extremity edema.   Pulmonary/Chest: Effort normal and breath sounds normal.  Neurological: He is alert and oriented to person, place, and time.  Skin: Skin is warm and dry.  Psychiatric: He has a normal mood and affect. His behavior is normal. Judgment and thought content normal.       Assessment & Plan:   Problem List Items Addressed This Visit      Cardiovascular and Mediastinum   Stroke ([place]) - Primary    Appears stable with no significant residual effects. No significant findings on neurological exam. Continue to work to decrease cardiovascular/stroke risk factors. Previously tried on statin with concern for possible allergic reaction. Blood pressure is well controlled and most recent A1c of 5.8 with pre-diabetes. Continue to monitor.       Relevant Medications   furosemide (LASIX) 20 MG tablet   Other Relevant Orders   Comprehensive  metabolic panel   CBC     Genitourinary   Chronic kidney disease, stage 3    Most recent GFR indicates chronic kidney disease Stage 3. Obtain CMET to check current GFR for improvement/worsening. Consider addition of low dose ACE/ARB for kidney protection. A1c shows pre-diabetes and blood pressure is well controlled.         Other   Bilateral lower extremity edema    Bilateral lower extremity edema with concern for cardiac origin based on previous cardiac cath and echocardiogram. Increased fluid and weight gain most likely from recent hospitalization. Not currently maintained on medications. Start low dose furosemide. Keep feet elevated, decrease sodium in diet, and compression socks as needed. Instructed to continue to monitor weights.        Other Visit Diagnoses   None.      I have discontinued Mr. Senner's ticagrelor. I am also having him start on furosemide. Additionally, I am having him maintain his aspirin EC.   Meds ordered this encounter  Medications  . furosemide (LASIX) 20 MG tablet  Sig: Take 1 tablet (20 mg total) by mouth daily.    Dispense:  30 tablet    Refill:  1    Order Specific Question:   Supervising Provider    Answer:   Pricilla Holm A [id number]     Follow-up: Return in about 1 month (around 03/13/2016), or if symptoms worsen or fail to improve.  Mauricio Po, FNP

## 2016-02-11 NOTE — Assessment & Plan Note (Signed)
Appears stable with no significant residual effects. No significant findings on neurological exam. Continue to work to decrease cardiovascular/stroke risk factors. Previously tried on statin with concern for possible allergic reaction. Blood pressure is well controlled and most recent A1c of 5.8 with pre-diabetes. Continue to monitor.

## 2016-02-11 NOTE — Assessment & Plan Note (Signed)
Bilateral lower extremity edema with concern for cardiac origin based on previous cardiac cath and echocardiogram. Increased fluid and weight gain most likely from recent hospitalization. Not currently maintained on medications. Start low dose furosemide. Keep feet elevated, decrease sodium in diet, and compression socks as needed. Instructed to continue to monitor weights.

## 2016-02-12 ENCOUNTER — Encounter (HOSPITAL_COMMUNITY): Admission: RE | Payer: Self-pay | Source: Ambulatory Visit

## 2016-02-12 ENCOUNTER — Ambulatory Visit (HOSPITAL_COMMUNITY): Admission: RE | Admit: 2016-02-12 | Payer: Medicare HMO | Source: Ambulatory Visit | Admitting: Surgery

## 2016-02-12 SURGERY — CAROTID PTA/STENT INTERVENTION

## 2016-02-13 ENCOUNTER — Encounter (HOSPITAL_COMMUNITY): Payer: Self-pay | Admitting: *Deleted

## 2016-02-13 NOTE — Progress Notes (Signed)
Pt SDW-Pre-op call completed by pt son, [name] [facility]). Son denies that pt C/O SOB and chest pain. Son denies that pt is under the care of a cardiologist. Son made aware to have pt stop taking vitamins, fish oil and herbal medications. Do not take any NSAIDs ie: Ibuprofen, Advil, Naproxen, BC and Goody Powder. Pt chart forwarded to anesthesia ( see note).

## 2016-02-13 NOTE — Progress Notes (Signed)
Anesthesia Chart Review: SAME DAY WORK-UP.  Patient is a 82 year old male scheduled for right CEA on 02/14/16 by Dr. Donzetta Matters. He has symptomatic right ICA stenosis.  History includes former smoker (quit '87), CAD, MI s/p PTCA 1988/1990 (Dr. Olevia Perches), carotid artery stenosis, CVA (acute right parietal lobe 01/30/16 with chronic infarct right parietal and left cerebellar regions). There was mild AS by 01/31/16 echo and a 4 cm ascending TAA by 01/31/16 CTA . He was initially treated with ASA and Plavix for his CVA, but Plavix was discontinued following a rash. He was treated with IV steroids and Benadryl. He was switched to Brilinta in anticipation for carotid stent, but still with bad rash so felt to have intolerance of p2y12 inhibitors so carotid endarterectomy favored over carotid stenting.   - PCP is Mauricio Po, FNP, last visit 01/11/16. - Neurologist is Dr. Erlinda Hong. - Patient seen by [facility] as a new patient during his 01/2016 CVA hospitalization. Consult done by Dr. Dorris Carnes, with surgical clearance by Dr. Ardyth Man. Stanford Breed following LHC.  Meds include ASA, Lasix.  01/30/16 EKG: ST at 106, first degree AV block, PACs with abberant conduction, inferior infarct (age undetermined).  02/04/16 Cardiac cath (Dr. Haroldine Laws):  Prox RCA lesion, 100 %stenosed.  1st Mrg lesion, 30 %stenosed.  Mid LAD lesion, 20 %stenosed.  The left ventricular ejection fraction is 35-45% by visual estimate.  Assessment and Plan: 1. CAD with chronic total occlusion of proximal RCA with copiour left to right and right to right collaterals 2. Minimal CAD in LAD and LCX 3. LVEF 35-40% with inferior AK 4. Stable CAD anatomy - ok to proceed with surgery from CAD standpoint 5. Continue aggressive RF management  02/02/16 Nuclear stress test: IMPRESSION: 1. No reversible ischemia. Large infarct involving the inferior and lateral walls. 2. Global hypokinesis with more severe hypokinesis involving the inferior and  lateral walls. 3. Left ventricular ejection fraction 23% 4. Non invasive risk stratification*: High (Patient referred for LHC. Done 02/04/16, see above.)  01/31/16 Echo: Study Conclusions - Left ventricle: The cavity size was normal. There was moderate   concentric hypertrophy. Systolic function was normal. Wall motion   was normal; there were no regional wall motion abnormalities.   Doppler parameters are consistent with a reversible restrictive   pattern, indicative of decreased left ventricular diastolic   compliance and/or increased left atrial pressure (grade 3   diastolic dysfunction). - Aortic valve: Transvalvular velocity was increased. There was   mild stenosis. Mean gradient (S): 19 mm Hg. Valve area (VTI):   0.97 cm^2. Valve area (Vmax): 0.98 cm^2. Valve area (Vmean): 0.74   cm^2. - Mitral valve: Transvalvular velocity was within the normal range.   There was no evidence for stenosis. There was no regurgitation. - Right ventricle: The cavity size was normal. Wall thickness was   normal. Systolic function was normal. - Tricuspid valve: There was no regurgitation.  01/31/16 CTA Head/Neck: IMPRESSION: CTA NECK: 65-70% stenosis RIGHT internal carotid artery origin. Less than 50% stenosis LEFT internal carotid artery origin. - 4 cm ascending aorta. Recommend annual imaging followup by CTA or MRA. This recommendation follows 2010 ACCF/AHA/AATS/ACR/ASA/SCA/SCAI/SIR/STS/SVM Guidelines for the Diagnosis and Management of Patients with Thoracic Aortic Disease. Circulation. 2010; 121: [id number] - Severe RIGHT C2-3 and severe LEFT C5-6 neural foraminal narrowing. Moderate to severe C5-6 canal stenosis. CTA HEAD: No emergent large vessel occlusion or high-grade stenosis. - Approximately 50% stenosis RIGHT anterior genu of the internal carotid artery. Moderate stenosis RIGHT P1 origin with  complete circle of Willis.  01/31/16 CXR: IMPRESSION: Mild left basilar atelectasis noted. Mild  peribronchial thickening seen. Emphysematous change noted at the lung apices.  Labs from 02/11/16 noted. Cr 1.16. Glucose 158. WBC 16.9 (down from 19.4)--steroids for rash could be contributing to leukocytosis (was admitted 01/30/16 with WBC 10.6 and H/H 18.3/53.7). He is getting additional labs as ordered by [facility] on arrival tomorrow. I'll add a CBC with diff as well to re-evaluate.   He is a same day work-up, so further evaluation by his surgeon and anesthesiologist on the day of surgery to ensure labs are acceptable and patient stable to proceed.  George Hugh [facility]/Anesthesiology Phone [phone] 02/13/2016 12:49 PM

## 2016-02-13 NOTE — Anesthesia Preprocedure Evaluation (Addendum)
Anesthesia Evaluation  Patient identified by MRN, date of birth, ID band Patient awake    Reviewed: Allergy & Precautions, NPO status , Patient's Chart, lab work & pertinent test results  History of Anesthesia Complications Negative for: history of anesthetic complications  Airway Mallampati: II  TM Distance: >3 FB     Dental  (+) Edentulous Upper, Edentulous Lower, Dental Advisory Given   Pulmonary former smoker,    Pulmonary exam normal        Cardiovascular + CAD, + Past MI and + Peripheral Vascular Disease  Normal cardiovascular exam+ Valvular Problems/Murmurs AS   Cardiac Cath 01/2016 Conclusion     Prox RCA lesion, 100 %stenosed.  1st Mrg lesion, 30 %stenosed.  Mid LAD lesion, 20 %stenosed.  The left ventricular ejection fraction is 35-45% by visual estimate. Echo Study Conclusions  - Left ventricle: The cavity size was normal. There was moderate   concentric hypertrophy. Systolic function was normal. Wall motion   was normal; there were no regional wall motion abnormalities.   Doppler parameters are consistent with a reversible restrictive   pattern, indicative of decreased left ventricular diastolic   compliance and/or increased left atrial pressure (grade 3   diastolic dysfunction). - Aortic valve: Transvalvular velocity was increased. There was   mild stenosis. Mean gradient (S): 19 mm Hg. Valve area (VTI):   0.97 cm^2. Valve area (Vmax): 0.98 cm^2. Valve area (Vmean): 0.74   cm^2.     Neuro/Psych TIACVA, No Residual Symptoms    GI/Hepatic negative GI ROS, Neg liver ROS,   Endo/Other  negative endocrine ROS  Renal/GU Renal InsufficiencyRenal disease     Musculoskeletal   Abdominal   Peds  Hematology negative hematology ROS (+)   Anesthesia Other Findings   Reproductive/Obstetrics                           Anesthesia Physical Anesthesia Plan  ASA:  III  Anesthesia Plan: General   Post-op Pain Management:    Induction: Intravenous  Airway Management Planned: Oral ETT  Additional Equipment: Arterial line  Intra-op Plan:   Post-operative Plan: Extubation in OR  Informed Consent: I have reviewed the patients History and Physical, chart, labs and discussed the procedure including the risks, benefits and alternatives for the proposed anesthesia with the patient or authorized representative who has indicated his/her understanding and acceptance.   Dental advisory given  Plan Discussed with: CRNA, Anesthesiologist and Surgeon  Anesthesia Plan Comments:        Anesthesia Quick Evaluation

## 2016-02-14 ENCOUNTER — Inpatient Hospital Stay (HOSPITAL_COMMUNITY): Payer: Medicare HMO | Admitting: Vascular Surgery

## 2016-02-14 ENCOUNTER — Encounter (HOSPITAL_COMMUNITY)
Admission: RE | Disposition: A | Discharge status: Home Health | Payer: Self-pay | Source: Ambulatory Visit | Attending: Vascular Surgery

## 2016-02-14 ENCOUNTER — Encounter (HOSPITAL_COMMUNITY): Payer: Self-pay | Admitting: *Deleted

## 2016-02-14 ENCOUNTER — Other Ambulatory Visit: Payer: Self-pay

## 2016-02-14 ENCOUNTER — Inpatient Hospital Stay (HOSPITAL_COMMUNITY)
Admission: RE | Admit: 2016-02-14 | Discharge: 2016-02-16 | DRG: 038 | Disposition: A | Discharge status: Home Health | Payer: Medicare HMO | Source: Ambulatory Visit | Attending: Vascular Surgery | Admitting: Vascular Surgery

## 2016-02-14 DIAGNOSIS — Z8673 Personal history of transient ischemic attack (TIA), and cerebral infarction without residual deficits: Secondary | ICD-10-CM | POA: Diagnosis not present

## 2016-02-14 DIAGNOSIS — I6521 Occlusion and stenosis of right carotid artery: Principal | ICD-10-CM | POA: Diagnosis present

## 2016-02-14 DIAGNOSIS — I2583 Coronary atherosclerosis due to lipid rich plaque: Secondary | ICD-10-CM | POA: Diagnosis present

## 2016-02-14 DIAGNOSIS — I252 Old myocardial infarction: Secondary | ICD-10-CM | POA: Diagnosis not present

## 2016-02-14 DIAGNOSIS — I959 Hypotension, unspecified: Secondary | ICD-10-CM | POA: Diagnosis not present

## 2016-02-14 DIAGNOSIS — I2510 Atherosclerotic heart disease of native coronary artery without angina pectoris: Secondary | ICD-10-CM | POA: Diagnosis present

## 2016-02-14 DIAGNOSIS — D62 Acute posthemorrhagic anemia: Secondary | ICD-10-CM | POA: Diagnosis not present

## 2016-02-14 DIAGNOSIS — I350 Nonrheumatic aortic (valve) stenosis: Secondary | ICD-10-CM | POA: Diagnosis present

## 2016-02-14 DIAGNOSIS — K148 Other diseases of tongue: Secondary | ICD-10-CM | POA: Diagnosis not present

## 2016-02-14 DIAGNOSIS — R1310 Dysphagia, unspecified: Secondary | ICD-10-CM | POA: Diagnosis present

## 2016-02-14 DIAGNOSIS — Z87891 Personal history of nicotine dependence: Secondary | ICD-10-CM | POA: Diagnosis not present

## 2016-02-14 HISTORY — DX: Edema, unspecified: R60.9

## 2016-02-14 HISTORY — DX: Nonrheumatic aortic (valve) stenosis: I35.0

## 2016-02-14 HISTORY — PX: CAROTID ENDARTERECTOMY: SUR193

## 2016-02-14 HISTORY — PX: ENDARTERECTOMY: SHX5162

## 2016-02-14 HISTORY — PX: PATCH ANGIOPLASTY: SHX6230

## 2016-02-14 HISTORY — DX: Occlusion and stenosis of unspecified carotid artery: I65.29

## 2016-02-14 LAB — CBC
HCT: 51.4 % (ref 39.0–52.0)
Hemoglobin: 17 g/dL (ref 13.0–17.0)
MCH: 31.8 pg (ref 26.0–34.0)
MCHC: 33.1 g/dL (ref 30.0–36.0)
MCV: 96.3 fL (ref 78.0–100.0)
Platelets: 205 10*3/uL (ref 150–400)
RBC: 5.34 MIL/uL (ref 4.22–5.81)
RDW: 14.7 % (ref 11.5–15.5)
WBC: 12.9 10*3/uL — ABNORMAL HIGH (ref 4.0–10.5)

## 2016-02-14 LAB — URINALYSIS, ROUTINE W REFLEX MICROSCOPIC
Bilirubin Urine: NEGATIVE
Glucose, UA: NEGATIVE mg/dL
Ketones, ur: NEGATIVE mg/dL
Leukocytes, UA: NEGATIVE
Nitrite: NEGATIVE
Protein, ur: NEGATIVE mg/dL
Specific Gravity, Urine: 1.011 (ref 1.005–1.030)
pH: 5.5 (ref 5.0–8.0)

## 2016-02-14 LAB — CK TOTAL AND CKMB (NOT AT ARMC)
CK, MB: 1.4 ng/mL (ref 0.5–5.0)
Relative Index: INVALID (ref 0.0–2.5)
Total CK: 33 U/L — ABNORMAL LOW (ref 49–397)

## 2016-02-14 LAB — COMPREHENSIVE METABOLIC PANEL
ALT: 29 U/L (ref 17–63)
AST: 33 U/L (ref 15–41)
Albumin: 3.2 g/dL — ABNORMAL LOW (ref 3.5–5.0)
Alkaline Phosphatase: 124 U/L (ref 38–126)
Anion gap: 9 (ref 5–15)
BUN: 15 mg/dL (ref 6–20)
CO2: 24 mmol/L (ref 22–32)
Calcium: 8.6 mg/dL — ABNORMAL LOW (ref 8.9–10.3)
Chloride: 106 mmol/L (ref 101–111)
Creatinine, Ser: 1.25 mg/dL — ABNORMAL HIGH (ref 0.61–1.24)
GFR calc Af Amer: 58 mL/min — ABNORMAL LOW (ref >60)
GFR calc non Af Amer: 50 mL/min — ABNORMAL LOW (ref >60)
Glucose, Bld: 129 mg/dL — ABNORMAL HIGH (ref 65–99)
Potassium: 4.3 mmol/L (ref 3.5–5.1)
Sodium: 139 mmol/L (ref 135–145)
Total Bilirubin: 0.9 mg/dL (ref 0.3–1.2)
Total Protein: 6.6 g/dL (ref 6.5–8.1)

## 2016-02-14 LAB — DIFFERENTIAL
Basophils Absolute: 0.1 10*3/uL (ref 0.0–0.1)
Basophils Relative: 1 %
Eosinophils Absolute: 0.4 10*3/uL (ref 0.0–0.7)
Eosinophils Relative: 3 %
Lymphocytes Relative: 43 %
Lymphs Abs: 5.6 10*3/uL — ABNORMAL HIGH (ref 0.7–4.0)
Monocytes Absolute: 1.1 10*3/uL — ABNORMAL HIGH (ref 0.1–1.0)
Monocytes Relative: 8 %
Neutro Abs: 5.8 10*3/uL (ref 1.7–7.7)
Neutrophils Relative %: 45 %

## 2016-02-14 LAB — URINE MICROSCOPIC-ADD ON

## 2016-02-14 LAB — PROTIME-INR
INR: 1.05
Prothrombin Time: 13.7 seconds (ref 11.4–15.2)

## 2016-02-14 LAB — TROPONIN I
Troponin I: 0.06 ng/mL (ref <0.03)
Troponin I: 0.06 ng/mL (ref <0.03)

## 2016-02-14 LAB — ABO/RH: ABO/RH(D): A POS

## 2016-02-14 LAB — APTT: aPTT: 32 seconds (ref 24–36)

## 2016-02-14 LAB — MRSA PCR SCREENING: MRSA by PCR: NEGATIVE

## 2016-02-14 SURGERY — ENDARTERECTOMY, CAROTID
Anesthesia: General | Site: Neck | Laterality: Right

## 2016-02-14 MED ORDER — ASPIRIN EC 325 MG PO TBEC
325.0000 mg | DELAYED_RELEASE_TABLET | Freq: Every day | ORAL | Status: DC
Start: 2016-02-15 — End: 2016-02-16
  Filled 2016-02-14 (×2): qty 1

## 2016-02-14 MED ORDER — PROPOFOL 10 MG/ML IV BOLUS
INTRAVENOUS | Status: AC
  Filled 2016-02-14: qty 20

## 2016-02-14 MED ORDER — LIDOCAINE HCL (CARDIAC) 20 MG/ML IV SOLN
INTRAVENOUS | Status: DC | PRN
  Administered 2016-02-14: 60 mg via INTRAVENOUS

## 2016-02-14 MED ORDER — PROPOFOL 10 MG/ML IV BOLUS
INTRAVENOUS | Status: DC | PRN
  Administered 2016-02-14: 110 mg via INTRAVENOUS

## 2016-02-14 MED ORDER — SUGAMMADEX SODIUM 200 MG/2ML IV SOLN
INTRAVENOUS | Status: DC | PRN
  Administered 2016-02-14: 200 mg via INTRAVENOUS

## 2016-02-14 MED ORDER — SODIUM CHLORIDE 0.9 % IV SOLN
INTRAVENOUS | Status: DC | PRN
  Administered 2016-02-14: 12:00:00

## 2016-02-14 MED ORDER — MORPHINE SULFATE (PF) 2 MG/ML IV SOLN: 2.0000 mg | INTRAVENOUS | Status: DC | PRN

## 2016-02-14 MED ORDER — ONDANSETRON HCL 4 MG/2ML IJ SOLN
INTRAMUSCULAR | Status: AC
  Filled 2016-02-14: qty 2

## 2016-02-14 MED ORDER — ONDANSETRON HCL 4 MG/2ML IJ SOLN: 4.0000 mg | Freq: Four times a day (QID) | INTRAMUSCULAR | Status: DC | PRN

## 2016-02-14 MED ORDER — ALBUMIN HUMAN 5 % IV SOLN
INTRAVENOUS | Status: AC
  Filled 2016-02-14: qty 250

## 2016-02-14 MED ORDER — OXYCODONE-ACETAMINOPHEN 5-325 MG PO TABS: 1.0000 | ORAL_TABLET | ORAL | Status: DC | PRN

## 2016-02-14 MED ORDER — SODIUM CHLORIDE 0.9 % IV SOLN: INTRAVENOUS | Status: DC

## 2016-02-14 MED ORDER — ALUM & MAG HYDROXIDE-SIMETH 200-200-20 MG/5ML PO SUSP
15.0000 mL | ORAL | Status: DC | PRN
Start: 2016-02-14 — End: 2016-02-16

## 2016-02-14 MED ORDER — CEFUROXIME SODIUM 1.5 G IJ SOLR
1.5000 g | Freq: Two times a day (BID) | INTRAMUSCULAR | Status: AC
  Administered 2016-02-14 – 2016-02-15 (×2): 1.5 g via INTRAVENOUS
  Filled 2016-02-14 (×2): qty 1.5

## 2016-02-14 MED ORDER — DOCUSATE SODIUM 100 MG PO CAPS
100.0000 mg | ORAL_CAPSULE | Freq: Every day | ORAL | Status: DC
  Filled 2016-02-14 (×2): qty 1

## 2016-02-14 MED ORDER — MAGNESIUM SULFATE 2 GM/50ML IV SOLN: 2.0000 g | Freq: Every day | INTRAVENOUS | Status: DC | PRN

## 2016-02-14 MED ORDER — FENTANYL CITRATE (PF) 250 MCG/5ML IJ SOLN
INTRAMUSCULAR | Status: AC
  Filled 2016-02-14: qty 5

## 2016-02-14 MED ORDER — HEMOSTATIC AGENTS (NO CHARGE) OPTIME
TOPICAL | Status: DC | PRN
  Administered 2016-02-14: 1 via TOPICAL

## 2016-02-14 MED ORDER — DEXTROSE 5 % IV SOLN
1.5000 g | INTRAVENOUS | Status: AC
  Administered 2016-02-14: 1.5 g via INTRAVENOUS
  Filled 2016-02-14: qty 1.5

## 2016-02-14 MED ORDER — HYDRALAZINE HCL 20 MG/ML IJ SOLN: 5.0000 mg | INTRAMUSCULAR | Status: DC | PRN

## 2016-02-14 MED ORDER — GUAIFENESIN-DM 100-10 MG/5ML PO SYRP: 15.0000 mL | ORAL_SOLUTION | ORAL | Status: DC | PRN

## 2016-02-14 MED ORDER — PROMETHAZINE HCL 25 MG/ML IJ SOLN: 6.2500 mg | INTRAMUSCULAR | Status: DC | PRN

## 2016-02-14 MED ORDER — 0.9 % SODIUM CHLORIDE (POUR BTL) OPTIME
TOPICAL | Status: DC | PRN
  Administered 2016-02-14: 2000 mL

## 2016-02-14 MED ORDER — SODIUM CHLORIDE 0.9 % IV SOLN
INTRAVENOUS | Status: DC
  Administered 2016-02-14 (×2): via INTRAVENOUS

## 2016-02-14 MED ORDER — POTASSIUM CHLORIDE CRYS ER 20 MEQ PO TBCR: 20.0000 meq | EXTENDED_RELEASE_TABLET | Freq: Every day | ORAL | Status: DC | PRN

## 2016-02-14 MED ORDER — HYDROMORPHONE HCL 1 MG/ML IJ SOLN: 0.2500 mg | INTRAMUSCULAR | Status: DC | PRN

## 2016-02-14 MED ORDER — LIDOCAINE HCL (PF) 1 % IJ SOLN
INTRAMUSCULAR | Status: AC
  Filled 2016-02-14: qty 30

## 2016-02-14 MED ORDER — METOPROLOL TARTRATE 5 MG/5ML IV SOLN: 2.0000 mg | INTRAVENOUS | Status: DC | PRN

## 2016-02-14 MED ORDER — PHENOL 1.4 % MT LIQD
1.0000 | OROMUCOSAL | Status: DC | PRN
  Administered 2016-02-14: 1 via OROMUCOSAL
  Filled 2016-02-14: qty 177

## 2016-02-14 MED ORDER — SODIUM CHLORIDE 0.9 % IV SOLN
500.0000 mL | Freq: Once | INTRAVENOUS | Status: AC | PRN
  Administered 2016-02-14 (×2): 500 mL via INTRAVENOUS

## 2016-02-14 MED ORDER — GLYCOPYRROLATE 0.2 MG/ML IV SOSY
PREFILLED_SYRINGE | INTRAVENOUS | Status: DC | PRN
  Administered 2016-02-14 (×2): .2 mg via INTRAVENOUS

## 2016-02-14 MED ORDER — DEXTROSE 5 % IV SOLN
INTRAVENOUS | Status: DC | PRN
  Administered 2016-02-14: 10:00:00 via INTRAVENOUS
  Administered 2016-02-14: 80 ug/min via INTRAVENOUS

## 2016-02-14 MED ORDER — PANTOPRAZOLE SODIUM 40 MG PO TBEC
40.0000 mg | DELAYED_RELEASE_TABLET | Freq: Every day | ORAL | Status: DC
  Administered 2016-02-14 – 2016-02-16 (×2): 40 mg via ORAL
  Filled 2016-02-14 (×3): qty 1

## 2016-02-14 MED ORDER — LACTATED RINGERS IV SOLN
INTRAVENOUS | Status: DC | PRN
  Administered 2016-02-14 (×3): via INTRAVENOUS

## 2016-02-14 MED ORDER — SODIUM CHLORIDE 0.9 % IV SOLN
0.0125 ug/kg/min | INTRAVENOUS | Status: AC
  Administered 2016-02-14: .1 ug/kg/min via INTRAVENOUS
  Filled 2016-02-14: qty 2000

## 2016-02-14 MED ORDER — EPHEDRINE 5 MG/ML INJ
INTRAVENOUS | Status: AC
  Filled 2016-02-14: qty 10

## 2016-02-14 MED ORDER — ACETAMINOPHEN 325 MG RE SUPP: 325.0000 mg | RECTAL | Status: DC | PRN

## 2016-02-14 MED ORDER — SUCCINYLCHOLINE CHLORIDE 200 MG/10ML IV SOSY
PREFILLED_SYRINGE | INTRAVENOUS | Status: AC
  Filled 2016-02-14: qty 10

## 2016-02-14 MED ORDER — LABETALOL HCL 5 MG/ML IV SOLN: 10.0000 mg | INTRAVENOUS | Status: DC | PRN

## 2016-02-14 MED ORDER — ACETAMINOPHEN 325 MG PO TABS
325.0000 mg | ORAL_TABLET | ORAL | Status: DC | PRN
Start: 2016-02-14 — End: 2016-02-16
  Administered 2016-02-14 – 2016-02-15 (×2): 650 mg via ORAL
  Filled 2016-02-14 (×2): qty 2

## 2016-02-14 MED ORDER — ENOXAPARIN SODIUM 40 MG/0.4ML ~~LOC~~ SOLN
40.0000 mg | SUBCUTANEOUS | Status: DC
  Administered 2016-02-15: 40 mg via SUBCUTANEOUS
  Filled 2016-02-14: qty 0.4

## 2016-02-14 MED ORDER — PROTAMINE SULFATE 10 MG/ML IV SOLN
INTRAVENOUS | Status: DC | PRN
  Administered 2016-02-14 (×3): 10 mg via INTRAVENOUS

## 2016-02-14 MED ORDER — ALBUMIN HUMAN 5 % IV SOLN
INTRAVENOUS | Status: DC | PRN
  Administered 2016-02-14 (×3): via INTRAVENOUS

## 2016-02-14 MED ORDER — ROCURONIUM BROMIDE 100 MG/10ML IV SOLN
INTRAVENOUS | Status: DC | PRN
  Administered 2016-02-14: 10 mg via INTRAVENOUS
  Administered 2016-02-14: 50 mg via INTRAVENOUS
  Administered 2016-02-14 (×2): 10 mg via INTRAVENOUS

## 2016-02-14 MED ORDER — CHLORHEXIDINE GLUCONATE CLOTH 2 % EX PADS: 6.0000 | MEDICATED_PAD | Freq: Once | CUTANEOUS | Status: DC

## 2016-02-14 MED ORDER — FUROSEMIDE 20 MG PO TABS
20.0000 mg | ORAL_TABLET | Freq: Every day | ORAL | Status: DC
  Administered 2016-02-14 – 2016-02-16 (×2): 20 mg via ORAL
  Filled 2016-02-14 (×3): qty 1

## 2016-02-14 MED ORDER — HEPARIN SODIUM (PORCINE) 1000 UNIT/ML IJ SOLN
INTRAMUSCULAR | Status: DC | PRN
  Administered 2016-02-14: 7 mL via INTRAVENOUS
  Administered 2016-02-14: 3 mL via INTRAVENOUS
  Administered 2016-02-14: 2 mL via INTRAVENOUS

## 2016-02-14 MED ORDER — ONDANSETRON HCL 4 MG/2ML IJ SOLN
INTRAMUSCULAR | Status: DC | PRN
  Administered 2016-02-14: 4 mg via INTRAVENOUS

## 2016-02-14 MED ORDER — WHITE PETROLATUM GEL
Status: AC
  Administered 2016-02-14: 17:00:00
  Filled 2016-02-14: qty 1

## 2016-02-14 MED ORDER — ORAL CARE MOUTH RINSE
15.0000 mL | Freq: Two times a day (BID) | OROMUCOSAL | Status: DC
  Administered 2016-02-14 – 2016-02-16 (×2): 15 mL via OROMUCOSAL

## 2016-02-14 SURGICAL SUPPLY — 69 items
ADPR TBG 2 MALE LL ART (MISCELLANEOUS) ×1
ATTRACTOMAT 16X20 MAGNETIC DRP (DRAPES) ×2 IMPLANT
CANISTER SUCTION 2500CC (MISCELLANEOUS) ×2 IMPLANT
CATH ROBINSON RED A/P 18FR (CATHETERS) ×2 IMPLANT
CLIP TI MEDIUM 6 (CLIP) ×2 IMPLANT
CLIP TI WIDE RED SMALL 6 (CLIP) ×6 IMPLANT
COVER PROBE W GEL 5X96 (DRAPES) ×2 IMPLANT
CRADLE DONUT ADULT HEAD (MISCELLANEOUS) ×2 IMPLANT
DRAIN CHANNEL 15F RND FF W/TCR (WOUND CARE) IMPLANT
DRAPE INCISE IOBAN 66X45 STRL (DRAPES) ×2 IMPLANT
DRAPE ORTHO SPLIT 77X108 STRL (DRAPES) ×2
DRAPE SURG ORHT 6 SPLT 77X108 (DRAPES) ×1 IMPLANT
DRAPE WARM FLUID 44X44 (DRAPE) ×2 IMPLANT
ELECT REM PT RETURN 9FT ADLT (ELECTROSURGICAL) ×2
ELECTRODE REM PT RTRN 9FT ADLT (ELECTROSURGICAL) ×1 IMPLANT
EVACUATOR SILICONE 100CC (DRAIN) IMPLANT
GAUZE SPONGE 4X4 12PLY STRL (GAUZE/BANDAGES/DRESSINGS) ×2 IMPLANT
GLOVE BIO SURGEON STRL SZ 6.5 (GLOVE) ×2 IMPLANT
GLOVE BIO SURGEON STRL SZ7.5 (GLOVE) ×2 IMPLANT
GLOVE BIOGEL PI IND STRL 6.5 (GLOVE) ×3 IMPLANT
GLOVE BIOGEL PI IND STRL 7.0 (GLOVE) ×1 IMPLANT
GLOVE BIOGEL PI IND STRL 7.5 (GLOVE) ×1 IMPLANT
GLOVE BIOGEL PI INDICATOR 6.5 (GLOVE) ×3
GLOVE BIOGEL PI INDICATOR 7.0 (GLOVE) ×1
GLOVE BIOGEL PI INDICATOR 7.5 (GLOVE) ×1
GLOVE ECLIPSE 6.5 STRL STRAW (GLOVE) ×4 IMPLANT
GLOVE SURG SS PI 7.5 STRL IVOR (GLOVE) ×2 IMPLANT
GOWN STRL REUS W/ TWL LRG LVL3 (GOWN DISPOSABLE) ×4 IMPLANT
GOWN STRL REUS W/ TWL XL LVL3 (GOWN DISPOSABLE) ×2 IMPLANT
GOWN STRL REUS W/TWL LRG LVL3 (GOWN DISPOSABLE) ×8
GOWN STRL REUS W/TWL XL LVL3 (GOWN DISPOSABLE) ×4
HEMOSTAT SNOW SURGICEL 2X4 (HEMOSTASIS) ×2 IMPLANT
INSERT FOGARTY SM (MISCELLANEOUS) ×2 IMPLANT
IV ADAPTER SYR DOUBLE MALE LL (MISCELLANEOUS) ×2 IMPLANT
KIT BASIN OR (CUSTOM PROCEDURE TRAY) ×2 IMPLANT
KIT ROOM TURNOVER OR (KITS) ×2 IMPLANT
LIQUID BAND (GAUZE/BANDAGES/DRESSINGS) ×2 IMPLANT
LOOP VESSEL MAXI BLUE (MISCELLANEOUS) ×2 IMPLANT
NEEDLE HYPO 25GX1X1/2 BEV (NEEDLE) IMPLANT
NS IRRIG 1000ML POUR BTL (IV SOLUTION) ×6 IMPLANT
PACK CAROTID (CUSTOM PROCEDURE TRAY) IMPLANT
PACK CV ACCESS (CUSTOM PROCEDURE TRAY) ×2 IMPLANT
PAD ARMBOARD 7.5X6 YLW CONV (MISCELLANEOUS) ×4 IMPLANT
PATCH VASC XENOSURE 1CMX6CM (Vascular Products) ×4 IMPLANT
PATCH VASC XENOSURE 1X6 (Vascular Products) ×2 IMPLANT
SET COLLECT BLD 21X3/4 12 PB (MISCELLANEOUS) ×4 IMPLANT
SHUNT CAROTID BYPASS 10 (VASCULAR PRODUCTS) ×2 IMPLANT
SHUNT CAROTID BYPASS 12FRX15.5 (VASCULAR PRODUCTS) IMPLANT
SPONGE INTESTINAL PEANUT (DISPOSABLE) ×2 IMPLANT
SPONGE LAP 18X18 X RAY DECT (DISPOSABLE) ×2 IMPLANT
STOPCOCK 4 WAY LG BORE MALE ST (IV SETS) IMPLANT
SUT ETHILON 3 0 PS 1 (SUTURE) IMPLANT
SUT PROLENE 5 0 C 1 24 (SUTURE) ×2 IMPLANT
SUT PROLENE 6 0 BV (SUTURE) ×24 IMPLANT
SUT PROLENE 7 0 BV 1 (SUTURE) ×6 IMPLANT
SUT PROLENE 7 0 BV1 MDA (SUTURE) ×2 IMPLANT
SUT SILK 3 0 (SUTURE)
SUT SILK 3-0 18XBRD TIE 12 (SUTURE) IMPLANT
SUT VIC AB 2-0 CT1 27 (SUTURE) ×2
SUT VIC AB 2-0 CT1 TAPERPNT 27 (SUTURE) ×1 IMPLANT
SUT VIC AB 3-0 SH 27 (SUTURE) ×4
SUT VIC AB 3-0 SH 27X BRD (SUTURE) ×2 IMPLANT
SUT VICRYL 4-0 PS2 18IN ABS (SUTURE) IMPLANT
SYR 20CC LL (SYRINGE) ×4 IMPLANT
SYR CONTROL 10ML LL (SYRINGE) IMPLANT
TAPE UMBILICAL COTTON 1/8X30 (MISCELLANEOUS) ×2 IMPLANT
TUBING ART PRESS 48 MALE/FEM (TUBING) ×2 IMPLANT
TUBING EXTENTION W/L.L. (IV SETS) IMPLANT
WATER STERILE IRR 1000ML POUR (IV SOLUTION) ×2 IMPLANT

## 2016-02-14 NOTE — H&P (Signed)
     History and Physical Update  The patient was interviewed and re-examined.  The patient's previous History and Physical has been reviewed and is unchanged other than that his rash has improved.There is no change in the plan of care, proceed with R CEA.   Elgene Coral C. Donzetta Matters, MD [facility] [place] Office: [phone] Pager: [phone]   02/14/2016, 7:00 AM

## 2016-02-14 NOTE — Progress Notes (Signed)
Dr Donzetta Matters here to see pt, aware Troponin 0.06 & reviewed EKG. Also, aware of pt's c/o of needing to urinate, amts voided and bladder scan results. No new orders. OK to tx pt to 3S when bed available.

## 2016-02-14 NOTE — Anesthesia Postprocedure Evaluation (Signed)
Anesthesia Post Note  Patient: David Bradshaw  Procedure(s) Performed: Procedure(s) (LRB): RIGHT CAROTID ENDARTERECTOMY (Right) PATCH ANGIOPLASTY USING XENOSURE BOVINE PERICADIUM PATCH X2 (Right)  Patient location during evaluation: PACU Anesthesia Type: General Level of consciousness: sedated Pain management: pain level controlled Vital Signs Assessment: post-procedure vital signs reviewed and stable Respiratory status: spontaneous breathing and respiratory function stable Cardiovascular status: stable Anesthetic complications: no    Last Vitals:  Vitals:   02/14/16 1330 02/14/16 1345  BP: 107/76 112/71  Pulse: (!) 109 (!) 115  Resp: 17 19  Temp:      Last Pain:  Vitals:   02/14/16 1318  PainSc: 0-No pain                 Lennox Dolberry DANIEL

## 2016-02-14 NOTE — Progress Notes (Signed)
Low BP on adm to [place] here and aware. Dr Tobias Alexander updated by CRNA Koren Bound. NEO drip started by CRNA, Albumin infusing . Pt is awake & mentating clearly. Will cont to monitor closely.

## 2016-02-14 NOTE — Transfer of Care (Signed)
Immediate Anesthesia Transfer of Care Note  Patient: David Bradshaw  Procedure(s) Performed: Procedure(s): RIGHT CAROTID ENDARTERECTOMY (Right) PATCH ANGIOPLASTY USING XENOSURE BOVINE PERICADIUM PATCH X2 (Right)  Patient Location: PACU  Anesthesia Type:General  Level of Consciousness: awake, alert , oriented and patient cooperative  Airway & Oxygen Therapy: Patient Spontanous Breathing and Patient connected to nasal cannula oxygen  Post-op Assessment: Report given to RN, Post -op Vital signs reviewed and unstable, Anesthesiologist notified, Patient moving all extremities X 4 and Pt has tongue eeviation to the right, Dr. Donzetta Matters at bedside and aware. Pt hypotensive, Dr. Tobias Alexander notified, Neo gtt started at 40 mcg/min, Albumin 5% Hung., Pt alert and oriented   Post vital signs: Reviewed and stable  Last Vitals:  Vitals:   02/14/16 0623  BP: (!) 151/105  Pulse: 96  Resp: 20  Temp: 36.9 C    Last Pain: There were no vitals filed for this visit.       Complications: No apparent anesthesia complications

## 2016-02-14 NOTE — Op Note (Signed)
Patient name: VERLYN SAURMAN MRN: [id number] DOB: [birth year] Sex: male  02/14/2016 Pre-operative Diagnosis: Symptomatic right carotid artery stenosis Post-operative diagnosis:  Same Surgeon:  Servando Snare, MD Assistants: Annamarie Major, MD ; Virgina Jock, PA Procedure Performed:  1.  Right carotid endarterectomy with bovine pericardial patch angioplasty  2.  Intraoperative carotid duplex     Indications:  82 year old white male with recent history of right parietal stroke found to have significant stenosis of his right internal carotid artery. He was initially planned for right carotid stent but due to allergy to Plavix and below the he was later scheduled for carotid endarterectomy for which he presents on 02/13/2006.  Findings: The lesion extended very high on the right internal carotid artery above the level of the hypoglossal. The digastric muscle was divided as well as the occipital branch of the external carotid artery. He did have significant back pressure of 80 mmHg and did not necessitate a shunt. After initial bovine pericardial patch angioplasty Doppler demonstrated lack of flow in the internal carotid artery and a redo patch at a higher level was performed. Completion Doppler demonstrated selected signals with flow throughout diastole in the internal carotid and on duplex demonstrated flow as well with grayscale demonstrating no significant particular matter.  Procedure:  The patient was quickly identified taking the operating room where he was placed supine on the operating table general endotracheal anesthesia was induced is given antibiotics by anesthesia as well as arterial monitoring lines sterilely prepped and draped in the usual fashion. After timeout was called we made a longitudinal incision along the anterior border the sternocleidomastoid divided through the platysma down to the level of the internal jugular vein and divided several branches of the facial vein between  ties. We then identified our common carotid artery and placed a umbilical tape around this. At this time the patient was heparinized and ACT greater than 250 was maintained throughout the case. We then dissected up onto the external carotid artery encircled a second parathyroid branch a time in the external carotid artery vessel loop. Dissection persisted up onto the internal carotid artery where we identified the hypoglossal nerve. We did get to the level of the internal carotid artery that appeared to be high enough for clamping. However still see plaque higher than this. We initially placed a vessel loop then made the decision to divide the digastric muscle protecting the hypoglossal nerve. We also divided the ansa at the takeoff from the hypoglossal. Having fully mobilized the hypoglossal nerve we then had a clamp which appeared to be high enough for endarterectomy. Confirming ACT greater than 250 we pulled up tight on the internal vessel loop clamped the common and external and checked the back pressure after releasing our internal carotid artery vessel loop which was near 80 mmHg. Satisfied we did not shunt in the internal carotid artery was clamped. We opened from the common onto the internal with 11 blade followed by Potts scissors and encountered significant plaque. Unfortunately this extended all the way up to the level of our clamp. We dissected further and laser clamp higher. After performing endarterectomy starting from caudal to cephalad we did not have an endpoint. I attempted to tack the endpoint with a 7-0 suture this did not appear to work either. I then called by my partner Dr. Trula Slade to come to the operating room to assist. This time I divided the occipital branch of the external carotid artery for further exposure and extended up to clamp  higher on the internal carotid. After this performed endarterectomy which we were satisfied with an Endo patch angioplasty with 6-0 Prolene suture bovine  pericardial patch. Following this Doppler demonstrated no flow in the internal carotid artery with a thud at the level of the common carotid artery. This satisfied I then dissected further up onto the internal carotid artery at this point as high as I could possibly go and replaced the clamp. I then clamped the internal and external as well and opened the patch which demonstrated that it was kinking and likely occluded I then performed endarterectomy to the level of the clamp again as high as possibly could reach. Under bovine pericardial patch was dense fashioned and sewn with a 6-0 Prolene up to the highest part of the internal carotid artery. I did trim the other patch about the midpoint. At the end of our new patch was trimmed and these 2 were sewn together with 6-0 Prolene suture. I then allowed our internal carotid artery to backbleed and reclamped it flushed forward bled R common and external arteries and then opened the external followed by the common. After completing our anastomosis the internal was opened and Doppler demonstrated expected waveforms with flow throughout diastole. Duplex was then brought to the field and both color and grayscale demonstrated satisfactory result. Satisfied patient was given 30 mg of protamine and when hemostasis was obtained is closed with a 2-0 Vicryl at the level of platysma followed by 4-0 Monocryl to skin. Dermabond was placed on that is laterally from anesthesia was noted to be moving all of his extremities and following commands transferred to PACU in stable condition.  Blood loss: 800cc  No specimens      Damiyah Ditmars C. Donzetta Matters, MD [facility] [place] Office: [phone] Pager: [phone]

## 2016-02-14 NOTE — Progress Notes (Signed)
Pharmacy Consult: Antibiotics renal dose adjustment  42 YOM s/p R CEA, getting zinacef for post-op prophylaxis. Pharmacy is consulted for antibiotics renal dose adjustment. Scr 1.25, est. crcl ~35 ml/min. He received pre-op dose at 0800.  Plan: Zinacef 1.5 g IV Q 12 hrs x 2, next dose 1800 Pharmacy sign off.   Thanks.  Maryanna Shape, PharmD, BCPS  Clinical Pharmacist  Pager: [phone]

## 2016-02-14 NOTE — Progress Notes (Signed)
Pt's only c/o has been "I need to pee". He has been voiding small amts in urinal (60-120 cc), plus inc of urine. Bladder scan done-shows 55-71 cc. He does not appear distended. Awaiting Dr Donzetta Matters to come & check pt. NEO is off, SBP 100-110, MAP 61-75.

## 2016-02-14 NOTE — Progress Notes (Signed)
Dr Donzetta Matters here to check pt. Wants to try & wean NEO. He is going to OR and will check pt when he is done with his case. Weaning NEO.

## 2016-02-14 NOTE — Anesthesia Procedure Notes (Signed)
Procedure Name: Intubation Date/Time: 02/14/2016 7:48 AM Performed by: Carney Living Pre-anesthesia Checklist: Patient identified, Suction available, Emergency Drugs available, Patient being monitored and Timeout performed Patient Re-evaluated:Patient Re-evaluated prior to inductionOxygen Delivery Method: Circle system utilized Preoxygenation: Pre-oxygenation with 100% oxygen Intubation Type: IV induction Ventilation: Mask ventilation without difficulty and Oral airway inserted - appropriate to patient size Laryngoscope Size: Mac and 4 Grade View: Grade I Tube type: Oral Tube size: 7.5 mm Number of attempts: 1 Airway Equipment and Method: Stylet Placement Confirmation: ETT inserted through vocal cords under direct vision,  positive ETCO2 and breath sounds checked- equal and bilateral Secured at: 21 cm Tube secured with: Tape Dental Injury: Teeth and Oropharynx as per pre-operative assessment

## 2016-02-15 ENCOUNTER — Other Ambulatory Visit: Payer: Self-pay | Admitting: Family

## 2016-02-15 ENCOUNTER — Inpatient Hospital Stay (HOSPITAL_COMMUNITY): Payer: Medicare HMO

## 2016-02-15 ENCOUNTER — Encounter (HOSPITAL_COMMUNITY): Payer: Self-pay | Admitting: Vascular Surgery

## 2016-02-15 DIAGNOSIS — D72829 Elevated white blood cell count, unspecified: Secondary | ICD-10-CM

## 2016-02-15 LAB — BASIC METABOLIC PANEL
Anion gap: 8 (ref 5–15)
BUN: 12 mg/dL (ref 6–20)
CO2: 24 mmol/L (ref 22–32)
Calcium: 7.4 mg/dL — ABNORMAL LOW (ref 8.9–10.3)
Chloride: 107 mmol/L (ref 101–111)
Creatinine, Ser: 1.07 mg/dL (ref 0.61–1.24)
GFR calc Af Amer: >60 mL/min (ref >60)
GFR calc non Af Amer: >60 mL/min (ref >60)
Glucose, Bld: 93 mg/dL (ref 65–99)
Potassium: 4.2 mmol/L (ref 3.5–5.1)
Sodium: 139 mmol/L (ref 135–145)

## 2016-02-15 LAB — HEMOGLOBIN AND HEMATOCRIT, BLOOD
HCT: 38 % — ABNORMAL LOW (ref 39.0–52.0)
Hemoglobin: 12.2 g/dL — ABNORMAL LOW (ref 13.0–17.0)

## 2016-02-15 LAB — POCT I-STAT 4, (NA,K, GLUC, HGB,HCT)
Glucose, Bld: 233 mg/dL — ABNORMAL HIGH (ref 65–99)
HCT: 39 % (ref 39.0–52.0)
Hemoglobin: 13.3 g/dL (ref 13.0–17.0)
Potassium: 4 mmol/L (ref 3.5–5.1)
Sodium: 138 mmol/L (ref 135–145)

## 2016-02-15 LAB — PREPARE RBC (CROSSMATCH)

## 2016-02-15 LAB — CBC
HCT: 28.9 % — ABNORMAL LOW (ref 39.0–52.0)
Hemoglobin: 9.4 g/dL — ABNORMAL LOW (ref 13.0–17.0)
MCH: 31.1 pg (ref 26.0–34.0)
MCHC: 32.5 g/dL (ref 30.0–36.0)
MCV: 95.7 fL (ref 78.0–100.0)
Platelets: 154 10*3/uL (ref 150–400)
RBC: 3.02 MIL/uL — ABNORMAL LOW (ref 4.22–5.81)
RDW: 14.6 % (ref 11.5–15.5)
WBC: 8.3 10*3/uL (ref 4.0–10.5)

## 2016-02-15 LAB — POCT ACTIVATED CLOTTING TIME
Activated Clotting Time: 279 seconds
Activated Clotting Time: 296 seconds
Activated Clotting Time: 241 seconds

## 2016-02-15 LAB — TROPONIN I: Troponin I: 0.06 ng/mL (ref <0.03)

## 2016-02-15 MED ORDER — SODIUM CHLORIDE 0.9 % IV SOLN
Freq: Once | INTRAVENOUS | Status: AC
  Administered 2016-02-15: 08:00:00 via INTRAVENOUS

## 2016-02-15 MED ORDER — RESOURCE THICKENUP CLEAR PO POWD
ORAL | Status: DC | PRN
  Filled 2016-02-15 (×3): qty 125

## 2016-02-15 NOTE — Progress Notes (Addendum)
  [facility] Progress Note  Subjective  - POD #1  Choking when drinking liquids. Unable to eat. Denies any CP or SOB.   Objective Vitals:   02/15/16 0550 02/15/16 0630  BP: 104/60 106/66  Pulse: 93 99  Resp: (!) 21 (!) 23  Temp:      Intake/Output Summary (Last 24 hours) at 02/15/16 0725 Last data filed at 02/15/16 0400  Gross per 24 hour  Intake          5698.33 ml  Output             1350 ml  Net          4348.33 ml   Sitting in chair in NAD. Lungs clear. Right neck with ecchymosis and small hematoma.  Voice is hoarse with right tongue deviation.  5/5 strength equal upper and lower extremities. No smile asymmetry.    Assessment/Planning: 82 y.o. male is s/p: right carotid endarterectomy 1 Day Post-Op   -Having dysphagia, voice hoarseness and right tongue deviation this am. Otherwise, neuro intact. Will make NPO this am and order SLP evaluation.  -Small hematoma right neck. Will hold lovenox for DVT prophylaxis this am. Continue ASA.  -Required neo in the recovery room post-op. Still hypotensive overnight requiring two 500 cc boluses. SBP 80s this am. Has been tachycardic as well. Had 800 cc blood loss and today's H/H refects that. Will transfuse 2 units of pRBCs given symptomatic and underlying cardiac issues.  -Troponins have been stable. No CP.  -Patient did desat with ambulation. Wean 02 as tolerated.  -Keep in 3S today.    Alvia Grove 02/15/2016 7:25 AM --  Laboratory CBC    Component Value Date/Time   WBC 8.3 02/15/2016 0500   HGB 9.4 (L) 02/15/2016 0500   HCT 28.9 (L) 02/15/2016 0500   PLT 154 02/15/2016 0500    BMET    Component Value Date/Time   NA 139 02/15/2016 0500   K 4.2 02/15/2016 0500   CL 107 02/15/2016 0500   CO2 24 02/15/2016 0500   GLUCOSE 93 02/15/2016 0500   BUN 12 02/15/2016 0500   CREATININE 1.07 02/15/2016 0500   CALCIUM 7.4 (L) 02/15/2016 0500   GFRNONAA >60 02/15/2016 0500   GFRAA >60 02/15/2016 0500     COAG Lab Results  Component Value Date   INR 1.05 02/14/2016   INR 1.04 01/30/2016   No results found for: PTT  Antibiotics Anti-infectives    Start     Dose/Rate Route Frequency Ordered Stop   02/14/16 1800  cefUROXime (ZINACEF) 1.5 g in dextrose 5 % 50 mL IVPB     1.5 g 100 mL/hr over 30 Minutes Intravenous Every 12 hours 02/14/16 1604 02/15/16 0529   02/14/16 0535  cefUROXime (ZINACEF) 1.5 g in dextrose 5 % 50 mL IVPB     1.5 g 100 mL/hr over 30 Minutes Intravenous 30 min pre-op 02/14/16 0535 02/14/16 0758       Virgina Jock, PA-C [facility] Office: [phone] Pager: [phone] 02/15/2016 7:25 AM   I have independently interviewed patient and agree with PA assessment and plan above. Will get slp eval and transfuse for low bp. If he does not respond will involve cardiology. Asa only for now.  Cassadi Purdie C. Donzetta Matters, MD [facility] [place] Office: [phone] Pager: [phone]  Patient has acute blood loss anemia from extremely difficult surgery with 800cc blood loss.   Tenleigh Byer C. Donzetta Matters, MD

## 2016-02-15 NOTE — Progress Notes (Signed)
MBSS complete. Full report located under chart review in imaging section.  Recommend Dys 3 and nectar thick liquids, swallow twice after swallows and clear throat after every other bite/sip.   David Bradshaw.Ed [employer] [phone]

## 2016-02-15 NOTE — Progress Notes (Signed)
Pt given 2 534mL boluses throughout the night for soft BP's.  Pt denies difficulty swallowing, but constantly clears throat.  Incision swollen, puffy, but still soft. Tongue still deviated to R.  Pt ambulated nearly once around unit and to chair, last BP 106/66.  Will continue to monitor

## 2016-02-15 NOTE — Evaluation (Signed)
Clinical/Bedside Swallow Evaluation Patient Details  Name: David Bradshaw MRN: [id number] Date of Birth: July 25, 1926  Today's Date: 02/15/2016 Time: SLP Start Time (ACUTE ONLY): 0908 SLP Stop Time (ACUTE ONLY): 0932 SLP Time Calculation (min) (ACUTE ONLY): 24 min  Past Medical History:  Past Medical History:  Diagnosis Date  . Aortic stenosis    mild AS 01/2016 echo  . Carotid stenosis   . Chicken pox   . Coronary arteriosclerosis due to lipid rich plaque 01/30/2016  . Edema   . Myocardial infarct ([place])    1988 and 1990  . Stroke [facility])    Past Surgical History:  Past Surgical History:  Procedure Laterality Date  . CARDIAC CATHETERIZATION N/A 02/04/2016   Procedure: Left Heart Cath and Coronary Angiography;  Surgeon: Jolaine Artist, MD;  Location: [place] CV LAB;  Service: Cardiovascular;  Laterality: N/A;  . CAROTID ENDARTERECTOMY Right 02/14/2016  . CATARACT EXTRACTION    . testicle     right hydrocele   HPI:  82 yr old in hospital 8/16-8/25 with right parietal lobe infarction scheduled for right carotid endarterectomy. Developed difficulty swallowing and BSE iordered. PMH: CVA, MI, carotid stenosis.   Assessment / Plan / Recommendation Clinical Impression  Recommend an objective assessment due to clinical signs of aspiration primarily with thin and nectar. Suspect pharyngeal residue. Lingual deviation to right likely from recent CVA. Appears to have significant edema at incision site that may be affecting typical swallow function. MBS scheduled today at 10:30.    Aspiration Risk       Diet Recommendation  (pt can finish breakfast tray with thick nectar liquids)   Liquid Administration via: Cup;No straw    Other  Recommendations Oral Care Recommendations: Oral care BID   Follow up Recommendations   (TBD)    Frequency and Duration            Prognosis        Swallow Study   General HPI: 82 yr old in hospital 8/16-8/25 with right parietal lobe infarction  scheduled for right carotid endarterectomy. Developed difficulty swallowing and BSE iordered. PMH: CVA, MI, carotid stenosis. Previous Swallow Assessment:  (none) Diet Prior to this Study: Regular;Thin liquids Temperature Spikes Noted: No Respiratory Status: Nasal cannula History of Recent Intubation: Yes Length of Intubations (days):  (during surgery only) Date extubated: 02/14/16 Behavior/Cognition: Alert;Cooperative;Pleasant mood Oral Cavity Assessment: Within Functional Limits Oral Care Completed by SLP: No Oral Cavity - Dentition: Edentulous Vision: Functional for self-feeding Self-Feeding Abilities: Able to feed self Patient Positioning: Upright in bed Baseline Vocal Quality: Hoarse Volitional Cough: Strong Volitional Swallow: Able to elicit    Oral/Motor/Sensory Function Overall Oral Motor/Sensory Function: Mild impairment Facial ROM: Within Functional Limits Facial Symmetry: Within Functional Limits Lingual Symmetry: Abnormal symmetry right Mandible: Within Functional Limits   Ice Chips Ice chips: Not tested   Thin Liquid Thin Liquid: Impaired Presentation: Cup Pharyngeal  Phase Impairments: Suspected delayed Swallow;Multiple swallows;Throat Clearing - Immediate;Throat Clearing - Delayed;Cough - Immediate    Nectar Thick Nectar Thick Liquid: Impaired Presentation: Cup Pharyngeal Phase Impairments: Suspected delayed Swallow;Throat Clearing - Delayed;Multiple swallows (may have been from previous thin)   Honey Thick Honey Thick Liquid: Not tested   Puree Puree: Impaired Pharyngeal Phase Impairments: Throat Clearing - Delayed;Multiple swallows   Solid   GO   Solid: Impaired Pharyngeal Phase Impairments: Multiple swallows;Throat Clearing - Delayed        Houston Siren 02/15/2016,9:49 AM  Orbie Pyo Colvin Caroli[employer] [phone]

## 2016-02-15 NOTE — Care Management Note (Signed)
Case Management Note  Patient Details  Name: TREYLAN PAIS MRN: [id number] Date of Birth: [birth year]  Subjective/Objective:  Patient is s/p R CEA, has difficulty maintaining bp, dropped during the night, received 2 units of prbc's today.   Patient lives alone, pta indep.  He has son [name] [id number] who will be staying with him for a couple of days after discharge and then Marya Amsler will also be staying with him as well.  NCM will cont to follow for dc needs.                   Action/Plan:   Expected Discharge Date:                  Expected Discharge Plan:  [place]  In-House Referral:     Discharge planning Services  CM Consult  Post Acute Care Choice:    Choice offered to:     DME Arranged:    DME Agency:     HH Arranged:    [facility] Agency:     Status of Service:  In process, will continue to follow  If discussed at Long Length of Stay Meetings, dates discussed:    Additional Comments:  Zenon Mayo, RN 02/15/2016, 4:03 PM

## 2016-02-16 LAB — TYPE AND SCREEN
ABO/RH(D): A POS
Antibody Screen: NEGATIVE
Unit division: 0
Unit division: 0

## 2016-02-16 MED ORDER — OXYCODONE-ACETAMINOPHEN 5-325 MG PO TABS: 1.0000 | ORAL_TABLET | ORAL | 0 refills | Status: DC | PRN

## 2016-02-16 NOTE — Progress Notes (Addendum)
[facility] [place]  Subjective  - Doing well over all.  He has walked and voided.    Objective 107/63 82 98.5 F (36.9 C) (Oral) (!) 22 98%  Intake/Output Summary (Last 24 hours) at 02/16/16 0830 Last data filed at 02/16/16 0741  Gross per 24 hour  Intake          1003.83 ml  Output             1025 ml  Net           -21.17 ml    Speech slightly garbaled/hroseness, right tongue deviation Right neck incision soft with ecchymosis Heart RRR Lungs non labored breathing  Assessment/Planning: POD # 2 right carotid endarterectomy  HGB up 12.2 after 2 units PRBC Speech therapy evaluation Recommend Dys 3 and nectar thick liquids, swallow twice after swallows and clear throat after every other bite/sip.  We will send him home with a thicker for liquids at home. Wean off O2  COLLINS, EMMA MAUREEN 02/16/2016 8:30 AM --  Laboratory Lab Results:  Recent Labs  02/14/16 0617  02/15/16 0500 02/15/16 1826  WBC 12.9*  --  8.3  --   HGB 17.0  < > 9.4* 12.2*  HCT 51.4  < > 28.9* 38.0*  PLT 205  --  154  --   < > = values in this interval not displayed. BMET  Recent Labs  02/14/16 0617 02/14/16 1130 02/15/16 0500  NA 139 138 139  K 4.3 4.0 4.2  CL 106  --  107  CO2 24  --  24  GLUCOSE 129* 233* 93  BUN 15  --  12  CREATININE 1.25*  --  1.07  CALCIUM 8.6*  --  7.4*    COAG Lab Results  Component Value Date   INR 1.05 02/14/2016   INR 1.04 01/30/2016   No results found for: PTT   I have independently interviewed patient and agree with PA assessment and plan above. If he can wean from O2 can d/c today. Will see in office in 2 weeks.   Kairah Leoni C. Donzetta Matters, MD [facility] [place] Office: [phone] Pager: [phone]

## 2016-02-16 NOTE — Progress Notes (Signed)
CM met with pt in room to offer choice of home health agency.  Pt chooses [facility]now [facility]) to rendre HHRN/SLP.  Pt will be staying with son, David Bradshaw [address] [place], [place] [address] [phone].  David Bradshaw states he has rolling walker at home and pt denies need for additional DME.  Referral called to [facility] rep, [place]. No other CM needs were communicated.

## 2016-02-16 NOTE — Progress Notes (Signed)
1220 Vascular surgery MD paged regarding O2 desaturation during exertion/activities (walking and urination). 61 MD responded. Okay with oxygen requirements, pt may still D/C home once case management consults with pt.  Candie Mile, RN, Copywriter, advertising, Contractor.

## 2016-02-16 NOTE — Progress Notes (Signed)
Discussed and explained discharge instructions, prescriptions and follow up appt given to family. Pt going home with family via w/c with belongings.

## 2016-02-17 NOTE — Progress Notes (Addendum)
CM received call from [facility] post discharge and NOW states [facility] rescinds acceptance of pt.  CM called [facility], [facility], and [facility], all who either have no staffing or do not accept insurance.  CM called [facility] and spoke with Baker Janus who states pt won't be looked at until Monday and cannot start services until Tuesday.  CM called  referral to [facility] who accepted referral and requested I fax facesheet, orders, face to face, discharge summary, and H&P to [phone].  CM called family to explain the change of home health agency and [name] states [facility] will be fine.  CM faxed requested information with appropriate address and contact information.  No other CM needs were communicated.

## 2016-02-18 DIAGNOSIS — Z87891 Personal history of nicotine dependence: Secondary | ICD-10-CM | POA: Diagnosis not present

## 2016-02-18 DIAGNOSIS — R1310 Dysphagia, unspecified: Secondary | ICD-10-CM | POA: Diagnosis not present

## 2016-02-18 DIAGNOSIS — Z7982 Long term (current) use of aspirin: Secondary | ICD-10-CM | POA: Diagnosis not present

## 2016-02-18 DIAGNOSIS — Z8673 Personal history of transient ischemic attack (TIA), and cerebral infarction without residual deficits: Secondary | ICD-10-CM | POA: Diagnosis not present

## 2016-02-18 DIAGNOSIS — I2583 Coronary atherosclerosis due to lipid rich plaque: Secondary | ICD-10-CM | POA: Diagnosis not present

## 2016-02-18 DIAGNOSIS — Z48812 Encounter for surgical aftercare following surgery on the circulatory system: Secondary | ICD-10-CM | POA: Diagnosis not present

## 2016-02-18 DIAGNOSIS — I2510 Atherosclerotic heart disease of native coronary artery without angina pectoris: Secondary | ICD-10-CM | POA: Diagnosis not present

## 2016-02-20 ENCOUNTER — Telehealth: Payer: Self-pay | Admitting: Vascular Surgery

## 2016-02-20 NOTE — Telephone Encounter (Signed)
Sched appt 9/22 at 2:30. Spoke to son to inform them of appt.

## 2016-02-20 NOTE — Telephone Encounter (Signed)
-----   Message from Denman George, RN sent at 02/19/2016 11:47 AM EDT ----- Regarding: needs 2 week f/u with Dr. Donzetta Matters; s/p right CEA   ----- Message ----- From: Ulyses Amor, PA-C Sent: 02/16/2016   8:42 AM To: Vvs Charge Pool  F/U with Dr. Donzetta Matters in 2 weeks s/p difficult right CEA with hypoglossal neuropraxia sent home with speech therapy

## 2016-02-21 ENCOUNTER — Telehealth: Payer: Self-pay

## 2016-02-21 NOTE — Discharge Summary (Signed)
[facility] Discharge Summary   Patient ID:  HUSANI HELFER MRN: [id number] DOB/AGE: [birth year] 82 y.o.  Admit date: 02/14/2016 Discharge date: 02/16/2016 Date of Surgery: 02/14/2016 Surgeon: Surgeon(s): [name], MD Serafina Mitchell, MD  Admission Diagnosis: Right carotid artery stenosis I65.21  Discharge Diagnoses:  Right carotid artery stenosis I65.21  Secondary Diagnoses: Past Medical History:  Diagnosis Date  . Aortic stenosis    mild AS 01/2016 echo  . Carotid stenosis   . Chicken pox   . Coronary arteriosclerosis due to lipid rich plaque 01/30/2016  . Edema   . Myocardial infarct ([place])    1988 and 1990  . Stroke ([place])     Procedure(s): RIGHT CAROTID ENDARTERECTOMY PATCH ANGIOPLASTY USING XENOSURE BOVINE PERICADIUM PATCH X2  Discharged Condition: good  HPI: This is a 82 y.o. male with history o MI x2 treated with angioplasty presented yesterday with a history of left upper extremity weakness and numbness. The symptoms lasted 20 minutes and were without slurring of speech, loc, changes in vision or lower extremity symptoms. He denies history of cva, tia or amaurosis. MRI revealed punctate parietal lobe acute infarcts. He takes full strength aspirin every other day and no other medications. He is able to walk a flight of stairs but is limited by shortness of breath. No chest pain, can lie flat. Former smoker quit 30 years ago.    Hospital Course:  AURION RISI is a 82 y.o. male is S/P  Procedure(s): RIGHT CAROTID ENDARTERECTOMY PATCH ANGIOPLASTY USING [place] X2  Sitting in chair in NAD. Lungs clear. Right neck with ecchymosis and small hematoma.  Voice is hoarse with right tongue deviation.  5/5 strength equal upper and lower extremities. No smile asymmetry.    Assessment/Planning: 82 y.o. male is s/p: right carotid endarterectomy 1 Day Post-Op   -Having dysphagia, voice hoarseness and right  tongue deviation this am. Otherwise, neuro intact. Will make NPO this am and order SLP evaluation.  -Small hematoma right neck. Will hold lovenox for DVT prophylaxis this am. Continue ASA.  -Required neo in the recovery room post-op. Still hypotensive overnight requiring two 500 cc boluses. SBP 80s this am. Has been tachycardic as well. Had 800 cc blood loss and today's H/H refects that. Will transfuse 2 units of pRBCs given symptomatic and underlying cardiac issues.  -Troponins have been stable. No CP.  -Patient did desat with ambulation. Wean 02 as tolerated.  -Keep in 3S today. Consults:  Speech consult 02/15/2016 Recommend an objective assessment due to clinical signs of aspiration primarily with thin and nectar. Suspect pharyngeal residue. Lingual deviation to right likely from recent CVA. Appears to have significant edema at incision site that may be affecting typical swallow function.  Recommend Dys 3 and nectar thick liquids, swallow twice after swallows and clear throat after  every other bite/sip.  Speech slightly garbaled/hroseness, right tongue deviation Right neck incision soft with ecchymosis Heart RRR Lungs non labored breathing  Assessment/Planning: POD # 2 right carotid endarterectomy  HGB up 12.2 after 2 units PRBC Speech therapy evaluation Recommend Dys 3 and nectar thick liquids, swallow twice after swallows and clear throat after every other bite/sip.  We will send him home with a thicker for liquids at home. Wean off O2  Maintained 92% o2 sat on room air Discharge home in stable condition. HH ordered with speech and RN.  Significant Diagnostic Studies: CBC Lab Results  Component Value Date   WBC 8.3 02/15/2016  HGB 12.2 (L) 02/15/2016   HCT 38.0 (L) 02/15/2016   MCV 95.7 02/15/2016   PLT 154 02/15/2016    BMET    Component Value Date/Time   NA 139 02/15/2016 0500   K 4.2 02/15/2016 0500   CL 107 02/15/2016 0500   CO2 24 02/15/2016 0500   GLUCOSE  93 02/15/2016 0500   BUN 12 02/15/2016 0500   CREATININE 1.07 02/15/2016 0500   CALCIUM 7.4 (L) 02/15/2016 0500   GFRNONAA >60 02/15/2016 0500   GFRAA >60 02/15/2016 0500   COAG Lab Results  Component Value Date   INR 1.05 02/14/2016   INR 1.04 01/30/2016     Disposition:  Discharge to :Home Discharge Instructions    Call MD for:  redness, tenderness, or signs of infection (pain, swelling, bleeding, redness, odor or green/yellow discharge around incision site)    Complete by:  As directed   Call MD for:  severe or increased pain, loss or decreased feeling  in affected limb(s)    Complete by:  As directed   Call MD for:  temperature >100.5    Complete by:  As directed   Discharge instructions    Complete by:  As directed   You may shower daily   Driving Restrictions    Complete by:  As directed   No driving for 1 week   Lifting restrictions    Complete by:  As directed   No lifting for 4 weeks   Resume previous diet    Complete by:  As directed       Medication List    TAKE these medications   aspirin EC 325 MG tablet Take 1 tablet (325 mg total) by mouth daily.   furosemide 20 MG tablet Commonly known as:  LASIX Take 1 tablet (20 mg total) by mouth daily.   oxyCODONE-acetaminophen 5-325 MG tablet Commonly known as:  PERCOCET/ROXICET Take 1 tablet by mouth every 4 (four) hours as needed for moderate pain.      Verbal and written Discharge instructions given to the patient. Wound care per Discharge AVS Follow-up Information    Servando Snare, MD Follow up in 2 week(s).   Specialties:  Vascular Surgery, Cardiology Why:  office will call Contact information: [place] [address] [place] .   Why:  now known as [facility] will provide home health nurse and speech therapy Contact information: [address] [place] [address] [phone]           Signed: Roxy Horseman 02/21/2016,  8:37 AM --- For VQI Registry use --- Instructions: Press F2 to tab through selections.  Delete question if not applicable.   Modified Rankin score at D/C (0-6): Rankin Score=1  IV medication needed for:  1. Hypertension: No 2. Hypotension: No  Post-op Complications: Yes  1. Post-op CVA or TIA: No  If yes: Event classification (right eye, left eye, right cortical, left cortical, verterobasilar, other): swallowing difficulty  If yes: Timing of event (intra-op, <6 hrs post-op, >=6 hrs post-op, unknown): post op  2. CN injury: Yes stretch temporary   If yes: CN IX injuried   3. Myocardial infarction: No  If yes: Dx by (EKG or clinical, Troponin):   4.  CHF: No  5.  Dysrhythmia (new): No  6. Wound infection: No  7. Reperfusion symptoms: No  8. Return to OR: No  If yes: return to OR for (  bleeding, neurologic, other CEA incision, other):   Discharge medications: Statin use:  No  for medical reason   ASA use:  Yes Beta blocker use:  No  for medical reason   ACE-Inhibitor use:  No  for medical reason   P2Y12 Antagonist use: [ x] None, [ ]  Plavix, [ ]  Plasugrel, [ ]  Ticlopinine, [ ]  Ticagrelor, [ ]  Other, [ ]  No for medical reason, [ ]  Non-compliant, [ ]  Not-indicated Anti-coagulant use:  [x ] None, [ ]  Warfarin, [ ]  Rivaroxaban, [ ]  Dabigatran, [ ]  Other, [ ]  No for medical reason, [ ]  Non-compliant, [ ]  Not-indicated

## 2016-02-21 NOTE — Telephone Encounter (Signed)
Verbal Orders  Home health care, was d/c from hospital  2x a week for 6 weeks  & a PT consultants  Hinton Dyer [phone]

## 2016-02-22 DIAGNOSIS — I2583 Coronary atherosclerosis due to lipid rich plaque: Secondary | ICD-10-CM | POA: Diagnosis not present

## 2016-02-22 DIAGNOSIS — Z8673 Personal history of transient ischemic attack (TIA), and cerebral infarction without residual deficits: Secondary | ICD-10-CM | POA: Diagnosis not present

## 2016-02-22 DIAGNOSIS — Z48812 Encounter for surgical aftercare following surgery on the circulatory system: Secondary | ICD-10-CM | POA: Diagnosis not present

## 2016-02-22 DIAGNOSIS — Z87891 Personal history of nicotine dependence: Secondary | ICD-10-CM | POA: Diagnosis not present

## 2016-02-22 DIAGNOSIS — Z7982 Long term (current) use of aspirin: Secondary | ICD-10-CM | POA: Diagnosis not present

## 2016-02-22 DIAGNOSIS — R1310 Dysphagia, unspecified: Secondary | ICD-10-CM | POA: Diagnosis not present

## 2016-02-22 DIAGNOSIS — I2510 Atherosclerotic heart disease of native coronary artery without angina pectoris: Secondary | ICD-10-CM | POA: Diagnosis not present

## 2016-02-22 NOTE — Telephone Encounter (Signed)
Gave verbal ok per Greg. 

## 2016-02-25 DIAGNOSIS — Z8673 Personal history of transient ischemic attack (TIA), and cerebral infarction without residual deficits: Secondary | ICD-10-CM | POA: Diagnosis not present

## 2016-02-25 DIAGNOSIS — R1310 Dysphagia, unspecified: Secondary | ICD-10-CM | POA: Diagnosis not present

## 2016-02-25 DIAGNOSIS — Z87891 Personal history of nicotine dependence: Secondary | ICD-10-CM | POA: Diagnosis not present

## 2016-02-25 DIAGNOSIS — I2510 Atherosclerotic heart disease of native coronary artery without angina pectoris: Secondary | ICD-10-CM | POA: Diagnosis not present

## 2016-02-25 DIAGNOSIS — Z7982 Long term (current) use of aspirin: Secondary | ICD-10-CM | POA: Diagnosis not present

## 2016-02-25 DIAGNOSIS — I2583 Coronary atherosclerosis due to lipid rich plaque: Secondary | ICD-10-CM | POA: Diagnosis not present

## 2016-02-25 DIAGNOSIS — Z48812 Encounter for surgical aftercare following surgery on the circulatory system: Secondary | ICD-10-CM | POA: Diagnosis not present

## 2016-02-27 DIAGNOSIS — Z87891 Personal history of nicotine dependence: Secondary | ICD-10-CM | POA: Diagnosis not present

## 2016-02-27 DIAGNOSIS — Z7982 Long term (current) use of aspirin: Secondary | ICD-10-CM | POA: Diagnosis not present

## 2016-02-27 DIAGNOSIS — Z48812 Encounter for surgical aftercare following surgery on the circulatory system: Secondary | ICD-10-CM | POA: Diagnosis not present

## 2016-02-27 DIAGNOSIS — R1310 Dysphagia, unspecified: Secondary | ICD-10-CM | POA: Diagnosis not present

## 2016-02-27 DIAGNOSIS — I2510 Atherosclerotic heart disease of native coronary artery without angina pectoris: Secondary | ICD-10-CM | POA: Diagnosis not present

## 2016-02-27 DIAGNOSIS — I2583 Coronary atherosclerosis due to lipid rich plaque: Secondary | ICD-10-CM | POA: Diagnosis not present

## 2016-02-27 DIAGNOSIS — Z8673 Personal history of transient ischemic attack (TIA), and cerebral infarction without residual deficits: Secondary | ICD-10-CM | POA: Diagnosis not present

## 2016-02-28 DIAGNOSIS — Z87891 Personal history of nicotine dependence: Secondary | ICD-10-CM | POA: Diagnosis not present

## 2016-02-28 DIAGNOSIS — R1310 Dysphagia, unspecified: Secondary | ICD-10-CM | POA: Diagnosis not present

## 2016-02-28 DIAGNOSIS — Z7982 Long term (current) use of aspirin: Secondary | ICD-10-CM | POA: Diagnosis not present

## 2016-02-28 DIAGNOSIS — Z48812 Encounter for surgical aftercare following surgery on the circulatory system: Secondary | ICD-10-CM | POA: Diagnosis not present

## 2016-02-28 DIAGNOSIS — Z8673 Personal history of transient ischemic attack (TIA), and cerebral infarction without residual deficits: Secondary | ICD-10-CM | POA: Diagnosis not present

## 2016-02-28 DIAGNOSIS — I2583 Coronary atherosclerosis due to lipid rich plaque: Secondary | ICD-10-CM | POA: Diagnosis not present

## 2016-02-28 DIAGNOSIS — I2510 Atherosclerotic heart disease of native coronary artery without angina pectoris: Secondary | ICD-10-CM | POA: Diagnosis not present

## 2016-03-03 ENCOUNTER — Encounter: Payer: Self-pay | Admitting: Vascular Surgery

## 2016-03-03 DIAGNOSIS — R1310 Dysphagia, unspecified: Secondary | ICD-10-CM | POA: Diagnosis not present

## 2016-03-03 DIAGNOSIS — Z8673 Personal history of transient ischemic attack (TIA), and cerebral infarction without residual deficits: Secondary | ICD-10-CM | POA: Diagnosis not present

## 2016-03-03 DIAGNOSIS — I2510 Atherosclerotic heart disease of native coronary artery without angina pectoris: Secondary | ICD-10-CM | POA: Diagnosis not present

## 2016-03-03 DIAGNOSIS — Z48812 Encounter for surgical aftercare following surgery on the circulatory system: Secondary | ICD-10-CM | POA: Diagnosis not present

## 2016-03-03 DIAGNOSIS — Z87891 Personal history of nicotine dependence: Secondary | ICD-10-CM | POA: Diagnosis not present

## 2016-03-03 DIAGNOSIS — I2583 Coronary atherosclerosis due to lipid rich plaque: Secondary | ICD-10-CM | POA: Diagnosis not present

## 2016-03-03 DIAGNOSIS — Z7982 Long term (current) use of aspirin: Secondary | ICD-10-CM | POA: Diagnosis not present

## 2016-03-04 DIAGNOSIS — R1310 Dysphagia, unspecified: Secondary | ICD-10-CM | POA: Diagnosis not present

## 2016-03-04 DIAGNOSIS — I2583 Coronary atherosclerosis due to lipid rich plaque: Secondary | ICD-10-CM | POA: Diagnosis not present

## 2016-03-04 DIAGNOSIS — I2510 Atherosclerotic heart disease of native coronary artery without angina pectoris: Secondary | ICD-10-CM | POA: Diagnosis not present

## 2016-03-04 DIAGNOSIS — Z8673 Personal history of transient ischemic attack (TIA), and cerebral infarction without residual deficits: Secondary | ICD-10-CM | POA: Diagnosis not present

## 2016-03-04 DIAGNOSIS — Z7982 Long term (current) use of aspirin: Secondary | ICD-10-CM | POA: Diagnosis not present

## 2016-03-04 DIAGNOSIS — Z87891 Personal history of nicotine dependence: Secondary | ICD-10-CM | POA: Diagnosis not present

## 2016-03-04 DIAGNOSIS — Z48812 Encounter for surgical aftercare following surgery on the circulatory system: Secondary | ICD-10-CM | POA: Diagnosis not present

## 2016-03-07 ENCOUNTER — Ambulatory Visit (INDEPENDENT_AMBULATORY_CARE_PROVIDER_SITE_OTHER): Payer: Medicare HMO | Admitting: Vascular Surgery

## 2016-03-07 ENCOUNTER — Encounter: Payer: Self-pay | Admitting: Vascular Surgery

## 2016-03-07 VITALS — BP 119/84 | HR 109 | Temp 97.0°F | Resp 16 | Ht 60.0 in | Wt 150.0 lb

## 2016-03-07 DIAGNOSIS — I6521 Occlusion and stenosis of right carotid artery: Secondary | ICD-10-CM

## 2016-03-07 NOTE — Progress Notes (Signed)
Subjective:     Patient ID: David Bradshaw, male   DOB: September 26, 1926, 82 y.o.   MRN: [id number]  HPI Mr. David Bradshaw returns for follow-up of his extremely difficult right carotid endarterectomy that was done for symptomatic disease. He continues on his aspirin he was allergic to both Plavix and ballooned to preoperatively that was occlusion for carotid stenting. He does have some hoarseness of his voice some mild tongue deviation to the right but otherwise his swallowing has completely resolved and he has had no pain.   Review of Systems  Constitutional: Negative.   HENT: Positive for voice change.   Eyes: Negative.   Respiratory: Negative.   Cardiovascular: Negative.   Gastrointestinal: Negative.   Musculoskeletal: Negative.   Skin: Negative for rash.       Objective:   Physical Exam  Constitutional: He appears well-developed.  Neck:  Healing incision  Cardiovascular: Normal rate.   Pulmonary/Chest: Effort normal.  Neurological: He is alert.  Tongue deviation to right improved from hospital       Assessment:  A 82-year-old white male here for follow-up of recent right carotid endarterectomy for symptomatic disease he is now no symptoms with normal swallowing does still complain of hoarseness and has some mild tongue deviation that is improving from preoperatively.    Plan:  #1 continue aspirin.  #2 follow-up in 6 months with repeat duplex  Kyston Gonce C. Donzetta Matters, MD [facility] Office: [phone] Pager: [phone]

## 2016-03-10 ENCOUNTER — Telehealth: Payer: Self-pay | Admitting: Family

## 2016-03-10 DIAGNOSIS — Z87891 Personal history of nicotine dependence: Secondary | ICD-10-CM | POA: Diagnosis not present

## 2016-03-10 DIAGNOSIS — Z7982 Long term (current) use of aspirin: Secondary | ICD-10-CM | POA: Diagnosis not present

## 2016-03-10 DIAGNOSIS — I2510 Atherosclerotic heart disease of native coronary artery without angina pectoris: Secondary | ICD-10-CM | POA: Diagnosis not present

## 2016-03-10 DIAGNOSIS — I2583 Coronary atherosclerosis due to lipid rich plaque: Secondary | ICD-10-CM | POA: Diagnosis not present

## 2016-03-10 DIAGNOSIS — Z8673 Personal history of transient ischemic attack (TIA), and cerebral infarction without residual deficits: Secondary | ICD-10-CM | POA: Diagnosis not present

## 2016-03-10 DIAGNOSIS — R1310 Dysphagia, unspecified: Secondary | ICD-10-CM | POA: Diagnosis not present

## 2016-03-10 DIAGNOSIS — Z48812 Encounter for surgical aftercare following surgery on the circulatory system: Secondary | ICD-10-CM | POA: Diagnosis not present

## 2016-03-10 MED ORDER — FUROSEMIDE 20 MG PO TABS: 20.0000 mg | ORAL_TABLET | Freq: Every day | ORAL | 1 refills | Status: DC

## 2016-03-10 NOTE — Telephone Encounter (Signed)
Ok with me 

## 2016-03-10 NOTE — Telephone Encounter (Signed)
He has not been taking the medication but he is needing it. Therefore please continue. Will send in new prescription.

## 2016-03-10 NOTE — Telephone Encounter (Signed)
vanessa is calling to make [place] aware of the patients vital signs . Her signal was very poor and I was unable to understand what she was saying. She is asking that you give her a call back and the will update you on his vitals at that point.

## 2016-03-10 NOTE — Telephone Encounter (Signed)
HR was 108, BP 126/90  1+ edema in feet and ankles Stopped lasix and told home health that he was taken off of it. She wanted to verify if that was true. Please advise

## 2016-03-10 NOTE — Telephone Encounter (Signed)
°  Pt's son called - he would like to transfer care from [facility] to [facility] due to [facility] being closer to home.  Let me know if this is ok with both of you.   cb number is [phone] Thank you

## 2016-03-10 NOTE — Telephone Encounter (Signed)
Okay with me 

## 2016-03-11 MED ORDER — FUROSEMIDE 20 MG PO TABS: 20.0000 mg | ORAL_TABLET | Freq: Every day | ORAL | 1 refills | Status: DC

## 2016-03-11 NOTE — Telephone Encounter (Signed)
Pt son [name]) informed furosemide.

## 2016-03-11 NOTE — Telephone Encounter (Signed)
erx sent and pt and son informed.

## 2016-03-12 DIAGNOSIS — Z7982 Long term (current) use of aspirin: Secondary | ICD-10-CM | POA: Diagnosis not present

## 2016-03-12 DIAGNOSIS — I2510 Atherosclerotic heart disease of native coronary artery without angina pectoris: Secondary | ICD-10-CM | POA: Diagnosis not present

## 2016-03-12 DIAGNOSIS — I2583 Coronary atherosclerosis due to lipid rich plaque: Secondary | ICD-10-CM | POA: Diagnosis not present

## 2016-03-12 DIAGNOSIS — Z87891 Personal history of nicotine dependence: Secondary | ICD-10-CM | POA: Diagnosis not present

## 2016-03-12 DIAGNOSIS — Z48812 Encounter for surgical aftercare following surgery on the circulatory system: Secondary | ICD-10-CM | POA: Diagnosis not present

## 2016-03-12 DIAGNOSIS — Z8673 Personal history of transient ischemic attack (TIA), and cerebral infarction without residual deficits: Secondary | ICD-10-CM | POA: Diagnosis not present

## 2016-03-12 DIAGNOSIS — R1310 Dysphagia, unspecified: Secondary | ICD-10-CM | POA: Diagnosis not present

## 2016-03-12 NOTE — Telephone Encounter (Signed)
Pt shceduled for 10/5  Son aware

## 2016-03-14 DIAGNOSIS — R1310 Dysphagia, unspecified: Secondary | ICD-10-CM | POA: Diagnosis not present

## 2016-03-14 DIAGNOSIS — I2583 Coronary atherosclerosis due to lipid rich plaque: Secondary | ICD-10-CM | POA: Diagnosis not present

## 2016-03-14 DIAGNOSIS — Z7982 Long term (current) use of aspirin: Secondary | ICD-10-CM | POA: Diagnosis not present

## 2016-03-14 DIAGNOSIS — Z87891 Personal history of nicotine dependence: Secondary | ICD-10-CM | POA: Diagnosis not present

## 2016-03-14 DIAGNOSIS — Z8673 Personal history of transient ischemic attack (TIA), and cerebral infarction without residual deficits: Secondary | ICD-10-CM | POA: Diagnosis not present

## 2016-03-14 DIAGNOSIS — Z48812 Encounter for surgical aftercare following surgery on the circulatory system: Secondary | ICD-10-CM | POA: Diagnosis not present

## 2016-03-14 DIAGNOSIS — I2510 Atherosclerotic heart disease of native coronary artery without angina pectoris: Secondary | ICD-10-CM | POA: Diagnosis not present

## 2016-03-19 DIAGNOSIS — Z7982 Long term (current) use of aspirin: Secondary | ICD-10-CM | POA: Diagnosis not present

## 2016-03-19 DIAGNOSIS — Z8673 Personal history of transient ischemic attack (TIA), and cerebral infarction without residual deficits: Secondary | ICD-10-CM | POA: Diagnosis not present

## 2016-03-19 DIAGNOSIS — Z87891 Personal history of nicotine dependence: Secondary | ICD-10-CM | POA: Diagnosis not present

## 2016-03-19 DIAGNOSIS — R1310 Dysphagia, unspecified: Secondary | ICD-10-CM | POA: Diagnosis not present

## 2016-03-19 DIAGNOSIS — I2583 Coronary atherosclerosis due to lipid rich plaque: Secondary | ICD-10-CM | POA: Diagnosis not present

## 2016-03-19 DIAGNOSIS — I2510 Atherosclerotic heart disease of native coronary artery without angina pectoris: Secondary | ICD-10-CM | POA: Diagnosis not present

## 2016-03-19 DIAGNOSIS — Z48812 Encounter for surgical aftercare following surgery on the circulatory system: Secondary | ICD-10-CM | POA: Diagnosis not present

## 2016-03-20 ENCOUNTER — Encounter: Payer: Self-pay | Admitting: Primary Care

## 2016-03-20 ENCOUNTER — Ambulatory Visit (INDEPENDENT_AMBULATORY_CARE_PROVIDER_SITE_OTHER): Payer: Medicare HMO | Admitting: Primary Care

## 2016-03-20 VITALS — BP 124/84 | HR 113 | Temp 98.0°F | Ht 60.0 in | Wt 153.1 lb

## 2016-03-20 DIAGNOSIS — I63231 Cerebral infarction due to unspecified occlusion or stenosis of right carotid arteries: Secondary | ICD-10-CM | POA: Diagnosis not present

## 2016-03-20 DIAGNOSIS — N183 Chronic kidney disease, stage 3 unspecified: Secondary | ICD-10-CM

## 2016-03-20 DIAGNOSIS — R21 Rash and other nonspecific skin eruption: Secondary | ICD-10-CM

## 2016-03-20 DIAGNOSIS — E785 Hyperlipidemia, unspecified: Secondary | ICD-10-CM | POA: Diagnosis not present

## 2016-03-20 MED ORDER — PREDNISONE 10 MG PO TABS: ORAL_TABLET | ORAL | 0 refills | Status: DC

## 2016-03-20 NOTE — Assessment & Plan Note (Signed)
Does not appear to be drug related. More contact dermatitis appearing. No evidence of bedbugs or scabies. Very dry skin to entire body, strongly encouraged daily moisturizer. Treatment with low-dose prednisone taper as rash is widespread. He is to notify me if no improvement in 3-4 days.

## 2016-03-20 NOTE — Patient Instructions (Addendum)
Start prednisone tablets. Take three tablets for 2 days, then two tablets for 2 days, then one tablet for 2 days.  Apply a daily moisturizer/lotion to your skin as it is very dry.   Please notify me if no improvement in your rash in 3-4 days.  Please schedule a physical with me in 6 months. You may also schedule a lab only appointment 3-4 days prior. We will discuss your lab results in detail during your physical.  It was a pleasure to meet you today! Please don't hesitate to call me with any questions. Welcome to [facility]!

## 2016-03-20 NOTE — Assessment & Plan Note (Signed)
Recent BMP with evidence of stable functioning kidneys.

## 2016-03-20 NOTE — Assessment & Plan Note (Signed)
Underwent right endarterectomy in late August given inability to take Plavix or Brilinta. Managed on ASA. Neuro exam negative today. Precautions provided to patient regarding warning signs of recurrent stroke.

## 2016-03-20 NOTE — Progress Notes (Signed)
Subjective:    Patient ID: David Bradshaw, male    DOB: 02/28/1927, 82 y.o.   MRN: [id number]  HPI  David Bradshaw is a 82 year old male who presents today to establish care and discuss the problems mentioned below. Will obtain old records.  1) CVA: Admitted on 01/30/16 with CVA (acute right parietal lobe). Found extensive coronary arteriosclerosis and treated with ASA and Plavix (allergic reaction), and then Petal for which he could not tolerate. He reports continued slurring of speech since CVA with some improvement since discharge. He completed a hospitalization follow-up with his PCP in late August.  2) CKD Stage III: Evidence of CKD prior during hospitalization. Recent BMP with creatinine of 1.07 and GFR >60.   3) Rash: Located to posterior trunk and lower extremities that has been present for the past 2 weeks. He reports itching. He denies changes in soaps, laundry detergents, food, medications, and has not been outdoors in the garden or woods. He's not tried anything OTC for his rash. He lives alone and does not have pets. His mattress is fairly new.  4) CAD: Recent right carotid endartectomy patch angiplasty on  02/14/16. CVA (acute right parietal lobe) on 01/30/16 with chronic infarct to right parietal and left cerebellar region. He was initially treated with ASA and Plavix, but Plavix caused rash and was therefore discontinued. He was trialed on Brinlinta, but could not tolerate therefore a carotid endarterectomy was favored over carotid stenting. He was seen by his PCP on 02/11/16 for hospital follow up. Since his surgery he has done quite well. Denies erythema, pain to the surgical site. He is managed on aspirin 325 mg once daily.  Review of Systems  Constitutional: Negative for fever.  Respiratory: Negative for shortness of breath.   Cardiovascular: Negative for chest pain.  Skin: Positive for rash. Negative for wound.  Neurological: Negative for dizziness and weakness.   Gradual improvement in speech       Past Medical History:  Diagnosis Date  . Aortic stenosis    mild AS 01/2016 echo  . Carotid stenosis   . Chicken pox   . Coronary arteriosclerosis due to lipid rich plaque 01/30/2016  . Edema   . Myocardial infarct    1988 and 1990  . Stroke [facility])      Social History   Social History  . Marital status: Married    Spouse name: N/A  . Number of children: 4  . Years of education: 12   Occupational History  . Retired - Optician, dispensing    Social History Main Topics  . Smoking status: Former Smoker    Packs/day: 1.00    Years: 35.00    Quit date: 07/13/1985  . Smokeless tobacco: Never Used  . Alcohol use No  . Drug use: No  . Sexual activity: Not on file   Other Topics Concern  . Not on file   Social History Narrative   Fun: Watching TV.    Denies abuse and feels safe at home.     Past Surgical History:  Procedure Laterality Date  . CARDIAC CATHETERIZATION N/A 02/04/2016   Procedure: Left Heart Cath and Coronary Angiography;  Surgeon: Jolaine Artist, MD;  Location: [place] [facility];  Service: Cardiovascular;  Laterality: N/A;  . CAROTID ENDARTERECTOMY Right 02/14/2016  . CATARACT EXTRACTION    . ENDARTERECTOMY Right 02/14/2016   Procedure: RIGHT CAROTID ENDARTERECTOMY;  Surgeon: [name], MD;  Location: [place];  Service: Vascular;  Laterality: Right;  . PATCH ANGIOPLASTY Right 02/14/2016   Procedure: [place] X2;  Surgeon: [name], MD;  Location: [facility] OR;  Service: Vascular;  Laterality: Right;  . testicle     right hydrocele    Family History  Problem Relation Age of Onset  . Heart attack Mother   . Heart disease Mother   . Bone cancer Father   . Cancer Father     Allergies  Allergen Reactions  . Atorvastatin Rash and Other (See Comments)    Patient started on plavix and lipitor at the same time and developed rash  . Brilinta  [Ticagrelor] Hives  . Plavix [Clopidogrel Bisulfate] Rash and Other (See Comments)    Patient started on plavix and lipitor at the same time and developed rash    Current Outpatient Prescriptions on File Prior to Visit  Medication Sig Dispense Refill  . aspirin EC 325 MG tablet Take 1 tablet (325 mg total) by mouth daily. 30 tablet 0   No current facility-administered medications on file prior to visit.     BP 124/84   Pulse (!) 113   Temp 98 F (36.7 C) (Oral)   Ht 5' (1.524 m)   Wt 153 lb 1.9 oz (69.5 kg)   SpO2 93%   BMI 29.90 kg/m    Objective:   Physical Exam  Constitutional: He is oriented to person, place, and time. He appears well-nourished.  Eyes: Pupils are equal, round, and reactive to light.  Neck: Neck supple.  Surgical site healed properly.  Cardiovascular: Normal rate and regular rhythm.   Pulmonary/Chest: Effort normal and breath sounds normal. He has no wheezes.  Neurological: He is alert and oriented to person, place, and time. No cranial nerve deficit.  Skin: Skin is warm and dry. Rash noted.  Dry skin throughout trunk and upper and lower extremities. Irritative, red rash located to posterior trunk. Rash with evidence of scab secondary to scratching to lower extremities. No hives or evidence of bedbugs/scabies.          Assessment & Plan:

## 2016-03-20 NOTE — Progress Notes (Signed)
Pre visit review using our clinic review tool, if applicable. No additional management support is needed unless otherwise documented below in the visit note. 

## 2016-03-20 NOTE — Assessment & Plan Note (Signed)
Lipid panel in August 2017 acceptable. Not currently managed on statin, possible allergy to atorvastatin during hospitalization. Consider reinitiation once current rash resolves.

## 2016-03-27 ENCOUNTER — Ambulatory Visit: Payer: Medicare HMO | Admitting: Family

## 2016-03-27 ENCOUNTER — Telehealth: Payer: Self-pay | Admitting: Family

## 2016-03-27 NOTE — Telephone Encounter (Signed)
Patient no showed for follow up on 10/12.  Please advise.

## 2016-03-27 NOTE — Telephone Encounter (Signed)
Ok to reschedule if he calls back - he recently switched to [facility].

## 2016-03-28 ENCOUNTER — Ambulatory Visit (INDEPENDENT_AMBULATORY_CARE_PROVIDER_SITE_OTHER): Payer: Medicare HMO | Admitting: Primary Care

## 2016-03-28 ENCOUNTER — Encounter: Payer: Self-pay | Admitting: Primary Care

## 2016-03-28 VITALS — BP 146/98 | HR 83 | Temp 98.0°F | Ht 60.0 in | Wt 156.0 lb

## 2016-03-28 DIAGNOSIS — H6122 Impacted cerumen, left ear: Secondary | ICD-10-CM

## 2016-03-28 DIAGNOSIS — R21 Rash and other nonspecific skin eruption: Secondary | ICD-10-CM

## 2016-03-28 MED ORDER — PREDNISONE 10 MG PO TABS: ORAL_TABLET | ORAL | 0 refills | Status: DC

## 2016-03-28 NOTE — Progress Notes (Signed)
Pre visit review using our clinic review tool, if applicable. No additional management support is needed unless otherwise documented below in the visit note. 

## 2016-03-28 NOTE — Patient Instructions (Signed)
Start prednisone tablets. Take three tablets for 3 days, then two tablets for 3 days, then one tablet for 3 days.  Apply lotion to your entire body everyday.   Start taking a daily antihistamine such as Zyrtec at bedtime.  Call me if no improvement or if rash returns.  It was a pleasure to see you today!  Cerumen Impaction The structures of the external ear canal secrete a waxy substance known as cerumen. Excess cerumen can build up in the ear canal, causing a condition known as cerumen impaction. Cerumen impaction can cause ear pain and disrupt the function of the ear. The rate of cerumen production differs for each individual. In certain individuals, the configuration of the ear canal may decrease his or her ability to naturally remove cerumen. CAUSES Cerumen impaction is caused by excessive cerumen production or buildup. RISK FACTORS  Frequent use of swabs to clean ears.  Having narrow ear canals.  Having eczema.  Being dehydrated. SIGNS AND SYMPTOMS  Diminished hearing.  Ear drainage.  Ear pain.  Ear itch. TREATMENT Treatment may involve:  Over-the-counter or prescription ear drops to soften the cerumen.  Removal of cerumen by a health care provider. This may be done with:  Irrigation with warm water. This is the most common method of removal.  Ear curettes and other instruments.  Surgery. This may be done in severe cases. HOME CARE INSTRUCTIONS  Take medicines only as directed by your health care provider.  Do not insert objects into the ear with the intent of cleaning the ear. PREVENTION  Do not insert objects into the ear, even with the intent of cleaning the ear. Removing cerumen as a part of normal hygiene is not necessary, and the use of swabs in the ear canal is not recommended.  Drink enough water to keep your urine clear or pale yellow.  Control your eczema if you have it. SEEK MEDICAL CARE IF:  You develop ear pain.  You develop bleeding from  the ear.  The cerumen does not clear after you use ear drops as directed.   This information is not intended to replace advice given to you by your health care provider. Make sure you discuss any questions you have with your health care provider.   Document Released: 07/10/2004 Document Revised: 06/23/2014 Document Reviewed: 01/17/2015 Elsevier Interactive Patient Education [employer].

## 2016-03-28 NOTE — Progress Notes (Signed)
Subjective:    Patient ID: David Bradshaw, male    DOB: 12-Aug-1926, 82 y.o.   MRN: [id number]  HPI  [name] is an 82 year old male who presents today with a chief complaint of rash. His rash is located to the bilateral lower extremities and posterior trunk. His rash has been present for the past 3 weeks. He was evaluated on 10/05 and was provided with a prednisone taper for contact dermatitis.  Since his last visit the rash on his lower extremities has improved. He noticed improvement while on the medication, but his rash returned after he completed the course. His rash is itchy. He denies changes in soaps, detergents, foods, medications, or being in brush or grassy fields. He also denies SOB, wheezing, throat tightness.  2) Difficulty Hearing: Located to the left ear for the past several weeks. He's also noticed some dizziness upon waking in the mornings. Denies ear pain, sore throat, fevers, chills, sinus pressure.  Review of Systems  Constitutional: Negative for fever.  HENT: Negative for congestion, ear pain, rhinorrhea and trouble swallowing.        Ear fullness/decreased hearing.  Respiratory: Negative for shortness of breath.   Cardiovascular: Negative for chest pain.  Skin: Positive for rash.  Neurological: Positive for dizziness.       Past Medical History:  Diagnosis Date  . Aortic stenosis    mild AS 01/2016 echo  . Carotid stenosis   . Chicken pox   . Coronary arteriosclerosis due to lipid rich plaque 01/30/2016  . Edema   . Myocardial infarct    1988 and 1990  . Stroke [facility])      Social History   Social History  . Marital status: Married    Spouse name: N/A  . Number of children: 4  . Years of education: 12   Occupational History  . Retired - Optician, dispensing    Social History Main Topics  . Smoking status: Former Smoker    Packs/day: 1.00    Years: 35.00    Quit date: 07/13/1985  . Smokeless tobacco: Never Used  . Alcohol use No  . Drug use: No    . Sexual activity: Not on file   Other Topics Concern  . Not on file   Social History Narrative   Fun: Watching TV.    Denies abuse and feels safe at home.     Past Surgical History:  Procedure Laterality Date  . CARDIAC CATHETERIZATION N/A 02/04/2016   Procedure: Left Heart Cath and Coronary Angiography;  Surgeon: Jolaine Artist, MD;  Location: [place] [facility];  Service: Cardiovascular;  Laterality: N/A;  . CAROTID ENDARTERECTOMY Right 02/14/2016  . CATARACT EXTRACTION    . ENDARTERECTOMY Right 02/14/2016   Procedure: RIGHT CAROTID ENDARTERECTOMY;  Surgeon: [name], MD;  Location: [place];  Service: Vascular;  Laterality: Right;  . PATCH ANGIOPLASTY Right 02/14/2016   Procedure: [place] X2;  Surgeon: [name], MD;  Location: [facility] OR;  Service: Vascular;  Laterality: Right;  . testicle     right hydrocele    Family History  Problem Relation Age of Onset  . Heart attack Mother   . Heart disease Mother   . Bone cancer Father   . Cancer Father     Allergies  Allergen Reactions  . Atorvastatin Rash and Other (See Comments)    Patient started on plavix and lipitor at the same time and developed rash  .  Brilinta [Ticagrelor] Hives  . Plavix [Clopidogrel Bisulfate] Rash and Other (See Comments)    Patient started on plavix and lipitor at the same time and developed rash    Current Outpatient Prescriptions on File Prior to Visit  Medication Sig Dispense Refill  . aspirin EC 325 MG tablet Take 1 tablet (325 mg total) by mouth daily. 30 tablet 0   No current facility-administered medications on file prior to visit.     BP (!) 146/98   Pulse 83   Temp 98 F (36.7 C) (Oral)   Ht 5' (1.524 m)   Wt 156 lb (70.8 kg)   SpO2 97%   BMI 30.47 kg/m    Objective:   Physical Exam  Constitutional: He appears well-nourished.  HENT:  Right Ear: Tympanic membrane and ear canal normal.  Left Ear:  Tympanic membrane and ear canal normal.  Nose: No mucosal edema. Right sinus exhibits no maxillary sinus tenderness and no frontal sinus tenderness. Left sinus exhibits no maxillary sinus tenderness and no frontal sinus tenderness.  Mouth/Throat: Oropharynx is clear and moist.  Moderate/severe cerumen impaction to left canal. Wax removed with instrumentation and irrigation. TM and canals unremarkable post removal.  Eyes: Conjunctivae are normal.  Neck: Neck supple.  Cardiovascular: Normal rate and regular rhythm.   Pulmonary/Chest: Effort normal and breath sounds normal. He has no wheezes. He has no rales.  Skin: Skin is warm and dry. Rash noted.  Small, papular rash to posterior trunk. Improved from last visit. Dry skin to entire body.          Assessment & Plan:  Rash:  Overall improved since last visit. Only on posterior trunk. No wheals. Appears to contact related. Will re-initiate prednisone taper for longer duration. Discussed to use moisturizer daily. Start zyrtec HS. Follow up PRN.  Cerumen Impaction:  Ear fullness with dizziness and decreased hearing to left ear x 2 days. Exam with moderate/severe impaction to left canal. TM unremarkable post removal. Discussed home care instructions for cerumen buildup.  Sheral Flow, NP

## 2016-03-28 NOTE — Telephone Encounter (Signed)
noted 

## 2016-05-12 ENCOUNTER — Ambulatory Visit: Payer: Self-pay | Admitting: Neurology

## 2016-05-20 NOTE — Addendum Note (Signed)
Addended by: Lianne Cure A on: 05/20/2016 09:23 AM   Modules accepted: Orders

## 2016-09-09 ENCOUNTER — Encounter: Payer: Self-pay | Admitting: Family

## 2016-09-12 ENCOUNTER — Encounter (HOSPITAL_COMMUNITY): Payer: Medicare HMO

## 2016-09-12 ENCOUNTER — Ambulatory Visit: Payer: Medicare HMO | Admitting: Family

## 2016-09-19 ENCOUNTER — Ambulatory Visit (HOSPITAL_COMMUNITY)
Admission: RE | Admit: 2016-09-19 | Discharge: 2016-09-19 | Disposition: A | Discharge status: Home / Self Care | Payer: Medicare HMO | Source: Ambulatory Visit | Attending: Vascular Surgery | Admitting: Vascular Surgery

## 2016-09-19 ENCOUNTER — Encounter: Payer: Self-pay | Admitting: Family

## 2016-09-19 ENCOUNTER — Ambulatory Visit (INDEPENDENT_AMBULATORY_CARE_PROVIDER_SITE_OTHER): Payer: Medicare HMO | Admitting: Family

## 2016-09-19 VITALS — BP 150/101 | HR 100 | Temp 97.2°F | Resp 20 | Ht 60.0 in | Wt 162.0 lb

## 2016-09-19 DIAGNOSIS — Z9889 Other specified postprocedural states: Secondary | ICD-10-CM | POA: Diagnosis not present

## 2016-09-19 DIAGNOSIS — I6521 Occlusion and stenosis of right carotid artery: Secondary | ICD-10-CM

## 2016-09-19 DIAGNOSIS — I6523 Occlusion and stenosis of bilateral carotid arteries: Secondary | ICD-10-CM | POA: Insufficient documentation

## 2016-09-19 LAB — VAS US CAROTID
LEFT ECA DIAS: -14 cm/s
Left CCA dist dias: 19 cm/s
Left CCA dist sys: 61 cm/s
Left CCA prox dias: 20 cm/s
Left CCA prox sys: 67 cm/s
Left ICA dist dias: 28 cm/s
Left ICA dist sys: 75 cm/s
Left ICA prox dias: 34 cm/s
Left ICA prox sys: 83 cm/s
RIGHT CCA MID DIAS: 8 cm/s
RIGHT ECA DIAS: -7 cm/s
Right CCA prox dias: 6 cm/s
Right CCA prox sys: 38 cm/s
Right cca dist sys: 36 cm/s

## 2016-09-19 NOTE — Progress Notes (Signed)
Chief Complaint: Follow up Extracranial Carotid Artery Stenosis   History of Present Illness  David Bradshaw is a 83 y.o. male who is s/p right carotid endarterectomy with bovine pericardial patch angioplasty x2 on 02-14-16 by Dr. Donzetta Matters.   He had a preoperative right parietal stroke, and found to have significant stenosis of his right internal carotid artery. He was initially planned for right carotid stent but due to allergy to Plavix he was scheduled for carotid endarterectomy.  The lesion extended very high on the right internal carotid artery above the level of the hypoglossal. The digastric muscle was divided as well as the occipital branch of the external carotid artery. He did have significant back pressure of 80 mmHg and did not necessitate a shunt. After initial bovine pericardial patch angioplasty Doppler demonstrated lack of flow in the internal carotid artery and a redo patch at a higher level was performed. Completion Doppler demonstrated selected signals with flow throughout diastole in the internal carotid and on duplex demonstrated flow as well with grayscale demonstrating no significant particular matter.  He returns today for 6 months follow up. Dr. Donzetta Matters last evaluated pt on 03-07-16. At that time he had no symptoms with normal swallowing; pt still complained of hoarseness and had some mild tongue deviation that was improving from preoperatively. Dr. Donzetta Matters advised to continue ASA.   Son states pt had a TIA preoperatively as manifested by left arm numbness and weakness, denies left leg symptoms, pt and son deny any vision or speech changes at that time. Pt and son deny that pt had any subsequent TIA or stroke.  Son states that pt's hoarseness has mostly resolved.   Pt denies any pain or weakness in his calves or thighs with walking.   Pt Diabetic: no Pt smoker: former smoker, quit in 1987, smoked x 35 years   Pt meds include: Statin : no, had a rash reaction to atorvastatin   ASA: yes Other anticoagulants/antiplatelets: no   Past Medical History:  Diagnosis Date  . Aortic stenosis    mild AS 01/2016 echo  . Carotid stenosis   . Chicken pox   . Coronary arteriosclerosis due to lipid rich plaque 01/30/2016  . Edema   . Myocardial infarct    1988 and 1990  . Stroke [facility])     Social History Social History  Substance Use Topics  . Smoking status: Former Smoker    Packs/day: 1.00    Years: 35.00    Quit date: 07/13/1985  . Smokeless tobacco: Never Used  . Alcohol use No    Family History Family History  Problem Relation Age of Onset  . Heart attack Mother   . Heart disease Mother   . Bone cancer Father   . Cancer Father     Surgical History Past Surgical History:  Procedure Laterality Date  . CARDIAC CATHETERIZATION N/A 02/04/2016   Procedure: Left Heart Cath and Coronary Angiography;  Surgeon: Jolaine Artist, MD;  Location: [place] [facility];  Service: Cardiovascular;  Laterality: N/A;  . CAROTID ENDARTERECTOMY Right 02/14/2016  . CATARACT EXTRACTION    . ENDARTERECTOMY Right 02/14/2016   Procedure: RIGHT CAROTID ENDARTERECTOMY;  Surgeon: [name], MD;  Location: [place];  Service: Vascular;  Laterality: Right;  . PATCH ANGIOPLASTY Right 02/14/2016   Procedure: PATCH ANGIOPLASTY USING [place] X2;  Surgeon: [name], MD;  Location: [place];  Service: Vascular;  Laterality: Right;  . testicle  right hydrocele    Allergies  Allergen Reactions  . Atorvastatin Rash and Other (See Comments)    Patient started on plavix and lipitor at the same time and developed rash  . Brilinta [Ticagrelor] Hives  . Plavix [Clopidogrel Bisulfate] Rash and Other (See Comments)    Patient started on plavix and lipitor at the same time and developed rash    Current Outpatient Prescriptions  Medication Sig Dispense Refill  . aspirin EC 325 MG tablet Take 1 tablet (325 mg total) by mouth daily. 30  tablet 0   No current facility-administered medications for this visit.     Review of Systems : See HPI for pertinent positives and negatives.  Physical Examination  Vitals:   09/19/16 1242 09/19/16 1243  BP: (!) 151/97 (!) 150/101  Pulse: 100   Resp: 20   Temp: 97.2 F (36.2 C)   TempSrc: Oral   SpO2: 92%   Weight: 162 lb (73.5 kg)   Height: 5' (1.524 m)    Body mass index is 31.64 kg/m.  General: WDWN short, obese, elderly male in NAD GAIT: normal Eyes: PERRLA Pulmonary:  Respirations are non-labored, good air movement, CTAB  Cardiac: regular rhythm, no detected murmur.  VASCULAR EXAM Carotid Bruits Right Left   Negative Negative    Aorta is not palpable. Radial pulses are 2+ palpable and equal.                                                                                                                            LE Pulses Right Left       POPLITEAL  not palpable   not palpable       POSTERIOR TIBIAL  not palpable   not palpable        DORSALIS PEDIS      ANTERIOR TIBIAL not palpable  not palpable     Gastrointestinal: soft, nontender, BS WNL, no r/g, no palpable masses.  Musculoskeletal: No muscle atrophy/wasting. M/S 5/5 throughout, extremities without ischemic changes.  Neurologic: A&O X 3; Appropriate Affect, Speech is normal CN 2-12 intact except has mild tongue deviation to the right and is hard of hearing, pain and light touch intact in extremities, Motor exam as listed above.     Assessment: David Bradshaw is a 83 y.o. male who is s/p right carotid endarterectomy with bovine pericardial patch angioplasty x2 on 02-14-16 by Dr. Donzetta Matters.   He had a preoperative right parietal stroke, no stroke or TIA subsequently.   His atherosclerotic risk factors include 35 years hx of smoking (quit in 1987), CAD, obesity, and advanced age.   Son states pt blood pressure is usually low, is elevated now; states he will ask a friend who is a nurse to check his  blood pressure this afternoon, and if still elevated, will call pt's PCP or cardiologist office.   Dr. Donzetta Matters reviewed the results of today's carotid duplex, spoke with pt and son, and examined pt.  DATA Today's carotid duplex suggests right ICA (CEA site) with 40-59% stenosis (high end of range), and <40% left ICA stenosis. Right vertebral artery appears occluded. Right subclavian artery waveform is monophasic. Left vertebral artery flow is antegrade; left subclavian artery waveform is normal.  No previous carotid duplex result for comparison.  Preoperative CTA neck on 01-31-16 shows  65-70% stenosis RIGHT internal carotid artery, and less than 50% stenosis LEFT internal carotid artery.    01-31-16 CTA head and neck: RIGHT CAROTID SYSTEM: Common carotid artery is widely patent, coursing in a straight line fashion. Eccentric intimal thickening and to lesser extent calcific atherosclerosis results in 9 mm segment 65-70% stenosis RIGHT internal carotid artery within 1 cm of the origin.  LEFT CAROTID SYSTEM: Common carotid artery is widely patent, coursing in a straight line fashion. Normal appearance of the carotid bifurcation without hemodynamically significant stenosis by NASCET criteria. Eccentric intimal thickening results in less than 50% stenosis LEFT internal carotid artery origin.  VERTEBRAL ARTERIES:Codominant vertebral artery's. Normal appearance of the vertebral arteries, which appear widely patent. Mild extrinsic deformity due to degenerative cervical spine.  CTA HEAD: No emergent large vessel occlusion or high-grade stenosis.  Approximately 50% stenosis RIGHT anterior genu of the internal carotid artery. Moderate stenosis RIGHT P1 origin with complete circle of Willis.    Plan: Follow-up in 6 months with Carotid Duplex scan.   I discussed in depth with the patient the nature of atherosclerosis, and emphasized the importance of maximal medical management including  strict control of blood pressure, blood glucose, and lipid levels, obtaining regular exercise, and continued cessation of smoking.  The patient is aware that without maximal medical management the underlying atherosclerotic disease process will progress, limiting the benefit of any interventions. The patient was given information about stroke prevention and what symptoms should prompt the patient to seek immediate medical care. Thank you for allowing [place] to participate in this patient's care.  Clemon Chambers, RN, MSN, FNP-C [facility] Office: [phone]  Clinic Physician: Donzetta Matters  09/19/16 12:47 PM

## 2016-09-19 NOTE — Patient Instructions (Signed)
Stroke Prevention Some medical conditions and behaviors are associated with an increased chance of having a stroke. You may prevent a stroke by making healthy choices and managing medical conditions. How can I reduce my risk of having a stroke?  Stay physically active. Get at least 30 minutes of activity on most or all days.  Do not smoke. It may also be helpful to avoid exposure to secondhand smoke.  Limit alcohol use. Moderate alcohol use is considered to be:  No more than 2 drinks per day for men.  No more than 1 drink per day for nonpregnant women.  Eat healthy foods. This involves:  Eating 5 or more servings of fruits and vegetables a day.  Making dietary changes that address high blood pressure (hypertension), high cholesterol, diabetes, or obesity.  Manage your cholesterol levels.  Making food choices that are high in fiber and low in saturated fat, trans fat, and cholesterol may control cholesterol levels.  Take any prescribed medicines to control cholesterol as directed by your health care provider.  Manage your diabetes.  Controlling your carbohydrate and sugar intake is recommended to manage diabetes.  Take any prescribed medicines to control diabetes as directed by your health care provider.  Control your hypertension.  Making food choices that are low in salt (sodium), saturated fat, trans fat, and cholesterol is recommended to manage hypertension.  Ask your health care provider if you need treatment to lower your blood pressure. Take any prescribed medicines to control hypertension as directed by your health care provider.  If you are 18-39 years of age, have your blood pressure checked every 3-5 years. If you are 40 years of age or older, have your blood pressure checked every year.  Maintain a healthy weight.  Reducing calorie intake and making food choices that are low in sodium, saturated fat, trans fat, and cholesterol are recommended to manage  weight.  Stop drug abuse.  Avoid taking birth control pills.  Talk to your health care provider about the risks of taking birth control pills if you are over 35 years old, smoke, get migraines, or have ever had a blood clot.  Get evaluated for sleep disorders (sleep apnea).  Talk to your health care provider about getting a sleep evaluation if you snore a lot or have excessive sleepiness.  Take medicines only as directed by your health care provider.  For some people, aspirin or blood thinners (anticoagulants) are helpful in reducing the risk of forming abnormal blood clots that can lead to stroke. If you have the irregular heart rhythm of atrial fibrillation, you should be on a blood thinner unless there is a good reason you cannot take them.  Understand all your medicine instructions.  Make sure that other conditions (such as anemia or atherosclerosis) are addressed. Get help right away if:  You have sudden weakness or numbness of the face, arm, or leg, especially on one side of the body.  Your face or eyelid droops to one side.  You have sudden confusion.  You have trouble speaking (aphasia) or understanding.  You have sudden trouble seeing in one or both eyes.  You have sudden trouble walking.  You have dizziness.  You have a loss of balance or coordination.  You have a sudden, severe headache with no known cause.  You have new chest pain or an irregular heartbeat. Any of these symptoms may represent a serious problem that is an emergency. Do not wait to see if the symptoms will go away.   Get medical help at once. Call your local emergency services (911 in [place] Do not drive yourself to the hospital. This information is not intended to replace advice given to you by your health care provider. Make sure you discuss any questions you have with your health care provider. Document Released: 07/10/2004 Document Revised: 11/08/2015 Document Reviewed: 12/03/2012 Elsevier  Interactive Patient Education  2017 Elsevier Inc.      Preventing Cerebrovascular Disease Arteries are blood vessels that carry blood that contains oxygen from the heart to all parts of the body. Cerebrovascular disease affects arteries that supply the brain. Any condition that blocks or disrupts blood flow to the brain can cause cerebrovascular disease. Brain cells that lose blood supply start to die within minutes (stroke). Stroke is the main danger of cerebrovascular disease. Atherosclerosis and high blood pressure are common causes of cerebrovascular disease. Atherosclerosis is narrowing and hardening of an artery that results when fat, cholesterol, calcium, or other substances (plaque) build up inside an artery. Plaque reduces blood flow through the artery. High blood pressure increases the risk of bleeding inside the brain. Making diet and lifestyle changes to prevent atherosclerosis and high blood pressure lowers your risk of cerebrovascular disease. What nutrition changes can be made?  Eat more fruits, vegetables, and whole grains.  Reduce how much saturated fat you eat. To do this, eat less red meat and fewer full-fat dairy products.  Eat healthy proteins instead of red meat. Healthy proteins include:  Fish. Eat fish that contains heart-healthy omega-3 fatty acids, twice a week. Examples include salmon, albacore tuna, mackerel, and herring.  Chicken.  Nuts.  Low-fat or nonfat yogurt.  Avoid processed meats, like bacon and lunchmeat.  Avoid foods that contain:  A lot of sugar, such as sweets and drinks with added sugar.  A lot of salt (sodium). Avoid adding extra salt to your food, as told by your health care provider.  Trans fats, such as margarine and baked goods. Trans fats may be listed as "partially hydrogenated oils" on food labels.  Check food labels to see how much sodium, sugar, and trans fats are in foods.  Use vegetable oils that contain low amounts of  saturated fat, such as olive oil or canola oil. What lifestyle changes can be made?  Drink alcohol in moderation. This means no more than 1 drink a day for nonpregnant women and 2 drinks a day for men. One drink equals 12 oz of beer, 5 oz of wine, or 1 oz of hard liquor.  If you are overweight, ask your health care provider to recommend a weight-loss plan for you. Losing 5-10 lb (2.2-4.5 kg) can reduce your risk of diabetes, atherosclerosis, and high blood pressure.  Exercise for 30?60 minutes on most days, or as much as told by your health care provider.  Do moderate-intensity exercise, such as brisk walking, bicycling, and water aerobics. Ask your health care provider which activities are safe for you.  Do not use any products that contain nicotine or tobacco, such as cigarettes and e-cigarettes. If you need help quitting, ask your health care provider. Why are these changes important? Making these changes lowers your risk of many diseases that can cause cerebrovascular disease and stroke. Stroke is a leading cause of death and disability. Making these changes also improves your overall health and quality of life. What can I do to lower my risk? The following factors make you more likely to develop cerebrovascular disease:  Being overweight.  Smoking.  Being physically   inactive.  Eating a high-fat diet.  Having certain health conditions, such as:  Diabetes.  High blood pressure.  Heart disease.  Atherosclerosis.  High cholesterol.  Sickle cell disease. Talk with your health care provider about your risk for cerebrovascular disease. Work with your health care provider to control diseases that you have that may contribute to cerebrovascular disease. Your health care provider may prescribe medicines to help prevent major causes of cerebrovascular disease. Where to find more information: Learn more about preventing cerebrovascular disease from:  National Heart, Lung, and  Blood Institute: www.nhlbi.nih.gov/health/health-topics/topics/stroke  Centers for Disease Control and Prevention: cdc.gov/stroke/about.htm Summary  Cerebrovascular disease can lead to a stroke.  Atherosclerosis and high blood pressure are major causes of cerebrovascular disease.  Making diet and lifestyle changes can reduce your risk of cerebrovascular disease.  Work with your health care provider to get your risk factors under control to reduce your risk of cerebrovascular disease. This information is not intended to replace advice given to you by your health care provider. Make sure you discuss any questions you have with your health care provider. Document Released: 06/17/2015 Document Revised: 12/21/2015 Document Reviewed: 06/17/2015 Elsevier Interactive Patient Education  2017 Elsevier Inc.  

## 2016-09-22 NOTE — Addendum Note (Signed)
Addended by: Lianne Cure A on: 09/22/2016 09:17 AM   Modules accepted: Orders

## 2016-11-03 DIAGNOSIS — H5203 Hypermetropia, bilateral: Secondary | ICD-10-CM | POA: Diagnosis not present

## 2016-11-12 DIAGNOSIS — H353132 Nonexudative age-related macular degeneration, bilateral, intermediate dry stage: Secondary | ICD-10-CM | POA: Diagnosis not present

## 2017-03-25 ENCOUNTER — Ambulatory Visit: Payer: Medicare HMO | Admitting: Family

## 2017-03-25 ENCOUNTER — Encounter (HOSPITAL_COMMUNITY): Payer: Medicare HMO

## 2017-11-13 DIAGNOSIS — H353132 Nonexudative age-related macular degeneration, bilateral, intermediate dry stage: Secondary | ICD-10-CM | POA: Diagnosis not present

## 2017-11-13 DIAGNOSIS — H43811 Vitreous degeneration, right eye: Secondary | ICD-10-CM | POA: Diagnosis not present

## 2018-01-05 DIAGNOSIS — Z825 Family history of asthma and other chronic lower respiratory diseases: Secondary | ICD-10-CM | POA: Diagnosis not present

## 2018-01-05 DIAGNOSIS — Z6831 Body mass index (BMI) 31.0-31.9, adult: Secondary | ICD-10-CM | POA: Diagnosis not present

## 2018-01-05 DIAGNOSIS — Z7982 Long term (current) use of aspirin: Secondary | ICD-10-CM | POA: Diagnosis not present

## 2018-01-05 DIAGNOSIS — Z809 Family history of malignant neoplasm, unspecified: Secondary | ICD-10-CM | POA: Diagnosis not present

## 2018-01-05 DIAGNOSIS — E669 Obesity, unspecified: Secondary | ICD-10-CM | POA: Diagnosis not present

## 2018-01-05 DIAGNOSIS — I252 Old myocardial infarction: Secondary | ICD-10-CM | POA: Diagnosis not present

## 2018-01-05 DIAGNOSIS — Z87891 Personal history of nicotine dependence: Secondary | ICD-10-CM | POA: Diagnosis not present

## 2018-01-05 DIAGNOSIS — R030 Elevated blood-pressure reading, without diagnosis of hypertension: Secondary | ICD-10-CM | POA: Diagnosis not present

## 2018-01-05 DIAGNOSIS — Z8673 Personal history of transient ischemic attack (TIA), and cerebral infarction without residual deficits: Secondary | ICD-10-CM | POA: Diagnosis not present

## 2018-09-07 DIAGNOSIS — R3911 Hesitancy of micturition: Secondary | ICD-10-CM | POA: Diagnosis not present

## 2018-09-07 DIAGNOSIS — N433 Hydrocele, unspecified: Secondary | ICD-10-CM | POA: Diagnosis not present

## 2020-05-30 ENCOUNTER — Telehealth: Payer: Self-pay | Admitting: Primary Care

## 2020-05-30 NOTE — Telephone Encounter (Signed)
Yes, I am more than willing to see patient for re-establish care and for his acute concerns, all in once visit is fine.

## 2020-05-30 NOTE — Telephone Encounter (Signed)
Patient hasn't been here since 2017. Patient is wanting to re establish with you. Patient is not feeling well and would like to be seen as an acute visit. Would you like to re establish and see him as an acute visit? EM Patient fell last week and is feeling really weak. His face is really yellow in color. Son concerned with kidney function.

## 2020-06-01 ENCOUNTER — Other Ambulatory Visit: Payer: Self-pay | Admitting: Primary Care

## 2020-06-01 ENCOUNTER — Other Ambulatory Visit: Payer: Self-pay

## 2020-06-01 ENCOUNTER — Ambulatory Visit (INDEPENDENT_AMBULATORY_CARE_PROVIDER_SITE_OTHER): Payer: Medicare HMO | Admitting: Primary Care

## 2020-06-01 ENCOUNTER — Encounter: Payer: Self-pay | Admitting: Primary Care

## 2020-06-01 ENCOUNTER — Other Ambulatory Visit (INDEPENDENT_AMBULATORY_CARE_PROVIDER_SITE_OTHER): Payer: Medicare HMO

## 2020-06-01 ENCOUNTER — Telehealth: Payer: Self-pay | Admitting: Radiology

## 2020-06-01 VITALS — BP 98/64 | HR 115 | Temp 98.0°F | Ht 60.0 in | Wt 153.8 lb

## 2020-06-01 DIAGNOSIS — R5383 Other fatigue: Secondary | ICD-10-CM | POA: Diagnosis not present

## 2020-06-01 DIAGNOSIS — E785 Hyperlipidemia, unspecified: Secondary | ICD-10-CM | POA: Diagnosis not present

## 2020-06-01 DIAGNOSIS — R17 Unspecified jaundice: Secondary | ICD-10-CM

## 2020-06-01 DIAGNOSIS — Z8673 Personal history of transient ischemic attack (TIA), and cerebral infarction without residual deficits: Secondary | ICD-10-CM | POA: Diagnosis not present

## 2020-06-01 DIAGNOSIS — N183 Chronic kidney disease, stage 3 unspecified: Secondary | ICD-10-CM

## 2020-06-01 DIAGNOSIS — R600 Localized edema: Secondary | ICD-10-CM

## 2020-06-01 LAB — COMPREHENSIVE METABOLIC PANEL
ALT: 170 U/L — ABNORMAL HIGH (ref 0–53)
AST: 191 U/L — ABNORMAL HIGH (ref 0–37)
Albumin: 3 g/dL — ABNORMAL LOW (ref 3.5–5.2)
Alkaline Phosphatase: 689 U/L — ABNORMAL HIGH (ref 39–117)
BUN: 28 mg/dL — ABNORMAL HIGH (ref 6–23)
CO2: 25 mEq/L (ref 19–32)
Calcium: 8.6 mg/dL (ref 8.4–10.5)
Chloride: 105 mEq/L (ref 96–112)
Creatinine, Ser: 1.33 mg/dL (ref 0.40–1.50)
GFR: 46.87 mL/min — ABNORMAL LOW (ref >60.00)
Glucose, Bld: 99 mg/dL (ref 70–99)
Potassium: 4.1 mEq/L (ref 3.5–5.1)
Sodium: 141 mEq/L (ref 135–145)
Total Bilirubin: 16.7 mg/dL — ABNORMAL HIGH (ref 0.2–1.2)
Total Protein: 6.2 g/dL (ref 6.0–8.3)

## 2020-06-01 LAB — LIPID PANEL
Cholesterol: 150 mg/dL (ref 0–200)
HDL: 6.6 mg/dL — ABNORMAL LOW (ref >39.00)
NonHDL: 143.25
Total CHOL/HDL Ratio: 23
Triglycerides: 252 mg/dL — ABNORMAL HIGH (ref 0.0–149.0)
VLDL: 50.4 mg/dL — ABNORMAL HIGH (ref 0.0–40.0)

## 2020-06-01 LAB — CBC
HCT: 47.1 % (ref 39.0–52.0)
Hemoglobin: 15.8 g/dL (ref 13.0–17.0)
MCHC: 33.6 g/dL (ref 30.0–36.0)
MCV: 95.8 fl (ref 78.0–100.0)
Platelets: 307 10*3/uL (ref 150.0–400.0)
RBC: 4.92 Mil/uL (ref 4.22–5.81)
RDW: 18 % — ABNORMAL HIGH (ref 11.5–15.5)
WBC: 18.4 10*3/uL (ref 4.0–10.5)

## 2020-06-01 LAB — HEMOGLOBIN A1C: Hgb A1c MFr Bld: 4.8 % (ref 4.6–6.5)

## 2020-06-01 LAB — WHITE CELL DIFFERENTIAL
Basophils Relative: 1.2 % (ref 0.0–3.0)
Eosinophils Relative: 1.3 % (ref 0.0–5.0)
Lymphocytes Relative: 22.9 % (ref 12.0–46.0)
Monocytes Relative: 5.5 % (ref 3.0–12.0)
Neutrophils Relative %: 69.1 % (ref 43.0–77.0)

## 2020-06-01 LAB — TSH: TSH: 3.88 u[IU]/mL (ref 0.35–4.50)

## 2020-06-01 LAB — LDL CHOLESTEROL, DIRECT: Direct LDL: 102 mg/dL

## 2020-06-01 NOTE — Assessment & Plan Note (Signed)
Evident on exam today as noted. This has likely been ongoing, recently noticed by a family member that doesn't see him often.  Checking liver enzymes, hepatitis panel, liver ultrasound, CBC, and other labs.  He appears well, is in no distress.

## 2020-06-01 NOTE — Assessment & Plan Note (Signed)
No symptoms since. Compliant to aspirin 325 mg daily.

## 2020-06-01 NOTE — Telephone Encounter (Signed)
Noted, verbal orders provided to add diff to CBC. Awaiting other lab results.

## 2020-06-01 NOTE — Assessment & Plan Note (Signed)
Noted on exam today, mild with trace pitting. Appears stable, no congestion noted to lungs. Labs pending.

## 2020-06-01 NOTE — Telephone Encounter (Signed)
[facility] called a critical result, WBC 18.4, results to David Bradshaw

## 2020-06-01 NOTE — Progress Notes (Signed)
Subjective:    Patient ID: David Bradshaw, male    DOB: [birth year], 86 y.o.   MRN: [id number]  HPI  This visit occurred during the SARS-CoV-2 public health emergency.  Safety protocols were in place, including screening questions prior to the visit, additional usage of staff PPE, and extensive cleaning of exam room while observing appropriate contact time as indicated for disinfecting solutions.   David Bradshaw is a 86 year old male patient with a history of TIA, CVA, carotid artery stenosis, CKD, hyperlipidemia who presents today to re-establish care and discuss the issues below. He has not been seen by me since 2017.  1) Jaundice: His family has noticed a "yellow" color to his face and neck occurring a few weeks ago. The patient hasn't really noticed a change but has macular degeneration. Over the last month he's noticed some fatigue with his day to day routine. He's still able to take care of his household chores, bathes himself, feeds himself. His family visits him three times weekly. He lives alone.  He had a "stomach virus" around Thanksgiving, one day of 2-3 episodes of diarrhea, generalized abdominal pain which resolved shortly after. He has never consumed alcohol.   He denies abdominal pain, nausea, bloody stools, decrease in appetite. He is drinking water, also soda and milk. His family helps him prepare meals.   2) Fall: Two to three weeks ago he was walking at home, legs "gave out", and he fell forward hitting his knees. He denies injury, hitting his head. He couldn't get up for about 10 minutes. He does live alone. He denies other falls, this was the first one.   He denies headaches, dizziness. He has noticed fatigue for about one month, is able to carry on with his household chores. He does not use a walker or cane.   3) History of CVA: Occurring in 2017 secondary to carotid artery stenosis. History of right carotid endarterectomy in 2017. Could not tolerate Brilinta or Plavix at  the time, he is compliant to aspirin 325 mg daily.   He denies chest pain, dizziness, unilateral weakness. His family denies acute confusion, other changes.   Wt Readings from Last 3 Encounters:  06/01/20 153 lb 12.8 oz (69.8 kg)  09/19/16 162 lb (73.5 kg)  03/28/16 156 lb (70.8 kg)    BP Readings from Last 3 Encounters:  06/01/20 98/64  09/19/16 (!) 150/101  03/28/16 (!) 146/98     Review of Systems  Constitutional: Positive for fatigue.  Eyes: Positive for visual disturbance.       Chronic macular degeneration  Respiratory: Negative for shortness of breath.   Cardiovascular: Negative for chest pain.  Gastrointestinal: Negative for abdominal pain, blood in stool, diarrhea, nausea and vomiting.  Endocrine: Negative for polyuria.  Genitourinary: Negative for dysuria.  Skin: Positive for color change.  Neurological: Negative for dizziness, numbness and headaches.  Hematological: Negative for adenopathy.  Psychiatric/Behavioral: The patient is not nervous/anxious.        Past Medical History:  Diagnosis Date  . Aortic stenosis    mild AS 01/2016 echo  . Carotid stenosis   . Chicken pox   . Coronary arteriosclerosis due to lipid rich plaque 01/30/2016  . Edema   . Myocardial infarct ([place])    1988 and 1990  . Stroke [facility])      Social History   Socioeconomic History  . Marital status: Married    Spouse name: Not on file  . Number of children: 4  .  Years of education: 30  . Highest education level: Not on file  Occupational History  . Occupation: Retired - Optician, dispensing  Tobacco Use  . Smoking status: Former Smoker    Packs/day: 1.00    Years: 35.00    Pack years: 35.00    Quit date: 07/13/1985    Years since quitting: 34.9  . Smokeless tobacco: Never Used  Substance and Sexual Activity  . Alcohol use: No  . Drug use: No  . Sexual activity: Not on file  Other Topics Concern  . Not on file  Social History Narrative   Fun: Watching TV.    Denies abuse  and feels safe at home.    Social Determinants of Health   Financial Resource Strain: Not on file  Food Insecurity: Not on file  Transportation Needs: Not on file  Physical Activity: Not on file  Stress: Not on file  Social Connections: Not on file  Intimate Partner Violence: Not on file    Past Surgical History:  Procedure Laterality Date  . CARDIAC CATHETERIZATION N/A 02/04/2016   Procedure: Left Heart Cath and Coronary Angiography;  Surgeon: Jolaine Artist, MD;  Location: [facility];  Service: Cardiovascular;  Laterality: N/A;  . CAROTID ENDARTERECTOMY Right 02/14/2016  . CATARACT EXTRACTION    . ENDARTERECTOMY Right 02/14/2016   Procedure: RIGHT CAROTID ENDARTERECTOMY;  Surgeon: [name], MD;  Location: [facility];  Service: Vascular;  Laterality: Right;  . PATCH ANGIOPLASTY Right 02/14/2016   Procedure: [place] X2;  Surgeon: [name], MD;  Location[facility];  Service: Vascular;  Laterality: Right;  . testicle     right hydrocele    Family History  Problem Relation Age of Onset  . Heart attack Mother   . Heart disease Mother   . Bone cancer Father   . Cancer Father     Allergies  Allergen Reactions  . Atorvastatin Rash and Other (See Comments)    Patient started on plavix and lipitor at the same time and developed rash  . Brilinta [Ticagrelor] Hives  . Plavix [Clopidogrel Bisulfate] Rash and Other (See Comments)    Patient started on plavix and lipitor at the same time and developed rash    Current Outpatient Medications on File Prior to Visit  Medication Sig Dispense Refill  . aspirin EC 325 MG tablet Take 1 tablet (325 mg total) by mouth daily. 30 tablet 0   No current facility-administered medications on file prior to visit.    BP 98/64   Pulse (!) 115   Temp 98 F (36.7 C) (Temporal)   Ht 5' (1.524 m)   Wt 153 lb 12.8 oz (69.8 kg)   SpO2 95%   BMI 30.04 kg/m     Objective:   Physical Exam Constitutional:      Appearance: He is well-nourished.  HENT:     Head: Normocephalic.  Eyes:     Conjunctiva/sclera: Conjunctivae normal.  Cardiovascular:     Rate and Rhythm: Regular rhythm. Tachycardia present.     Comments: Trace pitting noted to bilateral lower extremities. Pulmonary:     Effort: Pulmonary effort is normal.     Breath sounds: Normal breath sounds.  Abdominal:     General: Abdomen is flat.     Palpations: Abdomen is soft. There is no mass.     Tenderness: There is no abdominal tenderness.  Musculoskeletal:     Cervical back: Neck supple.  Skin:  General: Skin is warm and dry.     Comments: Jaundice noted to face, neck, anterior and posterior trunk, upper portion of bilateral upper extremities.   Neurological:     Mental Status: He is alert and oriented to person, place, and time.  Psychiatric:        Mood and Affect: Mood and affect and mood normal.            Assessment & Plan:

## 2020-06-01 NOTE — Assessment & Plan Note (Signed)
Repeat labs pending

## 2020-06-01 NOTE — Assessment & Plan Note (Addendum)
Acute for the last month. Exam today consistent with jaundice. Work up in place for liver evaluation and other metabolic causes.    ECG today with sinus tachycardia, RBBB. Appears slightly different from ECG in 2017. Will consult with cardiology.

## 2020-06-01 NOTE — Patient Instructions (Addendum)
Stop by the lab prior to leaving today. I will notify you of your results once received.   Stop by the front desk and speak with either Ashtyn or Anastasiya regarding your liver ultrasound.  It was a pleasure to see you today!

## 2020-06-01 NOTE — Assessment & Plan Note (Signed)
Repeat lipid panel pending. 

## 2020-06-02 DIAGNOSIS — Z87891 Personal history of nicotine dependence: Secondary | ICD-10-CM

## 2020-06-02 DIAGNOSIS — R5383 Other fatigue: Secondary | ICD-10-CM

## 2020-06-02 DIAGNOSIS — R748 Abnormal levels of other serum enzymes: Secondary | ICD-10-CM

## 2020-06-02 DIAGNOSIS — I714 Abdominal aortic aneurysm, without rupture, unspecified: Secondary | ICD-10-CM

## 2020-06-02 DIAGNOSIS — R17 Unspecified jaundice: Secondary | ICD-10-CM

## 2020-06-04 ENCOUNTER — Other Ambulatory Visit: Payer: Self-pay

## 2020-06-04 ENCOUNTER — Ambulatory Visit
Admission: RE | Admit: 2020-06-04 | Discharge: 2020-06-04 | Disposition: A | Discharge status: Home / Self Care | Payer: Medicare HMO | Source: Ambulatory Visit | Attending: Primary Care | Admitting: Primary Care

## 2020-06-04 DIAGNOSIS — R17 Unspecified jaundice: Secondary | ICD-10-CM | POA: Diagnosis present

## 2020-06-04 DIAGNOSIS — R5383 Other fatigue: Secondary | ICD-10-CM | POA: Insufficient documentation

## 2020-06-04 LAB — HEPATITIS PANEL, ACUTE
Hep A IgM: NONREACTIVE
Hep B C IgM: NONREACTIVE
Hepatitis B Surface Ag: NONREACTIVE
Hepatitis C Ab: NONREACTIVE
SIGNAL TO CUT-OFF: 0.11 (ref <1.00)

## 2020-06-04 MED ORDER — IOHEXOL 300 MG/ML  SOLN
100.0000 mL | Freq: Once | INTRAMUSCULAR | Status: AC | PRN
  Administered 2020-06-04: 100 mL via INTRAVENOUS

## 2020-06-05 ENCOUNTER — Ambulatory Visit: Payer: Medicare HMO

## 2020-06-05 ENCOUNTER — Telehealth: Payer: Self-pay

## 2020-06-05 NOTE — Telephone Encounter (Signed)
Left message for [name], patient's son, to call me back to discuss options for GI offices and appointments available. Alma Friendly is aware of this also.

## 2020-06-06 ENCOUNTER — Ambulatory Visit (INDEPENDENT_AMBULATORY_CARE_PROVIDER_SITE_OTHER)
Admission: RE | Admit: 2020-06-06 | Discharge: 2020-06-06 | Disposition: A | Discharge status: Home / Self Care | Payer: Medicare HMO | Source: Ambulatory Visit | Attending: Primary Care | Admitting: Primary Care

## 2020-06-06 DIAGNOSIS — R5383 Other fatigue: Secondary | ICD-10-CM | POA: Diagnosis not present

## 2020-06-06 DIAGNOSIS — Z87891 Personal history of nicotine dependence: Secondary | ICD-10-CM

## 2020-06-06 NOTE — Telephone Encounter (Signed)
Discussed options with [name], see referral notes.

## 2020-06-22 ENCOUNTER — Ambulatory Visit: Payer: Medicare HMO | Admitting: Nurse Practitioner

## 2020-06-22 ENCOUNTER — Encounter: Payer: Self-pay | Admitting: Nurse Practitioner

## 2020-06-22 ENCOUNTER — Other Ambulatory Visit (INDEPENDENT_AMBULATORY_CARE_PROVIDER_SITE_OTHER): Payer: Medicare HMO

## 2020-06-22 VITALS — HR 96 | Ht 60.0 in | Wt 158.0 lb

## 2020-06-22 DIAGNOSIS — R7989 Other specified abnormal findings of blood chemistry: Secondary | ICD-10-CM | POA: Diagnosis not present

## 2020-06-22 DIAGNOSIS — D72829 Elevated white blood cell count, unspecified: Secondary | ICD-10-CM

## 2020-06-22 LAB — CBC WITH DIFFERENTIAL/PLATELET
Basophils Absolute: 0.1 10*3/uL (ref 0.0–0.1)
Basophils Relative: 1.3 % (ref 0.0–3.0)
Eosinophils Absolute: 0.3 10*3/uL (ref 0.0–0.7)
Eosinophils Relative: 3.1 % (ref 0.0–5.0)
HCT: 47.9 % (ref 39.0–52.0)
Hemoglobin: 15.9 g/dL (ref 13.0–17.0)
Lymphocytes Relative: 22.8 % (ref 12.0–46.0)
Lymphs Abs: 2.2 10*3/uL (ref 0.7–4.0)
MCHC: 33.2 g/dL (ref 30.0–36.0)
MCV: 99.7 fl (ref 78.0–100.0)
Monocytes Absolute: 0.8 10*3/uL (ref 0.1–1.0)
Monocytes Relative: 8.3 % (ref 3.0–12.0)
Neutro Abs: 6.2 10*3/uL (ref 1.4–7.7)
Neutrophils Relative %: 64.5 % (ref 43.0–77.0)
Platelets: 185 10*3/uL (ref 150.0–400.0)
RBC: 4.81 Mil/uL (ref 4.22–5.81)
RDW: 17.7 % — ABNORMAL HIGH (ref 11.5–15.5)
WBC: 9.6 10*3/uL (ref 4.0–10.5)

## 2020-06-22 LAB — HEPATIC FUNCTION PANEL
ALT: 115 U/L — ABNORMAL HIGH (ref 0–53)
AST: 132 U/L — ABNORMAL HIGH (ref 0–37)
Albumin: 3 g/dL — ABNORMAL LOW (ref 3.5–5.2)
Alkaline Phosphatase: 786 U/L — ABNORMAL HIGH (ref 39–117)
Bilirubin, Direct: 2.1 mg/dL — ABNORMAL HIGH (ref 0.0–0.3)
Total Bilirubin: 4.3 mg/dL — ABNORMAL HIGH (ref 0.2–1.2)
Total Protein: 6.5 g/dL (ref 6.0–8.3)

## 2020-06-22 LAB — IBC PANEL
Iron: 94 ug/dL (ref 42–165)
Saturation Ratios: 36.1 % (ref 20.0–50.0)
Transferrin: 186 mg/dL — ABNORMAL LOW (ref 212.0–360.0)

## 2020-06-22 LAB — FERRITIN: Ferritin: 697.8 ng/mL — ABNORMAL HIGH (ref 22.0–322.0)

## 2020-06-22 LAB — PROTIME-INR
INR: 1.1 ratio — ABNORMAL HIGH (ref 0.8–1.0)
Prothrombin Time: 12.2 s (ref 9.6–13.1)

## 2020-06-22 NOTE — H&P (View-Only) (Signed)
   ASSESSMENT AND PLAN    # 87 yo male with markedly elevated liver tests ( predominantly cholestatic) and leukocytosis last month. Labs on the heels of a "stomach virus" consisting of upper abdominal pain and diarrhea. CT scan negative for biliary duct dilation or pancreatic lesion. There is a peripheral liver lesion measuring 14 mm and favored to be an hemangioma but metastatic deposit not exluded. Rule out viral hepatitis. Hepatitis may also be secondary to some other infectious process.  Drug induced hepatitis unlikely as he only takes ASA and a vitamin for his eyes. Congested hepatopathy may be contributing.  --Repeat CBC and LFTs today. Obtain hepatic serologic evaluation  --MRCP to further bile ducts, look for small bile duct stones, and further evaluation peripheral liver lesion seen on CT scan.   --Will notify patient and make further recommendations as test results become available.   # Abdominal aortic aneurysm measuring 5.4 cm seen on recent CT scan  # Diastolic dysfunction ( grade 3) on last echo in 2017. He has pitting edema of BLE. Chronically SOB.   May need updated echo. Diuretics? Will defer to PCP.   HISTORY OF PRESENT ILLNESS     Primary Gastroenterologist : new - Steven Armbruster, MD  Chief Complaint : jaundice  David W Hue Sr Sr. is a 87 y.o. male with PMH / PSH significant for,  but not necessarily limited to: AAA, diverticulosis, fatty liver disease, CAD, carotid artery stenosis, CVA, CKD, macular degeneration  Referred by PCP for abnormal liver tests. Patient saw PCP 06/01/20 for evaluation of jaundice noticed by family.  Alkaline phos was 689, AST 191, ALT 170, total bilirubin 16.7.  WBC 18.4, hemoglobin 15.8. No recent labs to compare but in 2017 his liver tests were He complained of fatigue. He had a "stomach virus" around Thanksgiving with diarrhea, generalized abdominal pain.The abdominal pain was a constant, non-radiating upper abdominal pain. No vomiting  or fevers.  The diarrhea lasted two days but pain persisted for a couple of weeks. He hasn't had any antibiotics or medication changes in the last two months.   He takes a Vitamin for macular degeneration and a daily asa. No other vitamins. No herbs. No known history of liver problems. He has recently lost a few pounds but nothing drastic. He has dry skin and back itches but no generalized pruritis. He doesn't know if urine is dark.   In mid December patient'sunremarkable.  CT scan shows fatty liver, focal enhancing lesion along the periphery of the right lobe measuring 14 mm.  Lesion felt to likely be a hemangioma but a metastatic deposit could not be excluded.  No evidence for cholelithiasis. CBD is prominent within normal limits for patient's age.  No pancreatic masses, mild free fluid in the abdomen and pelvis  He endorses chronic SOB. No coughing.    Data Reviewed:  06/01/20 Alkaline phos 689, AST 191, ALT 170, total bilirubin 16.7  WBC 18.4, hemoglobin 15.8.  06/04/20 CT scan w/ contrast IMPRESSION: Decompressed gallbladder with upper limits normal common bile duct based on patient's age. The intrahepatic biliary tree is not significantly dilated.  Fatty liver with a focal enhancing deposit which may represent a hemangioma although metastatic deposit could not be totally excluded. No definitive abnormality to correspond with the elevated LFTs are seen  Nonobstructing right renal calculi and cystic change.  Bilobed abdominal aortic aneurysm measuring up to 5.4 cm. Recommend follow-up CT/MR every 6 months and vascular consultation. This recommendation follows ACR consensus   guidelines: White Paper of the ACR Incidental Findings Committee II on Vascular Findings. J Am Coll Radiol 2013; 10:789-794.   Past Medical History:  Diagnosis Date  . Aortic stenosis    mild AS 01/2016 echo  . Carotid stenosis   . Chicken pox   . Coronary arteriosclerosis due to lipid rich plaque  01/30/2016  . Edema   . Myocardial infarct ([place])    1988 and 1990  . Stroke [facility])      Past Surgical History:  Procedure Laterality Date  . CARDIAC CATHETERIZATION N/A 02/04/2016   Procedure: Left Heart Cath and Coronary Angiography;  Surgeon: Jolaine Artist, MD;  Location: [place] CV [facility];  Service: Cardiovascular;  Laterality: N/A;  . CAROTID ENDARTERECTOMY Right 02/14/2016  . CATARACT EXTRACTION    . ENDARTERECTOMY Right 02/14/2016   Procedure: RIGHT CAROTID ENDARTERECTOMY;  Surgeon: [name], MD;  Location: [place];  Service: Vascular;  Laterality: Right;  . PATCH ANGIOPLASTY Right 02/14/2016   Procedure: [place] X2;  Surgeon: [name], MD;  Location: [facility] OR;  Service: Vascular;  Laterality: Right;  . testicle     right hydrocele   Family History  Problem Relation Age of Onset  . Heart attack Mother   . Heart disease Mother   . Bone cancer Father   . Cancer Father    Social History   Tobacco Use  . Smoking status: Former Smoker    Packs/day: 1.00    Years: 35.00    Pack years: 35.00    Quit date: 07/13/1985    Years since quitting: 34.9  . Smokeless tobacco: Never Used  Substance Use Topics  . Alcohol use: No  . Drug use: No   Current Outpatient Medications  Medication Sig Dispense Refill  . aspirin EC 325 MG tablet Take 1 tablet (325 mg total) by mouth daily. 30 tablet 0  . Multiple Vitamins-Minerals (ICAPS) TABS Take by mouth.     No current facility-administered medications for this visit.   Allergies  Allergen Reactions  . Atorvastatin Rash and Other (See Comments)    Patient started on plavix and lipitor at the same time and developed rash  . Brilinta [Ticagrelor] Hives  . Plavix [Clopidogrel Bisulfate] Rash and Other (See Comments)    Patient started on plavix and lipitor at the same time and developed rash     Review of Systems: All systems reviewed and negative  except where noted in HPI.   PHYSICAL EXAM :    Wt Readings from Last 3 Encounters:  06/01/20 153 lb 12.8 oz (69.8 kg)  09/19/16 162 lb (73.5 kg)  03/28/16 156 lb (70.8 kg)    Pulse 96   Ht 5' (1.524 m)   Wt 158 lb (71.7 kg)   BMI 30.86 kg/m  Constitutional:  Pleasant male in no acute distress. In wheelchair. Son is present Psychiatric: Normal mood and affect. Behavior is normal. EENT: Pupils normal.   Neck supple.  Cardiovascular: Distant heart sounds. BLE edema. No obvious JVD Pulmonary/chest: breathing slightly labored.  LLL crackles Abdominal: Examined in wheelchair. Abdomen soft, nondistended, nontender. Bowel sounds active throughout. Neurological: Alert and oriented to person place and time. Skin: Skin is warm and dry. No rashes noted.  Tye Savoy, NP  06/22/2020, 9:49 AM  Cc:  Referring Provider Pleas Koch, NP

## 2020-06-22 NOTE — Patient Instructions (Signed)
If you are age 85 or older, your body mass index should be between 23-30. Your Body mass index is 30.86 kg/m. If this is out of the aforementioned range listed, please consider follow up with your Primary Care Provider.  If you are age 65 or younger, your body mass index should be between 19-25. Your Body mass index is 30.86 kg/m. If this is out of the aformentioned range listed, please consider follow up with your Primary Care Provider.   Your provider has requested that you go to the basement level for lab work before leaving today. Press "B" on the elevator. The lab is located at the first door on the left as you exit the elevator.  You have been scheduled for an MRI at [facility] on 07/05/2020. Your appointment time is 9:00 am. Please arrive 30 minutes prior to your appointment time for registration purposes. Please make certain not to have anything to eat or drink starting at midnight. In addition, if you have any metal in your body, have a pacemaker or defibrillator, please be sure to let your ordering physician know. This test typically takes 45 minutes to 1 hour to complete. Should you need to reschedule, please call [phone] to do so.  Follow up pending the results of your labs and MRI.  Thank you for entrusting me with your care and choosing [facility].  Tye Savoy, NP-C

## 2020-06-22 NOTE — Progress Notes (Signed)
Agree with assessment with the following thoughts: - repeat LFTs look much better, bilirubin has downtrended  - agree with serology workup. Of note, he needs hep A IgM to rule out acute hep A which can cause cholestasis. Please check INR if not already done. - agree with MRCP to further evaluate this issue - if serologic workup  negative, imaging unremarkable and LAEs persistently remain abnormal or rise, may need to consider liver biopsy. Will await his course. Nevin Bloodgood, please forward results to me for review. Thanks

## 2020-06-22 NOTE — Progress Notes (Signed)
ASSESSMENT AND PLAN    # 87 yo male with markedly elevated liver tests ( predominantly cholestatic) and leukocytosis last month. Labs on the heels of a "stomach virus" consisting of upper abdominal pain and diarrhea. CT scan negative for biliary duct dilation or pancreatic lesion. There is a peripheral liver lesion measuring 14 mm and favored to be an hemangioma but metastatic deposit not exluded. Rule out viral hepatitis. Hepatitis may also be secondary to some other infectious process.  Drug induced hepatitis unlikely as he only takes ASA and a vitamin for his eyes. Congested hepatopathy may be contributing.  --Repeat CBC and LFTs today. Obtain hepatic serologic evaluation  --MRCP to further bile ducts, look for small bile duct stones, and further evaluation peripheral liver lesion seen on CT scan.   --Will notify patient and make further recommendations as test results become available.   # Abdominal aortic aneurysm measuring 5.4 cm seen on recent CT scan  # Diastolic dysfunction ( grade 3) on last echo in 2017. He has pitting edema of BLE. Chronically SOB.   May need updated echo. Diuretics? Will defer to PCP.   HISTORY OF PRESENT ILLNESS     Primary Gastroenterologist : new - [place] Cellar, MD  Chief Complaint : jaundice  David Bradshaw. is a 87 y.o. male with PMH / [place] significant for,  but not necessarily limited to: AAA, diverticulosis, fatty liver disease, CAD, carotid artery stenosis, CVA, CKD, macular degeneration  Referred by PCP for abnormal liver tests. Patient saw PCP 06/01/20 for evaluation of jaundice noticed by family.  Alkaline phos was 689, AST 191, ALT 170, total bilirubin 16.7.  WBC 18.4, hemoglobin 15.8. No recent labs to compare but in 2017 his liver tests were He complained of fatigue. He had a "stomach virus" around Thanksgiving with diarrhea, generalized abdominal pain.The abdominal pain was a constant, non-radiating upper abdominal pain. No vomiting  or fevers.  The diarrhea lasted two days but pain persisted for a couple of weeks. He hasn't had any antibiotics or medication changes in the last two months.   He takes a Vitamin for macular degeneration and a daily asa. No other vitamins. No herbs. No known history of liver problems. He has recently lost a few pounds but nothing drastic. He has dry skin and back itches but no generalized pruritis. He doesn't know if urine is dark.   In mid December patient'sunremarkable.  CT scan shows fatty liver, focal enhancing lesion along the periphery of the right lobe measuring 14 mm.  Lesion felt to likely be a hemangioma but a metastatic deposit could not be excluded.  No evidence for cholelithiasis. CBD is prominent within normal limits for patient's age.  No pancreatic masses, mild free fluid in the abdomen and pelvis  He endorses chronic SOB. No coughing.    Data Reviewed:  06/01/20 Alkaline phos 689, AST 191, ALT 170, total bilirubin 16.7  WBC 18.4, hemoglobin 15.8.  06/04/20 CT scan w/ contrast IMPRESSION: Decompressed gallbladder with upper limits normal common bile duct based on patient's age. The intrahepatic biliary tree is not significantly dilated.  Fatty liver with a focal enhancing deposit which may represent a hemangioma although metastatic deposit could not be totally excluded. No definitive abnormality to correspond with the elevated LFTs are seen  Nonobstructing right renal calculi and cystic change.  Bilobed abdominal aortic aneurysm measuring up to 5.4 cm. Recommend follow-up CT/MR every 6 months and vascular consultation. This recommendation follows ACR consensus  guidelines: White Paper of the ACR Incidental Findings Committee II on Vascular Findings. J Am Coll Radiol 2013; 10:789-794.   Past Medical History:  Diagnosis Date  . Aortic stenosis    mild AS 01/2016 echo  . Carotid stenosis   . Chicken pox   . Coronary arteriosclerosis due to lipid rich plaque  01/30/2016  . Edema   . Myocardial infarct ([place])    1988 and 1990  . Stroke [facility])      Past Surgical History:  Procedure Laterality Date  . CARDIAC CATHETERIZATION N/A 02/04/2016   Procedure: Left Heart Cath and Coronary Angiography;  Surgeon: Jolaine Artist, MD;  Location: [place] [facility];  Service: Cardiovascular;  Laterality: N/A;  . CAROTID ENDARTERECTOMY Right 02/14/2016  . CATARACT EXTRACTION    . ENDARTERECTOMY Right 02/14/2016   Procedure: RIGHT CAROTID ENDARTERECTOMY;  Surgeon: [name], MD;  Location: [place];  Service: Vascular;  Laterality: Right;  . PATCH ANGIOPLASTY Right 02/14/2016   Procedure: [place] X2;  Surgeon: [name], MD;  Location: [facility] OR;  Service: Vascular;  Laterality: Right;  . testicle     right hydrocele   Family History  Problem Relation Age of Onset  . Heart attack Mother   . Heart disease Mother   . Bone cancer Father   . Cancer Father    Social History   Tobacco Use  . Smoking status: Former Smoker    Packs/day: 1.00    Years: 35.00    Pack years: 35.00    Quit date: 07/13/1985    Years since quitting: 34.9  . Smokeless tobacco: Never Used  Substance Use Topics  . Alcohol use: No  . Drug use: No   Current Outpatient Medications  Medication Sig Dispense Refill  . aspirin EC 325 MG tablet Take 1 tablet (325 mg total) by mouth daily. 30 tablet 0  . Multiple Vitamins-Minerals (ICAPS) TABS Take by mouth.     No current facility-administered medications for this visit.   Allergies  Allergen Reactions  . Atorvastatin Rash and Other (See Comments)    Patient started on plavix and lipitor at the same time and developed rash  . Brilinta [Ticagrelor] Hives  . Plavix [Clopidogrel Bisulfate] Rash and Other (See Comments)    Patient started on plavix and lipitor at the same time and developed rash     Review of Systems: All systems reviewed and negative  except where noted in HPI.   PHYSICAL EXAM :    Wt Readings from Last 3 Encounters:  06/01/20 153 lb 12.8 oz (69.8 kg)  09/19/16 162 lb (73.5 kg)  03/28/16 156 lb (70.8 kg)    Pulse 96   Ht 5' (1.524 m)   Wt 158 lb (71.7 kg)   BMI 30.86 kg/m  Constitutional:  Pleasant male in no acute distress. In wheelchair. Son is present Psychiatric: Normal mood and affect. Behavior is normal. EENT: Pupils normal.   Neck supple.  Cardiovascular: Distant heart sounds. BLE edema. No obvious JVD Pulmonary/chest: breathing slightly labored.  LLL crackles Abdominal: Examined in wheelchair. Abdomen soft, nondistended, nontender. Bowel sounds active throughout. Neurological: Alert and oriented to person place and time. Skin: Skin is warm and dry. No rashes noted.  Tye Savoy, NP  06/22/2020, 9:49 AM  Cc:  Referring Provider Pleas Koch, NP

## 2020-06-26 LAB — ANA: Anti Nuclear Antibody (ANA): NEGATIVE

## 2020-06-26 LAB — ANTI-SMOOTH MUSCLE ANTIBODY, IGG: Actin (Smooth Muscle) Antibody (IGG): <20 U (ref <20)

## 2020-06-26 LAB — MITOCHONDRIAL ANTIBODIES: Mitochondrial M2 Ab, IgG: <=20 U

## 2020-06-26 LAB — IGA: Immunoglobulin A: 1005 mg/dL — ABNORMAL HIGH (ref 70–320)

## 2020-06-26 LAB — HEPATITIS A ANTIBODY, TOTAL: Hepatitis A AB,Total: NONREACTIVE

## 2020-06-26 LAB — TISSUE TRANSGLUTAMINASE, IGA: (tTG) Ab, IgA: <1 U/mL

## 2020-06-26 LAB — HEPATITIS C ANTIBODY
Hepatitis C Ab: NONREACTIVE
SIGNAL TO CUT-OFF: 0.1 (ref <1.00)

## 2020-06-26 LAB — HEPATITIS B SURFACE ANTIBODY,QUALITATIVE: Hep B S Ab: NONREACTIVE

## 2020-06-26 LAB — HEPATITIS B SURFACE ANTIGEN: Hepatitis B Surface Ag: NONREACTIVE

## 2020-06-28 ENCOUNTER — Other Ambulatory Visit: Payer: Self-pay | Admitting: *Deleted

## 2020-06-28 DIAGNOSIS — I6529 Occlusion and stenosis of unspecified carotid artery: Secondary | ICD-10-CM

## 2020-07-05 ENCOUNTER — Other Ambulatory Visit: Payer: Self-pay | Admitting: Nurse Practitioner

## 2020-07-05 ENCOUNTER — Ambulatory Visit (HOSPITAL_COMMUNITY)
Admission: RE | Admit: 2020-07-05 | Discharge: 2020-07-05 | Disposition: A | Discharge status: Home / Self Care | Payer: Medicare HMO | Source: Ambulatory Visit | Attending: Nurse Practitioner | Admitting: Nurse Practitioner

## 2020-07-05 ENCOUNTER — Other Ambulatory Visit: Payer: Self-pay

## 2020-07-05 DIAGNOSIS — R7989 Other specified abnormal findings of blood chemistry: Secondary | ICD-10-CM | POA: Diagnosis not present

## 2020-07-05 DIAGNOSIS — K8050 Calculus of bile duct without cholangitis or cholecystitis without obstruction: Secondary | ICD-10-CM | POA: Diagnosis not present

## 2020-07-05 DIAGNOSIS — C220 Liver cell carcinoma: Secondary | ICD-10-CM

## 2020-07-05 DIAGNOSIS — K8020 Calculus of gallbladder without cholecystitis without obstruction: Secondary | ICD-10-CM | POA: Diagnosis not present

## 2020-07-05 DIAGNOSIS — K7460 Unspecified cirrhosis of liver: Secondary | ICD-10-CM | POA: Diagnosis not present

## 2020-07-05 HISTORY — DX: Liver cell carcinoma: C22.0

## 2020-07-05 MED ORDER — GADOBUTROL 1 MMOL/ML IV SOLN
7.0000 mL | Freq: Once | INTRAVENOUS | Status: AC | PRN
  Administered 2020-07-05: 7 mL via INTRAVENOUS

## 2020-07-06 ENCOUNTER — Other Ambulatory Visit: Payer: Self-pay

## 2020-07-06 ENCOUNTER — Other Ambulatory Visit (INDEPENDENT_AMBULATORY_CARE_PROVIDER_SITE_OTHER): Payer: Medicare HMO

## 2020-07-06 DIAGNOSIS — R17 Unspecified jaundice: Secondary | ICD-10-CM

## 2020-07-06 DIAGNOSIS — R7989 Other specified abnormal findings of blood chemistry: Secondary | ICD-10-CM

## 2020-07-06 LAB — HEPATIC FUNCTION PANEL
ALT: 85 U/L — ABNORMAL HIGH (ref 0–53)
AST: 91 U/L — ABNORMAL HIGH (ref 0–37)
Albumin: 2.9 g/dL — ABNORMAL LOW (ref 3.5–5.2)
Alkaline Phosphatase: 679 U/L — ABNORMAL HIGH (ref 39–117)
Bilirubin, Direct: 1.2 mg/dL — ABNORMAL HIGH (ref 0.0–0.3)
Total Bilirubin: 2.5 mg/dL — ABNORMAL HIGH (ref 0.2–1.2)
Total Protein: 6.2 g/dL (ref 6.0–8.3)

## 2020-07-09 ENCOUNTER — Other Ambulatory Visit: Payer: Self-pay

## 2020-07-09 DIAGNOSIS — C220 Liver cell carcinoma: Secondary | ICD-10-CM

## 2020-07-09 DIAGNOSIS — R17 Unspecified jaundice: Secondary | ICD-10-CM

## 2020-07-09 DIAGNOSIS — R7989 Other specified abnormal findings of blood chemistry: Secondary | ICD-10-CM

## 2020-07-09 DIAGNOSIS — K831 Obstruction of bile duct: Secondary | ICD-10-CM

## 2020-07-10 ENCOUNTER — Other Ambulatory Visit: Payer: Self-pay

## 2020-07-11 ENCOUNTER — Telehealth: Payer: Self-pay

## 2020-07-11 ENCOUNTER — Other Ambulatory Visit: Payer: Self-pay

## 2020-07-11 DIAGNOSIS — I719 Aortic aneurysm of unspecified site, without rupture: Secondary | ICD-10-CM

## 2020-07-11 DIAGNOSIS — R7989 Other specified abnormal findings of blood chemistry: Secondary | ICD-10-CM

## 2020-07-11 DIAGNOSIS — K831 Obstruction of bile duct: Secondary | ICD-10-CM

## 2020-07-11 DIAGNOSIS — C220 Liver cell carcinoma: Secondary | ICD-10-CM

## 2020-07-11 NOTE — Telephone Encounter (Signed)
Ambulatory referral in epic to Interventional radiology.  Lab orders in epic.   Patient is already scheduled with vascular surgery on 09/14/20.   Called son, [name], he is not in at this time. Advised that [name] give me a call back for update.

## 2020-07-11 NOTE — Telephone Encounter (Signed)
From: Yetta Flock, MD  Sent: 07/11/2020 12:43 PM EST  To: Willia Craze, NP, *  Subject: RE: [place] Discussion                Thanks Tye Savoy for the follow up.   Will refer him to IR so they can discuss options with him as well as vascular surgery.  Will also have him come in for more labs.   Deagen Krass can you please refer this patient to IR for [facility] as well as vascular surgery. Can you also order LFTs, INR, CBC, and AFP, he can go to the lab at his convenience.   Thanks,  Richardson Landry

## 2020-07-11 NOTE — Progress Notes (Signed)
The proposed treatment discussed in conference is for discussion purposes only and is not a binding recommendation.  The patients have not been physically examined, or presented with their treatment options.  Therefore, final treatment plans cannot be decided.   

## 2020-07-12 NOTE — Telephone Encounter (Signed)
[name] is returning your call

## 2020-07-12 NOTE — Telephone Encounter (Signed)
Spoke with patient's son [name], he is aware that we have placed a referral to IR, advised to give them at least a week to get in contact to schedule an appointment. [name] states that patient had labs last week, advised that I would check because not all labs were drawn. [name] verbalized understanding and had no concerns at the end of the call.   Dr. Havery Moros, patient came in on 07/06/20 for lab work, it looks like only the LFTs were drawn. Would you just like for him to complete the CBC, INR, and AFP? Please advise, thank you.

## 2020-07-12 NOTE — Telephone Encounter (Signed)
Thanks [place], Yes we want additional labs as outlined including the repeat LFTs. Thanks

## 2020-07-12 NOTE — Telephone Encounter (Signed)
Lm on vm for David Bradshaw to return call.

## 2020-07-12 NOTE — Telephone Encounter (Signed)
Spoke with [name], he is aware that patient will need to repeat LFTs along with additional lab work. He is aware that they can stop by at their convenience between 7:30 AM - 5 PM, Monday through Friday. [name] verbalized understanding and had no concerns at the end of the call.

## 2020-07-13 ENCOUNTER — Other Ambulatory Visit (INDEPENDENT_AMBULATORY_CARE_PROVIDER_SITE_OTHER): Payer: Medicare HMO

## 2020-07-13 ENCOUNTER — Ambulatory Visit: Payer: Medicare HMO | Admitting: Vascular Surgery

## 2020-07-13 ENCOUNTER — Telehealth: Payer: Self-pay | Admitting: Nurse Practitioner

## 2020-07-13 ENCOUNTER — Encounter (HOSPITAL_COMMUNITY): Payer: Medicare HMO

## 2020-07-13 ENCOUNTER — Other Ambulatory Visit (HOSPITAL_COMMUNITY)
Admission: RE | Admit: 2020-07-13 | Discharge: 2020-07-13 | Disposition: A | Discharge status: Home / Self Care | Payer: Medicare HMO | Source: Ambulatory Visit | Attending: Gastroenterology | Admitting: Gastroenterology

## 2020-07-13 DIAGNOSIS — Z20822 Contact with and (suspected) exposure to covid-19: Secondary | ICD-10-CM | POA: Diagnosis not present

## 2020-07-13 DIAGNOSIS — C220 Liver cell carcinoma: Secondary | ICD-10-CM | POA: Diagnosis not present

## 2020-07-13 DIAGNOSIS — R7989 Other specified abnormal findings of blood chemistry: Secondary | ICD-10-CM | POA: Diagnosis not present

## 2020-07-13 DIAGNOSIS — Z01812 Encounter for preprocedural laboratory examination: Secondary | ICD-10-CM | POA: Insufficient documentation

## 2020-07-13 DIAGNOSIS — K831 Obstruction of bile duct: Secondary | ICD-10-CM

## 2020-07-13 LAB — CBC WITH DIFFERENTIAL/PLATELET
Basophils Absolute: 0.1 10*3/uL (ref 0.0–0.1)
Basophils Relative: 1.2 % (ref 0.0–3.0)
Eosinophils Absolute: 0.2 10*3/uL (ref 0.0–0.7)
Eosinophils Relative: 2 % (ref 0.0–5.0)
HCT: 52.2 % — ABNORMAL HIGH (ref 39.0–52.0)
Hemoglobin: 17.3 g/dL — ABNORMAL HIGH (ref 13.0–17.0)
Lymphocytes Relative: 38.6 % (ref 12.0–46.0)
Lymphs Abs: 4.6 10*3/uL — ABNORMAL HIGH (ref 0.7–4.0)
MCHC: 33.1 g/dL (ref 30.0–36.0)
MCV: 100 fl (ref 78.0–100.0)
Monocytes Absolute: 1.1 10*3/uL — ABNORMAL HIGH (ref 0.1–1.0)
Monocytes Relative: 9.3 % (ref 3.0–12.0)
Neutro Abs: 5.9 10*3/uL (ref 1.4–7.7)
Neutrophils Relative %: 48.9 % (ref 43.0–77.0)
Platelets: 186 10*3/uL (ref 150.0–400.0)
RBC: 5.22 Mil/uL (ref 4.22–5.81)
RDW: 15.2 % (ref 11.5–15.5)
WBC: 12 10*3/uL — ABNORMAL HIGH (ref 4.0–10.5)

## 2020-07-13 LAB — HEPATIC FUNCTION PANEL
ALT: 65 U/L — ABNORMAL HIGH (ref 0–53)
AST: 75 U/L — ABNORMAL HIGH (ref 0–37)
Albumin: 3 g/dL — ABNORMAL LOW (ref 3.5–5.2)
Alkaline Phosphatase: 647 U/L — ABNORMAL HIGH (ref 39–117)
Bilirubin, Direct: 0.9 mg/dL — ABNORMAL HIGH (ref 0.0–0.3)
Total Bilirubin: 2.1 mg/dL — ABNORMAL HIGH (ref 0.2–1.2)
Total Protein: 6.5 g/dL (ref 6.0–8.3)

## 2020-07-13 LAB — PROTIME-INR
INR: 1 ratio (ref 0.8–1.0)
Prothrombin Time: 11.7 s (ref 9.6–13.1)

## 2020-07-13 LAB — SARS CORONAVIRUS 2 (TAT 6-24 HRS): SARS Coronavirus 2: NEGATIVE

## 2020-07-13 NOTE — Telephone Encounter (Signed)
Called Son David Bradshaw. Patient is feeling fine except for swelling in feet. Patient for ERCP / EUS on Tuesday. I told David Bradshaw that Oncology referral had been placed . Patient had appt with Vascular Surgery today but couldn't go, had to get tested for COVID. Appt rescheduled but not until April.

## 2020-07-16 LAB — AFP TUMOR MARKER: AFP-Tumor Marker: 48.6 ng/mL — ABNORMAL HIGH (ref <6.1)

## 2020-07-17 ENCOUNTER — Ambulatory Visit (HOSPITAL_COMMUNITY)
Admission: RE | Admit: 2020-07-17 | Discharge: 2020-07-17 | Disposition: A | Discharge status: Home / Self Care | Payer: Medicare HMO | Attending: Gastroenterology | Admitting: Gastroenterology

## 2020-07-17 ENCOUNTER — Ambulatory Visit (HOSPITAL_COMMUNITY): Payer: Medicare HMO | Admitting: Anesthesiology

## 2020-07-17 ENCOUNTER — Encounter (HOSPITAL_COMMUNITY)
Admission: RE | Disposition: A | Discharge status: Home / Self Care | Payer: Self-pay | Source: Home / Self Care | Attending: Gastroenterology

## 2020-07-17 ENCOUNTER — Encounter (HOSPITAL_COMMUNITY): Payer: Self-pay | Admitting: Gastroenterology

## 2020-07-17 ENCOUNTER — Ambulatory Visit (HOSPITAL_COMMUNITY): Payer: Medicare HMO

## 2020-07-17 ENCOUNTER — Other Ambulatory Visit: Payer: Self-pay

## 2020-07-17 DIAGNOSIS — R945 Abnormal results of liver function studies: Secondary | ICD-10-CM | POA: Diagnosis present

## 2020-07-17 DIAGNOSIS — Z7982 Long term (current) use of aspirin: Secondary | ICD-10-CM | POA: Insufficient documentation

## 2020-07-17 DIAGNOSIS — I2510 Atherosclerotic heart disease of native coronary artery without angina pectoris: Secondary | ICD-10-CM | POA: Insufficient documentation

## 2020-07-17 DIAGNOSIS — Z8673 Personal history of transient ischemic attack (TIA), and cerebral infarction without residual deficits: Secondary | ICD-10-CM | POA: Insufficient documentation

## 2020-07-17 DIAGNOSIS — Z888 Allergy status to other drugs, medicaments and biological substances status: Secondary | ICD-10-CM | POA: Insufficient documentation

## 2020-07-17 DIAGNOSIS — I714 Abdominal aortic aneurysm, without rupture: Secondary | ICD-10-CM | POA: Insufficient documentation

## 2020-07-17 DIAGNOSIS — R0602 Shortness of breath: Secondary | ICD-10-CM | POA: Diagnosis not present

## 2020-07-17 DIAGNOSIS — K2091 Esophagitis, unspecified with bleeding: Secondary | ICD-10-CM

## 2020-07-17 DIAGNOSIS — R600 Localized edema: Secondary | ICD-10-CM | POA: Insufficient documentation

## 2020-07-17 DIAGNOSIS — K279 Peptic ulcer, site unspecified, unspecified as acute or chronic, without hemorrhage or perforation: Secondary | ICD-10-CM | POA: Diagnosis not present

## 2020-07-17 DIAGNOSIS — K259 Gastric ulcer, unspecified as acute or chronic, without hemorrhage or perforation: Secondary | ICD-10-CM | POA: Diagnosis not present

## 2020-07-17 DIAGNOSIS — K760 Fatty (change of) liver, not elsewhere classified: Secondary | ICD-10-CM | POA: Insufficient documentation

## 2020-07-17 DIAGNOSIS — I252 Old myocardial infarction: Secondary | ICD-10-CM | POA: Diagnosis not present

## 2020-07-17 DIAGNOSIS — K209 Esophagitis, unspecified without bleeding: Secondary | ICD-10-CM | POA: Insufficient documentation

## 2020-07-17 DIAGNOSIS — K8020 Calculus of gallbladder without cholecystitis without obstruction: Secondary | ICD-10-CM

## 2020-07-17 DIAGNOSIS — Z79899 Other long term (current) drug therapy: Secondary | ICD-10-CM | POA: Insufficient documentation

## 2020-07-17 DIAGNOSIS — Z8249 Family history of ischemic heart disease and other diseases of the circulatory system: Secondary | ICD-10-CM | POA: Diagnosis not present

## 2020-07-17 DIAGNOSIS — Z87891 Personal history of nicotine dependence: Secondary | ICD-10-CM | POA: Insufficient documentation

## 2020-07-17 DIAGNOSIS — H3530 Unspecified macular degeneration: Secondary | ICD-10-CM | POA: Diagnosis not present

## 2020-07-17 DIAGNOSIS — K831 Obstruction of bile duct: Secondary | ICD-10-CM

## 2020-07-17 DIAGNOSIS — N189 Chronic kidney disease, unspecified: Secondary | ICD-10-CM | POA: Diagnosis not present

## 2020-07-17 DIAGNOSIS — K2289 Other specified disease of esophagus: Secondary | ICD-10-CM | POA: Diagnosis not present

## 2020-07-17 DIAGNOSIS — R942 Abnormal results of pulmonary function studies: Secondary | ICD-10-CM | POA: Diagnosis present

## 2020-07-17 DIAGNOSIS — Z808 Family history of malignant neoplasm of other organs or systems: Secondary | ICD-10-CM | POA: Diagnosis not present

## 2020-07-17 DIAGNOSIS — K8050 Calculus of bile duct without cholangitis or cholecystitis without obstruction: Secondary | ICD-10-CM | POA: Diagnosis not present

## 2020-07-17 DIAGNOSIS — I8510 Secondary esophageal varices without bleeding: Secondary | ICD-10-CM | POA: Diagnosis not present

## 2020-07-17 DIAGNOSIS — K2980 Duodenitis without bleeding: Secondary | ICD-10-CM | POA: Insufficient documentation

## 2020-07-17 DIAGNOSIS — N200 Calculus of kidney: Secondary | ICD-10-CM | POA: Diagnosis not present

## 2020-07-17 DIAGNOSIS — K7460 Unspecified cirrhosis of liver: Secondary | ICD-10-CM | POA: Diagnosis not present

## 2020-07-17 DIAGNOSIS — K2950 Unspecified chronic gastritis without bleeding: Secondary | ICD-10-CM | POA: Diagnosis not present

## 2020-07-17 DIAGNOSIS — K3189 Other diseases of stomach and duodenum: Secondary | ICD-10-CM | POA: Insufficient documentation

## 2020-07-17 DIAGNOSIS — I6529 Occlusion and stenosis of unspecified carotid artery: Secondary | ICD-10-CM | POA: Diagnosis not present

## 2020-07-17 DIAGNOSIS — R17 Unspecified jaundice: Secondary | ICD-10-CM

## 2020-07-17 DIAGNOSIS — K269 Duodenal ulcer, unspecified as acute or chronic, without hemorrhage or perforation: Secondary | ICD-10-CM | POA: Insufficient documentation

## 2020-07-17 DIAGNOSIS — Z95818 Presence of other cardiac implants and grafts: Secondary | ICD-10-CM | POA: Insufficient documentation

## 2020-07-17 DIAGNOSIS — R7989 Other specified abnormal findings of blood chemistry: Secondary | ICD-10-CM

## 2020-07-17 DIAGNOSIS — I5030 Unspecified diastolic (congestive) heart failure: Secondary | ICD-10-CM | POA: Diagnosis not present

## 2020-07-17 DIAGNOSIS — K2211 Ulcer of esophagus with bleeding: Secondary | ICD-10-CM | POA: Diagnosis not present

## 2020-07-17 HISTORY — PX: REMOVAL OF STONES: SHX5545

## 2020-07-17 HISTORY — PX: SPHINCTEROTOMY: SHX5544

## 2020-07-17 HISTORY — PX: ERCP: SHX5425

## 2020-07-17 HISTORY — PX: BIOPSY: SHX5522

## 2020-07-17 SURGERY — ERCP, WITH INTERVENTION IF INDICATED
Anesthesia: General

## 2020-07-17 MED ORDER — OMEPRAZOLE 40 MG PO CPDR: 40.0000 mg | DELAYED_RELEASE_CAPSULE | Freq: Two times a day (BID) | ORAL | 12 refills | Status: DC

## 2020-07-17 MED ORDER — ROCURONIUM BROMIDE 10 MG/ML (PF) SYRINGE
PREFILLED_SYRINGE | INTRAVENOUS | Status: DC | PRN
  Administered 2020-07-17: 40 mg via INTRAVENOUS

## 2020-07-17 MED ORDER — EPHEDRINE SULFATE-NACL 50-0.9 MG/10ML-% IV SOSY
PREFILLED_SYRINGE | INTRAVENOUS | Status: DC | PRN
  Administered 2020-07-17: 10 mg via INTRAVENOUS

## 2020-07-17 MED ORDER — SODIUM CHLORIDE 0.9 % IV SOLN: INTRAVENOUS | Status: DC

## 2020-07-17 MED ORDER — INDOMETHACIN 50 MG RE SUPP
RECTAL | Status: DC | PRN
  Administered 2020-07-17: 100 mg via RECTAL

## 2020-07-17 MED ORDER — ONDANSETRON HCL 4 MG/2ML IJ SOLN
INTRAMUSCULAR | Status: DC | PRN
  Administered 2020-07-17: 4 mg via INTRAVENOUS

## 2020-07-17 MED ORDER — ALBUMIN HUMAN 5 % IV SOLN: 12.5000 g | Freq: Once | INTRAVENOUS | Status: DC

## 2020-07-17 MED ORDER — ALBUMIN HUMAN 5 % IV SOLN
INTRAVENOUS | Status: AC
  Filled 2020-07-17: qty 250

## 2020-07-17 MED ORDER — LACTATED RINGERS IV SOLN
INTRAVENOUS | Status: AC | PRN
  Administered 2020-07-17: 10 mL/h via INTRAVENOUS

## 2020-07-17 MED ORDER — LIDOCAINE 2% (20 MG/ML) 5 ML SYRINGE
INTRAMUSCULAR | Status: DC | PRN
  Administered 2020-07-17: 60 mg via INTRAVENOUS

## 2020-07-17 MED ORDER — CIPROFLOXACIN IN D5W 400 MG/200ML IV SOLN
INTRAVENOUS | Status: AC
  Filled 2020-07-17: qty 200

## 2020-07-17 MED ORDER — FENTANYL CITRATE (PF) 100 MCG/2ML IJ SOLN
INTRAMUSCULAR | Status: AC
  Filled 2020-07-17: qty 2

## 2020-07-17 MED ORDER — PHENYLEPHRINE 40 MCG/ML (10ML) SYRINGE FOR IV PUSH (FOR BLOOD PRESSURE SUPPORT)
PREFILLED_SYRINGE | INTRAVENOUS | Status: DC | PRN
  Administered 2020-07-17 (×3): 80 ug via INTRAVENOUS

## 2020-07-17 MED ORDER — GLUCAGON HCL RDNA (DIAGNOSTIC) 1 MG IJ SOLR
INTRAMUSCULAR | Status: DC | PRN
  Administered 2020-07-17 (×2): .25 mg via INTRAVENOUS

## 2020-07-17 MED ORDER — CIPROFLOXACIN IN D5W 400 MG/200ML IV SOLN
INTRAVENOUS | Status: DC | PRN
  Administered 2020-07-17: 400 mg via INTRAVENOUS

## 2020-07-17 MED ORDER — SUGAMMADEX SODIUM 200 MG/2ML IV SOLN
INTRAVENOUS | Status: DC | PRN
  Administered 2020-07-17: 200 mg via INTRAVENOUS

## 2020-07-17 MED ORDER — LACTATED RINGERS IV SOLN: INTRAVENOUS | Status: DC

## 2020-07-17 MED ORDER — INDOMETHACIN 50 MG RE SUPP
RECTAL | Status: AC
  Filled 2020-07-17: qty 2

## 2020-07-17 MED ORDER — LACTATED RINGERS IV SOLN: INTRAVENOUS | Status: DC | PRN

## 2020-07-17 MED ORDER — SUCCINYLCHOLINE CHLORIDE 200 MG/10ML IV SOSY
PREFILLED_SYRINGE | INTRAVENOUS | Status: DC | PRN
  Administered 2020-07-17: 160 mg via INTRAVENOUS

## 2020-07-17 MED ORDER — ALBUMIN HUMAN 25 % IV SOLN: 12.5000 g | Freq: Once | INTRAVENOUS | Status: DC

## 2020-07-17 MED ORDER — PHENYLEPHRINE HCL-NACL 10-0.9 MG/250ML-% IV SOLN
INTRAVENOUS | Status: DC | PRN
  Administered 2020-07-17: 50 ug/min via INTRAVENOUS

## 2020-07-17 MED ORDER — GLUCAGON HCL RDNA (DIAGNOSTIC) 1 MG IJ SOLR
INTRAMUSCULAR | Status: AC
  Filled 2020-07-17: qty 1

## 2020-07-17 MED ORDER — PROPOFOL 10 MG/ML IV BOLUS
INTRAVENOUS | Status: DC | PRN
  Administered 2020-07-17: 110 mg via INTRAVENOUS

## 2020-07-17 MED ORDER — FENTANYL CITRATE (PF) 250 MCG/5ML IJ SOLN
INTRAMUSCULAR | Status: DC | PRN
  Administered 2020-07-17 (×4): 25 ug via INTRAVENOUS

## 2020-07-17 NOTE — Progress Notes (Signed)
MAP is 65. Dr. Kalman Shan notified. VO for Albumin obtained. Charge RN made aware. Report off to [employer]. Exits my care.

## 2020-07-17 NOTE — Op Note (Signed)
[facility] Patient Name: David Bradshaw Procedure Date: 07/17/2020 MRN: [id number] Attending MD: Justice Britain , MD Date of Birth: 12/16/1926 CSN: [id number] Age: 87 Admit Type: Outpatient Procedure:                ERCP Indications:              Bile duct stone(s), Abnormal MRCP, Jaundice,                            Abnormal liver function test, Cirrhosis rule out                            esophageal varices Providers:                Justice Britain, MD, Cleda Daub, RN, Fransico Setters                            Mbumina, Technician Referring MD:             Carlota Raspberry. Havery Moros, MD, Willia Craze,                            Pleas Koch Medicines:                General Anesthesia, Cipro 400 mg IV, Indomethacin                            100 mg PR, Glucagon 0.5 mg IV Complications:            No immediate complications. Estimated Blood Loss:     Estimated blood loss was minimal. Procedure:                Pre-Anesthesia Assessment:                           - Prior to the procedure, a History and Physical                            was performed, and patient medications and                            allergies were reviewed. The patient's tolerance of                            previous anesthesia was also reviewed. The risks                            and benefits of the procedure and the sedation                            options and risks were discussed with the patient.                            All questions were answered, and informed consent  was obtained. Prior Anticoagulants: The patient has                            taken no previous anticoagulant or antiplatelet                            agents except for aspirin. ASA Grade Assessment:                            III - A patient with severe systemic disease. After                            reviewing the risks and benefits, the patient was                             deemed in satisfactory condition to undergo the                            procedure.                           After obtaining informed consent, the scope was                            passed under direct vision. Throughout the                            procedure, the patient's blood pressure, pulse, and                            oxygen saturations were monitored continuously. The                            GIF-H190 ([id number] Olympus gastroscope was                            introduced through the mouth, and used to inject                            contrast into and used to locate the major papilla.                            The Olympus [id number]-[id number]) was introduced                            through the mouth, and used to inject contrast into                            and used to inject contrast into the bile duct. The                            ERCP was accomplished without difficulty. The  patient tolerated the procedure. Scope In: Scope Out: Findings:      A standard esophagogastroduodenoscopy scope was used for the examination       of the upper gastrointestinal tract. The scope was passed under direct       vision through the upper GI tract. No gross lesions were noted in the       proximal esophagus and in the mid esophagus. Grade I varices were found       in the distal esophagus. Localized severe mucosal changes characterized       by congestion, erythema, friability (with spontaneous bleeding),       granularity, inflammation, altered texture and ulceration were found at       the gastroesophageal junction and extending into the cardia - this is       concerning for severe esophagitis vs possibility of an underlying       malignancy - this area was biopsied. A J-shaped deformity was found in       the entire examined stomach. Patchy moderate inflammation characterized       by erosions, erythema, friability and granularity was found in  the       entire examined stomach. Few non-bleeding cratered gastric ulcers with a       clean ulcer base (Forrest Class III) were found in the gastric body and       in the gastric antrum. The largest lesion was 8 mm in largest dimension.       Biopsies of the gastric mucosa were performed to rule out H. pylori.       Diffuse moderate inflammation characterized by congestion (edema),       erosions, erythema and friability was found in the duodenal bulb, in the       first portion of the duodenum and in the second portion of the duodenum.       Few non-bleeding superficial duodenal ulcers with a clean ulcer base       (Forrest Class III) were found in the duodenal bulb, in the first       portion of the duodenum and in the second portion of the duodenum. The       largest lesion was 6 mm in largest dimension. Biopsies of the duodenal       mucosa were performed to rule out H. pylori. The adult endoscope was       removed from the patient.      The scout film was normal. Using the duodenoscope, the esophagus was       successfully intubated under direct vision without detailed examination       of the pharynx, larynx, and associated structures, and upper GI tract.       The major papilla was normal but angled laterally towards the right. A       short 0.035 inch Soft Jagwire was passed into the biliary tree on first       attempt. The Hydratome sphincterotome was passed over the guidewire and       the bile duct was then deeply cannulated. Contrast was injected. I       personally interpreted the bile duct images. Ductal flow of contrast was       adequate. Image quality was adequate. Contrast extended to the cystic       duct and hepatic ducts. Opacification of the entire biliary tree except       for the gallbladder was successful.  The main bile duct was moderately       dilated. The largest diameter was 11 mm. The lower third of the main       bile duct contained filling defect thought  to be a stone. A 10 mm       biliary sphincterotomy was made with a monofilament Hydratome       sphincterotome using ERBE electrocautery. There was self limited oozing       from the sphincterotomy which did not require treatment. To discover       objects, the biliary tree was swept with a retrieval balloon starting       distally and extending to at the bifurcation. Sludge was swept from the       duct. One stone was removed. No stones remained. An occlusion       cholangiogram was performed that showed no further significant biliary       pathology.      A pancreatogram was not performed.      The duodenoscope was withdrawn from the patient. Impression:               - No gross lesions in esophagus proximally.                           - Grade I esophageal varices distally.                           - Congested, erythematous, friable (with                            spontaneous bleeding), granular, inflamed, texture                            changed, ulcerated mucosa in the esophagus -                            concern for severe esophagitis vs potential for                            underlying malignancy - biopsied.                           - J-shaped deformity in the entire stomach.                           - Gastritis. Non-bleeding gastric ulcers with a                            clean ulcer base (Forrest Class III). Biopsied for                            HP evaluation.                           - Duodenitis. Non-bleeding duodenal ulcers with a                            clean ulcer base (Forrest Class III). Biopsied for  HP evaluation.                           - The major papilla appeared normal.                           - A filling defect consistent with a stone was seen                            on the cholangiogram.                           - The entire main bile duct was moderately dilated                            from the stone.                            - Choledocholithiasis was found. Complete removal                            was accomplished by biliary sphincterotomy and                            balloon sweep. Moderate Sedation:      Not Applicable - Patient had care per Anesthesia. Recommendation:           - The patient will be observed post-procedure,                            until all discharge criteria are met.                           - Discharge patient to home.                           - Patient has a contact number available for                            emergencies. The signs and symptoms of potential                            delayed complications were discussed with the                            patient. Return to normal activities tomorrow.                            Written discharge instructions were provided to the                            patient.                           - Avoid aspirin and nonsteroidal anti-inflammatory  medicines for 2 weeks.                           - Start Omeprazole 40 mg twice daily.                           - Observe patient's clinical course.                           - Await path results.                           - Watch for pancreatitis, bleeding, perforation,                            and cholangitis.                           - Repeat EGD depending on final pathology - sooner                            if biopsies return negative for malignancy.                           - The findings and recommendations were discussed                            with the patient.                           - The findings and recommendations were discussed                            with the patient's family. Procedure Code(s):        --- Professional ---                           [phone], Endoscopic retrograde                            cholangiopancreatography (ERCP); with removal of                            calculi/debris from  biliary/pancreatic duct(s)                           43262, Endoscopic retrograde                            cholangiopancreatography (ERCP); with                            sphincterotomy/papillotomy Diagnosis Code(s):        --- Professional ---                           K74.60, Unspecified cirrhosis of liver  I85.10, Secondary esophageal varices without                            bleeding                           K20.91, Esophagitis, unspecified with bleeding                           K22.8, Other specified diseases of esophagus                           K22.11, Ulcer of esophagus with bleeding                           K31.89, Other diseases of stomach and duodenum                           K29.70, Gastritis, unspecified, without bleeding                           K25.9, Gastric ulcer, unspecified as acute or                            chronic, without hemorrhage or perforation                           K29.80, Duodenitis without bleeding                           K26.9, Duodenal ulcer, unspecified as acute or                            chronic, without hemorrhage or perforation                           R93.2, Abnormal findings on diagnostic imaging of                            liver and biliary tract                           K80.50, Calculus of bile duct without cholangitis                            or cholecystitis without obstruction                           R17, Unspecified jaundice                           R94.5, Abnormal results of liver function studies                           K83.8, Other specified diseases of biliary tract CPT copyright 2019 [employer]. All rights reserved. The codes documented in this report are preliminary and upon coder review may  be revised  to meet current compliance requirements. Justice Britain, MD 07/17/2020 2:41:48 PM Number of Addenda: 0

## 2020-07-17 NOTE — Anesthesia Preprocedure Evaluation (Addendum)
Anesthesia Evaluation  Patient identified by MRN, date of birth, ID band Patient awake    Reviewed: Allergy & Precautions, NPO status , Patient's Chart, lab work & pertinent test results  Airway Mallampati: II  TM Distance: >3 FB Neck ROM: Limited    Dental  (+) Edentulous Upper, Edentulous Lower   Pulmonary former smoker,    Pulmonary exam normal breath sounds clear to auscultation       Cardiovascular + CAD (on aspirin) and + Past MI (remote history)  Normal cardiovascular exam+ Valvular Problems/Murmurs AS  Rhythm:Regular Rate:Normal  2017 ECHO:  - Left ventricle: The cavity size was normal. There was moderate  concentric hypertrophy. Systolic function was normal. Wall motion  was normal; there were no regional wall motion abnormalities.  Doppler parameters are consistent with a reversible restrictive  pattern, indicative of decreased left ventricular diastolic  compliance and/or increased left atrial pressure (grade 3  diastolic dysfunction).  - Aortic valve: Transvalvular velocity was increased. There was  mild stenosis. Mean gradient (S): 19 mm Hg. Valve area (VTI):  0.97 cm^2. Valve area (Vmax): 0.98 cm^2. Valve area (Vmean): 0.74  cm^2.  - Mitral valve: Transvalvular velocity was within the normal range.  There was no evidence for stenosis. There was no regurgitation.  - Right ventricle: The cavity size was normal. Wall thickness was  normal. Systolic function was normal.  - Tricuspid valve: There was no regurgitation.    Neuro/Psych TIACVA negative psych ROS   GI/Hepatic negative GI ROS, Neg liver ROS,   Endo/Other  negative endocrine ROS  Renal/GU Renal InsufficiencyRenal disease  negative genitourinary   Musculoskeletal negative musculoskeletal ROS (+)   Abdominal   Peds negative pediatric ROS (+)  Hematology negative hematology ROS (+)   Anesthesia Other Findings    Reproductive/Obstetrics negative OB ROS                            Anesthesia Physical Anesthesia Plan  ASA: III  Anesthesia Plan: General   Post-op Pain Management:    Induction: Intravenous  PONV Risk Score and Plan: 2 and Treatment may vary due to age or medical condition and Ondansetron  Airway Management Planned: Oral ETT  Additional Equipment:   Intra-op Plan:   Post-operative Plan: Extubation in OR  Informed Consent: I have reviewed the patients History and Physical, chart, labs and discussed the procedure including the risks, benefits and alternatives for the proposed anesthesia with the patient or authorized representative who has indicated his/her understanding and acceptance.     Dental advisory given  Plan Discussed with: Anesthesiologist and CRNA  Anesthesia Plan Comments:        Anesthesia Quick Evaluation

## 2020-07-17 NOTE — Transfer of Care (Signed)
Immediate Anesthesia Transfer of Care Note  Patient: David Bradshaw Bradshaw.  Procedure(s) Performed: ENDOSCOPIC RETROGRADE CHOLANGIOPANCREATOGRAPHY (ERCP) (N/A )  Patient Location: Endoscopy Unit  Anesthesia Type:General  Level of Consciousness: awake, alert , oriented and patient cooperative  Airway & Oxygen Therapy: Patient Spontanous Breathing and Patient connected to face mask oxygen  Post-op Assessment: Report given to RN, Post -op Vital signs reviewed and stable and Patient moving all extremities  Post vital signs: Reviewed and stable  Last Vitals:  Vitals Value Taken Time  BP 109/84 07/17/20 1440  Temp    Pulse 110 07/17/20 1441  Resp 18 07/17/20 1441  SpO2 95 % 07/17/20 1441  Vitals shown include unvalidated device data.  Last Pain:  Vitals:   07/17/20 1115  TempSrc: Oral  PainSc: 0-No pain         Complications: No complications documented.

## 2020-07-17 NOTE — Anesthesia Procedure Notes (Signed)
Procedure Name: Intubation Date/Time: 07/17/2020 1:14 PM Performed by: Mitzie Na, CRNA Pre-anesthesia Checklist: Patient identified, Emergency Drugs available, Suction available and Patient being monitored Patient Re-evaluated:Patient Re-evaluated prior to induction Oxygen Delivery Method: Circle system utilized Preoxygenation: Pre-oxygenation with 100% oxygen Induction Type: IV induction and Rapid sequence Laryngoscope Size: Mac and 3 Grade View: Grade I Tube type: Oral Tube size: 7.0 mm Number of attempts: 1 Airway Equipment and Method: Stylet Placement Confirmation: ETT inserted through vocal cords under direct vision,  positive ETCO2 and breath sounds checked- equal and bilateral Secured at: 23 cm Tube secured with: Tape Dental Injury: Teeth and Oropharynx as per pre-operative assessment

## 2020-07-17 NOTE — Progress Notes (Signed)
LS diminshed on left side. Pt is awake, alert, answering all questions appropriately. Denies SHOB or chest pain. Skin w/d/pink. Resp wnl, equal and non-labored. Sat down to 90%RA. Dr. Elgie Congo made aware.   Dr. Valma Cava at bedside to eval. IS given. Pt taught how to use and demonstrated use appropriately. BP low 75/39. MD aware. VO to place LR at [facility]. IVF up a/o.

## 2020-07-17 NOTE — Interval H&P Note (Signed)
History and Physical Interval Note:  07/17/2020 12:35 PM  David Bradshaw.  has presented today for surgery, with the diagnosis of CBD stone.  The various methods of treatment have been discussed with the patient and family. After consideration of risks, benefits and other options for treatment, the patient has consented to  Procedure(s): ENDOSCOPIC RETROGRADE CHOLANGIOPANCREATOGRAPHY (ERCP) (N/A) as a surgical intervention.  The patient's history has been reviewed, patient examined, no change in status, stable for surgery.  I have reviewed the patient's chart and labs.  Questions were answered to the patient's satisfaction.     The risks of an ERCP were discussed at length, including but not limited to the risk of perforation, bleeding, abdominal pain, post-ERCP pancreatitis (while usually mild can be severe and even life threatening).   [employer]

## 2020-07-17 NOTE — Discharge Instructions (Signed)
YOU HAD AN ENDOSCOPIC PROCEDURE TODAY: Refer to the procedure report and other information in the discharge instructions given to you for any specific questions about what was found during the examination. If this information does not answer your questions, please call [place] office at [phone] to clarify.  ° °YOU SHOULD EXPECT: Some feelings of bloating in the abdomen. Passage of more gas than usual. Walking can help get rid of the air that was put into your GI tract during the procedure and reduce the bloating. If you had a lower endoscopy (such as a colonoscopy or flexible sigmoidoscopy) you may notice spotting of blood in your stool or on the toilet paper. Some abdominal soreness may be present for a day or two, also. ° °DIET: Your first meal following the procedure should be a light meal and then it is ok to progress to your normal diet. A half-sandwich or bowl of soup is an example of a good first meal. Heavy or fried foods are harder to digest and may make you feel nauseous or bloated. Drink plenty of fluids but you should avoid alcoholic beverages for 24 hours. If you had a esophageal dilation, please see attached instructions for diet.   ° °ACTIVITY: Your care partner should take you home directly after the procedure. You should plan to take it easy, moving slowly for the rest of the day. You can resume normal activity the day after the procedure however YOU SHOULD NOT DRIVE, use power tools, machinery or perform tasks that involve climbing or major physical exertion for 24 hours (because of the sedation medicines used during the test).  ° °SYMPTOMS TO REPORT IMMEDIATELY: °A gastroenterologist can be reached at any hour. Please call [phone]  for any of the following symptoms:  °Following lower endoscopy (colonoscopy, flexible sigmoidoscopy) °Excessive amounts of blood in the stool  °Significant tenderness, worsening of abdominal pains  °Swelling of the abdomen that is new, acute  °Fever of 100° or  higher  °Following upper endoscopy (EGD, EUS, ERCP, esophageal dilation) °Vomiting of blood or coffee ground material  °New, significant abdominal pain  °New, significant chest pain or pain under the shoulder blades  °Painful or persistently difficult swallowing  °New shortness of breath  °Black, tarry-looking or red, bloody stools ° °FOLLOW UP:  °If any biopsies were taken you will be contacted by phone or by letter within the next 1-3 weeks. Call [phone]  if you have not heard about the biopsies in 3 weeks.  °Please also call with any specific questions about appointments or follow up tests. ° °

## 2020-07-18 ENCOUNTER — Other Ambulatory Visit: Payer: Self-pay

## 2020-07-18 LAB — SURGICAL PATHOLOGY

## 2020-07-18 NOTE — Anesthesia Postprocedure Evaluation (Signed)
Anesthesia Post Note  Patient: JODY SILAS Sr Sr.  Procedure(s) Performed: ENDOSCOPIC RETROGRADE CHOLANGIOPANCREATOGRAPHY (ERCP) (N/A ) BIOPSY SPHINCTEROTOMY REMOVAL OF STONES     Patient location during evaluation: Phase II Anesthesia Type: General Level of consciousness: awake Pain management: pain level controlled Vital Signs Assessment: post-procedure vital signs reviewed and stable Respiratory status: spontaneous breathing Cardiovascular status: blood pressure returned to baseline Postop Assessment: no headache Anesthetic complications: no   No complications documented.  Last Vitals:  Vitals:   07/17/20 1650 07/17/20 1700  BP: 91/63 98/68  Pulse: (!) 102 (!) 102  Resp: 16 17  Temp:    SpO2: 92% 92%    Last Pain:  Vitals:   07/17/20 1700  TempSrc:   PainSc: 0-No pain                 Merlinda Frederick

## 2020-07-19 ENCOUNTER — Encounter (HOSPITAL_COMMUNITY): Payer: Self-pay | Admitting: Gastroenterology

## 2020-07-19 ENCOUNTER — Other Ambulatory Visit: Payer: Self-pay

## 2020-07-19 DIAGNOSIS — R600 Localized edema: Secondary | ICD-10-CM

## 2020-07-19 DIAGNOSIS — R7989 Other specified abnormal findings of blood chemistry: Secondary | ICD-10-CM

## 2020-07-19 DIAGNOSIS — C220 Liver cell carcinoma: Secondary | ICD-10-CM

## 2020-07-21 ENCOUNTER — Encounter: Payer: Self-pay | Admitting: Gastroenterology

## 2020-07-23 ENCOUNTER — Other Ambulatory Visit: Payer: Self-pay

## 2020-07-23 ENCOUNTER — Other Ambulatory Visit (INDEPENDENT_AMBULATORY_CARE_PROVIDER_SITE_OTHER): Payer: Medicare HMO

## 2020-07-23 DIAGNOSIS — R600 Localized edema: Secondary | ICD-10-CM | POA: Diagnosis not present

## 2020-07-23 LAB — BASIC METABOLIC PANEL
BUN: 15 mg/dL (ref 6–23)
CO2: 27 mEq/L (ref 19–32)
Calcium: 8.3 mg/dL — ABNORMAL LOW (ref 8.4–10.5)
Chloride: 105 mEq/L (ref 96–112)
Creatinine, Ser: 1.41 mg/dL (ref 0.40–1.50)
GFR: 43.65 mL/min — ABNORMAL LOW (ref >60.00)
Glucose, Bld: 105 mg/dL — ABNORMAL HIGH (ref 70–99)
Potassium: 4.8 mEq/L (ref 3.5–5.1)
Sodium: 141 mEq/L (ref 135–145)

## 2020-07-23 LAB — BRAIN NATRIURETIC PEPTIDE: Pro B Natriuretic peptide (BNP): 427 pg/mL — ABNORMAL HIGH (ref 0.0–100.0)

## 2020-07-23 NOTE — Telephone Encounter (Signed)
Lm on vm for patient's son to return call 

## 2020-07-24 DIAGNOSIS — R600 Localized edema: Secondary | ICD-10-CM

## 2020-07-24 MED ORDER — FUROSEMIDE 20 MG PO TABS: ORAL_TABLET | ORAL | 0 refills | Status: DC

## 2020-07-24 NOTE — Telephone Encounter (Signed)
Spoke with [name], he states that patient has been experiencing SOB for a while, nothing new but states that it is only with exertion. LE edema is about the same as it was when patient was here for his last visit with Nevin Bloodgood. Denies any use of diuretics. [name] states that patient saw his PCP yesterday and they did some lab work, he states that they have not suggested that he follow up with cardiology yet, he states that he is still waiting to hear about the lab results. [name] is aware that I will call him with any further recommendations. Please advise, thank you.

## 2020-07-25 ENCOUNTER — Other Ambulatory Visit: Payer: Self-pay | Admitting: Gastroenterology

## 2020-07-25 DIAGNOSIS — R7989 Other specified abnormal findings of blood chemistry: Secondary | ICD-10-CM

## 2020-07-25 DIAGNOSIS — C220 Liver cell carcinoma: Secondary | ICD-10-CM

## 2020-07-25 DIAGNOSIS — K831 Obstruction of bile duct: Secondary | ICD-10-CM

## 2020-07-27 ENCOUNTER — Ambulatory Visit
Admission: RE | Admit: 2020-07-27 | Discharge: 2020-07-27 | Disposition: A | Discharge status: Home / Self Care | Payer: Medicare HMO | Source: Ambulatory Visit | Attending: Gastroenterology | Admitting: Gastroenterology

## 2020-07-27 ENCOUNTER — Other Ambulatory Visit: Payer: Self-pay

## 2020-07-27 DIAGNOSIS — R7989 Other specified abnormal findings of blood chemistry: Secondary | ICD-10-CM

## 2020-07-27 DIAGNOSIS — C220 Liver cell carcinoma: Secondary | ICD-10-CM

## 2020-07-27 DIAGNOSIS — K831 Obstruction of bile duct: Secondary | ICD-10-CM

## 2020-07-27 HISTORY — PX: IR RADIOLOGIST EVAL & MGMT: IMG5224

## 2020-07-27 NOTE — Consult Note (Addendum)
Chief Complaint: Patient was consulted remotely today (TeleHealth) for hepatocellular carcinoma at the request of [facility].    Referring Physician(s): Armbruster,Steven P  History of Present Illness: David Bradshaw. is a 87 y.o. male with previously undiagnosed liver disease who presented with jaundice and abdominal pain in late December with blood work demonstrating abnormal liver function tests and a total bilirubin of as high as 16.7.  CT on 06/04/2020 demonstrated evidence of cirrhosis and a roughly 1.5 cm enhancing lesion in the peripheral subcapsular right lobe of the liver suspicious for hepatocellular carcinoma.  There also was a small amount of ascites including adjacent to the liver and liver lesion and a 5.4 cm infrarenal abdominal aortic aneurysm.  MRI of the abdomen on 07/05/2020 demonstrated imaging features consistent with a 1.6 cm subcapsular hepatocellular carcinoma in segment VI of the right lobe with MRI consistent with a LI-RADS 5 lesion.  David Bradshaw underwent ERCP on 07/17/2020 by Dr. Rush Landmark which revealed grade 1 distal esophageal varices.  The esophageal mucosa was also very inflamed and biopsied.  Gastritis and duodenitis was present. Biopsies of GE junction, stomach and duodenum did not show evidence of malignancy. Choledocholithiasis was found and sludge and a calculus removed after sphincterotomy and balloon sweep.  The patient's son states that David Bradshaw had considerable difficulty after the ERCP procedure performed under general anesthesia with regard to fatigue and slow recovery.  He currently is unable to walk much even with a walker and spends most of his time in bed or a recliner.  He has significant dyspnea with exertion and constant fatigue.  He has significant lower extremity edema.  He has decrease in appetite.  He denies abdominal pain, nausea, vomiting, melena or blood in his stool.  David Bradshaw lives alone but does have one of his sons very  close by.  They are quite concerned about his recent severe weakness and inability to walk much more than very short distances.  Past Medical History:  Diagnosis Date  . Aortic stenosis    mild AS 01/2016 echo  . Carotid stenosis   . Chicken pox   . Coronary arteriosclerosis due to lipid rich plaque 01/30/2016  . Edema   . Myocardial infarct ([place])    1988 and 1990  . Stroke [facility])     Past Surgical History:  Procedure Laterality Date  . BIOPSY  07/17/2020   Procedure: BIOPSY;  Surgeon: Rush Landmark Telford Nab., MD;  Location: [facility];  Service: Gastroenterology;;  . CARDIAC CATHETERIZATION N/A 02/04/2016   Procedure: Left Heart Cath and Coronary Angiography;  Surgeon: Jolaine Artist, MD;  Location: [facility];  Service: Cardiovascular;  Laterality: N/A;  . CAROTID ENDARTERECTOMY Right 02/14/2016  . CATARACT EXTRACTION    . ENDARTERECTOMY Right 02/14/2016   Procedure: RIGHT CAROTID ENDARTERECTOMY;  Surgeon: [name], MD;  Location: [facility];  Service: Vascular;  Laterality: Right;  . ERCP N/A 07/17/2020   Procedure: ENDOSCOPIC RETROGRADE CHOLANGIOPANCREATOGRAPHY (ERCP);  Surgeon: Irving Copas., MD;  Location: [facility];  Service: Gastroenterology;  Laterality: N/A;  . PATCH ANGIOPLASTY Right 02/14/2016   Procedure: PATCH ANGIOPLASTY USING [place] X2;  Surgeon: [name], MD;  Location: [facility];  Service: Vascular;  Laterality: Right;  . REMOVAL OF STONES  07/17/2020   Procedure: REMOVAL OF STONES;  Surgeon: Irving Copas., MD;  Location: [facility];  Service: Gastroenterology;;  . David Bradshaw  07/17/2020   Procedure: SPHINCTEROTOMY;  Surgeon:  Mansouraty, Telford Nab., MD;  Location: [facility];  Service: Gastroenterology;;  . testicle     right hydrocele    Allergies: Atorvastatin, Brilinta [ticagrelor], and Plavix [clopidogrel bisulfate]  Medications: Prior to Admission medications   Medication  Sig Start Date End Date Taking? Authorizing Provider  aspirin EC 325 MG tablet Take 1 tablet (325 mg total) by mouth daily. 02/06/16   Elgergawy, Silver Huguenin, MD  furosemide (LASIX) 20 MG tablet Take 1 or 2 tablets by mouth once daily for five days for leg swelling. 07/24/20   Pleas Koch, NP  Multiple Vitamins-Minerals (ICAPS) TABS Take 1 tablet by mouth daily at 12 noon.    [provider]  omeprazole (PRILOSEC) 40 MG capsule Take 1 capsule (40 mg total) by mouth 2 (two) times daily before a meal. 07/17/20 07/17/21  Mansouraty, Telford Nab., MD     Family History  Problem Relation Age of Onset  . Heart attack Mother   . Heart disease Mother   . Bone cancer Father   . Cancer Father     Social History   Socioeconomic History  . Marital status: Married    Spouse name: Not on file  . Number of children: 4  . Years of education: 35  . Highest education level: Not on file  Occupational History  . Occupation: Retired - Optician, dispensing  Tobacco Use  . Smoking status: Former Smoker    Packs/day: 1.00    Years: 35.00    Pack years: 35.00    Quit date: 07/13/1985    Years since quitting: 35.0  . Smokeless tobacco: Never Used  Substance and Sexual Activity  . Alcohol use: No  . Drug use: No  . Sexual activity: Not on file  Other Topics Concern  . Not on file  Social History Narrative   Fun: Watching TV.    Denies abuse and feels safe at home.    Social Determinants of Health   Financial Resource Strain: Not on file  Food Insecurity: Not on file  Transportation Needs: Not on file  Physical Activity: Not on file  Stress: Not on file  Social Connections: Not on file    ECOG Status: 3 - Symptomatic, >50% confined to bed  Review of Systems  Constitutional: Positive for activity change, appetite change and fatigue.  Respiratory: Positive for shortness of breath.   Cardiovascular: Positive for leg swelling.  Gastrointestinal: Negative.   Genitourinary: Negative.    Musculoskeletal: Positive for gait problem.    Review of Systems: A 12 point ROS discussed and pertinent positives are indicated in the HPI above.  All other systems are negative.  Physical Exam No direct physical exam was performed (except for noted visual exam findings with Video Visits).   Vital Signs: There were no vitals taken for this visit.  Imaging: MR 3D Recon At Scanner  Result Date: 07/05/2020 CLINICAL DATA:  Jaundice and abdominal pain for several weeks. Cirrhosis. Indeterminate liver lesion on recent CT. EXAM: MRI ABDOMEN WITHOUT AND WITH CONTRAST (INCLUDING MRCP) TECHNIQUE: Multiplanar multisequence MR imaging of the abdomen was performed both before and after the administration of intravenous contrast. Heavily T2-weighted images of the biliary and pancreatic ducts were obtained, and three-dimensional MRCP images were rendered by post processing. CONTRAST:  58mL GADAVIST GADOBUTROL 1 MMOL/ML IV SOLN COMPARISON:  CT on 06/04/2020 FINDINGS: Lower chest: No acute findings. Hepatobiliary: Hepatic cirrhosis is demonstrated. A subcapsular mass is seen in the lateral aspect of the right lobe which measures 1.6  cm on image 35/21. This shows diffuse arterial phase hyperenhancement, contrast washout, and delayed peripheral rim enhancement, highly suspicious for hepatocellular carcinoma. No other liver masses are identified. The gallbladder is contracted and contains multiple tiny calculi. No evidence of cholecystitis. Mild biliary ductal dilatation is seen with common bile duct measuring 8 mm. A calculus is seen in the distal common bile duct near the ampulla (e.g. Image 24/8) measuring approximately 7 mm. Pancreas: No mass or inflammatory changes. No evidence of pancreatic ductal dilatation or pancreas divisum. Spleen:  Within normal limits in size and appearance. Adrenals/Urinary Tract: No masses identified. Several renal cysts are noted bilaterally. No evidence of hydronephrosis. Stomach/Bowel:  Visualized portion unremarkable. Vascular/Lymphatic: No pathologically enlarged lymph nodes identified. 5.4 cm infrarenal abdominal aortic aneurysm is again seen. Other:  Mild ascites is again noted. Musculoskeletal:  No suspicious bone lesions identified. IMPRESSION: Hepatic cirrhosis. 1.6 cm subcapsular mass in the lateral aspect of right lobe, which has characteristics diagnostic for hepatocellular carcinoma. (LI-RADS Category 5: Definitely HCC) Mild biliary ductal dilatation, with 7 mm calculus in distal common bile duct. Cholelithiasis. No radiographic evidence of cholecystitis. 5.4 cm infrarenal abdominal aortic aneurysm. Recommend follow-up CT/MR every 6 months and vascular consultation. This recommendation follows ACR consensus guidelines: White Paper of the ACR Incidental Findings Committee II on Vascular Findings. J Am Coll Radiol 2013; 10:789-794. Electronically Signed   By: Marlaine Hind M.D.   On: 07/05/2020 11:43   DG ERCP BILIARY & PANCREATIC DUCTS  Result Date: 07/17/2020 CLINICAL DATA:  87 year old male undergoing ERCP and stone removal EXAM: ERCP TECHNIQUE: Multiple spot images obtained with the fluoroscopic device and submitted for interpretation post-procedure. FLUOROSCOPY TIME:  Fluoroscopy Time:  1 minutes 35 seconds Number of Acquired Spot Images: 0 COMPARISON:  MRCP 07/05/2020 FINDINGS: Total of 12 saved images are submitted for review. The images demonstrate a flexible duodenal scope in the descending duodenum followed by wire cannulation of the common bile duct. Subsequent cholangiogram demonstrates filling defects in the distal common bile duct consistent with choledocholithiasis. Subsequent images confirm sphincterotomy and balloon sweeping of the common duct. IMPRESSION: 1. Choledocholithiasis. 2. ERCP with sphincterotomy and balloon sweeping of the common duct. These images were submitted for radiologic interpretation only. Please see the procedural report for the amount of contrast  and the fluoroscopy time utilized. Electronically Signed   By: Jacqulynn Cadet M.D.   On: 07/17/2020 15:13   MR ABDOMEN MRCP W WO CONTAST  Result Date: 07/05/2020 CLINICAL DATA:  Jaundice and abdominal pain for several weeks. Cirrhosis. Indeterminate liver lesion on recent CT. EXAM: MRI ABDOMEN WITHOUT AND WITH CONTRAST (INCLUDING MRCP) TECHNIQUE: Multiplanar multisequence MR imaging of the abdomen was performed both before and after the administration of intravenous contrast. Heavily T2-weighted images of the biliary and pancreatic ducts were obtained, and three-dimensional MRCP images were rendered by post processing. CONTRAST:  88mL GADAVIST GADOBUTROL 1 MMOL/ML IV SOLN COMPARISON:  CT on 06/04/2020 FINDINGS: Lower chest: No acute findings. Hepatobiliary: Hepatic cirrhosis is demonstrated. A subcapsular mass is seen in the lateral aspect of the right lobe which measures 1.6 cm on image 35/21. This shows diffuse arterial phase hyperenhancement, contrast washout, and delayed peripheral rim enhancement, highly suspicious for hepatocellular carcinoma. No other liver masses are identified. The gallbladder is contracted and contains multiple tiny calculi. No evidence of cholecystitis. Mild biliary ductal dilatation is seen with common bile duct measuring 8 mm. A calculus is seen in the distal common bile duct near the ampulla (e.g. Image 24/8) measuring  approximately 7 mm. Pancreas: No mass or inflammatory changes. No evidence of pancreatic ductal dilatation or pancreas divisum. Spleen:  Within normal limits in size and appearance. Adrenals/Urinary Tract: No masses identified. Several renal cysts are noted bilaterally. No evidence of hydronephrosis. Stomach/Bowel: Visualized portion unremarkable. Vascular/Lymphatic: No pathologically enlarged lymph nodes identified. 5.4 cm infrarenal abdominal aortic aneurysm is again seen. Other:  Mild ascites is again noted. Musculoskeletal:  No suspicious bone lesions  identified. IMPRESSION: Hepatic cirrhosis. 1.6 cm subcapsular mass in the lateral aspect of right lobe, which has characteristics diagnostic for hepatocellular carcinoma. (LI-RADS Category 5: Definitely HCC) Mild biliary ductal dilatation, with 7 mm calculus in distal common bile duct. Cholelithiasis. No radiographic evidence of cholecystitis. 5.4 cm infrarenal abdominal aortic aneurysm. Recommend follow-up CT/MR every 6 months and vascular consultation. This recommendation follows ACR consensus guidelines: White Paper of the ACR Incidental Findings Committee II on Vascular Findings. J Am Coll Radiol 2013; 10:789-794. Electronically Signed   By: Marlaine Hind M.D.   On: 07/05/2020 11:43    Labs:  CBC: Recent Labs    06/01/20 0914 06/22/20 1043 07/13/20 0845  WBC 18.4 Repeated and verified X2.* 9.6 12.0*  HGB 15.8 15.9 17.3*  HCT 47.1 47.9 52.2*  PLT 307.0 185.0 186.0    COAGS: Recent Labs    06/22/20 1043 07/13/20 0845  INR 1.1* 1.0    BMP: Recent Labs    06/01/20 0914 07/23/20 1028  NA 141 141  K 4.1 4.8  CL 105 105  CO2 25 27  GLUCOSE 99 105*  BUN 28* 15  CALCIUM 8.6 8.3*  CREATININE 1.33 1.41    LIVER FUNCTION TESTS: Recent Labs    06/01/20 0914 06/22/20 1043 07/06/20 1548 07/13/20 0845  BILITOT 16.7* 4.3* 2.5* 2.1*  AST 191* 132* 91* 75*  ALT 170* 115* 85* 65*  ALKPHOS 689* 786* 679* 647*  PROT 6.2 6.5 6.2 6.5  ALBUMIN 3.0* 3.0* 2.9* 3.0*    TUMOR MARKERS: Recent Labs    07/13/20 0845  AFPTM 48.6*    Assessment and Plan:  I spoke to Mr. Rogers Blocker on a conference call which included his children Haze Justin and Thayer Headings.  We discussed treatment options for the small hepatocellular carcinoma which by my measurements on MRI measures approximately 1.4 x 1.6 cm.  AFP is elevated and imaging features are consistent with a definite HCC by MRI.  Treatment options include percutaneous thermal ablation, transcatheter chemoembolization versus radioembolization,  stereotactic radiation therapy or observation without current treatment.  I do not think that he is a candidate for thermal ablation due to his poor performance status currently and poor recovery after general anesthesia from his ERCP.  It appears that his performance status has declined quite severely just in the last 2 weeks.  There also is increased bleeding risk for thermal ablation due to the subcapsular location of the carcinoma and the presence of adjacent ascites.  He also has a history of prior perioperative stroke, carotid artery disease requiring endarterectomy and patch angioplasty, coronary artery disease and prior myocardial infarction which all increase his risk of complication with general anesthesia.  Transcatheter procedures would require the administration of iodinated contrast and he has had recent worsening of renal function with creatinine of 1.41 and estimated GFR of 43.7.  Although not prohibitive to administration of contrast, he certainly would be at some risk of nephrotoxicity with iodinated contrast administration.  I told Mr. Tramell and his family that the least invasive treatment option to consider would  be stereotactic radiation therapy.  The lesion is small and certainly not large enough to currently cause any significant symptoms or problems and could also be observed for a while given his advanced age.  I did tell them that fiducial markers may have to be placed adjacent to the lesion under ultrasound guidance prior to radiation therapy which could be performed fairly safely without the need for general anesthesia and after regional paracentesis to decrease any bleeding risk.  After discussion of treatment options, Mr. Dion and his family would like to speak to Radiation Oncology about potential candidacy for stereotactic radiation therapy.  I will make a referral to Dr. Lisbeth Renshaw and Shona Simpson, PA-C at the [facility].  He is also scheduled to follow-up with Dr. Donzetta Matters to  discuss his abdominal aortic aneurysm and for follow up of his carotid disease.  Thank you for this interesting consult.  I greatly enjoyed meeting David Bradshaw. and look forward to participating in their care.  A copy of this report was sent to the requesting provider on this date.  Electronically Signed: Azzie Roup 07/27/2020, 3:38 PM     I spent a total of 40 Minutes in remote  clinical consultation, greater than 50% of which was counseling/coordinating care for hepatocellular carcinoma.    Visit type: Audio only (telephone). Audio (no video) only due to patient's lack of internet/smartphone capability.   Alternative for in-person consultation at [facility], [address] [place], [place], [place]. This visit type was conducted due to national recommendations for restrictions regarding the COVID-19 Pandemic (e.g. social distancing).  This format is felt to be most appropriate for this patient at this time.  All issues noted in this document were discussed and addressed.

## 2020-07-30 ENCOUNTER — Telehealth: Payer: Self-pay

## 2020-07-30 NOTE — Telephone Encounter (Signed)
-----   Message from Roetta Sessions, [place] sent at 07/19/2020  9:04 AM EST ----- Regarding: FW: recall LFTs LFTs due around 2-16. Order is in.      ----- Message ----- From: Yetta Flock, MD Sent: 07/18/2020   7:31 AM EST To: Roetta Sessions, CMA Subject: recall LFTs                                    Jan can you please place a recall for repeat LFTs in 2 weeks for this patient? Thanks

## 2020-07-30 NOTE — Telephone Encounter (Signed)
Called and spoke to patient's son.  He understands patient needs to go to the lab this week.

## 2020-08-02 ENCOUNTER — Other Ambulatory Visit: Payer: Self-pay

## 2020-08-02 ENCOUNTER — Encounter: Payer: Self-pay | Admitting: Primary Care

## 2020-08-02 ENCOUNTER — Ambulatory Visit (INDEPENDENT_AMBULATORY_CARE_PROVIDER_SITE_OTHER): Payer: Medicare HMO | Admitting: Primary Care

## 2020-08-02 VITALS — BP 100/58 | HR 62 | Temp 97.6°F | Ht 60.0 in | Wt 163.0 lb

## 2020-08-02 DIAGNOSIS — K7460 Unspecified cirrhosis of liver: Secondary | ICD-10-CM

## 2020-08-02 DIAGNOSIS — R296 Repeated falls: Secondary | ICD-10-CM | POA: Diagnosis not present

## 2020-08-02 DIAGNOSIS — R600 Localized edema: Secondary | ICD-10-CM | POA: Diagnosis not present

## 2020-08-02 DIAGNOSIS — R188 Other ascites: Secondary | ICD-10-CM | POA: Insufficient documentation

## 2020-08-02 DIAGNOSIS — R0600 Dyspnea, unspecified: Secondary | ICD-10-CM | POA: Diagnosis not present

## 2020-08-02 DIAGNOSIS — R748 Abnormal levels of other serum enzymes: Secondary | ICD-10-CM | POA: Diagnosis not present

## 2020-08-02 DIAGNOSIS — H6122 Impacted cerumen, left ear: Secondary | ICD-10-CM

## 2020-08-02 DIAGNOSIS — R0609 Other forms of dyspnea: Secondary | ICD-10-CM | POA: Insufficient documentation

## 2020-08-02 DIAGNOSIS — H6120 Impacted cerumen, unspecified ear: Secondary | ICD-10-CM | POA: Insufficient documentation

## 2020-08-02 LAB — COMPREHENSIVE METABOLIC PANEL
ALT: 40 U/L (ref 0–53)
AST: 56 U/L — ABNORMAL HIGH (ref 0–37)
Albumin: 2.8 g/dL — ABNORMAL LOW (ref 3.5–5.2)
Alkaline Phosphatase: 440 U/L — ABNORMAL HIGH (ref 39–117)
BUN: 15 mg/dL (ref 6–23)
CO2: 31 mEq/L (ref 19–32)
Calcium: 8.2 mg/dL — ABNORMAL LOW (ref 8.4–10.5)
Chloride: 104 mEq/L (ref 96–112)
Creatinine, Ser: 1.48 mg/dL (ref 0.40–1.50)
GFR: 41.18 mL/min — ABNORMAL LOW (ref >60.00)
Glucose, Bld: 90 mg/dL (ref 70–99)
Potassium: 3.9 mEq/L (ref 3.5–5.1)
Sodium: 140 mEq/L (ref 135–145)
Total Bilirubin: 1.2 mg/dL (ref 0.2–1.2)
Total Protein: 6.1 g/dL (ref 6.0–8.3)

## 2020-08-02 LAB — BRAIN NATRIURETIC PEPTIDE: Pro B Natriuretic peptide (BNP): 354 pg/mL — ABNORMAL HIGH (ref 0.0–100.0)

## 2020-08-02 NOTE — Patient Instructions (Signed)
Stop by the lab prior to leaving today. I will notify you of your results once received.   Try using Debrox drops to help soften up your ear wax.  You will be contacted regarding your referral to home health and also for the echocardiogram.  Please let us know if you have not been contacted within two weeks.   It was a pleasure to see you today!

## 2020-08-02 NOTE — Assessment & Plan Note (Signed)
With mild exertion in his home. Given mildly elevated BNP coupled with lower extremity edema will obtain an echocardiogram.  Could also be secondary to deconditioning.  Lungs clear on exam today.  Repeat BNP pending, echocardiogram ordered referral placed to home health for physical therapy.

## 2020-08-02 NOTE — Assessment & Plan Note (Addendum)
Mild elevation in BNP with level of 437. 1+ pitting edema on exam today bilaterally.  Patient endorses no improvement with furosemide. Repeat BNP pending today, will also obtain echo. He will also discuss his swelling with his GI physician as this could be secondary to his liver disease.

## 2020-08-02 NOTE — Assessment & Plan Note (Signed)
Noted to left canal. Patient consented to irrigation. Left canal irrigated, large amounts of firm wax removed. Patient tolerated well.  TM post irrigation unremarkable. Patient symptoms improved immediately.

## 2020-08-02 NOTE — Assessment & Plan Note (Signed)
Increased weakness with multiple falls within the last month.  Recently diagnosed with liver cancer which appears to be mild.  He does have cirrhosis and new lower extremity edema.  Strongly advised to use his cane and walker while in the house and in public.  Referral placed for home health nursing and physical therapy today.

## 2020-08-02 NOTE — Assessment & Plan Note (Signed)
Per MR abdomen from January 2022.  Also with mild ascites and hepatic carcinoma.  Following with GI, repeat hepatic function panel pending. We will also be meeting with radiology to discuss options for hepatic carcinoma.

## 2020-08-02 NOTE — Progress Notes (Signed)
Subjective:    Patient ID: David Rumpf Sr Sr., male    DOB: 16-Jun-1927, 87 y.o.   MRN: [id number]  HPI  This visit occurred during the SARS-CoV-2 public health emergency.  Safety protocols were in place, including screening questions prior to the visit, additional usage of staff PPE, and extensive cleaning of exam room while observing appropriate contact time as indicated for disinfecting solutions.   Mr. David Bradshaw is a 87 year old male with a history of TIA, liver cancer (new diagnosis), hyperlipidemia, cirrhosis of liver, increased LFT's, jaundice who presents today with a chief complaint of lower extremity edema and ear fullness.  1) Lower Extremity Edema: Acute for the last several months, increased gradually in January and February 2022. BNP on 07/23/20 with level of 427. He does not check his daily weights. Treated with furosemide 20 mg BID, finished his last furosemide 20 mg yesterday. He's not noticed much improvement in lower extremity swelling.  History of moderate concentric hypertrophy and echocardiogram from 2017, normal wall motion and systolic function.   2) Liver Cancer/Imbalance: Diagnosed with cirrhosis and liver cancer in January 2022.  Also underwent CBD stone removal 07/17/2020.  Following with GI in [place], due for follow-up in a few weeks with Dr. Havery Moros.  He is due to have repeat LFTs today and is requesting to have them done in our office.  He has noticed improvement in his jaundice, does notice abdominal swelling at times, continues to notice lower extremity edema despite furosemide  He does have exertional shortness of breath mild exertion, generalized weakness, imbalance.  He lives alone, does have family nearby.  He is able to feed and bathe himself, but he is bathing in the sink.  He has noticed imbalance and instability, does not use a cane or walker but has access to both.  He did fall twice within the month of February 2020.  3) Ear Fullness: Noted to  the left ear, feels like "I am in a barrel".  History of cerumen impaction to left ear, he believes this is the case today.  He denies pain.   Wt Readings from Last 3 Encounters:  08/02/20 163 lb (73.9 kg)  07/17/20 150 lb (68 kg)  06/22/20 158 lb (71.7 kg)     Review of Systems  Constitutional: Negative for fever.  Respiratory: Positive for shortness of breath.   Cardiovascular: Positive for leg swelling.  Gastrointestinal: Negative for abdominal pain, nausea and vomiting.        Past Medical History:  Diagnosis Date  . Aortic stenosis    mild AS 01/2016 echo  . Carotid stenosis   . Chicken pox   . Coronary arteriosclerosis due to lipid rich plaque 01/30/2016  . Edema   . Myocardial infarct ([place])    1988 and 1990  . Stroke [facility])      Social History   Socioeconomic History  . Marital status: Married    Spouse name: Not on file  . Number of children: 4  . Years of education: 66  . Highest education level: Not on file  Occupational History  . Occupation: Retired - Optician, dispensing  Tobacco Use  . Smoking status: Former Smoker    Packs/day: 1.00    Years: 35.00    Pack years: 35.00    Quit date: 07/13/1985    Years since quitting: 35.0  . Smokeless tobacco: Never Used  Substance and Sexual Activity  . Alcohol use: No  . Drug use: No  .  Sexual activity: Not on file  Other Topics Concern  . Not on file  Social History Narrative   Fun: Watching TV.    Denies abuse and feels safe at home.    Social Determinants of Health   Financial Resource Strain: Not on file  Food Insecurity: Not on file  Transportation Needs: Not on file  Physical Activity: Not on file  Stress: Not on file  Social Connections: Not on file  Intimate Partner Violence: Not on file    Past Surgical History:  Procedure Laterality Date  . BIOPSY  07/17/2020   Procedure: BIOPSY;  Surgeon: Rush Landmark Telford Nab., MD;  Location: [facility];  Service: Gastroenterology;;  . CARDIAC  CATHETERIZATION N/A 02/04/2016   Procedure: Left Heart Cath and Coronary Angiography;  Surgeon: Jolaine Artist, MD;  Location: [place] [facility];  Service: Cardiovascular;  Laterality: N/A;  . CAROTID ENDARTERECTOMY Right 02/14/2016  . CATARACT EXTRACTION    . ENDARTERECTOMY Right 02/14/2016   Procedure: RIGHT CAROTID ENDARTERECTOMY;  Surgeon: [name], MD;  Location: [place];  Service: Vascular;  Laterality: Right;  . ERCP N/A 07/17/2020   Procedure: ENDOSCOPIC RETROGRADE CHOLANGIOPANCREATOGRAPHY (ERCP);  Surgeon: Irving Copas., MD;  Location: [facility];  Service: Gastroenterology;  Laterality: N/A;  . IR RADIOLOGIST EVAL & MGMT  07/27/2020  . PATCH ANGIOPLASTY Right 02/14/2016   Procedure: [place] X2;  Surgeon: [name], MD;  Location: [facility];  Service: Vascular;  Laterality: Right;  . REMOVAL OF STONES  07/17/2020   Procedure: REMOVAL OF STONES;  Surgeon: Irving Copas., MD;  Location: [facility];  Service: Gastroenterology;;  . Joan Mayans  07/17/2020   Procedure: Joan Mayans;  Surgeon: Irving Copas., MD;  [facility]: [facility];  Service: Gastroenterology;;  . testicle     right hydrocele    Family History  Problem Relation Age of Onset  . Heart attack Mother   . Heart disease Mother   . Bone cancer Father   . Cancer Father     Allergies  Allergen Reactions  . Atorvastatin Rash and Other (See Comments)    Patient started on plavix and lipitor at the same time and developed rash  . Brilinta [Ticagrelor] Hives  . Plavix [Clopidogrel Bisulfate] Rash and Other (See Comments)    Patient started on plavix and lipitor at the same time and developed rash    Current Outpatient Medications on File Prior to Visit  Medication Sig Dispense Refill  . furosemide (LASIX) 20 MG tablet Take 1 or 2 tablets by mouth once daily for five days for leg swelling. 10 tablet 0  . Multiple  Vitamins-Minerals (ICAPS) TABS Take 1 tablet by mouth daily at 12 noon.    Marland Kitchen omeprazole (PRILOSEC) 40 MG capsule Take 1 capsule (40 mg total) by mouth 2 (two) times daily before a meal. 60 capsule 12  . aspirin EC 325 MG tablet Take 1 tablet (325 mg total) by mouth daily. (Patient not taking: Reported on 08/02/2020) 30 tablet 0   No current facility-administered medications on file prior to visit.    BP (!) 100/58   Pulse 62   Temp 97.6 F (36.4 C) (Temporal)   Ht 5' (1.524 m)   Wt 163 lb (73.9 kg)   SpO2 94%   BMI 31.83 kg/m    Objective:   Physical Exam Constitutional:      Appearance: He is well-nourished.  HENT:     Ears:  Comments: Left canal with cerumen impaction Cardiovascular:     Rate and Rhythm: Normal rate and regular rhythm.     Comments: 1+ pitting edema noted bilaterally to lower extremities. Pulmonary:     Effort: Pulmonary effort is normal.     Breath sounds: Normal breath sounds.     Comments: No crackles to bases Abdominal:     General: There is no distension.     Palpations: Abdomen is soft.     Tenderness: There is no abdominal tenderness.     Comments: No obvious ascites, abdomen soft and nontender.  Musculoskeletal:     Cervical back: Neck supple.  Skin:    General: Skin is warm and dry.  Neurological:     Mental Status: He is alert.  Psychiatric:        Mood and Affect: Mood and affect normal.            Assessment & Plan:

## 2020-08-06 DIAGNOSIS — R600 Localized edema: Secondary | ICD-10-CM

## 2020-08-06 NOTE — Progress Notes (Signed)
GI Location of Tumor / Histology: Hepatocellular Carcinoma-  David TRUSS Sr Sr. presented in late December 2021 with jaundice.  Patient states he was having some dull aching stomach pains around Thanksgiving.  ERCP 07/17/2020: Grade 1 distal esophageal varicies.  The esophageal mucosa was also very inflamed.  MRI Abdomen 07/05/2020: Hepatic cirrhosis. 1.6 cm subcapsular mass in the lateral aspect of right lobe, which has characteristics diagnostic for hepatocellular carcinoma.  LI-RADS Category 5  CT 06/04/2020: Evidence of cirrhosis and a roughly 1.5 cm enhancing lesion in the peripheral subcapsular right lobe of the liver suspicious for hepatocellular carcinoma.  Biopsies of GE junction, stomach, and duodenum 07/17/2020    Past/Anticipated interventions by IR, if any: Dr. Kathlene Cote 07/27/2020  -We discussed treatment options for the small hepatocellular carcinoma which by my measurements on MRI measures approximately 1.4 x 1.6 cm.  AFP is elevated and imaging features are consistent with a definite HCC by MRI.  Treatment options include percutaneous thermal ablation, transcatheter chemoembolization versus radioembolization, stereotactic radiation therapy or observation without current treatment. - I do not think that he is a candidate for thermal ablation due to his poor performance status currently and poor recovery after general anesthesia from his ERCP.  -It appears that his performance status has declined quite severely just in the last 2 weeks.  -I told David Bradshaw and his family that the least invasive treatment option to consider would be stereotactic radiation therapy.  The lesion is small and certainly not large enough to currently cause any significant symptoms or problems and could also be observed for a while given his advanced age.  -After discussion of treatment options, David Bradshaw and his family would like to speak to Radiation Oncology about potential candidacy for stereotactic  radiation therapy.   Past/Anticipated interventions by surgeon, if any:   Past/Anticipated interventions by medical oncology, if any:    Weight changes, if any: stable, no weight loss per family.  Bowel/Bladder complaints, if any: Bowel and bladder habits have gotten back to normal since having the gall stone removed.  Nausea / Vomiting, if any: No  Pain issues, if any:  no  Appetite: good.    SAFETY ISSUES:  Prior radiation? No  Pacemaker/ICD? No  Possible current pregnancy? n/a  Is the patient on methotrexate? No  Current Complaints/Details: -Family states the swelling in his legs started about the same time as the jaundice and abdominal pain.  This has been stable for about the last month but has not fully resolved. He was given a 5 day supply of Lasix.

## 2020-08-07 ENCOUNTER — Ambulatory Visit
Admission: RE | Admit: 2020-08-07 | Discharge: 2020-08-07 | Disposition: A | Discharge status: Home / Self Care | Payer: Medicare HMO | Source: Ambulatory Visit | Attending: Radiation Oncology | Admitting: Radiation Oncology

## 2020-08-07 ENCOUNTER — Encounter: Payer: Self-pay | Admitting: Radiation Oncology

## 2020-08-07 ENCOUNTER — Other Ambulatory Visit: Payer: Self-pay

## 2020-08-07 VITALS — Ht 60.0 in | Wt 163.0 lb

## 2020-08-07 DIAGNOSIS — C220 Liver cell carcinoma: Secondary | ICD-10-CM | POA: Diagnosis not present

## 2020-08-07 NOTE — Progress Notes (Signed)
Radiation Oncology         ([phone]) [phone] ________________________________  Initial Outpatient Consultation - Conducted via telephone due to current COVID-19 concerns for limiting patient exposure  I spoke with the patient to conduct this consult visit via telephone to spare the patient unnecessary potential exposure in the healthcare setting during the current COVID-19 pandemic. The patient was notified in advance and was offered a [place] meeting to allow for face to face communication but unfortunately reported that they did not have the appropriate resources/technology to support such a visit and instead preferred to proceed with a telephone consult.   Name: David Bradshaw Sr Sr.        MRN: [id number]  Date of Service: 08/07/2020 DOB: Aug 09, 1926  BZ:JIRCV, Leticia Penna, NP  Aletta Edouard, MD     REFERRING PHYSICIAN: Aletta Edouard, MD   DIAGNOSIS: The encounter diagnosis was Hepatocellular carcinoma [facility]).   HISTORY OF PRESENT ILLNESS: SKYLAR PRIEST Sr Sr. is a 87 y.o. male seen at the request of Dr. Kathlene Cote for a history of hepatocellular carcinoma.  The patient originally was found to have elevations in his liver functions and imaging to work this up showed cirrhosis as well as a roughly noted 1.5 cm enhancing lesion in the subcapsular right lobe of the liver with adjacent ascites and a 1.5 cm infrarenal abdominal aortic aneurysm, and MRI of the abdomen on 07/05/2020 demonstrated imaging consistencies with an L RADS 5 lesion in the subcapsular right lobe of the liver measuring 1.6 cm, he underwent ERCP on 07/17/2020 having grade 1 distal esophageal varices and choledocholithiasis was found with sludge and a calculus removed after sphincterotomy and balloon sweep.  He was seen to consider options of ablation versus chemoembolization, and he was not a good candidate for thermal ablation due to poor performance status and poor recovery after general anesthesia from an ERCP, his status  apparently had declined significantly in the 2 weeks prior to seeing Dr. Kathlene Cote on 07/27/2020, his serum creatinine of 1.4 and GFR of 43.7 also prevented additional consideration of IV contrast.  He is seen today to discuss stereotactic body radiotherapy as an alternative.     PREVIOUS RADIATION THERAPY: No   PAST MEDICAL HISTORY:  Past Medical History:  Diagnosis Date  . Aortic stenosis    mild AS 01/2016 echo  . Carotid stenosis   . Chicken pox   . Coronary arteriosclerosis due to lipid rich plaque 01/30/2016  . Edema   . Hepatocellular carcinoma ([place]) 07/05/2020  . Myocardial infarct ([place])    1988 and 1990  . Stroke [facility])        PAST SURGICAL HISTORY: Past Surgical History:  Procedure Laterality Date  . BIOPSY  07/17/2020   Procedure: BIOPSY;  Surgeon: Rush Landmark Telford Nab., MD;  Location: [facility];  Service: Gastroenterology;;  . CARDIAC CATHETERIZATION N/A 02/04/2016   Procedure: Left Heart Cath and Coronary Angiography;  Surgeon: Jolaine Artist, MD;  Location: [place] [facility];  Service: Cardiovascular;  Laterality: N/A;  . CAROTID ENDARTERECTOMY Right 02/14/2016  . CATARACT EXTRACTION    . ENDARTERECTOMY Right 02/14/2016   Procedure: RIGHT CAROTID ENDARTERECTOMY;  Surgeon: [name], MD;  Location: [facility];  Service: Vascular;  Laterality: Right;  . ERCP N/A 07/17/2020   Procedure: ENDOSCOPIC RETROGRADE CHOLANGIOPANCREATOGRAPHY (ERCP);  Surgeon: Irving Copas., MD;  Location: [facility];  Service: Gastroenterology;  Laterality: N/A;  . IR RADIOLOGIST EVAL & MGMT  07/27/2020  . PATCH ANGIOPLASTY Right 02/14/2016  Procedure: [place] X2;  Surgeon: [name], MD;  Location: [place];  Service: Vascular;  Laterality: Right;  . REMOVAL OF STONES  07/17/2020   Procedure: REMOVAL OF STONES;  Surgeon: Irving Copas., MD;  Location: [facility];  Service: Gastroenterology;;  .  Joan Mayans  07/17/2020   Procedure: Joan Mayans;  Surgeon: Irving Copas., MD;  [facility];  Service: Gastroenterology;;  . testicle     right hydrocele     FAMILY HISTORY:  Family History  Problem Relation Age of Onset  . Heart attack Mother   . Heart disease Mother   . Bone cancer Father   . Cancer Father      SOCIAL HISTORY:  reports that he quit smoking about 35 years ago. He has a 35.00 pack-year smoking history. He has never used smokeless tobacco. He reports that he does not drink alcohol and does not use drugs. The patient is widowed and lives in [place]. He has two adult sons and an adult daughter who help him with transportation, grocery shopping, medications, and bill paying.    ALLERGIES: Atorvastatin, Brilinta [ticagrelor], and Plavix [clopidogrel bisulfate]   MEDICATIONS:  Current Outpatient Medications  Medication Sig Dispense Refill  . aspirin EC 325 MG tablet Take 1 tablet (325 mg total) by mouth daily. 30 tablet 0  . Multiple Vitamins-Minerals (ICAPS) TABS Take 1 tablet by mouth daily at 12 noon.    Marland Kitchen omeprazole (PRILOSEC) 40 MG capsule Take 1 capsule (40 mg total) by mouth 2 (two) times daily before a meal. 60 capsule 12  . furosemide (LASIX) 20 MG tablet Take 1 or 2 tablets by mouth once daily for five days for leg swelling. (Patient not taking: Reported on 08/07/2020) 10 tablet 0   No current facility-administered medications for this encounter.     REVIEW OF SYSTEMS: On review of systems, the patient reports that he is doing okay overall. He has developed some swelling of his lower extremities in the last two months and is getting worked up by his PCP for this.  He does have weakness in his lower extremities and struggles with walking so the family is awaiting a PT evaluation for home assessment. He does have macular degeneration and struggles with his sight but has still remained able to live on his own at home. His two sons and  daughter check in on him regularly and take him to appointments, do his laundry and grocery shopping. He has not had any pain in the abdomen recently but had some pain in his abdomen that was a dull ache around Thanksgiving. He has felt a little better since having the MRCP with his appetite improving. He did have jaundice around the time of that, which has since resolved as well. No other bowel, bladder, or digestive symptoms are noted. No other complaints are verbalized.    PHYSICAL EXAM:  Wt Readings from Last 3 Encounters:  08/07/20 163 lb (73.9 kg)  08/02/20 163 lb (73.9 kg)  07/17/20 150 lb (68 kg)    Pain Assessment Pain Score: 0-No pain/10  Unable to assess due to encounter type.  ECOG = 1  0 - Asymptomatic (Fully active, able to carry on all predisease activities without restriction)  1 - Symptomatic but completely ambulatory (Restricted in physically strenuous activity but ambulatory and able to carry out work of a light or sedentary nature. For example, light housework, office work)  2 - Symptomatic, <50% in bed during  the day (Ambulatory and capable of all self care but unable to carry out any work activities. Up and about more than 50% of waking hours)  3 - Symptomatic, >50% in bed, but not bedbound (Capable of only limited self-care, confined to bed or chair 50% or more of waking hours)  4 - Bedbound (Completely disabled. Cannot carry on any self-care. Totally confined to bed or chair)  5 - Death   Eustace Pen MM, Creech RH, Tormey DC, et al. [phone]). "Toxicity and response criteria of the [facility]". [place] Oncol. 5 (6): 649-55    LABORATORY DATA:  Lab Results  Component Value Date   WBC 12.0 (H) 07/13/2020   HGB 17.3 (H) 07/13/2020   HCT 52.2 (H) 07/13/2020   MCV 100.0 07/13/2020   PLT 186.0 07/13/2020   Lab Results  Component Value Date   NA 140 08/02/2020   K 3.9 08/02/2020   CL 104 08/02/2020   CO2 31 08/02/2020   Lab Results   Component Value Date   ALT 40 08/02/2020   AST 56 (H) 08/02/2020   ALKPHOS 440 (H) 08/02/2020   BILITOT 1.2 08/02/2020      RADIOGRAPHY: DG ERCP BILIARY & PANCREATIC DUCTS  Result Date: 07/17/2020 CLINICAL DATA:  87 year old male undergoing ERCP and stone removal EXAM: ERCP TECHNIQUE: Multiple spot images obtained with the fluoroscopic device and submitted for interpretation post-procedure. FLUOROSCOPY TIME:  Fluoroscopy Time:  1 minutes 35 seconds Number of Acquired Spot Images: 0 COMPARISON:  MRCP 07/05/2020 FINDINGS: Total of 12 saved images are submitted for review. The images demonstrate a flexible duodenal scope in the descending duodenum followed by wire cannulation of the common bile duct. Subsequent cholangiogram demonstrates filling defects in the distal common bile duct consistent with choledocholithiasis. Subsequent images confirm sphincterotomy and balloon sweeping of the common duct. IMPRESSION: 1. Choledocholithiasis. 2. ERCP with sphincterotomy and balloon sweeping of the common duct. These images were submitted for radiologic interpretation only. Please see the procedural report for the amount of contrast and the fluoroscopy time utilized. Electronically Signed   By: Jacqulynn Cadet M.D.   On: 07/17/2020 15:13   IR Radiologist Eval & Mgmt  Result Date: 07/30/2020 Please refer to notes tab for details about interventional procedure. (Op Note)      IMPRESSION/PLAN: 1. Hepatocellular Carcinoma of the right liver. Dr. Lisbeth Renshaw discusses the pathology findings and reviews the nature of primary liver disease. He is not a good candidate for invasive procedures for ablation or embolization type therapy. He would be a candidate for stereotactic body radiotherapy (SBRT) and Dr. Lisbeth Renshaw discusses that he could proceed without fiducial markers in order to offer therapy and also without IV contrast since we would fuse his images from his prior MRI. We discussed the risks, benefits, short, and  long term effects of radiotherapy, as well as the curative intent, and the patient is interested in having repeat imaging 3-6 months from his last MRI prior to making a decision while he works on other medical issues. Dr. Lisbeth Renshaw is in agreement with this, and recommends repeating the scan 3-4 months from the last. We will coordinate this for him as well. We did discuss that if we were to proceed, Dr. Lisbeth Renshaw would recommend 5 fxns of SBRT style therapy. They will call us sooner if they wish to move forward sooner, otherwise I will call them in a few months to review his next MRI and coordinate next steps.     Given current  concerns for patient exposure during the COVID-19 pandemic, this encounter was conducted via telephone.  The patient has provided two factor identification and has given verbal consent for this type of encounter and has been advised to only accept a meeting of this type in a secure network environment. The time spent during this encounter was 60 minutes including preparation, discussion, and coordination of the patient's care. The attendants for this meeting include Blenda Nicely, RN, Dr. Lisbeth Renshaw, Hayden Pedro  and [place]. along with his two sons [name] and Jaydrien Wassenaar. During the encounter,  Blenda Nicely, RN, Dr. Lisbeth Renshaw, and Hayden Pedro were located at [facility] Radiation Oncology Department.  David Bradshaw Sr Sr. was located at home with his sons [name] and Kevyn Boquet.    The above documentation reflects my direct findings during this shared patient visit. Please see the separate note by Dr. Lisbeth Renshaw on this date for the remainder of the patient's plan of care.    Carola Rhine, [facility]   **Disclaimer: This note was dictated with voice recognition software. Similar sounding words can inadvertently be transcribed and this note may contain transcription errors which may not have been corrected upon publication of note.**

## 2020-08-07 NOTE — Addendum Note (Signed)
Addended by: Kris Mouton on: 08/07/2020 08:40 AM   Modules accepted: Orders

## 2020-08-08 MED ORDER — FUROSEMIDE 20 MG PO TABS
20.0000 mg | ORAL_TABLET | Freq: Two times a day (BID) | ORAL | 0 refills | Status: DC
Start: 2020-08-08 — End: 2020-08-21

## 2020-08-10 ENCOUNTER — Telehealth: Payer: Self-pay

## 2020-08-10 DIAGNOSIS — Z8673 Personal history of transient ischemic attack (TIA), and cerebral infarction without residual deficits: Secondary | ICD-10-CM | POA: Diagnosis not present

## 2020-08-10 DIAGNOSIS — N183 Chronic kidney disease, stage 3 unspecified: Secondary | ICD-10-CM | POA: Diagnosis not present

## 2020-08-10 DIAGNOSIS — I350 Nonrheumatic aortic (valve) stenosis: Secondary | ICD-10-CM | POA: Diagnosis not present

## 2020-08-10 DIAGNOSIS — K7460 Unspecified cirrhosis of liver: Secondary | ICD-10-CM | POA: Diagnosis not present

## 2020-08-10 DIAGNOSIS — C229 Malignant neoplasm of liver, not specified as primary or secondary: Secondary | ICD-10-CM | POA: Diagnosis not present

## 2020-08-10 DIAGNOSIS — H538 Other visual disturbances: Secondary | ICD-10-CM | POA: Diagnosis not present

## 2020-08-10 DIAGNOSIS — E785 Hyperlipidemia, unspecified: Secondary | ICD-10-CM | POA: Diagnosis not present

## 2020-08-10 DIAGNOSIS — R600 Localized edema: Secondary | ICD-10-CM | POA: Diagnosis not present

## 2020-08-10 DIAGNOSIS — I252 Old myocardial infarction: Secondary | ICD-10-CM | POA: Diagnosis not present

## 2020-08-10 DIAGNOSIS — R296 Repeated falls: Secondary | ICD-10-CM | POA: Diagnosis not present

## 2020-08-10 NOTE — Telephone Encounter (Signed)
Pam nurse for [facility] left v/m requesting verbal orders for [facility] skilled nursing 1 x a wk for 1 wk; 2 x a wk for 2 wks; 1 x a wk for 1 wk; 1 x a month for 1 month; and 2 prn visits for complications related to neoplasm of liver, gallstones and edema bilateral lower extremities. Also request ADI to determine for support of lower extremity to keep fluid from gathering.

## 2020-08-10 NOTE — Telephone Encounter (Signed)
Approve

## 2020-08-13 DIAGNOSIS — N183 Chronic kidney disease, stage 3 unspecified: Secondary | ICD-10-CM | POA: Diagnosis not present

## 2020-08-13 DIAGNOSIS — H538 Other visual disturbances: Secondary | ICD-10-CM | POA: Diagnosis not present

## 2020-08-13 DIAGNOSIS — R296 Repeated falls: Secondary | ICD-10-CM | POA: Diagnosis not present

## 2020-08-13 DIAGNOSIS — I252 Old myocardial infarction: Secondary | ICD-10-CM | POA: Diagnosis not present

## 2020-08-13 DIAGNOSIS — K7460 Unspecified cirrhosis of liver: Secondary | ICD-10-CM | POA: Diagnosis not present

## 2020-08-13 DIAGNOSIS — E785 Hyperlipidemia, unspecified: Secondary | ICD-10-CM | POA: Diagnosis not present

## 2020-08-13 DIAGNOSIS — I350 Nonrheumatic aortic (valve) stenosis: Secondary | ICD-10-CM | POA: Diagnosis not present

## 2020-08-13 DIAGNOSIS — C229 Malignant neoplasm of liver, not specified as primary or secondary: Secondary | ICD-10-CM | POA: Diagnosis not present

## 2020-08-13 DIAGNOSIS — Z8673 Personal history of transient ischemic attack (TIA), and cerebral infarction without residual deficits: Secondary | ICD-10-CM | POA: Diagnosis not present

## 2020-08-13 DIAGNOSIS — R600 Localized edema: Secondary | ICD-10-CM | POA: Diagnosis not present

## 2020-08-13 NOTE — Telephone Encounter (Signed)
Called and spoke Darlene verbal given will call if any questions.

## 2020-08-14 ENCOUNTER — Other Ambulatory Visit: Payer: Self-pay

## 2020-08-14 ENCOUNTER — Encounter: Payer: Self-pay | Admitting: Gastroenterology

## 2020-08-14 ENCOUNTER — Ambulatory Visit: Payer: Medicare HMO | Admitting: Gastroenterology

## 2020-08-14 ENCOUNTER — Telehealth: Payer: Self-pay | Admitting: *Deleted

## 2020-08-14 ENCOUNTER — Other Ambulatory Visit (INDEPENDENT_AMBULATORY_CARE_PROVIDER_SITE_OTHER): Payer: Medicare HMO

## 2020-08-14 VITALS — BP 110/60 | HR 104 | Ht 60.0 in | Wt 164.0 lb

## 2020-08-14 DIAGNOSIS — R609 Edema, unspecified: Secondary | ICD-10-CM | POA: Diagnosis not present

## 2020-08-14 DIAGNOSIS — C220 Liver cell carcinoma: Secondary | ICD-10-CM

## 2020-08-14 DIAGNOSIS — Z23 Encounter for immunization: Secondary | ICD-10-CM

## 2020-08-14 DIAGNOSIS — K209 Esophagitis, unspecified without bleeding: Secondary | ICD-10-CM | POA: Diagnosis not present

## 2020-08-14 DIAGNOSIS — K8050 Calculus of bile duct without cholangitis or cholecystitis without obstruction: Secondary | ICD-10-CM

## 2020-08-14 DIAGNOSIS — K259 Gastric ulcer, unspecified as acute or chronic, without hemorrhage or perforation: Secondary | ICD-10-CM

## 2020-08-14 DIAGNOSIS — K7460 Unspecified cirrhosis of liver: Secondary | ICD-10-CM

## 2020-08-14 LAB — COMPREHENSIVE METABOLIC PANEL
ALT: 35 U/L (ref 0–53)
AST: 49 U/L — ABNORMAL HIGH (ref 0–37)
Albumin: 3 g/dL — ABNORMAL LOW (ref 3.5–5.2)
Alkaline Phosphatase: 449 U/L — ABNORMAL HIGH (ref 39–117)
BUN: 18 mg/dL (ref 6–23)
CO2: 29 mEq/L (ref 19–32)
Calcium: 8.4 mg/dL (ref 8.4–10.5)
Chloride: 104 mEq/L (ref 96–112)
Creatinine, Ser: 1.36 mg/dL (ref 0.40–1.50)
GFR: 45.56 mL/min — ABNORMAL LOW (ref >60.00)
Glucose, Bld: 99 mg/dL (ref 70–99)
Potassium: 3.8 mEq/L (ref 3.5–5.1)
Sodium: 142 mEq/L (ref 135–145)
Total Bilirubin: 1.1 mg/dL (ref 0.2–1.2)
Total Protein: 6.4 g/dL (ref 6.0–8.3)

## 2020-08-14 NOTE — Telephone Encounter (Signed)
Called l/m on v/m for verbal orders.

## 2020-08-14 NOTE — Telephone Encounter (Signed)
Mark PT with [facility] left a voicemail stating that he is requesting PT home orders starting 08/13/20 for patient. Elta Guadeloupe stated that he is requesting once a week for one week, two times a week for four weeks and once a week for one week. Elta Guadeloupe stated when calling back you can leave the verbal order on his voicemail.

## 2020-08-14 NOTE — Patient Instructions (Addendum)
If you are age 85 or older, your body mass index should be between 23-30. Your Body mass index is 32.03 kg/m. If this is out of the aforementioned range listed, please consider follow up with your Primary Care Provider.  If you are age 46 or younger, your body mass index should be between 19-25. Your Body mass index is 32.03 kg/m. If this is out of the aformentioned range listed, please consider follow up with your Primary Care Provider.   Please go to the lab in the basement of our building to have lab work done as you leave today. Hit "B" for basement when you get on the elevator.  When the doors open the lab is on your left.  We will call you with the results. Thank you.  Due to recent changes in healthcare laws, you may see the results of your imaging and laboratory studies on MyChart before your provider has had a chance to review them.  We understand that in some cases there may be results that are confusing or concerning to you. Not all laboratory results come back in the same time frame and the provider may be waiting for multiple results in order to interpret others.  Please give [place] 48 hours in order for your provider to thoroughly review all the results before contacting the office for clarification of your results.    We are giving you a Twinrix injection today that will protect you against Hepatitis A and Hepatitis B. You will need to come for your 2nd of 3 injections in 1 month. We have scheduled an appointment on Monday, 4-4 at 2:45pm. Please call to reschedule this appointment as soon as possible if you need to but try to stay as close to possible to this date. Thank you.  Continue omeprazole twice a day.  Thank you for entrusting me with your care and for choosing [facility], Dr.  Cellar

## 2020-08-14 NOTE — Telephone Encounter (Signed)
Approved.  

## 2020-08-14 NOTE — Progress Notes (Signed)
HPI :  87 year old male here for follow-up visit for multiple GI issues including history of choledocholithiasis, cirrhosis, HCC, gastric ulcers, esophagitis.  See prior notes for details of his case.  He presented to [place] for a routine outpatient office consultation in January with jaundice.  He underwent serologic work-up and imaging.  Eventually had an MRCP showing gallstones in the gallbladder with a dilated bile duct and choledocholithiasis.  Incidentally also was noted to have evidence of cirrhosis with a small suspected HCC.  He underwent elective ERCP with Dr. Rush Landmark on February 1.  He had removal of stone from the CBD and sphincterotomy performed.  The patient tolerated the procedure well and has had improving liver enzymes since that time although they had not normalized on last blood draw on 217.  Given his age and comorbidities he has not undergone cholecystectomy, he has multiple stones noted in his gallbladder previously.  He denies any abdominal pain, no jaundice.  He is eating quite well.  His main complaint is lower extremity edema and some shortness of breath with exertion.  Given he had findings of Cope on MRI his case was discussed at the cancer conference.  He is not a good surgical candidate given his age, not a transplant candidate, it was recommended he be referred to IR to discuss options they can offer.  He spoke with Dr. Kathlene Cote of IR and was referred to Dr. Lisbeth Renshaw to discuss possible radiation therapy in case the lesion grows.  Their plan is interval imaging at this point time which the patient and son were agreeable with.  Patient and son deny any history of alcohol use.  He denies any history of known liver disease.  His work-up has included negative testing hepatitis B and C, negative testing for autoimmune hepatitis and PBC, well celiac disease.  His ferritin level was elevated in the setting of choledocholithiasis, that has not been repeated.  He has no decompensation  from cirrhosis that we are aware of.  Incidentally noted during his EGD he had multiple gastric ulcers as well as severe esophagitis, with concern for possible underlying malignancy at the distal esophagus.  Biopsies were taken and negative for dysplasia or malignancy.  He tested negative for H. pylori.  He does take a regular aspirin every day for history of TIA.  He was placed on Protonix 40 mg twice daily.  He denies any blood in his stools.  Denies any abdominal pain.  Denies any dysphagia or odynophagia.  Dr. Rush Landmark had recommended a follow-up endoscopy in light of the esophageal findings.  The patient in the interim has had some lower extremity edema that has bothered him as well as some shortness of breath.  He had a BNP that was elevated to 354.  He has been on Lasix 20 mg once to twice daily by his primary care.  He has an echocardiogram pending on March 14.  He reports he is gained about 10 pounds and "water weight" over the past several weeks.  Denies shortness of breath only when he ambulates or exerts himself.  At baseline he has no breathing problems and feels okay.  He does have a history of grade 3 diastolic dysfunction and aortic stenosis.  He also has a history of abdominal aortic aneurysm.    Prior workup: MRCP 07/05/20 - IMPRESSION: Hepatic cirrhosis. 1.6 cm subcapsular mass in the lateral aspect of right lobe, which has characteristics diagnostic for hepatocellular carcinoma. (LI-RADS Category 5: Definitely HCC) Mild biliary ductal  dilatation, with 7 mm calculus in distal common bile duct. Cholelithiasis. No radiographic evidence of cholecystitis.  5.4 cm infrarenal abdominal aortic aneurysm. Recommend follow-up CT/MR every 6 months and vascular consultation. This recommendation follows ACR consensus guidelines: White Paper of the ACR Incidental Findings Committee II on Vascular Findings. J Am Coll Radiol 2013; 10:789-794.  ERCP 07/17/20 -  - No gross lesions in esophagus  proximally. - Grade I esophageal varices distally. - Congested, erythematous, friable (with spontaneous bleeding), granular, inflamed, texture changed, ulcerated mucosa in the esophagus - concern for severe esophagitis vs potential for underlying malignancy - biopsied. - J-shaped deformity in the entire stomach. - Gastritis. Non-bleeding gastric ulcers with a clean ulcer base (Forrest Class III). Biopsied for HP evaluation. - Duodenitis. Non-bleeding duodenal ulcers with a clean ulcer base (Forrest Class III). Biopsied for HP evaluation. - The major papilla appeared normal. - A filling defect consistent with a stone was seen on the cholangiogram. - The entire main bile duct was moderately dilated from the stone. - Choledocholithiasis was found. Complete removal was accomplished by biliary sphincterotomy and balloon sweep.  FINAL MICROSCOPIC DIAGNOSIS:   A. DUODENUM, BIOPSY:  - Duodenal mucosa with reactive changes  - Negative for increased intraepithelial lymphocytes or villous  architectural changes   B. STOMACH, BIOPSY:  - Gastric antral and oxyntic mucosa with mild chronic gastritis  - Warthin Starry stain is negative for Helicobacter pylori   C. EG JUNCTION, BIOPSY:  - Esophageal squamous and cardiac mucosa with active chronic nonspecific  cardioesophagitis  - Negative for intestinal metaplasia or dysplasia       Past Medical History:  Diagnosis Date  . Aortic stenosis    mild AS 01/2016 echo  . Carotid stenosis   . Chicken pox   . Choledocholithiasis   . Cirrhosis ([place])   . Coronary arteriosclerosis due to lipid rich plaque 01/30/2016  . Edema   . Esophagitis   . Gastric ulcer   . Hepatocellular carcinoma ([place]) 07/05/2020  . Myocardial infarct ([place])    1988 and 1990  . Stroke [facility])      Past Surgical History:  Procedure Laterality Date  . BIOPSY  07/17/2020   Procedure: BIOPSY;  Surgeon: Rush Landmark Telford Nab., MD;  Location: [facility];  Service:  Gastroenterology;;  . CARDIAC CATHETERIZATION N/A 02/04/2016   Procedure: Left Heart Cath and Coronary Angiography;  Surgeon: Jolaine Artist, MD;  Location: [facility];  Service: Cardiovascular;  Laterality: N/A;  . CAROTID ENDARTERECTOMY Right 02/14/2016  . CATARACT EXTRACTION    . ENDARTERECTOMY Right 02/14/2016   Procedure: RIGHT CAROTID ENDARTERECTOMY;  Surgeon: [name], MD;  Location: [facility];  Service: Vascular;  Laterality: Right;  . ERCP N/A 07/17/2020   Procedure: ENDOSCOPIC RETROGRADE CHOLANGIOPANCREATOGRAPHY (ERCP);  Surgeon: Irving Copas., MD;  Location: [facility];  Service: Gastroenterology;  Laterality: N/A;  . IR RADIOLOGIST EVAL & MGMT  07/27/2020  . PATCH ANGIOPLASTY Right 02/14/2016   Procedure: [place] X2;  Surgeon: [name], MD;  Location: [facility];  Service: Vascular;  Laterality: Right;  . REMOVAL OF STONES  07/17/2020   Procedure: REMOVAL OF STONES;  Surgeon: Irving Copas., MD;  Location: [facility];  Service: Gastroenterology;;  . Joan Mayans  07/17/2020   Procedure: Joan Mayans;  Surgeon: Irving Copas., MD;  Location: [facility];  Service: Gastroenterology;;  . testicle     right hydrocele   Family History  Problem Relation Age of Onset  .  Heart attack Mother   . Heart disease Mother   . Bone cancer Father   . Cancer Father    Social History   Tobacco Use  . Smoking status: Former Smoker    Packs/day: 1.00    Years: 35.00    Pack years: 35.00    Quit date: 07/13/1985    Years since quitting: 35.1  . Smokeless tobacco: Never Used  Vaping Use  . Vaping Use: Never used  Substance Use Topics  . Alcohol use: No  . Drug use: No   Current Outpatient Medications  Medication Sig Dispense Refill  . aspirin EC 325 MG tablet Take 1 tablet (325 mg total) by mouth daily. 30 tablet 0  . furosemide (LASIX) 20 MG tablet Take 1 tablet (20 mg  total) by mouth 2 (two) times daily. For leg swelling. 10 tablet 0  . Multiple Vitamins-Minerals (ICAPS) TABS Take 1 tablet by mouth daily at 12 noon.    Marland Kitchen omeprazole (PRILOSEC) 40 MG capsule Take 1 capsule (40 mg total) by mouth 2 (two) times daily before a meal. 60 capsule 12   No current facility-administered medications for this visit.   Allergies  Allergen Reactions  . Atorvastatin Rash and Other (See Comments)    Patient started on plavix and lipitor at the same time and developed rash  . Brilinta [Ticagrelor] Hives  . Plavix [Clopidogrel Bisulfate] Rash and Other (See Comments)    Patient started on plavix and lipitor at the same time and developed rash     Review of Systems: All systems reviewed and negative except where noted in HPI.    DG ERCP BILIARY & PANCREATIC DUCTS  Result Date: 07/17/2020 CLINICAL DATA:  87 year old male undergoing ERCP and stone removal EXAM: ERCP TECHNIQUE: Multiple spot images obtained with the fluoroscopic device and submitted for interpretation post-procedure. FLUOROSCOPY TIME:  Fluoroscopy Time:  1 minutes 35 seconds Number of Acquired Spot Images: 0 COMPARISON:  MRCP 07/05/2020 FINDINGS: Total of 12 saved images are submitted for review. The images demonstrate a flexible duodenal scope in the descending duodenum followed by wire cannulation of the common bile duct. Subsequent cholangiogram demonstrates filling defects in the distal common bile duct consistent with choledocholithiasis. Subsequent images confirm sphincterotomy and balloon sweeping of the common duct. IMPRESSION: 1. Choledocholithiasis. 2. ERCP with sphincterotomy and balloon sweeping of the common duct. These images were submitted for radiologic interpretation only. Please see the procedural report for the amount of contrast and the fluoroscopy time utilized. Electronically Signed   By: Jacqulynn Cadet M.D.   On: 07/17/2020 15:13   IR Radiologist Eval & Mgmt  Result Date:  07/30/2020 Please refer to notes tab for details about interventional procedure. (Op Note)  Lab Results  Component Value Date   WBC 12.0 (H) 07/13/2020   HGB 17.3 (H) 07/13/2020   HCT 52.2 (H) 07/13/2020   MCV 100.0 07/13/2020   PLT 186.0 07/13/2020    Lab Results  Component Value Date   CREATININE 1.48 08/02/2020   BUN 15 08/02/2020   NA 140 08/02/2020   K 3.9 08/02/2020   CL 104 08/02/2020   CO2 31 08/02/2020    Lab Results  Component Value Date   ALT 40 08/02/2020   AST 56 (H) 08/02/2020   ALKPHOS 440 (H) 08/02/2020   BILITOT 1.2 08/02/2020     Physical Exam: BP 110/60 (BP Location: Left Arm, Patient Position: Sitting)   Pulse (!) 104   Ht 5' (1.524 m)   [facility]  164 lb (74.4 kg)   SpO2 94%   BMI 32.03 kg/m  Constitutional: Pleasant,well-developed, male in no acute distress. Abdominal: Soft, nondistended, nontender.  There are no masses palpable. Extremities: (+) 1-2 pitting edema B LE Lymphadenopathy: No cervical adenopathy noted. Neurological: Alert and oriented to person place and time. Skin: Skin is warm and dry. No rashes noted. Psychiatric: Normal mood and affect. Behavior is normal.   ASSESSMENT AND PLAN: 87 year old male here for reassessment of the following issues:  Choledocholithiasis - I believe this is the cause for his jaundice most recently as opposed to cirrhosis or HCC.  He is status post successful ERCP with Dr. Rush Landmark, stone extraction and sphincterotomy.  He has recovered okay from this.  We will repeat his LFTs to see where they are trending at this point time.  Given his age and comorbidities, holding off on cholecystectomy at this time. They understand there is risk for recurrent choledocholithiasis in this setting but hopefully risk is lower with sphincterotomy.  Cirrhosis / [place] - incidentally noted to have cirrhosis on imaging as well as a small HCC.  His case was discussed at multidisciplinary cancer conference, who referred to IR we  discussed options with him.  He is also seeing radiation oncology to discuss possible radiation if needed.  For now they are recommending follow-up interval imaging for the [facility] in light of his comorbidities and age, hopefully this is stable with time but we will see how he does.  They are comfortable with this plan they want to avoid aggressive measures right now.  It is unclear why he has cirrhosis at this point time, unclear if due to fatty liver perhaps.  Initial serologic work-up negative.  I will repeat iron studies to see if they have improved now that he has had choledocholithiasis treated.  He is not immune to hep A and will give vaccine for that today.  He otherwise seems compensated, small varices noted on EGD.  Will see him every 6 months for the cirrhosis.  Gastric ulcer / esophagitis / edema - noted incidentally during ERCP.  He takes aspirin routinely without GI prophylaxis and that is the likely cause in the setting of biopsies negative for H. pylori.  He takes aspirin daily in light of history of TIA and plans on continuing that.  On omeprazole 40 mg twice daily now, which we will continue.  Dr. Rush Landmark had recommended a follow-up EGD to ensure no evidence of malignancy in the lower esophagus based on endoscopic appearance.  Biopsies showed no evidence of that which is reassuring.  Repeating an endoscopy is reasonable ton consider in this light however the patient has since developed lower extremity edema and worsening shortness of breath.  I do think that he warrants an echocardiogram first to make sure his heart is stable prior to considering any endoscopic intervention, at higher than average risk for anesthesia.  Patient and son are agreeable with the plan, echo is scheduled to be done in 2 weeks.  We will otherwise check his renal function today, consider escalation of diuretics if that is stable while we await echocardiogram.  They agreed with the plan, all questions answered, further  recommendations pending results of his lab work.  Carlos Cellar, MD [facility]

## 2020-08-15 LAB — FERRITIN: Ferritin: 250.4 ng/mL (ref 22.0–322.0)

## 2020-08-17 DIAGNOSIS — K7460 Unspecified cirrhosis of liver: Secondary | ICD-10-CM | POA: Diagnosis not present

## 2020-08-17 DIAGNOSIS — I350 Nonrheumatic aortic (valve) stenosis: Secondary | ICD-10-CM | POA: Diagnosis not present

## 2020-08-17 DIAGNOSIS — R296 Repeated falls: Secondary | ICD-10-CM | POA: Diagnosis not present

## 2020-08-17 DIAGNOSIS — E785 Hyperlipidemia, unspecified: Secondary | ICD-10-CM | POA: Diagnosis not present

## 2020-08-17 DIAGNOSIS — Z8673 Personal history of transient ischemic attack (TIA), and cerebral infarction without residual deficits: Secondary | ICD-10-CM | POA: Diagnosis not present

## 2020-08-17 DIAGNOSIS — H538 Other visual disturbances: Secondary | ICD-10-CM | POA: Diagnosis not present

## 2020-08-17 DIAGNOSIS — C229 Malignant neoplasm of liver, not specified as primary or secondary: Secondary | ICD-10-CM | POA: Diagnosis not present

## 2020-08-17 DIAGNOSIS — I252 Old myocardial infarction: Secondary | ICD-10-CM | POA: Diagnosis not present

## 2020-08-17 DIAGNOSIS — R600 Localized edema: Secondary | ICD-10-CM | POA: Diagnosis not present

## 2020-08-17 DIAGNOSIS — N183 Chronic kidney disease, stage 3 unspecified: Secondary | ICD-10-CM | POA: Diagnosis not present

## 2020-08-21 ENCOUNTER — Other Ambulatory Visit: Payer: Self-pay

## 2020-08-21 DIAGNOSIS — K7460 Unspecified cirrhosis of liver: Secondary | ICD-10-CM | POA: Diagnosis not present

## 2020-08-21 DIAGNOSIS — R296 Repeated falls: Secondary | ICD-10-CM | POA: Diagnosis not present

## 2020-08-21 DIAGNOSIS — R600 Localized edema: Secondary | ICD-10-CM

## 2020-08-21 DIAGNOSIS — H538 Other visual disturbances: Secondary | ICD-10-CM | POA: Diagnosis not present

## 2020-08-21 DIAGNOSIS — I252 Old myocardial infarction: Secondary | ICD-10-CM | POA: Diagnosis not present

## 2020-08-21 DIAGNOSIS — C229 Malignant neoplasm of liver, not specified as primary or secondary: Secondary | ICD-10-CM | POA: Diagnosis not present

## 2020-08-21 DIAGNOSIS — N183 Chronic kidney disease, stage 3 unspecified: Secondary | ICD-10-CM | POA: Diagnosis not present

## 2020-08-21 DIAGNOSIS — Z8673 Personal history of transient ischemic attack (TIA), and cerebral infarction without residual deficits: Secondary | ICD-10-CM | POA: Diagnosis not present

## 2020-08-21 DIAGNOSIS — E785 Hyperlipidemia, unspecified: Secondary | ICD-10-CM | POA: Diagnosis not present

## 2020-08-21 DIAGNOSIS — I350 Nonrheumatic aortic (valve) stenosis: Secondary | ICD-10-CM | POA: Diagnosis not present

## 2020-08-21 MED ORDER — SPIRONOLACTONE 50 MG PO TABS: 50.0000 mg | ORAL_TABLET | Freq: Every day | ORAL | 0 refills | Status: DC

## 2020-08-21 MED ORDER — FUROSEMIDE 20 MG PO TABS: 20.0000 mg | ORAL_TABLET | Freq: Every day | ORAL | 0 refills | Status: DC

## 2020-08-23 ENCOUNTER — Telehealth: Payer: Self-pay

## 2020-08-23 DIAGNOSIS — K7460 Unspecified cirrhosis of liver: Secondary | ICD-10-CM | POA: Diagnosis not present

## 2020-08-23 DIAGNOSIS — C229 Malignant neoplasm of liver, not specified as primary or secondary: Secondary | ICD-10-CM | POA: Diagnosis not present

## 2020-08-23 DIAGNOSIS — E785 Hyperlipidemia, unspecified: Secondary | ICD-10-CM | POA: Diagnosis not present

## 2020-08-23 DIAGNOSIS — R296 Repeated falls: Secondary | ICD-10-CM | POA: Diagnosis not present

## 2020-08-23 DIAGNOSIS — R600 Localized edema: Secondary | ICD-10-CM | POA: Diagnosis not present

## 2020-08-23 DIAGNOSIS — I350 Nonrheumatic aortic (valve) stenosis: Secondary | ICD-10-CM | POA: Diagnosis not present

## 2020-08-23 DIAGNOSIS — Z8673 Personal history of transient ischemic attack (TIA), and cerebral infarction without residual deficits: Secondary | ICD-10-CM | POA: Diagnosis not present

## 2020-08-23 DIAGNOSIS — H538 Other visual disturbances: Secondary | ICD-10-CM | POA: Diagnosis not present

## 2020-08-23 DIAGNOSIS — N183 Chronic kidney disease, stage 3 unspecified: Secondary | ICD-10-CM | POA: Diagnosis not present

## 2020-08-23 DIAGNOSIS — I252 Old myocardial infarction: Secondary | ICD-10-CM | POA: Diagnosis not present

## 2020-08-23 NOTE — Telephone Encounter (Signed)
David Bradshaw PTA with [facility] (formerly [facility]) said that he is with pt and today sat levels at rest are 89-90.pt has been without furosemide for 5 days but family is picking up furosemide today and pt will take med. Pt has lower leg swelling and slight abd swelling. Pt has SOB resting or upon exertion but pt is not in any distress at this time. No CP, pt has appt with cardiology on 08/27/20 per David Bradshaw; David Bradshaw has given pt UC & ED precautions. David Bradshaw said he understands that pts med are prescribed by other providers but wanted Gentry Fitz NP as PCP to be aware.

## 2020-08-23 NOTE — Telephone Encounter (Signed)
Noted and appreciate the update.  Reviewed Dr. Doyne Keel recommendations via My Chart, he has added furosemide 20 mg daily and spironolactone 50 mg daily.  Await echocardiogram report for next week.

## 2020-08-24 DIAGNOSIS — Z8673 Personal history of transient ischemic attack (TIA), and cerebral infarction without residual deficits: Secondary | ICD-10-CM | POA: Diagnosis not present

## 2020-08-24 DIAGNOSIS — C229 Malignant neoplasm of liver, not specified as primary or secondary: Secondary | ICD-10-CM | POA: Diagnosis not present

## 2020-08-24 DIAGNOSIS — I252 Old myocardial infarction: Secondary | ICD-10-CM | POA: Diagnosis not present

## 2020-08-24 DIAGNOSIS — I350 Nonrheumatic aortic (valve) stenosis: Secondary | ICD-10-CM | POA: Diagnosis not present

## 2020-08-24 DIAGNOSIS — E785 Hyperlipidemia, unspecified: Secondary | ICD-10-CM | POA: Diagnosis not present

## 2020-08-24 DIAGNOSIS — R296 Repeated falls: Secondary | ICD-10-CM | POA: Diagnosis not present

## 2020-08-24 DIAGNOSIS — R600 Localized edema: Secondary | ICD-10-CM | POA: Diagnosis not present

## 2020-08-24 DIAGNOSIS — H538 Other visual disturbances: Secondary | ICD-10-CM | POA: Diagnosis not present

## 2020-08-24 DIAGNOSIS — N183 Chronic kidney disease, stage 3 unspecified: Secondary | ICD-10-CM | POA: Diagnosis not present

## 2020-08-24 DIAGNOSIS — K7460 Unspecified cirrhosis of liver: Secondary | ICD-10-CM | POA: Diagnosis not present

## 2020-08-27 ENCOUNTER — Ambulatory Visit (HOSPITAL_COMMUNITY): Payer: Medicare HMO | Attending: Cardiology

## 2020-08-27 ENCOUNTER — Other Ambulatory Visit: Payer: Self-pay

## 2020-08-27 DIAGNOSIS — R600 Localized edema: Secondary | ICD-10-CM | POA: Insufficient documentation

## 2020-08-27 DIAGNOSIS — R0600 Dyspnea, unspecified: Secondary | ICD-10-CM | POA: Diagnosis not present

## 2020-08-27 DIAGNOSIS — R0609 Other forms of dyspnea: Secondary | ICD-10-CM

## 2020-08-27 LAB — ECHOCARDIOGRAM COMPLETE
AR max vel: 0.82 cm2
AV Area VTI: 1.35 cm2
AV Area mean vel: 0.86 cm2
AV Mean grad: 18 mmHg
AV Peak grad: 28 mmHg
Ao pk vel: 2.65 m/s
P 1/2 time: 275 msec
S' Lateral: 3.5 cm

## 2020-08-28 DIAGNOSIS — I252 Old myocardial infarction: Secondary | ICD-10-CM | POA: Diagnosis not present

## 2020-08-28 DIAGNOSIS — C229 Malignant neoplasm of liver, not specified as primary or secondary: Secondary | ICD-10-CM | POA: Diagnosis not present

## 2020-08-28 DIAGNOSIS — H538 Other visual disturbances: Secondary | ICD-10-CM | POA: Diagnosis not present

## 2020-08-28 DIAGNOSIS — R296 Repeated falls: Secondary | ICD-10-CM | POA: Diagnosis not present

## 2020-08-28 DIAGNOSIS — Z8673 Personal history of transient ischemic attack (TIA), and cerebral infarction without residual deficits: Secondary | ICD-10-CM | POA: Diagnosis not present

## 2020-08-28 DIAGNOSIS — K7460 Unspecified cirrhosis of liver: Secondary | ICD-10-CM | POA: Diagnosis not present

## 2020-08-28 DIAGNOSIS — E785 Hyperlipidemia, unspecified: Secondary | ICD-10-CM | POA: Diagnosis not present

## 2020-08-28 DIAGNOSIS — R600 Localized edema: Secondary | ICD-10-CM | POA: Diagnosis not present

## 2020-08-28 DIAGNOSIS — I350 Nonrheumatic aortic (valve) stenosis: Secondary | ICD-10-CM | POA: Diagnosis not present

## 2020-08-28 DIAGNOSIS — N183 Chronic kidney disease, stage 3 unspecified: Secondary | ICD-10-CM | POA: Diagnosis not present

## 2020-08-29 ENCOUNTER — Other Ambulatory Visit: Payer: Self-pay

## 2020-08-29 ENCOUNTER — Other Ambulatory Visit: Payer: Self-pay | Admitting: Primary Care

## 2020-08-29 DIAGNOSIS — K7460 Unspecified cirrhosis of liver: Secondary | ICD-10-CM

## 2020-08-29 NOTE — Telephone Encounter (Signed)
Spoke with [name], he was calling to provide the fax number for patient's PCP, [phone].   New order in epic for BMET to be drawn at [facility] office at [facility].

## 2020-08-30 ENCOUNTER — Other Ambulatory Visit (INDEPENDENT_AMBULATORY_CARE_PROVIDER_SITE_OTHER): Payer: Medicare HMO

## 2020-08-30 ENCOUNTER — Other Ambulatory Visit: Payer: Self-pay

## 2020-08-30 DIAGNOSIS — I350 Nonrheumatic aortic (valve) stenosis: Secondary | ICD-10-CM | POA: Diagnosis not present

## 2020-08-30 DIAGNOSIS — I252 Old myocardial infarction: Secondary | ICD-10-CM | POA: Diagnosis not present

## 2020-08-30 DIAGNOSIS — R600 Localized edema: Secondary | ICD-10-CM | POA: Diagnosis not present

## 2020-08-30 DIAGNOSIS — Z8673 Personal history of transient ischemic attack (TIA), and cerebral infarction without residual deficits: Secondary | ICD-10-CM | POA: Diagnosis not present

## 2020-08-30 DIAGNOSIS — N183 Chronic kidney disease, stage 3 unspecified: Secondary | ICD-10-CM | POA: Diagnosis not present

## 2020-08-30 DIAGNOSIS — H538 Other visual disturbances: Secondary | ICD-10-CM | POA: Diagnosis not present

## 2020-08-30 DIAGNOSIS — K7460 Unspecified cirrhosis of liver: Secondary | ICD-10-CM

## 2020-08-30 DIAGNOSIS — E785 Hyperlipidemia, unspecified: Secondary | ICD-10-CM | POA: Diagnosis not present

## 2020-08-30 DIAGNOSIS — R296 Repeated falls: Secondary | ICD-10-CM | POA: Diagnosis not present

## 2020-08-30 DIAGNOSIS — C229 Malignant neoplasm of liver, not specified as primary or secondary: Secondary | ICD-10-CM | POA: Diagnosis not present

## 2020-08-30 LAB — BASIC METABOLIC PANEL
BUN: 20 mg/dL (ref 6–23)
CO2: 27 mEq/L (ref 19–32)
Calcium: 8.6 mg/dL (ref 8.4–10.5)
Chloride: 105 mEq/L (ref 96–112)
Creatinine, Ser: 1.48 mg/dL (ref 0.40–1.50)
GFR: 41.16 mL/min — ABNORMAL LOW (ref >60.00)
Glucose, Bld: 165 mg/dL — ABNORMAL HIGH (ref 70–99)
Potassium: 3.9 mEq/L (ref 3.5–5.1)
Sodium: 145 mEq/L (ref 135–145)

## 2020-08-31 ENCOUNTER — Other Ambulatory Visit: Payer: Self-pay

## 2020-08-31 DIAGNOSIS — K7460 Unspecified cirrhosis of liver: Secondary | ICD-10-CM

## 2020-09-04 DIAGNOSIS — K7460 Unspecified cirrhosis of liver: Secondary | ICD-10-CM | POA: Diagnosis not present

## 2020-09-04 DIAGNOSIS — C229 Malignant neoplasm of liver, not specified as primary or secondary: Secondary | ICD-10-CM | POA: Diagnosis not present

## 2020-09-04 DIAGNOSIS — I252 Old myocardial infarction: Secondary | ICD-10-CM | POA: Diagnosis not present

## 2020-09-04 DIAGNOSIS — N183 Chronic kidney disease, stage 3 unspecified: Secondary | ICD-10-CM | POA: Diagnosis not present

## 2020-09-04 DIAGNOSIS — Z8673 Personal history of transient ischemic attack (TIA), and cerebral infarction without residual deficits: Secondary | ICD-10-CM | POA: Diagnosis not present

## 2020-09-04 DIAGNOSIS — E785 Hyperlipidemia, unspecified: Secondary | ICD-10-CM | POA: Diagnosis not present

## 2020-09-04 DIAGNOSIS — R600 Localized edema: Secondary | ICD-10-CM | POA: Diagnosis not present

## 2020-09-04 DIAGNOSIS — I350 Nonrheumatic aortic (valve) stenosis: Secondary | ICD-10-CM | POA: Diagnosis not present

## 2020-09-04 DIAGNOSIS — H538 Other visual disturbances: Secondary | ICD-10-CM | POA: Diagnosis not present

## 2020-09-04 DIAGNOSIS — R296 Repeated falls: Secondary | ICD-10-CM | POA: Diagnosis not present

## 2020-09-06 DIAGNOSIS — I350 Nonrheumatic aortic (valve) stenosis: Secondary | ICD-10-CM | POA: Diagnosis not present

## 2020-09-06 DIAGNOSIS — N183 Chronic kidney disease, stage 3 unspecified: Secondary | ICD-10-CM | POA: Diagnosis not present

## 2020-09-06 DIAGNOSIS — K7460 Unspecified cirrhosis of liver: Secondary | ICD-10-CM | POA: Diagnosis not present

## 2020-09-06 DIAGNOSIS — I252 Old myocardial infarction: Secondary | ICD-10-CM | POA: Diagnosis not present

## 2020-09-06 DIAGNOSIS — C229 Malignant neoplasm of liver, not specified as primary or secondary: Secondary | ICD-10-CM | POA: Diagnosis not present

## 2020-09-06 DIAGNOSIS — E785 Hyperlipidemia, unspecified: Secondary | ICD-10-CM | POA: Diagnosis not present

## 2020-09-06 DIAGNOSIS — Z8673 Personal history of transient ischemic attack (TIA), and cerebral infarction without residual deficits: Secondary | ICD-10-CM | POA: Diagnosis not present

## 2020-09-06 DIAGNOSIS — H538 Other visual disturbances: Secondary | ICD-10-CM | POA: Diagnosis not present

## 2020-09-06 DIAGNOSIS — R296 Repeated falls: Secondary | ICD-10-CM | POA: Diagnosis not present

## 2020-09-06 DIAGNOSIS — Z87891 Personal history of nicotine dependence: Secondary | ICD-10-CM

## 2020-09-06 DIAGNOSIS — R600 Localized edema: Secondary | ICD-10-CM | POA: Diagnosis not present

## 2020-09-11 DIAGNOSIS — R296 Repeated falls: Secondary | ICD-10-CM | POA: Diagnosis not present

## 2020-09-11 DIAGNOSIS — C229 Malignant neoplasm of liver, not specified as primary or secondary: Secondary | ICD-10-CM | POA: Diagnosis not present

## 2020-09-11 DIAGNOSIS — N183 Chronic kidney disease, stage 3 unspecified: Secondary | ICD-10-CM | POA: Diagnosis not present

## 2020-09-11 DIAGNOSIS — R600 Localized edema: Secondary | ICD-10-CM | POA: Diagnosis not present

## 2020-09-11 DIAGNOSIS — K7460 Unspecified cirrhosis of liver: Secondary | ICD-10-CM | POA: Diagnosis not present

## 2020-09-11 DIAGNOSIS — I350 Nonrheumatic aortic (valve) stenosis: Secondary | ICD-10-CM | POA: Diagnosis not present

## 2020-09-11 DIAGNOSIS — Z8673 Personal history of transient ischemic attack (TIA), and cerebral infarction without residual deficits: Secondary | ICD-10-CM | POA: Diagnosis not present

## 2020-09-11 DIAGNOSIS — I252 Old myocardial infarction: Secondary | ICD-10-CM | POA: Diagnosis not present

## 2020-09-11 DIAGNOSIS — E785 Hyperlipidemia, unspecified: Secondary | ICD-10-CM | POA: Diagnosis not present

## 2020-09-11 DIAGNOSIS — H538 Other visual disturbances: Secondary | ICD-10-CM | POA: Diagnosis not present

## 2020-09-13 ENCOUNTER — Inpatient Hospital Stay (HOSPITAL_COMMUNITY)
Admission: EM | Admit: 2020-09-13 | Discharge: 2020-09-21 | DRG: 432 | Disposition: A | Discharge status: Skilled Nursing Facility | Payer: Medicare HMO | Attending: Internal Medicine | Admitting: Internal Medicine

## 2020-09-13 ENCOUNTER — Other Ambulatory Visit: Payer: Self-pay

## 2020-09-13 ENCOUNTER — Emergency Department (HOSPITAL_COMMUNITY): Payer: Medicare HMO

## 2020-09-13 ENCOUNTER — Telehealth: Payer: Self-pay

## 2020-09-13 ENCOUNTER — Encounter (HOSPITAL_COMMUNITY): Payer: Self-pay

## 2020-09-13 DIAGNOSIS — K7469 Other cirrhosis of liver: Secondary | ICD-10-CM | POA: Diagnosis not present

## 2020-09-13 DIAGNOSIS — Z87891 Personal history of nicotine dependence: Secondary | ICD-10-CM

## 2020-09-13 DIAGNOSIS — I2510 Atherosclerotic heart disease of native coronary artery without angina pectoris: Secondary | ICD-10-CM | POA: Diagnosis present

## 2020-09-13 DIAGNOSIS — R0602 Shortness of breath: Secondary | ICD-10-CM | POA: Diagnosis not present

## 2020-09-13 DIAGNOSIS — Z79899 Other long term (current) drug therapy: Secondary | ICD-10-CM

## 2020-09-13 DIAGNOSIS — Z888 Allergy status to other drugs, medicaments and biological substances status: Secondary | ICD-10-CM

## 2020-09-13 DIAGNOSIS — Z20822 Contact with and (suspected) exposure to covid-19: Secondary | ICD-10-CM | POA: Diagnosis not present

## 2020-09-13 DIAGNOSIS — R188 Other ascites: Secondary | ICD-10-CM | POA: Diagnosis not present

## 2020-09-13 DIAGNOSIS — I714 Abdominal aortic aneurysm, without rupture: Secondary | ICD-10-CM | POA: Diagnosis present

## 2020-09-13 DIAGNOSIS — Z809 Family history of malignant neoplasm, unspecified: Secondary | ICD-10-CM

## 2020-09-13 DIAGNOSIS — Z8711 Personal history of peptic ulcer disease: Secondary | ICD-10-CM

## 2020-09-13 DIAGNOSIS — Z515 Encounter for palliative care: Secondary | ICD-10-CM

## 2020-09-13 DIAGNOSIS — Z743 Need for continuous supervision: Secondary | ICD-10-CM | POA: Diagnosis not present

## 2020-09-13 DIAGNOSIS — I5021 Acute systolic (congestive) heart failure: Secondary | ICD-10-CM

## 2020-09-13 DIAGNOSIS — Z8673 Personal history of transient ischemic attack (TIA), and cerebral infarction without residual deficits: Secondary | ICD-10-CM

## 2020-09-13 DIAGNOSIS — S0990XA Unspecified injury of head, initial encounter: Secondary | ICD-10-CM | POA: Diagnosis not present

## 2020-09-13 DIAGNOSIS — J90 Pleural effusion, not elsewhere classified: Secondary | ICD-10-CM | POA: Diagnosis not present

## 2020-09-13 DIAGNOSIS — K7460 Unspecified cirrhosis of liver: Secondary | ICD-10-CM | POA: Diagnosis not present

## 2020-09-13 DIAGNOSIS — I499 Cardiac arrhythmia, unspecified: Secondary | ICD-10-CM | POA: Diagnosis not present

## 2020-09-13 DIAGNOSIS — N189 Chronic kidney disease, unspecified: Secondary | ICD-10-CM

## 2020-09-13 DIAGNOSIS — I5023 Acute on chronic systolic (congestive) heart failure: Secondary | ICD-10-CM | POA: Diagnosis present

## 2020-09-13 DIAGNOSIS — Z7982 Long term (current) use of aspirin: Secondary | ICD-10-CM

## 2020-09-13 DIAGNOSIS — I509 Heart failure, unspecified: Secondary | ICD-10-CM | POA: Diagnosis not present

## 2020-09-13 DIAGNOSIS — R54 Age-related physical debility: Secondary | ICD-10-CM | POA: Diagnosis present

## 2020-09-13 DIAGNOSIS — L97511 Non-pressure chronic ulcer of other part of right foot limited to breakdown of skin: Secondary | ICD-10-CM | POA: Diagnosis present

## 2020-09-13 DIAGNOSIS — N179 Acute kidney failure, unspecified: Secondary | ICD-10-CM | POA: Diagnosis not present

## 2020-09-13 DIAGNOSIS — I491 Atrial premature depolarization: Secondary | ICD-10-CM | POA: Diagnosis not present

## 2020-09-13 DIAGNOSIS — N1832 Chronic kidney disease, stage 3b: Secondary | ICD-10-CM | POA: Diagnosis present

## 2020-09-13 DIAGNOSIS — E6609 Other obesity due to excess calories: Secondary | ICD-10-CM

## 2020-09-13 DIAGNOSIS — R519 Headache, unspecified: Secondary | ICD-10-CM | POA: Diagnosis not present

## 2020-09-13 DIAGNOSIS — I252 Old myocardial infarction: Secondary | ICD-10-CM

## 2020-09-13 DIAGNOSIS — J439 Emphysema, unspecified: Secondary | ICD-10-CM | POA: Diagnosis not present

## 2020-09-13 DIAGNOSIS — W010XXA Fall on same level from slipping, tripping and stumbling without subsequent striking against object, initial encounter: Secondary | ICD-10-CM | POA: Diagnosis present

## 2020-09-13 DIAGNOSIS — J810 Acute pulmonary edema: Secondary | ICD-10-CM

## 2020-09-13 DIAGNOSIS — K7581 Nonalcoholic steatohepatitis (NASH): Secondary | ICD-10-CM | POA: Diagnosis present

## 2020-09-13 DIAGNOSIS — Z8249 Family history of ischemic heart disease and other diseases of the circulatory system: Secondary | ICD-10-CM

## 2020-09-13 DIAGNOSIS — I440 Atrioventricular block, first degree: Secondary | ICD-10-CM | POA: Diagnosis not present

## 2020-09-13 DIAGNOSIS — I5022 Chronic systolic (congestive) heart failure: Secondary | ICD-10-CM | POA: Diagnosis present

## 2020-09-13 DIAGNOSIS — L600 Ingrowing nail: Secondary | ICD-10-CM | POA: Diagnosis present

## 2020-09-13 DIAGNOSIS — Z6832 Body mass index (BMI) 32.0-32.9, adult: Secondary | ICD-10-CM | POA: Diagnosis not present

## 2020-09-13 DIAGNOSIS — C220 Liver cell carcinoma: Secondary | ICD-10-CM | POA: Diagnosis present

## 2020-09-13 DIAGNOSIS — Z66 Do not resuscitate: Secondary | ICD-10-CM | POA: Diagnosis not present

## 2020-09-13 DIAGNOSIS — E440 Moderate protein-calorie malnutrition: Secondary | ICD-10-CM | POA: Diagnosis present

## 2020-09-13 HISTORY — DX: Do not resuscitate: Z66

## 2020-09-13 LAB — COMPREHENSIVE METABOLIC PANEL
ALT: 30 U/L (ref 0–44)
AST: 49 U/L — ABNORMAL HIGH (ref 15–41)
Albumin: 2.5 g/dL — ABNORMAL LOW (ref 3.5–5.0)
Alkaline Phosphatase: 395 U/L — ABNORMAL HIGH (ref 38–126)
Anion gap: 9 (ref 5–15)
BUN: 21 mg/dL (ref 8–23)
CO2: 27 mmol/L (ref 22–32)
Calcium: 8.3 mg/dL — ABNORMAL LOW (ref 8.9–10.3)
Chloride: 105 mmol/L (ref 98–111)
Creatinine, Ser: 1.97 mg/dL — ABNORMAL HIGH (ref 0.61–1.24)
GFR, Estimated: 31 mL/min — ABNORMAL LOW (ref >60)
Glucose, Bld: 124 mg/dL — ABNORMAL HIGH (ref 70–99)
Potassium: 4.6 mmol/L (ref 3.5–5.1)
Sodium: 141 mmol/L (ref 135–145)
Total Bilirubin: 1.6 mg/dL — ABNORMAL HIGH (ref 0.3–1.2)
Total Protein: 6.2 g/dL — ABNORMAL LOW (ref 6.5–8.1)

## 2020-09-13 LAB — I-STAT CHEM 8, ED
BUN: 26 mg/dL — ABNORMAL HIGH (ref 8–23)
Calcium, Ion: 1.06 mmol/L — ABNORMAL LOW (ref 1.15–1.40)
Chloride: 106 mmol/L (ref 98–111)
Creatinine, Ser: 1.7 mg/dL — ABNORMAL HIGH (ref 0.61–1.24)
Glucose, Bld: 122 mg/dL — ABNORMAL HIGH (ref 70–99)
HCT: 51 % (ref 39.0–52.0)
Hemoglobin: 17.3 g/dL — ABNORMAL HIGH (ref 13.0–17.0)
Potassium: 4.6 mmol/L (ref 3.5–5.1)
Sodium: 142 mmol/L (ref 135–145)
TCO2: 28 mmol/L (ref 22–32)

## 2020-09-13 LAB — CBC WITH DIFFERENTIAL/PLATELET
Abs Immature Granulocytes: 0.06 10*3/uL (ref 0.00–0.07)
Basophils Absolute: 0.1 10*3/uL (ref 0.0–0.1)
Basophils Relative: 1 %
Eosinophils Absolute: 0.1 10*3/uL (ref 0.0–0.5)
Eosinophils Relative: 1 %
HCT: 52.5 % — ABNORMAL HIGH (ref 39.0–52.0)
Hemoglobin: 17.2 g/dL — ABNORMAL HIGH (ref 13.0–17.0)
Immature Granulocytes: 1 %
Lymphocytes Relative: 20 %
Lymphs Abs: 2.1 10*3/uL (ref 0.7–4.0)
MCH: 32.5 pg (ref 26.0–34.0)
MCHC: 32.8 g/dL (ref 30.0–36.0)
MCV: 99.1 fL (ref 80.0–100.0)
Monocytes Absolute: 1 10*3/uL (ref 0.1–1.0)
Monocytes Relative: 9 %
Neutro Abs: 7.4 10*3/uL (ref 1.7–7.7)
Neutrophils Relative %: 68 %
Platelets: 191 10*3/uL (ref 150–400)
RBC: 5.3 MIL/uL (ref 4.22–5.81)
RDW: 14.1 % (ref 11.5–15.5)
WBC: 10.8 10*3/uL — ABNORMAL HIGH (ref 4.0–10.5)
nRBC: 0.2 % (ref 0.0–0.2)

## 2020-09-13 LAB — RESP PANEL BY RT-PCR (FLU A&B, COVID) ARPGX2
Influenza A by PCR: NEGATIVE
Influenza B by PCR: NEGATIVE
SARS Coronavirus 2 by RT PCR: NEGATIVE

## 2020-09-13 LAB — BRAIN NATRIURETIC PEPTIDE: B Natriuretic Peptide: 207.3 pg/mL — ABNORMAL HIGH (ref 0.0–100.0)

## 2020-09-13 LAB — PROTIME-INR
INR: 1 (ref 0.8–1.2)
Prothrombin Time: 13 seconds (ref 11.4–15.2)

## 2020-09-13 LAB — TROPONIN I (HIGH SENSITIVITY): Troponin I (High Sensitivity): 20 ng/L — ABNORMAL HIGH (ref <18)

## 2020-09-13 MED ORDER — FUROSEMIDE 10 MG/ML IJ SOLN
40.0000 mg | Freq: Once | INTRAMUSCULAR | Status: AC
  Administered 2020-09-13: 40 mg via INTRAVENOUS
  Filled 2020-09-13: qty 4

## 2020-09-13 MED ORDER — SPIRONOLACTONE 25 MG PO TABS
50.0000 mg | ORAL_TABLET | Freq: Every day | ORAL | Status: DC
  Administered 2020-09-14 – 2020-09-20 (×7): 50 mg via ORAL
  Filled 2020-09-13 (×7): qty 2

## 2020-09-13 MED ORDER — BISACODYL 5 MG PO TBEC: 5.0000 mg | DELAYED_RELEASE_TABLET | Freq: Every day | ORAL | Status: DC | PRN

## 2020-09-13 MED ORDER — OXYCODONE HCL 5 MG PO TABS
5.0000 mg | ORAL_TABLET | ORAL | Status: DC | PRN
Start: 2020-09-13 — End: 2020-09-21

## 2020-09-13 MED ORDER — ACETAMINOPHEN 650 MG RE SUPP: 650.0000 mg | Freq: Four times a day (QID) | RECTAL | Status: DC | PRN

## 2020-09-13 MED ORDER — ACETAMINOPHEN 325 MG PO TABS
650.0000 mg | ORAL_TABLET | Freq: Four times a day (QID) | ORAL | Status: DC | PRN
  Filled 2020-09-13: qty 2

## 2020-09-13 MED ORDER — ONDANSETRON HCL 4 MG/2ML IJ SOLN: 4.0000 mg | Freq: Four times a day (QID) | INTRAMUSCULAR | Status: DC | PRN

## 2020-09-13 MED ORDER — PANTOPRAZOLE SODIUM 40 MG PO TBEC
40.0000 mg | DELAYED_RELEASE_TABLET | Freq: Two times a day (BID) | ORAL | Status: DC
  Administered 2020-09-13 – 2020-09-20 (×15): 40 mg via ORAL
  Filled 2020-09-13 (×15): qty 1

## 2020-09-13 MED ORDER — POLYETHYLENE GLYCOL 3350 17 G PO PACK: 17.0000 g | PACK | Freq: Every day | ORAL | Status: DC | PRN

## 2020-09-13 MED ORDER — FUROSEMIDE 10 MG/ML IJ SOLN
40.0000 mg | Freq: Two times a day (BID) | INTRAMUSCULAR | Status: DC
  Administered 2020-09-13 – 2020-09-15 (×3): 40 mg via INTRAVENOUS
  Filled 2020-09-13 (×6): qty 4

## 2020-09-13 MED ORDER — DOCUSATE SODIUM 100 MG PO CAPS
100.0000 mg | ORAL_CAPSULE | Freq: Two times a day (BID) | ORAL | Status: DC
  Administered 2020-09-13 – 2020-09-20 (×11): 100 mg via ORAL
  Filled 2020-09-13 (×14): qty 1

## 2020-09-13 MED ORDER — SODIUM CHLORIDE 0.9% FLUSH
3.0000 mL | Freq: Two times a day (BID) | INTRAVENOUS | Status: DC
  Administered 2020-09-13 – 2020-09-20 (×15): 3 mL via INTRAVENOUS

## 2020-09-13 MED ORDER — ONDANSETRON HCL 4 MG PO TABS: 4.0000 mg | ORAL_TABLET | Freq: Four times a day (QID) | ORAL | Status: DC | PRN

## 2020-09-13 NOTE — ED Notes (Signed)
Attempted report x 2 

## 2020-09-13 NOTE — Telephone Encounter (Signed)
[place] [facility] - Client TELEPHONE ADVICE RECORD [facility] Patient Name: David Bradshaw Gender: Male DOB: 09/13/1926 Age: 87 Y 6 M 16 D Return Phone Number: [phone] (Primary) Address: City/ State/ Zip: [place] [address] Client [place] [facility] - Client Client Site [place] - Day Physician Alma Friendly - NP Contact Type Call Who Is Calling Patient / Member / Family / Caregiver Call Type Triage / Clinical Caller Name [name] Relationship To Patient Son Return Phone Number [phone] (Primary) Chief Complaint BREATHING - shortness of breath or sounds breathless Reason for Call Symptomatic / Request for [place] states he has fluid build up in his stomach. Shortness of breath if he gets up. He has a sore on his foot. Ingrown toenail. [place] Not Listed [place] Translation No Nurse Assessment Nurse: [place], RN, Estill Bamberg Date/Time (Eastern Time): 09/13/2020 9:18:10 AM Confirm and document reason for call. If symptomatic, describe symptoms. ---caller states father has fluid on feet and legs. has cirrhosis and on fluid pills. has fluid build up in abd. say he feels short of breath. has ingrown toe nail. and has sore on same foot. Does the patient have any new or worsening symptoms? ---Yes Will a triage be completed? ---Yes Related visit to physician within the last 2 weeks? ---No Does the PT have any chronic conditions? (i.e. diabetes, asthma, this includes High risk factors for pregnancy, etc.) ---Yes List chronic conditions. ---cirrhosis Is this a behavioral health or substance abuse call? ---No Guidelines Guideline Title Affirmed Question Affirmed Notes Nurse Date/Time Eilene Ghazi Time) Leg Swelling and Edema [1] Difficulty breathing with exertion (e.g., walking) AND [place], RN, Estill Bamberg 09/13/2020 9:19:13 AM PLEASE NOTE: All timestamps  contained within this report are represented as [place] Standard Time. CONFIDENTIALTY NOTICE: This fax transmission is intended only for the addressee. It contains information that is legally privileged, confidential or otherwise protected from use or disclosure. If you are not the intended recipient, you are strictly prohibited from reviewing, disclosing, copying using or disseminating any of this information or taking any action in reliance on or regarding this information. If you have received this fax in error, please notify us immediately by telephone so that we can arrange for its return to [place]. Phone: [phone], Toll-Free: [phone], Fax: [phone] Page: 2 of 2 Call Id: [id number] Guidelines Guideline Title Affirmed Question Affirmed Notes Nurse Date/Time Eilene Ghazi Time) [2] new-onset or worsening Disp. Time Eilene Ghazi Time) Disposition Final User 09/13/2020 9:15:43 AM Send to Urgent Queue Baruch Goldmann 09/13/2020 9:27:47 AM Go to ED Now (or PCP triage) Yes [place], RN, Shelly Coss Disagree/Comply Comply Caller Understands Yes PreDisposition Did not know what to do Care Advice Given Per Guideline GO TO ED NOW (OR PCP TRIAGE): * IF NO PCP (PRIMARY CARE PROVIDER) SECOND-LEVEL TRIAGE: You need to be seen within the next hour. Go to the [facility] at _____________ [place] as soon as you can. CARE ADVICE given per Leg Swelling and Edema (Adult) guideline. Comments User: Baruch Goldmann Date/Time Eilene Ghazi Time): 09/13/2020 9:14:00 AM Caller transferred from office, has an appointment tomorrow. Referrals GO TO FACILITY OTHER - SPECIFY

## 2020-09-13 NOTE — ED Triage Notes (Signed)
Pt BIB EMS from home. Pt's son was helping pt ambulate to his vehicle and pt slipped, fell, and hit the back of his head. There was no injury to the head or neck and he is not on blood thinners. Pt has reported dyspnea x couple weeks and has a hx of 2 MIs and cirrhosis. He is currently on Lasix and has taken this daily for 1 month. He is currently A&O x4, and VSS BP 119/85, SpO2 96% on 3L O2 via [place] which is not baseline for pt, and HR 126. During transport, EMS reported tachycardia followed by 4 PVCs, and wide complex tachycardia. Pt denies CP

## 2020-09-13 NOTE — ED Notes (Signed)
CBG 212   MD and primary nurse notified

## 2020-09-13 NOTE — ED Provider Notes (Signed)
[facility] EMERGENCY DEPARTMENT Provider Note   CSN: [id number] Arrival date & time: 09/13/20  1231     History Chief Complaint  Patient presents with  . Shortness of Breath  . Fall  . Abnormal ECG    David WALZ Sr. is a 87 y.o. male.  87 yo M with a chief complaints of shortness of breath.  Tells me this is worsening over the past few months.  Has had worse lower extremity edema as well.  He denies any chest pain or pressure.  Has a cough every now and again to clear the back of his throat but denies any persistent cough.  Denies any abdominal pain nausea vomiting or diarrhea.  Denies dark stool or blood in his stool.  Denies any recent medication changes.  The history is provided by the patient and the EMS personnel.  Shortness of Breath Severity:  Moderate Onset quality:  Sudden Duration:  2 months Timing:  Constant Progression:  Worsening Chronicity:  New Relieved by:  Nothing Worsened by:  Nothing Ineffective treatments:  None tried Associated symptoms: no abdominal pain, no chest pain, no fever, no headaches, no rash and no vomiting   Fall Associated symptoms include shortness of breath. Pertinent negatives include no chest pain, no abdominal pain and no headaches.       Past Medical History:  Diagnosis Date  . Aortic stenosis    mild AS 01/2016 echo  . Carotid stenosis   . Chicken pox   . Choledocholithiasis   . Cirrhosis ([place])   . Coronary arteriosclerosis due to lipid rich plaque 01/30/2016  . Edema   . Esophagitis   . Gastric ulcer   . Hepatocellular carcinoma ([place]) 07/05/2020  . Myocardial infarct ([place])    1988 and 1990  . Stroke [facility])     Patient Active Problem List   Diagnosis Date Noted  . Cerumen impaction 08/02/2020  . Exertional dyspnea 08/02/2020  . Multiple falls 08/02/2020  . Hepatic cirrhosis ([place]) 08/02/2020  . Jaundice 06/01/2020  . Fatigue 06/01/2020  . Bilateral lower extremity edema 02/11/2016  . Chronic  kidney disease, stage 3 ([place]) 02/11/2016  . Stroke ([place])   . Rash and nonspecific skin eruption   . HLD (hyperlipidemia)   . Carotid stenosis   . History of CVA (cerebrovascular accident) 01/31/2016  . TIA (transient ischemic attack) 01/30/2016    Past Surgical History:  Procedure Laterality Date  . BIOPSY  07/17/2020   Procedure: BIOPSY;  Surgeon: Rush Landmark Telford Nab., MD;  Location: [facility];  Service: Gastroenterology;;  . CARDIAC CATHETERIZATION N/A 02/04/2016   Procedure: Left Heart Cath and Coronary Angiography;  Surgeon: Jolaine Artist, MD;  Location: [facility] CV LAB;  Service: Cardiovascular;  Laterality: N/A;  . CAROTID ENDARTERECTOMY Right 02/14/2016  . CATARACT EXTRACTION    . ENDARTERECTOMY Right 02/14/2016   Procedure: RIGHT CAROTID ENDARTERECTOMY;  Surgeon: [name], MD;  Location: [facility];  Service: Vascular;  Laterality: Right;  . ERCP N/A 07/17/2020   Procedure: ENDOSCOPIC RETROGRADE CHOLANGIOPANCREATOGRAPHY (ERCP);  Surgeon: Irving Copas., MD;  Location: [facility];  Service: Gastroenterology;  Laterality: N/A;  . IR RADIOLOGIST EVAL & MGMT  07/27/2020  . PATCH ANGIOPLASTY Right 02/14/2016   Procedure: PATCH ANGIOPLASTY USING [place] X2;  Surgeon: [name], MD;  Location: [facility];  Service: Vascular;  Laterality: Right;  . REMOVAL OF STONES  07/17/2020   Procedure: REMOVAL OF STONES;  Surgeon: Irving Copas.,  MD;  Location: [facility];  Service: Gastroenterology;;  . Joan Mayans  07/17/2020   Procedure: SPHINCTEROTOMY;  Surgeon: Mansouraty, Telford Nab., MD;  Location: [facility];  Service: Gastroenterology;;  . testicle     right hydrocele       Family History  Problem Relation Age of Onset  . Heart attack Mother   . Heart disease Mother   . Bone cancer Father   . Cancer Father     Social History   Tobacco Use  . Smoking status: Former Smoker    Packs/day: 1.00    Years:  35.00    Pack years: 35.00    Quit date: 07/13/1985    Years since quitting: 35.1  . Smokeless tobacco: Never Used  Vaping Use  . Vaping Use: Never used  Substance Use Topics  . Alcohol use: No  . Drug use: No    Home Medications Prior to Admission medications   Medication Sig Start Date End Date Taking? Authorizing Provider  aspirin EC 325 MG tablet Take 1 tablet (325 mg total) by mouth daily. 02/06/16   Elgergawy, Silver Huguenin, MD  furosemide (LASIX) 20 MG tablet Take 1 tablet (20 mg total) by mouth daily. For leg swelling. 08/21/20   Armbruster, Carlota Raspberry, MD  Multiple Vitamins-Minerals (ICAPS) TABS Take 1 tablet by mouth daily at 12 noon.    [provider]  omeprazole (PRILOSEC) 40 MG capsule Take 1 capsule (40 mg total) by mouth 2 (two) times daily before a meal. 07/17/20 07/17/21  Mansouraty, Telford Nab., MD  spironolactone (ALDACTONE) 50 MG tablet Take 1 tablet (50 mg total) by mouth daily. 08/21/20   Armbruster, Carlota Raspberry, MD    Allergies    Atorvastatin, Brilinta [ticagrelor], and Plavix [clopidogrel bisulfate]  Review of Systems   Review of Systems  Constitutional: Negative for chills and fever.  HENT: Negative for congestion and facial swelling.   Eyes: Negative for discharge and visual disturbance.  Respiratory: Positive for shortness of breath.   Cardiovascular: Positive for leg swelling. Negative for chest pain and palpitations.  Gastrointestinal: Negative for abdominal pain, diarrhea and vomiting.  Musculoskeletal: Negative for arthralgias and myalgias.  Skin: Negative for color change and rash.  Neurological: Negative for tremors, syncope and headaches.  Psychiatric/Behavioral: Negative for confusion and dysphoric mood.    Physical Exam Updated Vital Signs BP 101/76   Pulse 95   Temp 97.6 F (36.4 C) (Oral)   Resp 18   Ht 5' (1.524 m)   Wt 76.6 kg   SpO2 96%   BMI 32.98 kg/m   Physical Exam Vitals and nursing note reviewed.  Constitutional:       Appearance: He is well-developed.  HENT:     Head: Normocephalic and atraumatic.  Eyes:     Pupils: Pupils are equal, round, and reactive to light.  Neck:     Vascular: No JVD.  Cardiovascular:     Rate and Rhythm: Normal rate and regular rhythm.     Heart sounds: No murmur heard. No friction rub. No gallop.   Pulmonary:     Effort: No respiratory distress.     Breath sounds: Rales ( Trace at the bases) present. No wheezing.  Abdominal:     General: There is no distension.     Tenderness: There is no guarding or rebound.  Musculoskeletal:        General: Normal range of motion.     Cervical back: Normal range of motion and neck supple.  Right lower leg: Edema present.     Left lower leg: Edema present.     Comments: 3+ edema to bilateral lower extremities above the knees.  Skin:    Coloration: Skin is not pale.     Findings: No rash.  Neurological:     Mental Status: He is alert and oriented to person, place, and time.  Psychiatric:        Behavior: Behavior normal.     ED Results / Procedures / Treatments   Labs (all labs ordered are listed, but only abnormal results are displayed) Labs Reviewed  CBC WITH DIFFERENTIAL/PLATELET - Abnormal; Notable for the following components:      Result Value   WBC 10.8 (*)    Hemoglobin 17.2 (*)    HCT 52.5 (*)    All other components within normal limits  COMPREHENSIVE METABOLIC PANEL - Abnormal; Notable for the following components:   Glucose, Bld 124 (*)    Creatinine, Ser 1.97 (*)    Calcium 8.3 (*)    Total Protein 6.2 (*)    Albumin 2.5 (*)    AST 49 (*)    Alkaline Phosphatase 395 (*)    Total Bilirubin 1.6 (*)    GFR, Estimated 31 (*)    All other components within normal limits  BRAIN NATRIURETIC PEPTIDE - Abnormal; Notable for the following components:   B Natriuretic Peptide 207.3 (*)    All other components within normal limits  I-STAT CHEM 8, ED - Abnormal; Notable for the following components:   BUN 26 (*)     Creatinine, Ser 1.70 (*)    Glucose, Bld 122 (*)    Calcium, Ion 1.06 (*)    Hemoglobin 17.3 (*)    All other components within normal limits  TROPONIN I (HIGH SENSITIVITY) - Abnormal; Notable for the following components:   Troponin I (High Sensitivity) 20 (*)    All other components within normal limits  RESP PANEL BY RT-PCR (FLU A&B, COVID) [id number]  PROTIME-INR    EKG EKG Interpretation  Date/Time:  Thursday September 13 2020 12:45:22 EDT Ventricular Rate:  113 PR Interval:  208 QRS Duration: 63 QT Interval:  343 QTC Calculation: 471 R Axis:   35 Text Interpretation: Sinus tachycardia Paired ventricular premature complexes Borderline prolonged PR interval Inferior infarct, age indeterminate Anterolateral infarct, age indeterminate No significant change since last tracing Confirmed by Deno Etienne [phone]) on 09/13/2020 12:55:00 PM   Radiology CT Head Wo Contrast  Result Date: 09/13/2020 CLINICAL DATA:  Pain following fall EXAM: CT HEAD WITHOUT CONTRAST TECHNIQUE: Contiguous axial images were obtained from the base of the skull through the vertex without intravenous contrast. COMPARISON:  January 30, 2016 head CT and brain MRI. FINDINGS: Brain: Moderate diffuse atrophy is stable. There is no intracranial mass, hemorrhage, extra-axial fluid collection, or midline shift. There is evidence of a prior small infarct in the posterior mid left cerebellum, stable. There is evidence of a prior infarct in the right mid parietal lobe posteriorly, stable. There is decreased attenuation in periventricular white matter consistent with periventricular small vessel disease. No acute infarct is evident. Vascular: No hyperdense vessel. Calcification noted in each carotid siphon region. Skull: Bony calvarium appears intact. Stable 7 mm left frontal dural calcification, a likely small enostosis. No surrounding edema. This is a finding of no clinical significance. Sinuses/Orbits: There is slight mucosal  thickening in several ethmoid air cells. Other visualized paranasal sinuses are clear. Orbits appear symmetric bilaterally. Other: Mastoid air cells  are clear. IMPRESSION: Atrophy with periventricular small vessel disease. Prior infarct in the mid to posterior right parietal lobe and in the posterior mid left cerebellum. No acute infarct appreciable. No mass or hemorrhage. There are foci of arterial vascular calcification. There is mucosal thickening in several ethmoid air cells. Electronically Signed   By: Lowella Grip III M.D.   On: 09/13/2020 15:11   DG Chest Port 1 View  Result Date: 09/13/2020 CLINICAL DATA:  Shortness of breath. EXAM: PORTABLE CHEST 1 VIEW COMPARISON:  Chest x-ray dated June 06, 2020. FINDINGS: The heart size and mediastinal contours are within normal limits. Normal pulmonary vascularity. Emphysematous changes again noted. Increasing small left pleural effusion with left basilar opacity. Unchanged scarring at the right lung base. No pneumothorax. No acute osseous abnormality. IMPRESSION: 1. Increasing small left pleural effusion with left basilar atelectasis versus infiltrate. Electronically Signed   By: Titus Dubin M.D.   On: 09/13/2020 13:56    Procedures Procedures   Medications Ordered in ED Medications  furosemide (LASIX) injection 40 mg (40 mg Intravenous Given 09/13/20 1502)    ED Course  I have reviewed the triage vital signs and the nursing notes.  Pertinent labs & imaging results that were available during my care of the patient were reviewed by me and considered in my medical decision making (see chart for details).    MDM Rules/Calculators/A&P                          87 yo M with a chief complaints of shortness of breath and lower extremity edema.  Going on for a few months and worsening over the past few days.  Woke up in the middle the night for like he was having trouble breathing.  Found with EMS to be acutely hypoxic started on 3 L of  oxygen with improvement and brought here.  Patient does have a history of cirrhosis.  He denies history of heart failure.  Plain film with a small left pleural effusion as viewed by me.  We will give a dose of Lasix.  Awaiting blood work.  Blood work is resulted.  Mildly elevated BNP.  Troponin mildly elevated at 20.  Mild improvement with Lasix administration.  Will discuss with the hospitalist for admission.  CRITICAL CARE Performed by: Cecilio Asper   Total critical care time: 35 minutes  Critical care time was exclusive of separately billable procedures and treating other patients.  Critical care was necessary to treat or prevent imminent or life-threatening deterioration.  Critical care was time spent personally by me on the following activities: development of treatment plan with patient and/or surrogate as well as nursing, discussions with consultants, evaluation of patient's response to treatment, examination of patient, obtaining history from patient or surrogate, ordering and performing treatments and interventions, ordering and review of laboratory studies, ordering and review of radiographic studies, pulse oximetry and re-evaluation of patient's condition.  The patients results and plan were reviewed and discussed.   Any x-rays performed were independently reviewed by myself.   Differential diagnosis were considered with the presenting HPI.  Medications  furosemide (LASIX) injection 40 mg (40 mg Intravenous Given 09/13/20 1502)    Vitals:   09/13/20 1355 09/13/20 1415 09/13/20 1430 09/13/20 1500  BP: 93/66 94/75 105/80 101/76  Pulse: (!) 103 (!) 103 (!) 101 95  Resp: 17 20 (!) 21 18  Temp:      TempSrc:  SpO2: 97% 99% 97% 96%  Weight:      Height:        Final diagnoses:  Acute pulmonary edema ([place])    Admission/ observation were discussed with the admitting physician, patient and/or family and they are comfortable with the plan.   Final Clinical  Impression(s) / ED Diagnoses Final diagnoses:  Acute pulmonary edema [facility])    Rx / DC Orders ED Discharge Orders    None       Deno Etienne, DO 09/13/20 1545

## 2020-09-13 NOTE — Telephone Encounter (Signed)
Pt did go to ER and has appt with Dr. Glori Bickers scheduled for tomorrow

## 2020-09-13 NOTE — H&P (Signed)
History and Physical    David Bradshaw YQM:[id number] DOB: [birth year] DOA: 09/13/2020  PCP: David Koch, NP Consultants:  David Bradshaw - GI; David Bradshaw Patient coming from:  Home - lives alone, son checks on him 3x/week; NOK: Daughter, [phone]; Son, [phone]  Chief Complaint: SOB, fall  HPI: David MILES Sr. is a 87 y.o. male with medical history significant of CVA; CAD; cirrhosis with hepatocellular CA; and carotid and aortic stenosis presenting with SOB, fall.  His son reports that he has known cirrhosis and they feel like the fluid from that and his heart are building up and causing SOB.  He has had ascites and over the last 3 weeks it seems to be getting worse.  He has been 10-13 pounds heavier than usual.  Also with LE edema.  Worsening SOB and DOE.  He has never had a paracentesis.  Also with ?ingrown toenail infection and sore on his foot.  His son called his PCP and they encouraged him to bring the patient to the ER.  He was in the process of bringing him to the ER and fell backwards and hit his head while losing his balance.  They called 911 and brought him in.  He is not on home O2.  He had home health but it stopped last week because he didn't think it was helping.  He had ERCP on 3/1 for choledocholithiasis; he had stone removal and sphincterotomy.  There is no plan for cholecystectomy due to his age and comorbidities.  He also has hepatocellular CA and was discussed at [facility]; since he is not a surgical or transplant candidate, the decision is to monitor with serial imaging without intervention for now.  EGD with small varices as well as gastric ulcer, esophagitis and they are considering repeat EGD.   Echo was performed on 3/14 and showed EF 50-55% and indeterminate diastolic dysfunction with stable mild-moderate AS.     ED Course:  Progressive SOB, worse overnight.   Fell this AM.  O2 sats low to mid 80s, placed on 3L.  Appears volume overloaded.   Marked LE edema, mild LE crackles.  COVID negative.  No CP, troponin 20.  Review of Systems: As per HPI; otherwise review of systems reviewed and negative.   Ambulatory Status:  Ambulates without assistance  COVID Vaccine Status:  None  Past Medical History:  Diagnosis Date  . Aortic stenosis    mild AS 01/2016 echo  . Carotid stenosis   . Chicken pox   . Choledocholithiasis   . Cirrhosis ([place])   . Coronary arteriosclerosis due to lipid rich plaque 01/30/2016  . DNR (do not resuscitate) 09/13/2020  . Edema   . Esophagitis   . Gastric ulcer   . Hepatocellular carcinoma ([place]) 07/05/2020  . Myocardial infarct ([place])    1988 and 1990  . Stroke [facility])     Past Surgical History:  Procedure Laterality Date  . BIOPSY  07/17/2020   Procedure: BIOPSY;  Surgeon: David Landmark Telford Nab., MD;  Location: [facility];  Service: Gastroenterology;;  . CARDIAC CATHETERIZATION N/A 02/04/2016   Procedure: Left Heart Cath and Coronary Angiography;  Surgeon: David Artist, MD;  Location: [facility];  Service: Cardiovascular;  Laterality: N/A;  . CAROTID ENDARTERECTOMY Right 02/14/2016  . CATARACT EXTRACTION    . ENDARTERECTOMY Right 02/14/2016   Procedure: RIGHT CAROTID ENDARTERECTOMY;  Surgeon: David Sandy, MD;  Location: [facility];  Service: Vascular;  Laterality: Right;  .  ERCP N/A 07/17/2020   Procedure: ENDOSCOPIC RETROGRADE CHOLANGIOPANCREATOGRAPHY (ERCP);  Surgeon: David Bradshaw., MD;  Location: [facility];  Service: Gastroenterology;  Laterality: N/A;  . IR RADIOLOGIST EVAL & MGMT  07/27/2020  . PATCH ANGIOPLASTY Right 02/14/2016   Procedure: [place]o X2;  Surgeon: David Sandy, MD;  Location: [facility];  Service: Vascular;  Laterality: Right;  . REMOVAL OF STONES  07/17/2020   Procedure: REMOVAL OF STONES;  Surgeon: David Bradshaw., MD;  Location: [facility];  Service: Gastroenterology;;  .  Joan Mayans  07/17/2020   Procedure: Joan Mayans;  Surgeon: David Bradshaw., MD;  Location: [facility];  Service: Gastroenterology;;  . testicle     right hydrocele    Social History   Socioeconomic History  . Marital status: Widowed    Spouse name: Not on file  . Number of children: 4  . Years of education: 45  . Highest education level: Not on file  Occupational History  . Occupation: Retired - Optician, dispensing  Tobacco Use  . Smoking status: Former Smoker    Packs/day: 1.00    Years: 35.00    Pack years: 35.00    Quit date: 07/13/1985    Years since quitting: 35.1  . Smokeless tobacco: Never Used  Vaping Use  . Vaping Use: Never used  Substance and Sexual Activity  . Alcohol use: No  . Drug use: No  . Sexual activity: Not on file  Other Topics Concern  . Not on file  Social History Narrative   Fun: Watching TV.    Denies abuse and feels safe at home.    Social Determinants of Health   Financial Resource Strain: Not on file  Food Insecurity: Not on file  Transportation Needs: Not on file  Physical Activity: Not on file  Stress: Not on file  Social Connections: Not on file  Intimate Partner Violence: Not on file    Allergies  Allergen Reactions  . Tape Other (See Comments)    SKIN IS VERY THIN- WILL TEAR EASILY!! PLEASE USE PAPER TAPE  . Atorvastatin Rash and Other (See Comments)    Patient started on Plavix and Lipitor at the same time and developed a rash  . Brilinta [Ticagrelor] Hives  . Plavix [Clopidogrel Bisulfate] Rash and Other (See Comments)    Patient started on plavix and lipitor at the same time and developed a rash    Family History  Problem Relation Age of Onset  . Heart attack Mother   . Heart disease Mother   . Bone cancer Father   . Cancer Father     Prior to Admission medications   Medication Sig Start Date End Date Taking? Authorizing Provider  aspirin EC 325 MG tablet Take 1 tablet (325 mg total) by mouth daily.  02/06/16   Elgergawy, Silver Huguenin, MD  furosemide (LASIX) 20 MG tablet Take 1 tablet (20 mg total) by mouth daily. For leg swelling. 08/21/20   Armbruster, Carlota Raspberry, MD  Multiple Vitamins-Minerals (ICAPS) TABS Take 1 tablet by mouth daily at 12 noon.    [provider]  omeprazole (PRILOSEC) 40 MG capsule Take 1 capsule (40 mg total) by mouth 2 (two) times daily before a meal. 07/17/20 07/17/21  Mansouraty, Telford Nab., MD  spironolactone (ALDACTONE) 50 MG tablet Take 1 tablet (50 mg total) by mouth daily. 08/21/20   Yetta Flock, MD    Physical Exam: Vitals:   09/13/20 1615 09/13/20 1630 09/13/20 1645  09/13/20 1700  BP: 101/78 96/80 99/69  108/78  Pulse: 92 94 94 (!) 102  Resp: 12 17 17  (!) 22  Temp:    97.6 F (36.4 C)  TempSrc:    Oral  SpO2: 94% 94% 95% 96%  Weight:      Height:         . General:  Appears calm and comfortable and is in NAD . Eyes:  PERRL, EOMI, normal lids, iris . ENT:  Hard of hearing, grossly normal lips & tongue, mmm; edentulous . Neck:  no LAD, masses or thyromegaly . Cardiovascular:  RR with mild tachycardia, no m/r/g. 3-4+ LE edema extending up thighs.  . Respiratory:   CTA bilaterally with no wheezes/rales/rhonchi.  Normal to mildly increased respiratory effort. . Abdomen:  Soft, significant fluid wave present with ascites . Skin:  no rash or induration seen on limited exam . Musculoskeletal:  grossly normal tone BUE/BLE, good ROM, no bony abnormality . Lower extremity:  Marked LE edema extending up to the thighs.  Limited foot exam with R medial great toe ingrown toenail as well as R lateral foot ulceration along the metatarsal head without apparent surrounding erythema/edema.  2+ distal pulses.       Marland Kitchen Psychiatric:  blunted mood and affect, speech fluent and appropriate, AOx3 . Neurologic:  CN 2-12 grossly intact, moves all extremities in coordinated fashion    Radiological Exams on Admission: Independently reviewed - see discussion in  A/P where applicable  CT Head Wo Contrast  Result Date: 09/13/2020 CLINICAL DATA:  Pain following fall EXAM: CT HEAD WITHOUT CONTRAST TECHNIQUE: Contiguous axial images were obtained from the base of the skull through the vertex without intravenous contrast. COMPARISON:  January 30, 2016 head CT and brain MRI. FINDINGS: Brain: Moderate diffuse atrophy is stable. There is no intracranial mass, hemorrhage, extra-axial fluid collection, or midline shift. There is evidence of a prior small infarct in the posterior mid left cerebellum, stable. There is evidence of a prior infarct in the right mid parietal lobe posteriorly, stable. There is decreased attenuation in periventricular white matter consistent with periventricular small vessel disease. No acute infarct is evident. Vascular: No hyperdense vessel. Calcification noted in each carotid siphon region. Skull: Bony calvarium appears intact. Stable 7 mm left frontal dural calcification, a likely small enostosis. No surrounding edema. This is a finding of no clinical significance. Sinuses/Orbits: There is slight mucosal thickening in several ethmoid air cells. Other visualized paranasal sinuses are clear. Orbits appear symmetric bilaterally. Other: Mastoid air cells are clear. IMPRESSION: Atrophy with periventricular small vessel disease. Prior infarct in the mid to posterior right parietal lobe and in the posterior mid left cerebellum. No acute infarct appreciable. No mass or hemorrhage. There are foci of arterial vascular calcification. There is mucosal thickening in several ethmoid air cells. Electronically Signed   By: Lowella Grip III M.D.   On: 09/13/2020 15:11   DG Chest Port 1 View  Result Date: 09/13/2020 CLINICAL DATA:  Shortness of breath. EXAM: PORTABLE CHEST 1 VIEW COMPARISON:  Chest x-ray dated June 06, 2020. FINDINGS: The heart size and mediastinal contours are within normal limits. Normal pulmonary vascularity. Emphysematous changes again  noted. Increasing small left pleural effusion with left basilar opacity. Unchanged scarring at the right lung base. No pneumothorax. No acute osseous abnormality. IMPRESSION: 1. Increasing small left pleural effusion with left basilar atelectasis versus infiltrate. Electronically Signed   By: Titus Dubin M.D.   On: 09/13/2020 13:56    EKG:  Independently reviewed from 1238 and 1245.  Sinus tachycardia with rate 113; prolonged QTc 575 -> 471; low voltage; nonspecific ST changes with no evidence of acute ischemia   Labs on Admission: I have personally reviewed the available labs and imaging studies at the time of the admission.  Pertinent labs:   Glucose 124 BUN 21/Creatinine 1.97/GFR 31; prior 20/1.48/41 on 3/17 AP 395 Albumin 2.5 AST 49/ALT 30/Bili 1.6 BNP 207.3 HS troponin 20 WBC 10.8 COVID/flu negative   Assessment/Plan Principal Problem:   Cirrhosis of liver with ascites (HCC) Active Problems:   Acute congestive heart failure (HCC)   Acute kidney injury superimposed on CKD (HCC)   Foot ulcer, right, limited to breakdown of skin (HCC)   Class 1 obesity due to excess calories with body mass index (BMI) of 32.0 to 32.9 in adult   DNR (do not resuscitate)   Cirrhosis with ascites -Patient with recent diagnosis of cryptogenic cirrhosis with prior known small varices and now marked ascites -Platelets and INR are normal, and so the patient appears to be ruled out for decompensated cirrhosis. -He also has hepatocellular carcinoma but is not a candidate for intervention at this time -MELD score is 15, with a mortality rate of 6% -Child-Pugh category is B, with an 80% 1-year survival -Ascites is the most common complication that leads to hospital admission -New-onset ascites requires diagnostic paracentesis -Recommend 2 gram sodium restricted diet with nutritional education; consult requested -Continue spironolactone and Lasix (change Lasix to 40 mg IV BID for now) -He is not a  candidate for liver transplant -With large-volume paracentesis >5L, albumin infusion of 6-8g/L removed improves survival and prevents post-paracentesis circulatory dysfunction  AKI on CKD, concerning for hepatorenal syndrome -Patient with known advanced liver disease from cryptogenic cirrhosis presenting with progressive SOB and volume overload -He appears to have baseline stage 3b CKD and would not desire HD should this progress -He also has new AKI on his chronic CKD that indicates consideration for development of hepatorenal syndrome; this will be more likely if the patient does not have creatinine improvement after 2 days of treatment. -I discussed the fact that this could indicate progression of his illness with a possibly poor outcome and he and his son voice understanding. -IR consultation requested for paracentesis -Will need PT/OT consultation -Likely to benefit from palliative care consultation and family is in agreement  Volume overload, known mild systolic CHF with possible exacerbation -Marked LE edema -He had recent echo with mild systolic dysfunction and this could be evidence of decompensated heart failure -However, this may also be related to ascites  -Will attempt ongoing diuresis and paracentesis but he appears to be developing multisystem organ failure  R foot wounds -Ingrown toenail and also lateral foot ulcer -Neither is overly concerning for infection at this time -Will request wound care consultation  H/o CVA/CAD -Hold ASA for now pending paracentesis  Obesity -Body mass index is 32.98 kg/m..  -Weight loss should be encouraged  DNR -I have discussed code status with the patient and his son and  they are in agreement that the patient would not desire resuscitation and would prefer to die a natural death should that situation arise. -He will need a gold out of facility DNR form at the time of discharge    Note: This patient has been tested and is negative  for the novel coronavirus COVID-19. The patient has NOT been vaccinated against COVID-19.   Level of care: Telemetry Cardiac DVT prophylaxis: SCDs Code Status:  DNR - confirmed with patient/family Family Communication: Son was present throughout evaluation. Disposition Plan:  The patient is from: home  Anticipated d/c is to: home, possibly with [facility] services  Anticipated d/c date will depend on clinical response to treatment, but possibly as early as tomorrow if he has excellent response to treatment  Patient is currently: acutely ill Consults called: IR; palliative care; PT/OT/TOC team/Wound care Admission status:  It is my clinical opinion that referral for OBSERVATION is reasonable and necessary in this patient based on the above information provided. The aforementioned taken together are felt to place the patient at high risk for further clinical deterioration. However it is anticipated that the patient may be medically stable for discharge from the hospital within 24 to 48 hours.    Karmen Bongo MD [facility]   How to contact the [facility] Attending or Consulting provider [place] or covering provider during after hours [place], for this patient?  1. Check the care team in [facility] and look for a) attending/consulting TRH provider listed and b) the [facility] team listed 2. Log into [email or web address] and use [place]'s universal password to access. If you do not have the password, please contact the hospital operator. 3. Locate the [facility] provider you are looking for under [facility] and page to a number that you can be directly reached. 4. If you still have difficulty reaching the provider, please page the [facility] (Director on Call) for the Hospitalists listed on amion for assistance.   09/13/2020, 6:10 PM

## 2020-09-13 NOTE — ED Notes (Signed)
Attempted report x1. 

## 2020-09-13 NOTE — Telephone Encounter (Signed)
Aware, will watch for correspondence  Please check in with him tomorrow if he does not end up admitted to hospital  Will cc to pcp

## 2020-09-13 NOTE — Telephone Encounter (Signed)
I was unable to speak with Kevin,pts son(DPR signed) but I did speak with Kevins wife who will have kevin call [facility] ASAP. Left v/m on pts home # for [name] to call [facility]. [name] returned the call and he said he spoke with [facility] triage nurse and was advised to take pt to ED. Pt also has appt on 09/14/20 at 10 AM for infected toe and sore on foot. [name] will ask ED doctor about the foot and toe also and if does not need the appt on 09/14/20 [name] will cb and cancel. Pt does have swelling in abd,legs and feet and SOB which is worse upon any exertion. [name] declined 911 and is taking pt to [facility] ED now. Sending note to Gentry Fitz NP who is out of office and Dr Glori Bickers who is in office.

## 2020-09-13 NOTE — Plan of Care (Signed)

## 2020-09-14 ENCOUNTER — Inpatient Hospital Stay (HOSPITAL_COMMUNITY): Payer: Medicare HMO

## 2020-09-14 ENCOUNTER — Encounter (HOSPITAL_COMMUNITY): Payer: Medicare HMO

## 2020-09-14 ENCOUNTER — Ambulatory Visit: Payer: Self-pay | Admitting: Vascular Surgery

## 2020-09-14 ENCOUNTER — Ambulatory Visit: Payer: Medicare HMO | Admitting: Family Medicine

## 2020-09-14 DIAGNOSIS — L97511 Non-pressure chronic ulcer of other part of right foot limited to breakdown of skin: Secondary | ICD-10-CM | POA: Diagnosis not present

## 2020-09-14 DIAGNOSIS — N1832 Chronic kidney disease, stage 3b: Secondary | ICD-10-CM | POA: Diagnosis present

## 2020-09-14 DIAGNOSIS — R188 Other ascites: Secondary | ICD-10-CM

## 2020-09-14 DIAGNOSIS — I714 Abdominal aortic aneurysm, without rupture: Secondary | ICD-10-CM | POA: Diagnosis present

## 2020-09-14 DIAGNOSIS — E440 Moderate protein-calorie malnutrition: Secondary | ICD-10-CM | POA: Diagnosis not present

## 2020-09-14 DIAGNOSIS — Z79899 Other long term (current) drug therapy: Secondary | ICD-10-CM | POA: Diagnosis not present

## 2020-09-14 DIAGNOSIS — I509 Heart failure, unspecified: Secondary | ICD-10-CM | POA: Diagnosis not present

## 2020-09-14 DIAGNOSIS — I5022 Chronic systolic (congestive) heart failure: Secondary | ICD-10-CM | POA: Diagnosis present

## 2020-09-14 DIAGNOSIS — K7460 Unspecified cirrhosis of liver: Secondary | ICD-10-CM | POA: Diagnosis not present

## 2020-09-14 DIAGNOSIS — Z6832 Body mass index (BMI) 32.0-32.9, adult: Secondary | ICD-10-CM | POA: Diagnosis not present

## 2020-09-14 DIAGNOSIS — I5023 Acute on chronic systolic (congestive) heart failure: Secondary | ICD-10-CM | POA: Diagnosis not present

## 2020-09-14 DIAGNOSIS — J810 Acute pulmonary edema: Secondary | ICD-10-CM | POA: Diagnosis not present

## 2020-09-14 DIAGNOSIS — Z20822 Contact with and (suspected) exposure to covid-19: Secondary | ICD-10-CM | POA: Diagnosis not present

## 2020-09-14 DIAGNOSIS — I5021 Acute systolic (congestive) heart failure: Secondary | ICD-10-CM | POA: Diagnosis not present

## 2020-09-14 DIAGNOSIS — Z789 Other specified health status: Secondary | ICD-10-CM

## 2020-09-14 DIAGNOSIS — Z8673 Personal history of transient ischemic attack (TIA), and cerebral infarction without residual deficits: Secondary | ICD-10-CM | POA: Diagnosis not present

## 2020-09-14 DIAGNOSIS — R54 Age-related physical debility: Secondary | ICD-10-CM | POA: Diagnosis present

## 2020-09-14 DIAGNOSIS — Z888 Allergy status to other drugs, medicaments and biological substances status: Secondary | ICD-10-CM | POA: Diagnosis not present

## 2020-09-14 DIAGNOSIS — Z7982 Long term (current) use of aspirin: Secondary | ICD-10-CM | POA: Diagnosis not present

## 2020-09-14 DIAGNOSIS — E6609 Other obesity due to excess calories: Secondary | ICD-10-CM | POA: Diagnosis present

## 2020-09-14 DIAGNOSIS — Z7189 Other specified counseling: Secondary | ICD-10-CM

## 2020-09-14 DIAGNOSIS — C220 Liver cell carcinoma: Secondary | ICD-10-CM | POA: Diagnosis not present

## 2020-09-14 DIAGNOSIS — Z515 Encounter for palliative care: Secondary | ICD-10-CM | POA: Diagnosis not present

## 2020-09-14 DIAGNOSIS — N189 Chronic kidney disease, unspecified: Secondary | ICD-10-CM | POA: Diagnosis not present

## 2020-09-14 DIAGNOSIS — K7469 Other cirrhosis of liver: Secondary | ICD-10-CM | POA: Diagnosis not present

## 2020-09-14 DIAGNOSIS — Z66 Do not resuscitate: Secondary | ICD-10-CM | POA: Diagnosis not present

## 2020-09-14 DIAGNOSIS — Z87891 Personal history of nicotine dependence: Secondary | ICD-10-CM | POA: Diagnosis not present

## 2020-09-14 DIAGNOSIS — W010XXA Fall on same level from slipping, tripping and stumbling without subsequent striking against object, initial encounter: Secondary | ICD-10-CM | POA: Diagnosis not present

## 2020-09-14 DIAGNOSIS — I252 Old myocardial infarction: Secondary | ICD-10-CM | POA: Diagnosis not present

## 2020-09-14 DIAGNOSIS — I2510 Atherosclerotic heart disease of native coronary artery without angina pectoris: Secondary | ICD-10-CM | POA: Diagnosis present

## 2020-09-14 DIAGNOSIS — N179 Acute kidney failure, unspecified: Secondary | ICD-10-CM | POA: Diagnosis not present

## 2020-09-14 DIAGNOSIS — K7581 Nonalcoholic steatohepatitis (NASH): Secondary | ICD-10-CM | POA: Diagnosis present

## 2020-09-14 HISTORY — PX: IR PARACENTESIS: IMG2679

## 2020-09-14 LAB — COMPREHENSIVE METABOLIC PANEL
ALT: 25 U/L (ref 0–44)
AST: 32 U/L (ref 15–41)
Albumin: 2.1 g/dL — ABNORMAL LOW (ref 3.5–5.0)
Alkaline Phosphatase: 306 U/L — ABNORMAL HIGH (ref 38–126)
Anion gap: 7 (ref 5–15)
BUN: 22 mg/dL (ref 8–23)
CO2: 28 mmol/L (ref 22–32)
Calcium: 8.1 mg/dL — ABNORMAL LOW (ref 8.9–10.3)
Chloride: 105 mmol/L (ref 98–111)
Creatinine, Ser: 1.84 mg/dL — ABNORMAL HIGH (ref 0.61–1.24)
GFR, Estimated: 34 mL/min — ABNORMAL LOW (ref >60)
Glucose, Bld: 90 mg/dL (ref 70–99)
Potassium: 4 mmol/L (ref 3.5–5.1)
Sodium: 140 mmol/L (ref 135–145)
Total Bilirubin: 1.6 mg/dL — ABNORMAL HIGH (ref 0.3–1.2)
Total Protein: 5.3 g/dL — ABNORMAL LOW (ref 6.5–8.1)

## 2020-09-14 LAB — CBC
HCT: 44.9 % (ref 39.0–52.0)
Hemoglobin: 15 g/dL (ref 13.0–17.0)
MCH: 32 pg (ref 26.0–34.0)
MCHC: 33.4 g/dL (ref 30.0–36.0)
MCV: 95.7 fL (ref 80.0–100.0)
Platelets: 175 10*3/uL (ref 150–400)
RBC: 4.69 MIL/uL (ref 4.22–5.81)
RDW: 14.1 % (ref 11.5–15.5)
WBC: 9.1 10*3/uL (ref 4.0–10.5)
nRBC: 0 % (ref 0.0–0.2)

## 2020-09-14 LAB — PROTIME-INR
INR: 1.1 (ref 0.8–1.2)
Prothrombin Time: 14.1 seconds (ref 11.4–15.2)

## 2020-09-14 MED ORDER — LIDOCAINE HCL 1 % IJ SOLN
INTRAMUSCULAR | Status: DC | PRN
  Administered 2020-09-14 (×2): 10 mL

## 2020-09-14 MED ORDER — ALBUMIN HUMAN 25 % IV SOLN
25.0000 g | Freq: Once | INTRAVENOUS | Status: AC
  Administered 2020-09-14: 25 g via INTRAVENOUS
  Filled 2020-09-14: qty 100

## 2020-09-14 MED ORDER — ENSURE ENLIVE PO LIQD
237.0000 mL | Freq: Two times a day (BID) | ORAL | Status: DC
  Administered 2020-09-15 – 2020-09-20 (×10): 237 mL via ORAL

## 2020-09-14 MED ORDER — LIDOCAINE HCL 1 % IJ SOLN
INTRAMUSCULAR | Status: AC
  Filled 2020-09-14: qty 20

## 2020-09-14 MED ORDER — ADULT MULTIVITAMIN W/MINERALS CH
1.0000 | ORAL_TABLET | Freq: Every day | ORAL | Status: DC
  Administered 2020-09-14 – 2020-09-20 (×7): 1 via ORAL
  Filled 2020-09-14 (×7): qty 1

## 2020-09-14 NOTE — Evaluation (Signed)
Physical Therapy Evaluation Patient Details Name: David Bradshaw. MRN: [id number] DOB: 1926/09/23 Today's Date: 09/14/2020   History of Present Illness  87 y.o. male presents to [facility] ED on 09/13/2020 with SOB and fall. Pt admitted with ascites, volume overload, and R foot wounds. Past medical history significant of CVA; CAD; cirrhosis with hepatocellular CA; and carotid and aortic stenosis.  Clinical Impression  Pt presents to PT with deficits in functional mobility, gait, balance, endurance, cardiopulmonary function. Pt is unsteady when mobilizing, requiring UE support of walker to prevent falls. Pt reports dizzy spell upon sitting in recliner which is very brief and passes quickly, no other symptoms during session. Pt will benefit from aggressive mobilization to improve balance and activity tolerance. PT recommends discharge home with HHPT, use of walker, and assistance from family for all OOB mobility initially. If pt does not have assistance from family for all OOB mobility initially then may need to consider SNF placement.    Follow Up Recommendations Home health PT;Supervision for mobility/OOB (pt may need SNF if family unable to assist for OOB initially upon return home)    Equipment Recommendations  None recommended by PT    Recommendations for Other Services       Precautions / Restrictions Precautions Precautions: Fall Restrictions Weight Bearing Restrictions: No      Mobility  Bed Mobility Overal bed mobility: Needs Assistance Bed Mobility: Supine to Sit     Supine to sit: Min assist;HOB elevated          Transfers Overall transfer level: Needs assistance Equipment used: None;Rolling walker (2 wheeled) Transfers: Sit to/from Stand Sit to Stand: Min assist;Min guard (minA without walker, minG with walker)            Ambulation/Gait Ambulation/Gait assistance: Min assist;Min guard Gait Distance (Feet): 30 Feet (additional 15' without walker) Assistive  device: Rolling walker (2 wheeled);1 person hand held assist Gait Pattern/deviations: Step-to pattern;Drifts right/left;Staggering right;Staggering left Gait velocity: reduced Gait velocity interpretation: <1.31 ft/sec, indicative of household ambulator General Gait Details: pt with short step-to gait, pt staggering left and right without UE support, balance much improved with UE support of RW, pt with some difficulty managing RW in very tight quarters  Stairs            Wheelchair Mobility    Modified Rankin (Stroke Patients Only)       Balance Overall balance assessment: Needs assistance Sitting-balance support: No upper extremity supported;Feet supported Sitting balance-Leahy Scale: Good     Standing balance support: Single extremity supported;Bilateral upper extremity supported Standing balance-Leahy Scale: Poor Standing balance comment: reliant on UE support of RW                             Pertinent Vitals/Pain Pain Assessment: No/denies pain    Home Living Family/patient expects to be discharged to:: Private residence Living Arrangements: Alone Available Help at Discharge: Family;Available PRN/intermittently (sons) Type of Home: House Home Access: Stairs to enter Entrance Stairs-Rails: None Entrance Stairs-Number of Steps: 1 Home Layout: One level Home Equipment: Walker - 2 wheels;Cane - single point      Prior Function Level of Independence: Independent               Hand Dominance        Extremity/Trunk Assessment   Upper Extremity Assessment Upper Extremity Assessment: Overall WFL for tasks assessed    Lower Extremity Assessment Lower Extremity Assessment: Generalized weakness  Cervical / Trunk Assessment Cervical / Trunk Assessment: Normal  Communication   Communication: No difficulties  Cognition Arousal/Alertness: Awake/alert Behavior During Therapy: WFL for tasks assessed/performed Overall Cognitive Status: Within  Functional Limits for tasks assessed                                        General Comments General comments (skin integrity, edema, etc.): pt on 3L [place], PT weans to RA with desat to 86%. Pt requiring 3L [place] to maintain sats at or above 92%    Exercises     Assessment/Plan    PT Assessment Patient needs continued PT services  PT Problem List Decreased strength;Decreased balance;Decreased activity tolerance;Decreased mobility;Decreased knowledge of use of DME;Cardiopulmonary status limiting activity       PT Treatment Interventions DME instruction;Gait training;Functional mobility training;Stair training;Therapeutic activities;Therapeutic exercise;Balance training;Patient/family education    PT Goals (Current goals can be found in the Care Plan section)  Acute Rehab PT Goals Patient Stated Goal: to reduce falls risk PT Goal Formulation: With patient Time For Goal Achievement: 09/28/20 Potential to Achieve Goals: Good    Frequency Min 3X/week   Barriers to discharge        Co-evaluation               AM-PAC PT "6 Clicks" Mobility  Outcome Measure Help needed turning from your back to your side while in a flat bed without using bedrails?: A Little Help needed moving from lying on your back to sitting on the side of a flat bed without using bedrails?: A Little Help needed moving to and from a bed to a chair (including a wheelchair)?: A Little Help needed standing up from a chair using your arms (e.g., wheelchair or bedside chair)?: A Little Help needed to walk in hospital room?: A Little Help needed climbing 3-5 steps with a railing? : A Lot 6 Click Score: 17    End of Session Equipment Utilized During Treatment: Oxygen Activity Tolerance: Patient tolerated treatment well Patient left: in chair;with call bell/phone within reach;with chair alarm set Nurse Communication: Mobility status PT Visit Diagnosis: Unsteadiness on feet (R26.81);Muscle weakness  (generalized) (M62.81)    Time: 7253-6644 PT Time Calculation (min) (ACUTE ONLY): 23 min   Charges:   PT Evaluation $PT Eval Moderate Complexity: 1 Mod          [name], PT, DPT Acute Rehabilitation Pager: [phone]   [name] 09/14/2020, 10:00 AM

## 2020-09-14 NOTE — Procedures (Signed)
PROCEDURE SUMMARY:  Successful US guided paracentesis from left lateral abdomen.  Yielded 3.4 liters of yellow fluid.  No immediate complications, although patient with soft BP.  Procedure stopped prior to removal of all fluid due to hypotension.  RN notified.  Pt tolerated well.   Specimen was not sent for labs.  EBL < 26mL  Docia Barrier PA-C 09/14/2020 5:16 PM

## 2020-09-14 NOTE — Consult Note (Signed)
[facility] Nurse Consult Note: Patient receiving care in [place]. Reason for Consult: RLE wounds--see photos from this admission Wound type: superficial wound to right lateral 5th metatarsal head Pressure Injury POA: Yes/No/NA Measurement: To be provided by the bedside RN in the flowsheet section Wound bed: pink Drainage (amount, consistency, odor)  Periwound: intact Dressing procedure/placement/frequency:  Wash wound on right lateral foot close to the fifth toe with soap and water. Pat dry. Place a small foam dressing over it. Evaluate the area each day. Change the dressing every 3 days and prn. Monitor the wound area(s) for worsening of condition such as: Signs/symptoms of infection,  Increase in size,  Development of or worsening of odor, Development of pain, or increased pain at the affected locations.  Notify the medical team if any of these develop.  Thank you for the consult. [facility] nurse will not follow at this time.  Please re-consult the [facility] team if needed.  Val Riles, RN, MSN, CWOCN, CNS-BC, pager [phone]

## 2020-09-14 NOTE — Evaluation (Signed)
Occupational Therapy Evaluation Patient Details Name: David YEARWOOD Sr. MRN: [id number] DOB: April 06, 1927 Today's Date: 09/14/2020    History of Present Illness 87 y.o. male presents to [facility] ED on 09/13/2020 with SOB and fall. Pt admitted with ascites, volume overload, and R foot wounds. Past medical history significant of CVA; CAD; cirrhosis with hepatocellular CA; and carotid and aortic stenosis.   Clinical Impression   Patient admitted for the diagnosis above.  PTA he was living alone with intermittent assist from family.  The patient has macular degeneration, but does well in his own environment.  Cueing provided for the new hospital environment.  Deficts are noted below.  In addition, he is desaturating on RA.  Attempted to have patient sit EOB on RA, but he quickly desaturated to 84%, and O2 at 3L was placed.  Currently he is experiencing some type of dizziness/vertigo with positional changes.  Patient sat up in bed and needed mod A to stop from falling to the R side and landing on the floor.  This dizzy spell passed quickly, but did limit out of bed and functional assessment.  Patient will be followed by acute OT to maximize functional status for an eventual return home.  The patient is open to [facility] services at home.  Adjustments to discharge disposition could be made depending on continued dizziness and progress.      Follow Up Recommendations  Home health OT    Equipment Recommendations  Tub/shower seat    Recommendations for Other Services       Precautions / Restrictions Precautions Precautions: Fall Restrictions Weight Bearing Restrictions: No Other Position/Activity Restrictions: Male pure wick      Mobility Bed Mobility Overal bed mobility: Needs Assistance Bed Mobility: Supine to Sit     Supine to sit: Min assist;HOB elevated       Patient Response: Cooperative  Transfers Overall transfer level: Needs assistance Equipment used: None;Rolling walker (2  wheeled) Transfers: Sit to/from Stand Sit to Stand: Min assist              Balance Overall balance assessment: Needs assistance Sitting-balance support: No upper extremity supported;Feet supported Sitting balance-Leahy Scale: Good     Standing balance support: Single extremity supported;Bilateral upper extremity supported Standing balance-Leahy Scale: Poor Standing balance comment: reliant on UE support of RW                           ADL either performed or assessed with clinical judgement   ADL Overall ADL's : Needs assistance/impaired     Grooming: Wash/dry hands;Wash/dry face;Sitting;Supervision/safety           Upper Body Dressing : Supervision/safety;Sitting   Lower Body Dressing: Supervision/safety;Sitting/lateral leans                       Vision Baseline Vision/History: Macular Degeneration Patient Visual Report: No change from baseline                  Pertinent Vitals/Pain Pain Assessment: No/denies pain     Hand Dominance Right   Extremity/Trunk Assessment Upper Extremity Assessment Upper Extremity Assessment: Overall WFL for tasks assessed   Lower Extremity Assessment Lower Extremity Assessment: Defer to PT evaluation   Cervical / Trunk Assessment Cervical / Trunk Assessment: Normal   Communication Communication Communication: No difficulties;HOH   Cognition Arousal/Alertness: Awake/alert Behavior During Therapy: WFL for tasks assessed/performed Overall Cognitive Status: Within Functional Limits for tasks assessed  Home Living Family/patient expects to be discharged to:: Private residence Living Arrangements: Alone Available Help at Discharge: Family;Available PRN/intermittently Type of Home: House Home Access: Stairs to enter [employer] of Steps: 1 Entrance Stairs-Rails: None Home Layout: One level     Bathroom  Shower/Tub: Occupational psychologist: Standard     Home Equipment: Environmental consultant - 2 wheels;Cane - single point          Prior Functioning/Environment Level of Independence: Independent                 OT Problem List: Decreased activity tolerance;Impaired balance (sitting and/or standing)      OT Treatment/Interventions: Self-care/ADL training;DME and/or AE instruction;Therapeutic activities;Balance training    OT Goals(Current goals can be found in the care plan section) Acute Rehab OT Goals Patient Stated Goal: Goal is to return home OT Goal Formulation: With patient Time For Goal Achievement: 09/28/20 Potential to Achieve Goals: Good ADL Goals Pt Will Perform Grooming: with modified independence;standing Pt Will Perform Lower Body Bathing: with modified independence;sit to/from stand Pt Will Perform Lower Body Dressing: with modified independence;sit to/from stand Pt Will Transfer to Toilet: with modified independence;ambulating;regular height toilet Pt Will Perform Toileting - Clothing Manipulation and hygiene: with modified independence;sit to/from stand  OT Frequency: Min 2X/week   Barriers to D/C:    none noted       Co-evaluation              AM-PAC OT "6 Clicks" Daily Activity     Outcome Measure Help from another person eating meals?: None Help from another person taking care of personal grooming?: None Help from another person toileting, which includes using toliet, bedpan, or urinal?: A Little Help from another person bathing (including washing, rinsing, drying)?: A Little Help from another person to put on and taking off regular upper body clothing?: A Little Help from another person to put on and taking off regular lower body clothing?: A Little 6 Click Score: 20   End of Session Equipment Utilized During Treatment: Rolling walker Nurse Communication: Other (comment) (? vertigo)  Activity Tolerance: Other (comment) (limited by sudden  onset of dizziness) Patient left: in bed;with call bell/phone within reach;with bed alarm set  OT Visit Diagnosis: Unsteadiness on feet (R26.81);Dizziness and giddiness (R42)                Time: 0488-8916 OT Time Calculation (min): 17 min Charges:  OT General Charges $OT Visit: 1 Visit OT Evaluation $OT Eval Moderate Complexity: 1 Mod  09/14/2020  Rich, OTR/L  Acute Rehabilitation Services  Office:  [phone]   Metta Clines 09/14/2020, 11:29 AM

## 2020-09-14 NOTE — Progress Notes (Signed)
Initial Nutrition Assessment  DOCUMENTATION CODES:   Non-severe (moderate) malnutrition in context of chronic illness  INTERVENTION:   -Ensure Enlive po BID, each supplement provides 350 kcal and 20 grams of protein -MVI with minerals daily  NUTRITION DIAGNOSIS:   Moderate Malnutrition related to chronic illness (cirrhosis and hepatocellular cancer) as evidenced by estimated needs.  GOAL:   Patient will meet greater than or equal to 90% of their needs  MONITOR:   PO intake,Supplement acceptance,Diet advancement,Labs,Weight trends,Skin,I & O's  REASON FOR ASSESSMENT:   Consult Assessment of nutrition requirement/status  ASSESSMENT:   David ENFIELD Sr. is a 87 y.o. male with medical history significant of CVA; CAD; cirrhosis with hepatocellular CA; and carotid and aortic stenosis presenting with SOB, fall.  Pt admitted with cirrhosis with ascites.   Reviewed I/O's: -935 ml x 24 hours and -935 ml since admission  UOP: 1.2 L x 24 hours  Spoke with pt at bedside, who reports he is upset that he can't eat due to upcoming test. PTA he reports great appetite- he consumes 3 meals per day (Breakfast: breakfast sandwich or pop tarts; Lunch: frozen dinner; Dinner: frozen dinner). Pt reports he lives alone and his sons grocery shop for him. He consumes mostly frozen items, as they are easy for him to heat up in the microwave.   Pt shares that his UBW is around 150#. He endorses wt gain related to fluid overload and ascites. Suspect fluid is masking true weight loss as well as fat and muscle depletions.   Per pt, He usually gets around well at home, but had a fall just PTA.   Discussed importance of good meal and supplement intake to promote healing.   Medications reviewed and include colace and lasix.   Labs reviewed.   NUTRITION - FOCUSED PHYSICAL EXAM:  Flowsheet Row Most Recent Value  Orbital Region Mild depletion  Upper Arm Region Mild depletion  Thoracic and Lumbar  Region No depletion  Buccal Region Mild depletion  Temple Region Mild depletion  Clavicle Bone Region Moderate depletion  Clavicle and Acromion Bone Region Mild depletion  Scapular Bone Region No depletion  Dorsal Hand No depletion  Patellar Region No depletion  Anterior Thigh Region No depletion  Posterior Calf Region No depletion  Edema (RD Assessment) Mild  Hair Reviewed  Eyes Reviewed  Mouth Reviewed  Skin Reviewed  Nails Reviewed       Diet Order:   Diet Order            Diet Heart Room service appropriate? Yes; Fluid consistency: Thin; Fluid restriction: 1500 mL Fluid  Diet effective now                 EDUCATION NEEDS:   Education needs have been addressed  Skin:  Skin Assessment: Reviewed RN Assessment  Last BM:  09/12/20  Height:   Ht Readings from Last 1 Encounters:  09/13/20 5' (1.524 m)    Weight:   Wt Readings from Last 1 Encounters:  09/14/20 76.7 kg    Ideal Body Weight:  48.2 kg  BMI:  Body mass index is 33.02 kg/m.  Estimated Nutritional Needs:   Kcal:  1700-1900  Protein:  95-110 grams  Fluid:  > 1.7 L    Loistine Chance, RD, LDN, [place] Registered Dietitian II Certified Diabetes Care and Education Specialist Please refer to [facility] for RD and/or RD on-call/weekend/after hours pager

## 2020-09-14 NOTE — Consult Note (Signed)
Consultation Note Date: 09/14/2020   Patient Name: David Bradshaw  DOB: 26-May-1927  MRN: [id number]  Age / Sex: 87 y.o., male  PCP: Pleas Koch, NP Referring Physician: Barb Merino, MD  Reason for Consultation: Establishing goals of care  HPI/Patient Profile: 87 y.o. male  with past medical history of stroke, CAD, Karlene Lineman cirrhosis, systolic CHF, recent diagnosis of hepatocellular carcinoma 06/2020 presented to the ED on 3/31 from home with complains of shortness of breath and LE edema. On his way to the ED, he lost balance and fell backwards when he was trying to get to the car. Patient was admitted on 09/13/2020 with cirrhosis with ascites, AKI on CKD (concerning for hepatorenal syndrome), volume overload with known mild systolic CHF with possible exacerbation, and right foot wounds. GI was consulted and feel his NASH cirrhosis does not seem that decompensated.  They recommend [facility] treatment with observation at this time given age and comorbidities.    ED Course:  Progressive SOB, worse overnight.   Fell this AM.  O2 sats low to mid 80s, placed on 3L.  Appears volume overloaded.  Marked LE edema, mild LE crackles.  COVID negative.  No CP, troponin 20.  Clinical Assessment and Goals of Care: I have reviewed medical records including EPIC notes, labs, and imaging. Received report from primary RN - no acute concerns. Patient underwent paracentesis today.  Went to visit patient at bedside - no family/visitors present. Patient was lying in bed awake, alert, oriented, and able to participate in conversation. No signs or non-verbal gestures of pain or discomfort noted. No respiratory distress, increased work of breathing, or secretions noted. Patient denies pain or shortness of breath at this time. Patient states he feels better today than yesterday; but overall did not feel "bad" and cannot tell a difference  after paracentesis.    Met with patient  to discuss diagnosis, prognosis, GOC, EOL wishes, disposition, and options.  I introduced Palliative Medicine as specialized medical care for people living with serious illness. It focuses on providing relief from the symptoms and stress of a serious illness. The goal is to improve quality of life for both the patient and the family.  We discussed a brief life review of the patient as well as functional and nutritional status. 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 Unfortunately, his wife passed away 22 years ago. They had 4 children together - 3 boys and 1 girl; one of the boys has passed. Prior to hospitalization, David Bradshaw was living in a private residence alone. He explains his son checks on him three times per week and brings him groceries every Saturday. He also has neighbors who check on him each day/every other day. He does not have any HH services/aids. David Bradshaw states his appetite has been well and he eats/drinks "fine." Albumin was noted to be 2.1 on 09/14/2020. David Bradshaw explains he was first diagnosed with cancer in January 2022. He was told it is "slow growing" and  he would likely outlive it. He is scheduled to have a follow up/check in 4 months for monitoring. David Bradshaw was not recommended any treatment for his cancer, just watching/monitoring.   We discussed patient's current illness and what it means in the larger context of patient's on-going co-morbidities. David Bradshaw has a clear understanding of his current medical situation. He understands that CKD and CHF are progressive, non-curable disease underlying the patient's current acute medical conditions. We discussed his worsening CKD - David Bradshaw would not be interested in HD, even if offered. He understands he is a poor candidate for HD. Natural disease trajectory and expectations at EOL were discussed. I attempted to elicit values and goals of care important to the  patient. The difference between aggressive medical intervention and comfort care was considered in light of the patient's goals of care. At this time, David Bradshaw is open to continued monitoring for his cancer and MRCP as already scheduled outpatient. He is also open to receiving HHPT vs rehab. We discussed his need for additional supervision at this time since he lives alone at home. He asked for time to discuss this with his son/David. Patient understands he is at high risk of rehospitalization. He would not want aggressive interventions to prolong his life.   Palliative Care services outpatient were explained and offered - patient was agreeable.  Advance directives, concepts specific to code status, artificial feeding and hydration, and rehospitalization were considered and discussed. David Bradshaw tells me he does have a Living Will and HCPOA - states son/David Bradshaw is HCPOA - requested family member bring to hospital if able so we can obtain a copy. Patient confirms his desire for natural death with DNR/DNI. Introduced, reviewed, and completed MOST form as outlined under recommendation section below.  Discussed with patient the importance of continued conversation with family and the medical providers regarding overall plan of care and treatment options, ensuring decisions are within the context of the patient's values and GOCs.    Questions and concerns were addressed. The patient/family was encouraged to call with questions and/or concerns. PMT card was provided.    Primary Decision Maker: PATIENT  If patient unable to make decisions - son/HCPOA/David Bradshaw (need to obtain copy of HCPOA document)    SUMMARY OF RECOMMENDATIONS    Continue current medical treatment  Continue DNR/DNI as previously documented - durable DNR form placed in shadow chart; copy made and will be scanned into Vynca/ACP tab  Patient's goal is to improve and return home; he understands he may need more supervision at  home, is open to PT, but would need to discuss with sons about further options if indicated. Between sons and neighbors, patient is checked on daily  Patient is open to completing outpatient MRCP as previously scheduled  Patient would not want to pursue dialysis if ever offered; he understands he is a poor candidate  MOST form completed as follows: DNR, Limited Additional Interventions (Do not intubate), No antibiotics, No IVF, No feeding tube. Original placed on shadow chart; copy made and will be scanned into Vynca/ACP tab  [facility] consulted for: outpatient Palliative Care referral  Obtain copies of Living Will and HCPOA if able - requested copies - per patient son/David is HCPOA  PMT will continue to follow peripherally. If there are any imminent needs please call the service directly   Code Status/Advance Care Planning:  DNR  Palliative Prophylaxis:   Aspiration, Bowel Regimen, Delirium Protocol, Frequent Pain Assessment, Oral Care and Turn Reposition  Additional Recommendations (Limitations, Scope, Preferences):  No Artificial Feeding, No Hemodialysis and No Tracheostomy  Psycho-social/Spiritual:   Desire for further Chaplaincy support:no Created space and opportunity for patient and family to express thoughts and feelings regarding patient's current medical situation.   Emotional support and therapeutic listening provided.  Prognosis:   Unable to determine  Discharge Planning: To Be Determined      Primary Diagnoses: Present on Admission: . Cirrhosis of liver with ascites ([place]) . Acute kidney injury superimposed on CKD ([place]) . Foot ulcer, right, limited to breakdown of skin ([place]) . DNR (do not resuscitate)   I have reviewed the medical record, interviewed the patient and family, and examined the patient. The following aspects are pertinent.  Past Medical History:  Diagnosis Date  . Aortic stenosis    mild AS 01/2016 echo  . Carotid stenosis   . Chicken pox    . Choledocholithiasis   . Cirrhosis ([place])   . Coronary arteriosclerosis due to lipid rich plaque 01/30/2016  . DNR (do not resuscitate) 09/13/2020  . Edema   . Esophagitis   . Gastric ulcer   . Hepatocellular carcinoma ([place]) 07/05/2020  . Myocardial infarct ([place])    1988 and 1990  . Stroke [facility])    Social History   Socioeconomic History  . Marital status: Widowed    Spouse name: Not on file  . Number of children: 4  . Years of education: 63  . Highest education level: Not on file  Occupational History  . Occupation: Retired - Optician, dispensing  Tobacco Use  . Smoking status: Former Smoker    Packs/day: 1.00    Years: 35.00    Pack years: 35.00    Quit date: 07/13/1985    Years since quitting: 35.1  . Smokeless tobacco: Never Used  Vaping Use  . Vaping Use: Never used  Substance and Sexual Activity  . Alcohol use: No  . Drug use: No  . Sexual activity: Not on file  Other Topics Concern  . Not on file  Social History Narrative   Fun: Watching TV.    Denies abuse and feels safe at home.    Social Determinants of Health   Financial Resource Strain: Not on file  Food Insecurity: Not on file  Transportation Needs: Not on file  Physical Activity: Not on file  Stress: Not on file  Social Connections: Not on file   Family History  Problem Relation Age of Onset  . Heart attack Mother   . Heart disease Mother   . Bone cancer Father   . Cancer Father    Scheduled Meds: . docusate sodium  100 mg Oral BID  . [START ON 09/15/2020] feeding supplement  237 mL Oral BID BM  . furosemide  40 mg Intravenous BID  . lidocaine      . multivitamin with minerals  1 tablet Oral Daily  . pantoprazole  40 mg Oral BID  . sodium chloride flush  3 mL Intravenous Q12H  . spironolactone  50 mg Oral Daily   Continuous Infusions: PRN Meds:.acetaminophen **OR** acetaminophen, bisacodyl, lidocaine, ondansetron **OR** ondansetron (ZOFRAN) IV, oxyCODONE, polyethylene glycol Medications  Prior to Admission:  Prior to Admission medications   Medication Sig Start Date End Date Taking? Authorizing Provider  furosemide (LASIX) 20 MG tablet Take 1 tablet (20 mg total) by mouth daily. For leg swelling. Patient taking differently: Take 20 mg by mouth in the morning. 08/21/20  Yes Armbruster, Carlota Raspberry, MD  Multiple Vitamins-Minerals (ICAPS  AREDS FORMULA PO) Take 1 capsule by mouth 2 (two) times daily.   Yes [provider]  omeprazole (PRILOSEC) 40 MG capsule Take 1 capsule (40 mg total) by mouth 2 (two) times daily before a meal. 07/17/20 07/17/21 Yes Mansouraty, Telford Nab., MD  spironolactone (ALDACTONE) 50 MG tablet Take 1 tablet (50 mg total) by mouth daily. Patient taking differently: Take 50 mg by mouth in the morning. 08/21/20  Yes Armbruster, Carlota Raspberry, MD  aspirin EC 325 MG tablet Take 1 tablet (325 mg total) by mouth daily. Patient not taking: No sig reported 02/06/16   Elgergawy, Silver Huguenin, MD   Allergies  Allergen Reactions  . Tape Other (See Comments)    SKIN IS VERY THIN- WILL TEAR EASILY!! PLEASE USE PAPER TAPE  . Atorvastatin Rash and Other (See Comments)    Patient started on Plavix and Lipitor at the same time and developed a rash  . Brilinta [Ticagrelor] Hives  . Plavix [Clopidogrel Bisulfate] Rash and Other (See Comments)    Patient started on plavix and lipitor at the same time and developed a rash   Review of Systems  Constitutional: Positive for fatigue. Negative for activity change and appetite change.  Respiratory: Negative for shortness of breath.   Gastrointestinal: Negative for nausea and vomiting.  Neurological: Positive for weakness.  All other systems reviewed and are negative.   Physical Exam Vitals and nursing note reviewed.  Constitutional:      General: He is not in acute distress. Pulmonary:     Effort: No respiratory distress.  Skin:    General: Skin is warm and dry.  Neurological:     Mental Status: He is alert and oriented to  person, place, and time.     Motor: Weakness present.  Psychiatric:        Attention and Perception: Attention normal.        Behavior: Behavior is cooperative.        Cognition and Memory: Cognition and memory normal.     Vital Signs: BP (!) 89/60   Pulse 97   Temp 98.4 F (36.9 C) (Oral)   Resp 18   Ht 5' (1.524 m)   Wt 76.7 kg   SpO2 98%   BMI 33.02 kg/m  Pain Scale: 0-10   Pain Score: 0-No pain   SpO2: SpO2: 98 % O2 Device:SpO2: 98 % O2 Flow Rate: .O2 Flow Rate (L/min): 3 L/min  IO: Intake/output summary:   Intake/Output Summary (Last 24 hours) at 09/14/2020 1938 Last data filed at 09/14/2020 6294 Gross per 24 hour  Intake 293 ml  Output 1600 ml  Net -1307 ml    LBM: Last BM Date: 09/12/20 Baseline Weight: Weight: 76.6 kg Most recent weight: Weight: 76.7 kg     Palliative Assessment/Data: PPS 50%   Flowsheet Rows   Flowsheet Row Most Recent Value  Intake Tab   Referral Department Hospitalist  Unit at Time of Referral Med/Surg Unit  Palliative Care Primary Diagnosis Cancer  Date Notified 09/13/20  Palliative Care Type New Palliative care  Reason for referral Clarify Goals of Care  Date of Admission 09/14/20  Date first seen by Palliative Care 09/14/20  # of days Palliative referral response time 1 Day(s)  # of days IP prior to Palliative referral -1  Clinical Assessment   Psychosocial & Spiritual Assessment   Palliative Care Outcomes   Patient/Family meeting held? Yes  Who was at the meeting? patient  Palliative Care Outcomes Clarified goals of care,  Counseled regarding hospice, Provided psychosocial or spiritual support, Completed durable DNR, Linked to palliative care logitudinal support  Patient/Family wishes: Interventions discontinued/not started  Mechanical Ventilation, BiPAP, Tube feedings/TPN, Vasopressors, PEG      Time In: 1715 Time Out: 1627 Time Total: 72 minutes  Greater than 50%  of this time was spent counseling and coordinating  care related to the above assessment and plan.  Signed by: Lin Landsman, NP   Please contact Palliative Medicine Team phone at [phone] for questions and concerns.  For individual provider: See Shea Evans

## 2020-09-14 NOTE — Telephone Encounter (Signed)
[place] Night - Client Nonclinical Telephone Record AccessNurse Client [facility] Night - Client Client Site [place] Physician Loura Pardon - MD Contact Type Call Who Is Calling Patient / Member / Family / Caregiver Caller Name Sante Biedermann Caller Phone Number [phone] Patient Name David Bradshaw Patient DOB 06/26/1926 Call Type Message Only Information Provided Reason for Call Request to [place] Appointment Initial Comment Caller states she is needing to cancel her father in laws appointment d/t him being in the hospital. Additional Comment A message was sent. Disp. Time Disposition Final User 09/13/2020 6:11:41 PM General Information Provided Yes Shann Medal Call Closed By: Shann Medal Transaction Date/Time: 09/13/2020 6:08:43 PM (ET)

## 2020-09-14 NOTE — Progress Notes (Signed)
PROGRESS NOTE    David PEPPARD Sr.  EVO:[id number] DOB: 11/22/1926 DOA: 09/13/2020 PCP: Pleas Koch, NP    Brief Narrative:  87 year old gentleman with history of stroke, coronary artery disease, Karlene Lineman cirrhosis and recently diagnosed hepatocellular carcinoma on observation presented to the hospital with progressive shortness of breath, weakness and fall.  Patient also complaining of bloating up for last few months.  More than 10 pound weight gain in 1 month.  Difficulty walking around.  Lower extremity edema.  Lost balance and fell back when he was trying to get to the car.  Recent echocardiogram with ejection fraction 55%.   Assessment & Plan:   Principal Problem:   Cirrhosis of liver with ascites (HCC) Active Problems:   Acute congestive heart failure (HCC)   Acute kidney injury superimposed on CKD (HCC)   Foot ulcer, right, limited to breakdown of skin (HCC)   Class 1 obesity due to excess calories with body mass index (BMI) of 32.0 to 32.9 in adult   DNR (do not resuscitate)  Cirrhosis of liver with ascites/decompensated liver cirrhosis, new ascites.  Known hepatocellular carcinoma: Patient with difficult to control symptoms at home with conservative management and oral diuresis. Continue Aldactone.  Continue Lasix, changed to IV Lasix. Requested for diagnostic and therapeutic paracentesis. Called and discussed with gastroenterology for consultation. Palliative care consulted for advanced debility and medical issues.  Acute kidney injury on chronic kidney disease stage IIIb. Functionally worsening kidney disease.  Monitor output.  Monitor renal functions with IV diuresis.  He is not a candidate for hemodialysis.  Right leg wound: Seen by wound care.  Local wound care.  Debility/frailty: Work with PT OT.  Lives alone at home.  Will need more supervision living.  Palliative care consulted for further goal of care discussion.    DVT prophylaxis: SCDs Start: 09/13/20  1725   Code Status: DNR Family Communication: Called patient's son, unable to talk.  We will try again. Disposition Plan: Status is: Observation  The patient will require care spanning > 2 midnights and should be moved to inpatient because: IV treatments appropriate due to intensity of illness or inability to take PO and Inpatient level of care appropriate due to severity of illness  Dispo: The patient is from: Home              Anticipated d/c is to: Home              Patient currently is not medically stable to d/c.   Difficult to place patient No         Consultants:   Gastroenterology  Interventional radiology  Procedures:   None.  Is scheduled for paracentesis.  Antimicrobials:   None.   Subjective: Patient seen and examined in the morning rounds.  No family at the bedside.  He thinks that his breathing might have been slightly better.  He feels his belly is bloated.  He has a lot of fluid in his legs.  No other overnight events.  Objective: Vitals:   09/14/20 0448 09/14/20 0500 09/14/20 0724 09/14/20 0742  BP: 99/67  (!) 83/61 104/70  Pulse: 92  92   Resp: 19  17   Temp: 98.3 F (36.8 C)  98.3 F (36.8 C)   TempSrc: Oral  Oral   SpO2: 98%  98%   Weight:  76.7 kg    Height:        Intake/Output Summary (Last 24 hours) at 09/14/2020 1112 Last data filed at  09/14/2020 0802 Gross per 24 hour  Intake 243 ml  Output 1175 ml  Net -932 ml   Filed Weights   09/13/20 1242 09/14/20 0500  Weight: 76.6 kg 76.7 kg    Examination:  General exam: Appears chronically sick looking.  Currently comfortable.  On room air.  Frail and debilitated. Respiratory system: Mostly bilateral clear. Cardiovascular system: S1 & S2 heard, RRR.  3+ bilateral pitting edema. Gastrointestinal system: Soft.  Nontender.  Distended.  Bowel sounds present.  No palpable fluid thrill. Central nervous system: Alert and oriented. No focal neurological deficits.  Generalized  weakness. Extremities:  Right lateral foot ulceration with no apparent infection, pictures in the media section.    Data Reviewed: I have personally reviewed following labs and imaging studies  CBC: Recent Labs  Lab 09/13/20 1323 09/13/20 1340 09/14/20 0426  WBC 10.8*  --  9.1  NEUTROABS 7.4  --   --   HGB 17.2* 17.3* 15.0  HCT 52.5* 51.0 44.9  MCV 99.1  --  95.7  PLT 191  --  496   Basic Metabolic Panel: Recent Labs  Lab 09/13/20 1323 09/13/20 1340 09/14/20 0426  NA 141 142 140  K 4.6 4.6 4.0  CL 105 106 105  CO2 27  --  28  GLUCOSE 124* 122* 90  BUN 21 26* 22  CREATININE 1.97* 1.70* 1.84*  CALCIUM 8.3*  --  8.1*   GFR: Estimated Creatinine Clearance: 22.5 mL/min (A) (by C-G formula based on SCr of 1.84 mg/dL (H)). Liver Function Tests: Recent Labs  Lab 09/13/20 1323 09/14/20 0426  AST 49* 32  ALT 30 25  ALKPHOS 395* 306*  BILITOT 1.6* 1.6*  PROT 6.2* 5.3*  ALBUMIN 2.5* 2.1*   No results for input(s): LIPASE, AMYLASE in the last 168 hours. No results for input(s): AMMONIA in the last 168 hours. Coagulation Profile: Recent Labs  Lab 09/13/20 1323 09/14/20 0426  INR 1.0 1.1   Cardiac Enzymes: No results for input(s): CKTOTAL, CKMB, CKMBINDEX, TROPONINI in the last 168 hours. BNP (last 3 results) Recent Labs    07/23/20 1023 08/02/20 1202  PROBNP 427.0* 354.0*   HbA1C: No results for input(s): HGBA1C in the last 72 hours. CBG: No results for input(s): GLUCAP in the last 168 hours. Lipid Profile: No results for input(s): CHOL, HDL, LDLCALC, TRIG, CHOLHDL, LDLDIRECT in the last 72 hours. Thyroid Function Tests: No results for input(s): TSH, T4TOTAL, FREET4, T3FREE, THYROIDAB in the last 72 hours. Anemia Panel: No results for input(s): VITAMINB12, FOLATE, FERRITIN, TIBC, IRON, RETICCTPCT in the last 72 hours. Sepsis Labs: No results for input(s): PROCALCITON, LATICACIDVEN in the last 168 hours.  Recent Results (from the past 240 hour(s))   Resp Panel by RT-PCR (Flu A&B, Covid) Nasopharyngeal Swab     Status: None   Collection Time: 09/13/20  1:33 PM   Specimen: Nasopharyngeal Swab; Nasopharyngeal(NP) swabs in vial transport medium  Result Value Ref Range Status   SARS Coronavirus 2 by RT PCR NEGATIVE NEGATIVE Final    Comment: (NOTE) SARS-CoV-2 target nucleic acids are NOT DETECTED.  The SARS-CoV-2 RNA is generally detectable in upper respiratory specimens during the acute phase of infection. The lowest concentration of SARS-CoV-2 viral copies this assay can detect is 138 copies/mL. A negative result does not preclude SARS-Cov-2 infection and should not be used as the sole basis for treatment or other patient management decisions. A negative result may occur with  improper specimen collection/handling, submission of specimen other than nasopharyngeal  swab, presence of viral mutation(s) within the areas targeted by this assay, and inadequate number of viral copies(<138 copies/mL). A negative result must be combined with clinical observations, patient history, and epidemiological information. The expected result is Negative.  Fact Sheet for Patients:  EntrepreneurPulse.com.au  Fact Sheet for Healthcare Providers:  IncredibleEmployment.be  This test is no t yet approved or cleared by the [place] FDA and  has been authorized for detection and/or diagnosis of SARS-CoV-2 by FDA under an Emergency Use Authorization (EUA). This EUA will remain  in effect (meaning this test can be used) for the duration of the COVID-19 declaration under Section 564(b)(1) of the Act, 21 U.S.C.section 360bbb-3(b)(1), unless the authorization is terminated  or revoked sooner.       Influenza A by PCR NEGATIVE NEGATIVE Final   Influenza B by PCR NEGATIVE NEGATIVE Final    Comment: (NOTE) The Xpert Xpress SARS-CoV-2/FLU/RSV plus assay is intended as an aid in the diagnosis of influenza from  Nasopharyngeal swab specimens and should not be used as a sole basis for treatment. Nasal washings and aspirates are unacceptable for Xpert Xpress SARS-CoV-2/FLU/RSV testing.  Fact Sheet for Patients: EntrepreneurPulse.com.au  Fact Sheet for Healthcare Providers: IncredibleEmployment.be  This test is not yet approved or cleared by the [place] FDA and has been authorized for detection and/or diagnosis of SARS-CoV-2 by FDA under an Emergency Use Authorization (EUA). This EUA will remain in effect (meaning this test can be used) for the duration of the COVID-19 declaration under Section 564(b)(1) of the Act, 21 U.S.C. section 360bbb-3(b)(1), unless the authorization is terminated or revoked.  Performed at [facility], [place] [address], [place], [place] [address]          Radiology Studies: CT Head Wo Contrast  Result Date: 09/13/2020 CLINICAL DATA:  Pain following fall EXAM: CT HEAD WITHOUT CONTRAST TECHNIQUE: Contiguous axial images were obtained from the base of the skull through the vertex without intravenous contrast. COMPARISON:  January 30, 2016 head CT and brain MRI. FINDINGS: Brain: Moderate diffuse atrophy is stable. There is no intracranial mass, hemorrhage, extra-axial fluid collection, or midline shift. There is evidence of a prior small infarct in the posterior mid left cerebellum, stable. There is evidence of a prior infarct in the right mid parietal lobe posteriorly, stable. There is decreased attenuation in periventricular white matter consistent with periventricular small vessel disease. No acute infarct is evident. Vascular: No hyperdense vessel. Calcification noted in each carotid siphon region. Skull: Bony calvarium appears intact. Stable 7 mm left frontal dural calcification, a likely small enostosis. No surrounding edema. This is a finding of no clinical significance. Sinuses/Orbits: There is slight mucosal thickening in  several ethmoid air cells. Other visualized paranasal sinuses are clear. Orbits appear symmetric bilaterally. Other: Mastoid air cells are clear. IMPRESSION: Atrophy with periventricular small vessel disease. Prior infarct in the mid to posterior right parietal lobe and in the posterior mid left cerebellum. No acute infarct appreciable. No mass or hemorrhage. There are foci of arterial vascular calcification. There is mucosal thickening in several ethmoid air cells. Electronically Signed   By: Lowella Grip III M.D.   On: 09/13/2020 15:11   DG Chest Port 1 View  Result Date: 09/13/2020 CLINICAL DATA:  Shortness of breath. EXAM: PORTABLE CHEST 1 VIEW COMPARISON:  Chest x-ray dated June 06, 2020. FINDINGS: The heart size and mediastinal contours are within normal limits. Normal pulmonary vascularity. Emphysematous changes again noted. Increasing small left pleural effusion with left  basilar opacity. Unchanged scarring at the right lung base. No pneumothorax. No acute osseous abnormality. IMPRESSION: 1. Increasing small left pleural effusion with left basilar atelectasis versus infiltrate. Electronically Signed   By: Titus Dubin M.D.   On: 09/13/2020 13:56        Scheduled Meds: . docusate sodium  100 mg Oral BID  . furosemide  40 mg Intravenous BID  . pantoprazole  40 mg Oral BID  . sodium chloride flush  3 mL Intravenous Q12H  . spironolactone  50 mg Oral Daily   Continuous Infusions:   LOS: 0 days    Time spent: 35 minutes    Barb Merino, MD [facility] Pager [phone]

## 2020-09-14 NOTE — Telephone Encounter (Signed)
appt already cancelled.

## 2020-09-14 NOTE — Consult Note (Addendum)
[facility] Consult: 10:01 AM 09/14/2020  LOS: 0 days    Referring Provider: Dr Raelyn Mora  Primary Care Physician:  Pleas Koch, NP Primary Gastroenterologist:  Dr. Havery Moros.      Reason for Consultation: "Decompensated" cirrhosis.   HPI: David GROMAN Sr. is a 87 y.o. male.  CKD stage 3a.  MI.  Carotid stenosis.  S/p carotid endarterectomy.  Choledocholithiasis with jaundice 06/2020. 07/05/2020 MRCP/MRI abdomen: Hepatic cirrhosis.  1.6 cm subcapsular mass at lateral right lobe of liver susp for HCC.  CBD 7 mm with stone at distal common bile duct.  Cholelithiasis.  Mild ascites. 5.4 cm infrarenal AAA. AFP 48 07/17/2020 ERCP w sphincterotomy, stent stone extraction.  LFTs improved.  No plans for cholecystectomy due to advanced age, comorbidities.  Also found during the study were distal, grade 1 esophageal varices.  Severe esophagitis vs underlying malignancy.  J-shaped deformity of stomach.  Gastritis.  Nonbleeding, clean-based gastric ulcers.  Duodenitis with clean-based, nonbleeding duodenal ulcers. Pathology showed reactive changes in the duodenum, no villous architectural changes.  Mild chronic gastritis, no H. pylori.  Distal esophagus with squamous and cardiac mucosa with chronic nonspecific cardioesophagitis, no metaplasia, no dysplasia.  Regarding the liver lesion/HCC Dr. Kathlene Cote of IR referred patient to radiation oncologist Dr. Lisbeth Renshaw for discussion of possible radiation if the lesion grows in size.  Weight gain of 12 to 14 pounds in the last few months.  Lower extremity edema and abdominal swelling in the last few weeks.  More fatigued in the last few weeks.  No problems with excessive bleeding or bruising.  Some increased shortness of breath but able to ambulate within his house.  No longer  walks 600 feet to his mailbox.  He has 2 sons who live within 15 and 30 minutes of him and they are in frequent contact.  Mostly eats store-bought prepared meals which he eats in the microwave.  He has been a widower for 22 years.  Does not drink alcohol.  No itching.  Does not use NSAIDs.  No history of alcohol use or abuse.  Hepatitis serologies, lab work-up for autoimmune hepatitis, PBC and celiac disease were all negative.  Patient was being assisted in walking to the car when he slipped, fell and hit the back of his head.  EMS transported him to the hospital yesterday afternoon.  For couple of weeks he has had dyspnea.  EMS reported tachycardia with brief PVCs, and wide-complex tachycardia during transport.  Sustained visible wounds to his right lower extremity which have been bandaged 09/13/2020 head CT showing atrophy, small vessel disease, prior infarct, no acute changes. 09/13/2020 CXR small but increasing left pleural effusion and basilar atelectasis versus infiltrate. AKI.  GFR 31 compared with low to mid 40s within the past 2 to 3 weeks. Na normal at 140. BNP 207.  Troponin I 20. Hgb 15.  Platelets 175.  INR 1.1. T bili 1.6.  Alk phos 395 >> 306.  AST/ALT 49/30 >> 32/25 No ammonia level.    PT evaluation this morning notes mobility, gait, balance,  endurance deficits and suggest home health PT, supervision with mobility, possible SNF depending on family availability to assist the patient at home.       Past Medical History:  Diagnosis Date  . Aortic stenosis    mild AS 01/2016 echo  . Carotid stenosis   . Chicken pox   . Choledocholithiasis   . Cirrhosis ([place])   . Coronary arteriosclerosis due to lipid rich plaque 01/30/2016  . DNR (do not resuscitate) 09/13/2020  . Edema   . Esophagitis   . Gastric ulcer   . Hepatocellular carcinoma ([place]) 07/05/2020  . Myocardial infarct ([place])    1988 and 1990  . Stroke [facility])     Past Surgical History:  Procedure Laterality Date  . BIOPSY   07/17/2020   Procedure: BIOPSY;  Surgeon: Rush Landmark Telford Nab., MD;  Location: [facility];  Service: Gastroenterology;;  . CARDIAC CATHETERIZATION N/A 02/04/2016   Procedure: Left Heart Cath and Coronary Angiography;  Surgeon: Jolaine Artist, MD;  Location: [facility] LAB;  Service: Cardiovascular;  Laterality: N/A;  . CAROTID ENDARTERECTOMY Right 02/14/2016  . CATARACT EXTRACTION    . ENDARTERECTOMY Right 02/14/2016   Procedure: RIGHT CAROTID ENDARTERECTOMY;  Surgeon: [name], MD;  Location: [place];  Service: Vascular;  Laterality: Right;  . ERCP N/A 07/17/2020   Procedure: ENDOSCOPIC RETROGRADE CHOLANGIOPANCREATOGRAPHY (ERCP);  Surgeon: Irving Copas., MD;  Location: [facility];  Service: Gastroenterology;  Laterality: N/A;  . IR RADIOLOGIST EVAL & MGMT  07/27/2020  . PATCH ANGIOPLASTY Right 02/14/2016   Procedure: [place] X2;  Surgeon: [name], MD;  Location: [place];  Service: Vascular;  Laterality: Right;  . REMOVAL OF STONES  07/17/2020   Procedure: REMOVAL OF STONES;  Surgeon: Irving Copas., MD;  Location: [facility];  Service: Gastroenterology;;  . Joan Mayans  07/17/2020   Procedure: Joan Mayans;  Surgeon: Irving Copas., MD;  Location: [facility];  Service: Gastroenterology;;  . testicle     right hydrocele    Prior to Admission medications   Medication Sig Start Date End Date Taking? Authorizing Provider  furosemide (LASIX) 20 MG tablet Take 1 tablet (20 mg total) by mouth daily. For leg swelling. Patient taking differently: Take 20 mg by mouth in the morning. 08/21/20  Yes Armbruster, Carlota Raspberry, MD  Multiple Vitamins-Minerals (ICAPS AREDS FORMULA PO) Take 1 capsule by mouth 2 (two) times daily.   Yes [provider]  omeprazole (PRILOSEC) 40 MG capsule Take 1 capsule (40 mg total) by mouth 2 (two) times daily before a meal. 07/17/20 07/17/21 Yes Mansouraty,  Telford Nab., MD  spironolactone (ALDACTONE) 50 MG tablet Take 1 tablet (50 mg total) by mouth daily. Patient taking differently: Take 50 mg by mouth in the morning. 08/21/20  Yes Armbruster, Carlota Raspberry, MD  aspirin EC 325 MG tablet Take 1 tablet (325 mg total) by mouth daily. Patient not taking: No sig reported 02/06/16   Elgergawy, Silver Huguenin, MD    Scheduled Meds: . docusate sodium  100 mg Oral BID  . furosemide  40 mg Intravenous BID  . pantoprazole  40 mg Oral BID  . sodium chloride flush  3 mL Intravenous Q12H  . spironolactone  50 mg Oral Daily   Infusions:  PRN Meds: acetaminophen **OR** acetaminophen, bisacodyl, ondansetron **OR** ondansetron (ZOFRAN) IV, oxyCODONE, polyethylene glycol   Allergies as of 09/13/2020 - Review Complete 09/13/2020  Allergen Reaction Noted  . Tape Other (See Comments) 09/13/2020  .  Atorvastatin Rash and Other (See Comments) 02/06/2016  . Brilinta [ticagrelor] Hives 02/11/2016  . Plavix [clopidogrel bisulfate] Rash and Other (See Comments) 02/06/2016    Family History  Problem Relation Age of Onset  . Heart attack Mother   . Heart disease Mother   . Bone cancer Father   . Cancer Father     Social History   Socioeconomic History  . Marital status: Widowed    Spouse name: Not on file  . Number of children: 4  . Years of education: 60  . Highest education level: Not on file  Occupational History  . Occupation: Retired - Optician, dispensing  Tobacco Use  . Smoking status: Former Smoker    Packs/day: 1.00    Years: 35.00    Pack years: 35.00    Quit date: 07/13/1985    Years since quitting: 35.1  . Smokeless tobacco: Never Used  Vaping Use  . Vaping Use: Never used  Substance and Sexual Activity  . Alcohol use: No  . Drug use: No  . Sexual activity: Not on file  Other Topics Concern  . Not on file  Social History Narrative   Fun: Watching TV.    Denies abuse and feels safe at home.    Social Determinants of Health   Financial  Resource Strain: Not on file  Food Insecurity: Not on file  Transportation Needs: Not on file  Physical Activity: Not on file  Stress: Not on file  Social Connections: Not on file  Intimate Partner Violence: Not on file    REVIEW OF SYSTEMS: Constitutional: See HPI ENT:  No nose bleeds Pulm: See HPI CV:  No palpitations, + LE edema.  GU:  No hematuria, no frequency GI: See HPI Heme: See HPI Transfusions: 2 RBCs in 02/2016. Neuro:  No headaches, no peripheral tingling or numbness.  No syncope, no seizures. Derm:  No itching, no rash or sores.  Endocrine:  No sweats or chills.  No polyuria or dysuria Immunization: Has not been vaccinated for COVID-19 because he never leaves his house he does not feel like he needs immunization Travel:  None beyond local counties in last few months.    PHYSICAL EXAM: Vital signs in last 24 hours: Vitals:   09/14/20 0724 09/14/20 0742  BP: (!) 83/61 104/70  Pulse: 92   Resp: 17   Temp: 98.3 F (36.8 C)   SpO2: 98%    Wt Readings from Last 3 Encounters:  09/14/20 76.7 kg  08/14/20 74.4 kg  08/07/20 73.9 kg    General: Pleasant, elderly, alert, comfortable.  Does not look acutely ill Head: No facial asymmetry or swelling.  No signs of head trauma. Eyes: No conjunctival pallor.  No scleral icterus Ears: Mild HOH Nose: No congestion or discharge Mouth: Edentulous.  Mucosa is moist, pink, clear.  Tongue midline. Neck: No JVD, no masses, no thyromegaly Lungs: Diminished, some crackles in the bases. Heart: RRR. Abdomen: Soft, not tender, not distended.  Active bowel sounds.  No HSM, masses, bruits, hernias.   Rectal: Deferred Musc/Skeltl: No joint redness, swelling or significant deformities. Extremities: Lower extremity edema Neurologic: Oriented x3.  Alert.  Moves all 4 limbs.  No asterixis. Skin: Palmar erythema. Nodes: No cervical adenopathy Psych: Calm, cooperative, appropriate.  Intake/Output from previous day: 03/31 0701 -  04/01 0700 In: 240 [P.O.:240] Out: 1175 [Urine:1175] Intake/Output this shift: Total I/O In: 3 [I.V.:3] Out: -   LAB RESULTS: Recent Labs    09/13/20 1323 09/13/20 1340 09/14/20  0426  WBC 10.8*  --  9.1  HGB 17.2* 17.3* 15.0  HCT 52.5* 51.0 44.9  PLT 191  --  175   BMET Lab Results  Component Value Date   NA 140 09/14/2020   NA 142 09/13/2020   NA 141 09/13/2020   K 4.0 09/14/2020   K 4.6 09/13/2020   K 4.6 09/13/2020   CL 105 09/14/2020   CL 106 09/13/2020   CL 105 09/13/2020   CO2 28 09/14/2020   CO2 27 09/13/2020   CO2 27 08/30/2020   GLUCOSE 90 09/14/2020   GLUCOSE 122 (H) 09/13/2020   GLUCOSE 124 (H) 09/13/2020   BUN 22 09/14/2020   BUN 26 (H) 09/13/2020   BUN 21 09/13/2020   CREATININE 1.84 (H) 09/14/2020   CREATININE 1.70 (H) 09/13/2020   CREATININE 1.97 (H) 09/13/2020   CALCIUM 8.1 (L) 09/14/2020   CALCIUM 8.3 (L) 09/13/2020   CALCIUM 8.6 08/30/2020   LFT Recent Labs    09/13/20 1323 09/14/20 0426  PROT 6.2* 5.3*  ALBUMIN 2.5* 2.1*  AST 49* 32  ALT 30 25  ALKPHOS 395* 306*  BILITOT 1.6* 1.6*   PT/INR Lab Results  Component Value Date   INR 1.1 09/14/2020   INR 1.0 09/13/2020   INR 1.0 07/13/2020   Hepatitis Panel No results for input(s): HEPBSAG, HCVAB, HEPAIGM, HEPBIGM in the last 72 hours. C-Diff No components found for: CDIFF Lipase  No results found for: LIPASE  Drugs of Abuse  No results found for: LABOPIA, COCAINSCRNUR, LABBENZ, AMPHETMU, THCU, LABBARB   RADIOLOGY STUDIES: CT Head Wo Contrast  Result Date: 09/13/2020 CLINICAL DATA:  Pain following fall EXAM: CT HEAD WITHOUT CONTRAST TECHNIQUE: Contiguous axial images were obtained from the base of the skull through the vertex without intravenous contrast. COMPARISON:  January 30, 2016 head CT and brain MRI. FINDINGS: Brain: Moderate diffuse atrophy is stable. There is no intracranial mass, hemorrhage, extra-axial fluid collection, or midline shift. There is evidence of a  prior small infarct in the posterior mid left cerebellum, stable. There is evidence of a prior infarct in the right mid parietal lobe posteriorly, stable. There is decreased attenuation in periventricular white matter consistent with periventricular small vessel disease. No acute infarct is evident. Vascular: No hyperdense vessel. Calcification noted in each carotid siphon region. Skull: Bony calvarium appears intact. Stable 7 mm left frontal dural calcification, a likely small enostosis. No surrounding edema. This is a finding of no clinical significance. Sinuses/Orbits: There is slight mucosal thickening in several ethmoid air cells. Other visualized paranasal sinuses are clear. Orbits appear symmetric bilaterally. Other: Mastoid air cells are clear. IMPRESSION: Atrophy with periventricular small vessel disease. Prior infarct in the mid to posterior right parietal lobe and in the posterior mid left cerebellum. No acute infarct appreciable. No mass or hemorrhage. There are foci of arterial vascular calcification. There is mucosal thickening in several ethmoid air cells. Electronically Signed   By: Lowella Grip III M.D.   On: 09/13/2020 15:11   DG Chest Port 1 View  Result Date: 09/13/2020 CLINICAL DATA:  Shortness of breath. EXAM: PORTABLE CHEST 1 VIEW COMPARISON:  Chest x-ray dated June 06, 2020. FINDINGS: The heart size and mediastinal contours are within normal limits. Normal pulmonary vascularity. Emphysematous changes again noted. Increasing small left pleural effusion with left basilar opacity. Unchanged scarring at the right lung base. No pneumothorax. No acute osseous abnormality. IMPRESSION: 1. Increasing small left pleural effusion with left basilar atelectasis versus infiltrate. Electronically Signed  By: Titus Dubin M.D.   On: 09/13/2020 13:56      IMPRESSION:   *   NASH cirrhosis Meld-Na and MELD are both 15.   Other than possible ascites, he does not seem that  decompensated. Sodium is normal.  No anemia, no thrombocytopenia, no coagulopathy.  *   Likely HCC per MR in 06/2020.  Treatment w observation for now given age and comorbidities.    *   AKI on top of CKD stage 3a-3 b  *    Debilitation, gait/balance deficits resulting in fall.  No serious trauma.    *    Small L pleural effusion and possible lung infiltrate.  BNP 207, was 427 then 354 in Feb 2022.   08/27/2020 2D echo with LVEF 50 to 55%.  Normal right ventricular systolic function.  No significant valvular disease or regurgitation.    PLAN:     *   Ordered Limited ultrasound to assess for ascites and perhaps get imaging of the [place]  09/14/2020, 10:01 AM Phone [phone]   Attending physician's note   I have taken an interval history, reviewed the chart and examined the patient. I agree with the Advanced Practitioner's note, impression and recommendations.   NASH cirrhosis. Low MELD-Na at 15.  1.6 cm R lobe liver HCC- observation d/t advanced age. AFP 48 06/2020 AKI on CKD3 Choledocholithiasis s/p biliary sphincterotomy with stone extraction 07/17/2020.  EGD at that time revealed small grade 1 esophageal varices. AAA 5.4 cm   Plan: -[place] abdo to look for ascites.If +, tap -Check AFP -He is scheduled to have MRCP done as an outpt -No new GI recommendations. -FU with Dr. Havery Moros thereafter.   Carmell Austria, MD [facility] [phone]

## 2020-09-14 NOTE — Plan of Care (Signed)

## 2020-09-15 ENCOUNTER — Other Ambulatory Visit: Payer: Self-pay | Admitting: Gastroenterology

## 2020-09-15 DIAGNOSIS — K7460 Unspecified cirrhosis of liver: Secondary | ICD-10-CM | POA: Diagnosis not present

## 2020-09-15 DIAGNOSIS — R600 Localized edema: Secondary | ICD-10-CM

## 2020-09-15 DIAGNOSIS — R188 Other ascites: Secondary | ICD-10-CM | POA: Diagnosis not present

## 2020-09-15 DIAGNOSIS — N189 Chronic kidney disease, unspecified: Secondary | ICD-10-CM | POA: Diagnosis not present

## 2020-09-15 DIAGNOSIS — Z515 Encounter for palliative care: Secondary | ICD-10-CM

## 2020-09-15 DIAGNOSIS — E440 Moderate protein-calorie malnutrition: Secondary | ICD-10-CM | POA: Insufficient documentation

## 2020-09-15 DIAGNOSIS — N179 Acute kidney failure, unspecified: Secondary | ICD-10-CM | POA: Diagnosis not present

## 2020-09-15 NOTE — NC FL2 (Addendum)
[place] LEVEL OF CARE SCREENING TOOL     IDENTIFICATION  Patient Name: David KIENLE Sr. Birthdate: September 22, 1926 Sex: male Admission Date (Current Location): 09/13/2020  [facility] and [place] Number:  Herbalist and Address:  [facility], [place] [address] [place], [place] [address]      Provider Number: [id number]  Attending Physician Name and Address:  Barb Merino, MD  Relative Name and Phone Number:  Lukas Pelcher [phone]    Current Level of Care: Hospital Recommended Level of Care: [place] Prior Approval Number:    Date Approved/Denied:   PASRR Number:   [id number]  Discharge Plan: SNF    Current Diagnoses: Patient Active Problem List   Diagnosis Date Noted  . Acute congestive heart failure ([place]) 09/13/2020  . Acute kidney injury superimposed on CKD ([place]) 09/13/2020  . Foot ulcer, right, limited to breakdown of skin ([place]) 09/13/2020  . Class 1 obesity due to excess calories with body mass index (BMI) of 32.0 to 32.9 in adult 09/13/2020  . DNR (do not resuscitate) 09/13/2020  . Cerumen impaction 08/02/2020  . Exertional dyspnea 08/02/2020  . Multiple falls 08/02/2020  . Cirrhosis of liver with ascites ([place]) 08/02/2020  . Jaundice 06/01/2020  . Fatigue 06/01/2020  . Bilateral lower extremity edema 02/11/2016  . Chronic kidney disease, stage 3 ([place]) 02/11/2016  . Stroke ([place])   . Rash and nonspecific skin eruption   . HLD (hyperlipidemia)   . Carotid stenosis   . History of CVA (cerebrovascular accident) 01/31/2016  . TIA (transient ischemic attack) 01/30/2016    Orientation RESPIRATION BLADDER Height & Weight     Self,Time,Situation,Place  Normal External catheter,Continent Weight: 153 lb 7 oz (69.6 kg) Height:  5' (152.4 cm)  BEHAVIORAL SYMPTOMS/MOOD NEUROLOGICAL BOWEL NUTRITION STATUS      Continent Diet (See DC Summary)  AMBULATORY STATUS COMMUNICATION OF NEEDS Skin   Limited  Assist Verbally Skin abrasions (Right Foot abrasions)                       Personal Care Assistance Level of Assistance  Bathing,Feeding,Dressing Bathing Assistance: Limited assistance Feeding assistance: Independent Dressing Assistance: Limited assistance     Functional Limitations Info  Sight,Hearing,Speech Sight Info: Adequate Hearing Info: Adequate Speech Info: Adequate    SPECIAL CARE FACTORS FREQUENCY  PT (By licensed PT),OT (By licensed OT)     PT Frequency: 5x week OT Frequency: 5x week            Contractures Contractures Info: Not present    Additional Factors Info  Code Status,Allergies Code Status Info: DNR Allergies Info: Tape   Atorvastatin   Brilinta (Ticagrelor)   Plavix (Clopidogrel Bisulfate)           Current Medications (09/15/2020):  This is the current hospital active medication list Current Facility-Administered Medications  Medication Dose Route Frequency Provider Last Rate Last Admin  . acetaminophen (TYLENOL) tablet 650 mg  650 mg Oral Q6H PRN Karmen Bongo, MD       Or  . acetaminophen (TYLENOL) suppository 650 mg  650 mg Rectal Q6H PRN Karmen Bongo, MD      . bisacodyl (DULCOLAX) EC tablet 5 mg  5 mg Oral Daily PRN Karmen Bongo, MD      . docusate sodium (COLACE) capsule 100 mg  100 mg Oral BID Karmen Bongo, MD   100 mg at 09/15/20 0827  . feeding supplement (ENSURE ENLIVE /  ENSURE PLUS) liquid 237 mL  237 mL Oral BID BM Ghimire, Dante Gang, MD      . furosemide (LASIX) injection 40 mg  40 mg Intravenous BID Karmen Bongo, MD   40 mg at 09/15/20 0818  . lidocaine (XYLOCAINE) 1 % (with pres) injection   Infiltration PRN Docia Barrier, PA   10 mL at 09/14/20 1641  . multivitamin with minerals tablet 1 tablet  1 tablet Oral Daily Barb Merino, MD   1 tablet at 09/15/20 0826  . ondansetron (ZOFRAN) tablet 4 mg  4 mg Oral Q6H PRN Karmen Bongo, MD       Or  . ondansetron [facility]) injection 4 mg  4 mg Intravenous  Q6H PRN Karmen Bongo, MD      . oxyCODONE (Oxy IR/ROXICODONE) immediate release tablet 5 mg  5 mg Oral Q4H PRN Karmen Bongo, MD      . pantoprazole (PROTONIX) EC tablet 40 mg  40 mg Oral BID Karmen Bongo, MD   40 mg at 09/15/20 8841  . polyethylene glycol (MIRALAX / GLYCOLAX) packet 17 g  17 g Oral Daily PRN Karmen Bongo, MD      . sodium chloride flush (NS) 0.9 % injection 3 mL  3 mL Intravenous Q12H Karmen Bongo, MD   3 mL at 09/15/20 0831  . spironolactone (ALDACTONE) tablet 50 mg  50 mg Oral Daily Karmen Bongo, MD   50 mg at 09/15/20 6606     Discharge Medications: Please see discharge summary for a list of discharge medications.  Relevant Imaging Results:  Relevant Lab Results:   Additional Information SS# [place], [place]

## 2020-09-15 NOTE — Progress Notes (Addendum)
Physical Therapy Treatment Patient Details Name: David RIES Sr. MRN: [id number] DOB: 02/21/1927 Today's Date: 09/15/2020    History of Present Illness 87 y.o. male [facility] ED on 09/13/2020 with SOB and fall. Pt admitted with ascites, volume overload, and R foot wounds. Past medical history significant of CVA; CAD; cirrhosis with hepatocellular CA; and carotid and aortic stenosis.    PT Comments    Patient limited with activity tolerance due to orthostatic hypotension.  He can participate EOB, but standing activity limited.  Patient preferred back to bed so placed bed in chair position to allow more upright tolerance.  He may need SNF if unable to titrate meds for managing fluid without causing hypotension in sitting.  PT to continue to follow.   Orthostatic VS for the past 24 hrs (Last 3 readings):  BP- Lying Pulse- Lying BP- Sitting Pulse- Sitting BP- Standing at 0 minutes Pulse- Standing at 0 minutes  09/15/20 1500 (P) 98/60 (P) 83 (!) (P) 83/63 (P) 97 (!) (P) 72/57 (P) 81     Follow Up Recommendations  Home health PT;Supervision for mobility/OOB     Equipment Recommendations  None recommended by PT    Recommendations for Other Services       Precautions / Restrictions Precautions Precautions: Fall Restrictions Other Position/Activity Restrictions: Male pure wick    Mobility  Bed Mobility Overal bed mobility: Needs Assistance Bed Mobility: Supine to Sit     Supine to sit: Mod assist     General bed mobility comments: lifting help for trunk as positioned diagonal in bed; to supine with S and cues    Transfers Overall transfer level: Needs assistance Equipment used: Rolling walker (2 wheeled) Transfers: Sit to/from Stand Sit to Stand: Min assist         General transfer comment: for balance  Ambulation/Gait Ambulation/Gait assistance: Min assist Gait Distance (Feet): 3 Feet Assistive device: Rolling walker (2 wheeled) Gait Pattern/deviations:  Step-to pattern     General Gait Details: side steps to [facility] only due to othostatic hypotension   Stairs             Wheelchair Mobility    Modified Rankin (Stroke Patients Only)       Balance Overall balance assessment: Needs assistance Sitting-balance support: Feet supported Sitting balance-Leahy Scale: Good Sitting balance - Comments: EOB to drink Ensure   Standing balance support: Bilateral upper extremity supported Standing balance-Leahy Scale: Poor Standing balance comment: UE support and minguard for balance                            Cognition Arousal/Alertness: Awake/alert Behavior During Therapy: WFL for tasks assessed/performed Overall Cognitive Status: Within Functional Limits for tasks assessed                                        Exercises      General Comments General comments (skin integrity, edema, etc.): on 2L [place] during session      Pertinent Vitals/Pain Pain Assessment: No/denies pain    Home Living                      Prior Function            PT Goals (current goals can now be found in the care plan section) Progress towards PT goals: Not progressing  toward goals - comment (due to orthostatic hypotension)    Frequency    Min 3X/week      PT Plan Current plan remains appropriate    Co-evaluation              AM-PAC PT "6 Clicks" Mobility   Outcome Measure  Help needed turning from your back to your side while in a flat bed without using bedrails?: A Little Help needed moving from lying on your back to sitting on the side of a flat bed without using bedrails?: A Little Help needed moving to and from a bed to a chair (including a wheelchair)?: A Little Help needed standing up from a chair using your arms (e.g., wheelchair or bedside chair)?: A Little Help needed to walk in hospital room?: A Little Help needed climbing 3-5 steps with a railing? : A Lot 6 Click Score: 17     End of Session Equipment Utilized During Treatment: Oxygen Activity Tolerance: Patient tolerated treatment well Patient left: in bed;with call bell/phone within reach (bed in chair position)   PT Visit Diagnosis: Unsteadiness on feet (R26.81);Muscle weakness (generalized) (M62.81)     Time: 1003-4961 PT Time Calculation (min) (ACUTE ONLY): 17 min  Charges:  $Therapeutic Activity: 8-22 mins                     Magda Kiel, PT Acute Rehabilitation Services TEIHD:[phone] Office:[phone] 09/15/2020    David Bradshaw 09/15/2020, 4:58 PM

## 2020-09-15 NOTE — Progress Notes (Signed)
SATURATION QUALIFICATIONS: (This note is used to comply with regulatory documentation for home oxygen)  Patient Saturations on Room Air at Rest =93%  Patient Saturations on Room Air while Ambulating = 82%  Patient Saturations on 2 Liters of oxygen while Ambulating = 95%  Please briefly explain why patient needs home oxygen: Patient unable to maintain saturations above 88% on Room Air

## 2020-09-15 NOTE — Progress Notes (Addendum)
Brief Palliative Medicine Progress Note:  PMT consult received and chart reviewed. Consult completed. Full PMT consult to follow.  Recommendations/Plan:  Continue current medical treatment  Continue DNR/DNI as previously documented - durable DNR form placed in shadow chart; copy made and will be scanned into Vynca/ACP tab  Patient's goal is to improve and return home; he understands he may need more supervision at home, is open to PT, but would need to discuss with sons about further options if indicated. Between sons and neighbors, patient is checked on daily  Patient is open to completing outpatient MRCP as previously scheduled  Patient would not want to pursue dialysis if ever offered; he understands he is a poor candidate  MOST form completed as follows: DNR, Limited Additional Interventions (Do not intubate), No antibiotics, No IVF, No feeding tube. Original placed on shadow chart; copy made and will be scanned into Vynca/ACP tab  [facility] consulted for: outpatient Palliative Care referral  Obtain copies of Living Will and HCPOA if able - requested copies - per patient son/Kevin is HCPOA  PMT will continue to follow peripherally. If there are any imminent needs please call the service directly   Thank you for allowing PMT to assist in the care of this patient.  Jennae Hakeem M. Tamala [facility] Palliative Medicine Team Team Phone: [phone] NO CHARGE

## 2020-09-15 NOTE — TOC Initial Note (Signed)
Transition of Care (TOC) - Initial/Assessment Note    Patient Details  Name: David LEGE Sr. MRN: [id number] Date of Birth: Dec 23, 1926  Transition of [facility]) CM/SW Contact:    Bary Castilla, LCSW Phone Number: [phone] 09/15/2020, 11:59 AM  Clinical Narrative:          CSW met with patient and patient's son David Bradshaw to discuss PT recommendation of a SNF. CSW explained recommendation and they were in agreement with going to a ST SNF. David Bradshaw explained that patient lives alone and does not have assistance at home. CSW discussed the SNF process.CSW provided patient with medicare.gov rating list.  Patient gave CSW permission to fax referrals out to local facilities.CSW answered questions about the SNF process and the next steps in the process.  CSW explained to patient and David Bradshaw that it is up to the insurance company to approve the authorization and it is a possibility that it may not be approved. CSW also explained that [facility] is contracted with limited SNFs. CSW encourage family to have a back up plan if authorization is not approved.  Patient has not been vaccinated.  TOC team will continue to assist with discharge planning needs.   Expected Discharge Plan: Skilled Nursing Facility Barriers to Discharge: Continued Medical Work up,Insurance Authorization,SNF Pending bed offer   Patient Goals and CMS Choice   CMS Medicare.gov Compare Post Acute Care list provided to:: Patient    Expected Discharge Plan and Services Expected Discharge Plan: [place]       Living arrangements for the past 2 months: Single Family Home                                      Prior Living Arrangements/Services Living arrangements for the past 2 months: Single Family Home Lives with:: Self Patient language and need for interpreter reviewed:: Yes          Care giver support system in place?: Yes (comment)      Activities of Daily Living Home Assistive  Devices/Equipment: Cane (specify quad or straight),Walker (specify type) ADL Screening (condition at time of admission) Patient's cognitive ability adequate to safely complete daily activities?: Yes Is the patient deaf or have difficulty hearing?: Yes Does the patient have difficulty seeing, even when wearing glasses/contacts?: Yes Does the patient have difficulty concentrating, remembering, or making decisions?: No Patient able to express need for assistance with ADLs?: Yes Does the patient have difficulty dressing or bathing?: No Independently performs ADLs?: No Communication: Independent,Appropriate for developmental age Dressing (OT): Independent Grooming: Independent Feeding: Independent Bathing: Independent with device (comment),Needs assistance Is this a change from baseline?: Pre-admission baseline Toileting: Needs assistance Is this a change from baseline?: Pre-admission baseline In/Out Bed: Needs assistance Is this a change from baseline?: Pre-admission baseline Walks in Home: Needs assistance Is this a change from baseline?: Pre-admission baseline Does the patient have difficulty walking or climbing stairs?: Yes Weakness of Legs: Both Weakness of Arms/Hands: None  Permission Sought/Granted Permission sought to share information with : Family Supports Permission granted to share information with : Yes, Verbal Permission Granted  Share Information with NAME: David Bradshaw  Permission granted to share info w AGENCY: SNFs  Permission granted to share info w Relationship: Son  Permission granted to share info w Contact Information: [phone]  Emotional Assessment Appearance:: Appears stated age Attitude/Demeanor/Rapport: Engaged Affect (typically observed): Appropriate,Accepting Orientation: : Oriented to  Self,Oriented to Place,Oriented to  Time,Oriented to Situation      Admission diagnosis:  Acute congestive heart failure ([place]) [I50.9] Acute pulmonary edema ([place])  [J81.0] Cirrhosis of liver with ascites ([place]) [K74.60, R18.8] Patient Active Problem List   Diagnosis Date Noted  . Acute congestive heart failure ([employer]) 09/13/2020  . Acute kidney injury superimposed on CKD ([place]) 09/13/2020  . Foot ulcer, right, limited to breakdown of skin ([place]) 09/13/2020  . Class 1 obesity due to excess calories with body mass index (BMI) of 32.0 to 32.9 in adult 09/13/2020  . DNR (do not resuscitate) 09/13/2020  . Cerumen impaction 08/02/2020  . Exertional dyspnea 08/02/2020  . Multiple falls 08/02/2020  . Cirrhosis of liver with ascites ([place]) 08/02/2020  . Jaundice 06/01/2020  . Fatigue 06/01/2020  . Bilateral lower extremity edema 02/11/2016  . Chronic kidney disease, stage 3 ([place]) 02/11/2016  . Stroke ([place])   . Rash and nonspecific skin eruption   . HLD (hyperlipidemia)   . Carotid stenosis   . History of CVA (cerebrovascular accident) 01/31/2016  . TIA (transient ischemic attack) 01/30/2016   PCP:  Pleas Koch, NP Pharmacy:   [id number] [id number]: [phone]: [phone]    Social Determinants of Health (SDOH) Interventions    Readmission Risk Interventions No flowsheet data found.

## 2020-09-15 NOTE — Progress Notes (Signed)
Progress Note    ASSESSMENT AND PLAN:   NASH cirrhosis with ascites s/p LVP (3/4 lit yesterday). Low MELD-Na at 15.  1.6 cm R lobe liver HCC- observation d/t advanced age. AFP 48 06/2020 AKI on CKD3 Choledocholithiasis s/p biliary sphincterotomy with stone extraction 07/17/2020.  EGD at that time revealed small grade 1 esophageal varices. AAA 5.4 cm DNR  Plan: -Resume diuretics as already done. (Lasix 40 twice daily, spironolactone 50 QD) -Trend CBC, CMP -Follow AFP (drawn 09/15/2020) -Unfortunately ascitic fluid labs were not ordered.  If any future paracenteses is required, check fluid for cytology, cell count, albumin -Low-salt diet -FU with Dr Havery Moros as an outpt -D/W pt's son in detail. -We will sign off for now.     SUBJECTIVE   Doing very well. No GI complaints Feels better after LVP He was having lunch when I saw him. No abdominal pain. Son at bedside.    OBJECTIVE:     Vital signs in last 24 hours: Temp:  [98 F (36.7 C)-98.6 F (37 C)] 98 F (36.7 C) (04/02 0818) Pulse Rate:  [84-94] 94 (04/02 0818) Resp:  [18] 18 (04/02 0818) BP: (88-113)/(60-72) 113/72 (04/02 0818) SpO2:  [91 %-98 %] 91 % (04/02 0818) Weight:  [69.6 kg] 69.6 kg (04/02 0430) Last BM Date: 09/12/20 General:   Alert, male in NAD Abdomen:  Soft, nondistended, nontender.  Normal bowel sounds,.         Intake/Output from previous day: 04/01 0701 - 04/02 0700 In: 53 [I.V.:3; IV Piggyback:50] Out: 550 [Urine:550] Intake/Output this shift: Total I/O In: 243 [P.O.:240; I.V.:3] Out: 700 [Urine:700]  Lab Results: Recent Labs    09/13/20 1323 09/13/20 1340 09/14/20 0426  WBC 10.8*  --  9.1  HGB 17.2* 17.3* 15.0  HCT 52.5* 51.0 44.9  PLT 191  --  175   BMET Recent Labs    09/13/20 1323 09/13/20 1340 09/14/20 0426  NA 141 142 140  K 4.6 4.6 4.0  CL 105 106 105  CO2 27  --  28  GLUCOSE 124* 122* 90  BUN 21 26* 22  CREATININE 1.97* 1.70* 1.84*  CALCIUM 8.3*  --   8.1*   LFT Recent Labs    09/14/20 0426  PROT 5.3*  ALBUMIN 2.1*  AST 32  ALT 25  ALKPHOS 306*  BILITOT 1.6*   PT/INR Recent Labs    09/13/20 1323 09/14/20 0426  LABPROT 13.0 14.1  INR 1.0 1.1   Hepatitis Panel No results for input(s): HEPBSAG, HCVAB, HEPAIGM, HEPBIGM in the last 72 hours.  CT Head Wo Contrast  Result Date: 09/13/2020 CLINICAL DATA:  Pain following fall EXAM: CT HEAD WITHOUT CONTRAST TECHNIQUE: Contiguous axial images were obtained from the base of the skull through the vertex without intravenous contrast. COMPARISON:  January 30, 2016 head CT and brain MRI. FINDINGS: Brain: Moderate diffuse atrophy is stable. There is no intracranial mass, hemorrhage, extra-axial fluid collection, or midline shift. There is evidence of a prior small infarct in the posterior mid left cerebellum, stable. There is evidence of a prior infarct in the right mid parietal lobe posteriorly, stable. There is decreased attenuation in periventricular white matter consistent with periventricular small vessel disease. No acute infarct is evident. Vascular: No hyperdense vessel. Calcification noted in each carotid siphon region. Skull: Bony calvarium appears intact. Stable 7 mm left frontal dural calcification, a likely small enostosis. No surrounding edema. This is a finding of no clinical significance. Sinuses/Orbits: There is slight  mucosal thickening in several ethmoid air cells. Other visualized paranasal sinuses are clear. Orbits appear symmetric bilaterally. Other: Mastoid air cells are clear. IMPRESSION: Atrophy with periventricular small vessel disease. Prior infarct in the mid to posterior right parietal lobe and in the posterior mid left cerebellum. No acute infarct appreciable. No mass or hemorrhage. There are foci of arterial vascular calcification. There is mucosal thickening in several ethmoid air cells. Electronically Signed   By: Lowella Grip III M.D.   On: 09/13/2020 15:11   US  Abdomen Limited  Result Date: 09/14/2020 CLINICAL DATA:  87 year old with abdominal ascites. History of hepatocellular carcinoma. EXAM: ULTRASOUND ABDOMEN LIMITED RIGHT UPPER QUADRANT COMPARISON:  MRI 07/05/2020 FINDINGS: Gallbladder: Not visualized. Common bile duct: Diameter: 4 mm Liver: Liver is severely nodular and compatible with cirrhosis. Liver parenchyma is mildly heterogeneous. Large amount of perihepatic ascites. Patient has a known right hepatic lesion that is not clearly identified on this examination. Portal vein is patent on color Doppler imaging with normal direction of blood flow towards the liver. Other: Minimal fluid in the right lower quadrant. Large amount of ascites in the left upper quadrant. Large amount of ascites in left lower quadrant. IMPRESSION: 1. Cirrhotic liver with a large amount of ascites. 2. Gallbladder is not visualized. 3. Known hepatic lesion is not identified on this examination. Electronically Signed   By: Markus Daft M.D.   On: 09/14/2020 16:54   DG Chest Port 1 View  Result Date: 09/13/2020 CLINICAL DATA:  Shortness of breath. EXAM: PORTABLE CHEST 1 VIEW COMPARISON:  Chest x-ray dated June 06, 2020. FINDINGS: The heart size and mediastinal contours are within normal limits. Normal pulmonary vascularity. Emphysematous changes again noted. Increasing small left pleural effusion with left basilar opacity. Unchanged scarring at the right lung base. No pneumothorax. No acute osseous abnormality. IMPRESSION: 1. Increasing small left pleural effusion with left basilar atelectasis versus infiltrate. Electronically Signed   By: Titus Dubin M.D.   On: 09/13/2020 13:56   IR Paracentesis  Result Date: 09/14/2020 INDICATION: Patient with history of cirrhosis found to have ascites. Request is made for therapeutic paracentesis. EXAM: ULTRASOUND GUIDED THERAPEUTIC PARACENTESIS MEDICATIONS: 10 mL 1% lidocaine COMPLICATIONS: None immediate. PROCEDURE: Informed written consent  was obtained from the patient after a discussion of the risks, benefits and alternatives to treatment. A timeout was performed prior to the initiation of the procedure. Initial ultrasound scanning demonstrates a moderate amount of ascites within the left lateral abdomen. The left lateral abdomen was prepped and draped in the usual sterile fashion. 1% lidocaine was used for local anesthesia. Following this, a 19 gauge, 7-cm, Yueh catheter was introduced. An ultrasound image was saved for documentation purposes. The paracentesis was performed. The catheter was removed and a dressing was applied. The patient tolerated the procedure well without immediate post procedural complication. FINDINGS: A total of approximately 3.4 liters of yellow fluid was removed. Procedure was stopped prior to removal of fluid due to patient developing asymptomatic hypotension. IMPRESSION: Successful ultrasound-guided paracentesis yielding 3.4 liters of peritoneal fluid. Read by: Brynda Greathouse PA-C Electronically Signed   By: Jacqulynn Cadet M.D.   On: 09/14/2020 17:18     Principal Problem:   Cirrhosis of liver with ascites (H[facility]) Active Problems:   Acute congestive heart failure (HCC)   Acute kidney injury superimposed on CKD (HCC)   Foot ulcer, right, limited to breakdown of skin (H[facility])   Class 1 obesity due to excess calories with body mass index (BMI) of 32.0 to  32.9 in adult   DNR (do not resuscitate)     LOS: 1 day     Carmell Austria, MD 09/15/2020, 12:59 PM [facility] [phone]

## 2020-09-15 NOTE — Progress Notes (Signed)
PROGRESS NOTE    EL PILE Sr.  ONG:[id number] DOB: [birth year] DOA: 09/13/2020 PCP: Pleas Koch, NP    Brief Narrative:  87 year old gentleman with history of stroke, coronary artery disease, Karlene Lineman cirrhosis and recently diagnosed hepatocellular carcinoma on observation presented to the hospital with progressive shortness of breath, weakness and fall.  Patient also complaining of bloating up for last few months.  More than 10 pound weight gain in 1 month.  Difficulty walking around.  Lower extremity edema.  Lost balance and fell back when he was trying to get to the car.  Recent echocardiogram with ejection fraction 55%.   Assessment & Plan:   Principal Problem:   Cirrhosis of liver with ascites (HCC) Active Problems:   Acute congestive heart failure (HCC)   Acute kidney injury superimposed on CKD (HCC)   Foot ulcer, right, limited to breakdown of skin (HCC)   Class 1 obesity due to excess calories with body mass index (BMI) of 32.0 to 32.9 in adult   DNR (do not resuscitate)  Cirrhosis of liver with ascites/decompensated liver cirrhosis, new ascites.  Known hepatocellular carcinoma: Patient with difficult to control symptoms at home with conservative management and oral diuresis. Continue Aldactone.  Continue IV Lasix trial today. Paracentesis, 3.4 L fluid removed, unfortunately labs were not ordered. Followed by GI. Mobility. Will monitor whether he will tolerate IV diuresis today, if adequate tolerance, will change to oral Lasix and Aldactone tomorrow for discharge.  Acute kidney injury on chronic kidney disease stage IIIb. Functionally worsening kidney disease.  Monitor output.  Monitor renal functions with IV diuresis.  He is not a candidate for hemodialysis.  Right leg wound: Seen by wound care.  Local wound care.  Debility/frailty: Work with PT OT.  Lives alone at home.  Will need more supervision living. Followed by palliative care team.  DNR with limited  intervention. Wants to go home with home health, continue medical treatment including the scheduled MRCP.    DVT prophylaxis: SCDs Start: 09/13/20 1725   Code Status: DNR Family Communication: Patient's son at the bedside 4/1.  Unable to talk today. Disposition Plan: Status is: Inpatient.   Dispo: The patient is from: Home              Anticipated d/c is to: Home with home health.              Patient currently is not medically stable to d/c.   Difficult to place patient No         Consultants:   Gastroenterology  Interventional radiology  Palliative  Procedures:   Paracentesis, 3.4 L removed.  No labs.  Antimicrobials:   None.   Subjective: Patient seen and examined.  No overnight events.  He himself denies any complaints.  No more dizziness, however he has not mobilized yet.  Objective: Vitals:   09/14/20 1723 09/14/20 2000 09/15/20 0430 09/15/20 0818  BP: (!) 89/60  99/65 113/72  Pulse:   84 94  Resp:   18 18  Temp:   98.6 F (37 C) 98 F (36.7 C)  TempSrc:   Oral Oral  SpO2:  98% 93% 91%  Weight:   69.6 kg   Height:        Intake/Output Summary (Last 24 hours) at 09/15/2020 1148 Last data filed at 09/15/2020 0900 Gross per 24 hour  Intake 293 ml  Output 1250 ml  Net -957 ml   Filed Weights   09/13/20 1242 09/14/20 0500 09/15/20 0430  Weight: 76.6 kg 76.7 kg 69.6 kg    Examination:  General exam: Appears chronically sick looking.  Patient on 2 L oxygen.  Frail and debilitated.Marland Kitchen Respiratory system: Mostly bilateral clear. Cardiovascular system: S1 & S2 heard, RRR.  2+ bilateral pedal edema. Gastrointestinal system: Soft.  Nontender.  Distended.  Bowel sounds present.  No palpable fluid thrill. Central nervous system: Alert and oriented. No focal neurological deficits.  Generalized weakness. Extremities:  Right lateral foot ulceration with no apparent infection, pictures in the media section.    Data Reviewed: I have personally reviewed  following labs and imaging studies  CBC: Recent Labs  Lab 09/13/20 1323 09/13/20 1340 09/14/20 0426  WBC 10.8*  --  9.1  NEUTROABS 7.4  --   --   HGB 17.2* 17.3* 15.0  HCT 52.5* 51.0 44.9  MCV 99.1  --  95.7  PLT 191  --  518   Basic Metabolic Panel: Recent Labs  Lab 09/13/20 1323 09/13/20 1340 09/14/20 0426  NA 141 142 140  K 4.6 4.6 4.0  CL 105 106 105  CO2 27  --  28  GLUCOSE 124* 122* 90  BUN 21 26* 22  CREATININE 1.97* 1.70* 1.84*  CALCIUM 8.3*  --  8.1*   GFR: Estimated Creatinine Clearance: 21.4 mL/min (A) (by C-G formula based on SCr of 1.84 mg/dL (H)). Liver Function Tests: Recent Labs  Lab 09/13/20 1323 09/14/20 0426  AST 49* 32  ALT 30 25  ALKPHOS 395* 306*  BILITOT 1.6* 1.6*  PROT 6.2* 5.3*  ALBUMIN 2.5* 2.1*   No results for input(s): LIPASE, AMYLASE in the last 168 hours. No results for input(s): AMMONIA in the last 168 hours. Coagulation Profile: Recent Labs  Lab 09/13/20 1323 09/14/20 0426  INR 1.0 1.1   Cardiac Enzymes: No results for input(s): CKTOTAL, CKMB, CKMBINDEX, TROPONINI in the last 168 hours. BNP (last 3 results) Recent Labs    07/23/20 1023 08/02/20 1202  PROBNP 427.0* 354.0*   HbA1C: No results for input(s): HGBA1C in the last 72 hours. CBG: No results for input(s): GLUCAP in the last 168 hours. Lipid Profile: No results for input(s): CHOL, HDL, LDLCALC, TRIG, CHOLHDL, LDLDIRECT in the last 72 hours. Thyroid Function Tests: No results for input(s): TSH, T4TOTAL, FREET4, T3FREE, THYROIDAB in the last 72 hours. Anemia Panel: No results for input(s): VITAMINB12, FOLATE, FERRITIN, TIBC, IRON, RETICCTPCT in the last 72 hours. Sepsis Labs: No results for input(s): PROCALCITON, LATICACIDVEN in the last 168 hours.  Recent Results (from the past 240 hour(s))  Resp Panel by RT-PCR (Flu A&B, Covid) Nasopharyngeal Swab     Status: None   Collection Time: 09/13/20  1:33 PM   Specimen: Nasopharyngeal Swab; Nasopharyngeal(NP)  swabs in vial transport medium  Result Value Ref Range Status   SARS Coronavirus 2 by RT PCR NEGATIVE NEGATIVE Final    Comment: (NOTE) SARS-CoV-2 target nucleic acids are NOT DETECTED.  The SARS-CoV-2 RNA is generally detectable in upper respiratory specimens during the acute phase of infection. The lowest concentration of SARS-CoV-2 viral copies this assay can detect is 138 copies/mL. A negative result does not preclude SARS-Cov-2 infection and should not be used as the sole basis for treatment or other patient management decisions. A negative result may occur with  improper specimen collection/handling, submission of specimen other than nasopharyngeal swab, presence of viral mutation(s) within the areas targeted by this assay, and inadequate number of viral copies(<138 copies/mL). A negative result must be combined with clinical observations,  patient history, and epidemiological information. The expected result is Negative.  Fact Sheet for Patients:  EntrepreneurPulse.com.au  Fact Sheet for Healthcare Providers:  IncredibleEmployment.be  This test is no t yet approved or cleared by the [place] FDA and  has been authorized for detection and/or diagnosis of SARS-CoV-2 by FDA under an Emergency Use Authorization (EUA). This EUA will remain  in effect (meaning this test can be used) for the duration of the COVID-19 declaration under Section 564(b)(1) of the Act, 21 U.S.C.section 360bbb-3(b)(1), unless the authorization is terminated  or revoked sooner.       Influenza A by PCR NEGATIVE NEGATIVE Final   Influenza B by PCR NEGATIVE NEGATIVE Final    Comment: (NOTE) The Xpert Xpress SARS-CoV-2/FLU/RSV plus assay is intended as an aid in the diagnosis of influenza from Nasopharyngeal swab specimens and should not be used as a sole basis for treatment. Nasal washings and aspirates are unacceptable for Xpert Xpress  SARS-CoV-2/FLU/RSV testing.  Fact Sheet for Patients: EntrepreneurPulse.com.au  Fact Sheet for Healthcare Providers: IncredibleEmployment.be  This test is not yet approved or cleared by the [place] FDA and has been authorized for detection and/or diagnosis of SARS-CoV-2 by FDA under an Emergency Use Authorization (EUA). This EUA will remain in effect (meaning this test can be used) for the duration of the COVID-19 declaration under Section 564(b)(1) of the Act, 21 U.S.C. section 360bbb-3(b)(1), unless the authorization is terminated or revoked.  Performed at [facility], [place] [address], [place], [place] [address]          Radiology Studies: CT Head Wo Contrast  Result Date: 09/13/2020 CLINICAL DATA:  Pain following fall EXAM: CT HEAD WITHOUT CONTRAST TECHNIQUE: Contiguous axial images were obtained from the base of the skull through the vertex without intravenous contrast. COMPARISON:  January 30, 2016 head CT and brain MRI. FINDINGS: Brain: Moderate diffuse atrophy is stable. There is no intracranial mass, hemorrhage, extra-axial fluid collection, or midline shift. There is evidence of a prior small infarct in the posterior mid left cerebellum, stable. There is evidence of a prior infarct in the right mid parietal lobe posteriorly, stable. There is decreased attenuation in periventricular white matter consistent with periventricular small vessel disease. No acute infarct is evident. Vascular: No hyperdense vessel. Calcification noted in each carotid siphon region. Skull: Bony calvarium appears intact. Stable 7 mm left frontal dural calcification, a likely small enostosis. No surrounding edema. This is a finding of no clinical significance. Sinuses/Orbits: There is slight mucosal thickening in several ethmoid air cells. Other visualized paranasal sinuses are clear. Orbits appear symmetric bilaterally. Other: Mastoid air cells are clear.  IMPRESSION: Atrophy with periventricular small vessel disease. Prior infarct in the mid to posterior right parietal lobe and in the posterior mid left cerebellum. No acute infarct appreciable. No mass or hemorrhage. There are foci of arterial vascular calcification. There is mucosal thickening in several ethmoid air cells. Electronically Signed   By: Lowella Grip III M.D.   On: 09/13/2020 15:11   US Abdomen Limited  Result Date: 09/14/2020 CLINICAL DATA:  87 year old with abdominal ascites. History of hepatocellular carcinoma. EXAM: ULTRASOUND ABDOMEN LIMITED RIGHT UPPER QUADRANT COMPARISON:  MRI 07/05/2020 FINDINGS: Gallbladder: Not visualized. Common bile duct: Diameter: 4 mm Liver: Liver is severely nodular and compatible with cirrhosis. Liver parenchyma is mildly heterogeneous. Large amount of perihepatic ascites. Patient has a known right hepatic lesion that is not clearly identified on this examination. Portal vein is patent on color Doppler imaging  with normal direction of blood flow towards the liver. Other: Minimal fluid in the right lower quadrant. Large amount of ascites in the left upper quadrant. Large amount of ascites in left lower quadrant. IMPRESSION: 1. Cirrhotic liver with a large amount of ascites. 2. Gallbladder is not visualized. 3. Known hepatic lesion is not identified on this examination. Electronically Signed   By: Markus Daft M.D.   On: 09/14/2020 16:54   DG Chest Port 1 View  Result Date: 09/13/2020 CLINICAL DATA:  Shortness of breath. EXAM: PORTABLE CHEST 1 VIEW COMPARISON:  Chest x-ray dated June 06, 2020. FINDINGS: The heart size and mediastinal contours are within normal limits. Normal pulmonary vascularity. Emphysematous changes again noted. Increasing small left pleural effusion with left basilar opacity. Unchanged scarring at the right lung base. No pneumothorax. No acute osseous abnormality. IMPRESSION: 1. Increasing small left pleural effusion with left basilar  atelectasis versus infiltrate. Electronically Signed   By: Titus Dubin M.D.   On: 09/13/2020 13:56   IR Paracentesis  Result Date: 09/14/2020 INDICATION: Patient with history of cirrhosis found to have ascites. Request is made for therapeutic paracentesis. EXAM: ULTRASOUND GUIDED THERAPEUTIC PARACENTESIS MEDICATIONS: 10 mL 1% lidocaine COMPLICATIONS: None immediate. PROCEDURE: Informed written consent was obtained from the patient after a discussion of the risks, benefits and alternatives to treatment. A timeout was performed prior to the initiation of the procedure. Initial ultrasound scanning demonstrates a moderate amount of ascites within the left lateral abdomen. The left lateral abdomen was prepped and draped in the usual sterile fashion. 1% lidocaine was used for local anesthesia. Following this, a 19 gauge, 7-cm, Yueh catheter was introduced. An ultrasound image was saved for documentation purposes. The paracentesis was performed. The catheter was removed and a dressing was applied. The patient tolerated the procedure well without immediate post procedural complication. FINDINGS: A total of approximately 3.4 liters of yellow fluid was removed. Procedure was stopped prior to removal of fluid due to patient developing asymptomatic hypotension. IMPRESSION: Successful ultrasound-guided paracentesis yielding 3.4 liters of peritoneal fluid. Read by: Brynda Greathouse PA-C Electronically Signed   By: Jacqulynn Cadet M.D.   On: 09/14/2020 17:18        Scheduled Meds: . docusate sodium  100 mg Oral BID  . feeding supplement  237 mL Oral BID BM  . furosemide  40 mg Intravenous BID  . multivitamin with minerals  1 tablet Oral Daily  . pantoprazole  40 mg Oral BID  . sodium chloride flush  3 mL Intravenous Q12H  . spironolactone  50 mg Oral Daily   Continuous Infusions:   LOS: 1 day    Time spent: 30 minutes    Barb Merino, MD [facility] Pager [phone]

## 2020-09-16 DIAGNOSIS — N179 Acute kidney failure, unspecified: Secondary | ICD-10-CM | POA: Diagnosis not present

## 2020-09-16 DIAGNOSIS — K7460 Unspecified cirrhosis of liver: Secondary | ICD-10-CM | POA: Diagnosis not present

## 2020-09-16 DIAGNOSIS — R188 Other ascites: Secondary | ICD-10-CM | POA: Diagnosis not present

## 2020-09-16 DIAGNOSIS — N189 Chronic kidney disease, unspecified: Secondary | ICD-10-CM | POA: Diagnosis not present

## 2020-09-16 LAB — CBC WITH DIFFERENTIAL/PLATELET
Abs Immature Granulocytes: 0.08 10*3/uL — ABNORMAL HIGH (ref 0.00–0.07)
Basophils Absolute: 0.1 10*3/uL (ref 0.0–0.1)
Basophils Relative: 1 %
Eosinophils Absolute: 0.2 10*3/uL (ref 0.0–0.5)
Eosinophils Relative: 2 %
HCT: 45.3 % (ref 39.0–52.0)
Hemoglobin: 15.2 g/dL (ref 13.0–17.0)
Immature Granulocytes: 1 %
Lymphocytes Relative: 22 %
Lymphs Abs: 2 10*3/uL (ref 0.7–4.0)
MCH: 31.9 pg (ref 26.0–34.0)
MCHC: 33.6 g/dL (ref 30.0–36.0)
MCV: 95.2 fL (ref 80.0–100.0)
Monocytes Absolute: 0.9 10*3/uL (ref 0.1–1.0)
Monocytes Relative: 10 %
Neutro Abs: 5.7 10*3/uL (ref 1.7–7.7)
Neutrophils Relative %: 64 %
Platelets: 144 10*3/uL — ABNORMAL LOW (ref 150–400)
RBC: 4.76 MIL/uL (ref 4.22–5.81)
RDW: 13.7 % (ref 11.5–15.5)
WBC: 8.9 10*3/uL (ref 4.0–10.5)
nRBC: 0 % (ref 0.0–0.2)

## 2020-09-16 LAB — COMPREHENSIVE METABOLIC PANEL
ALT: 23 U/L (ref 0–44)
AST: 33 U/L (ref 15–41)
Albumin: 2.2 g/dL — ABNORMAL LOW (ref 3.5–5.0)
Alkaline Phosphatase: 248 U/L — ABNORMAL HIGH (ref 38–126)
Anion gap: 8 (ref 5–15)
BUN: 31 mg/dL — ABNORMAL HIGH (ref 8–23)
CO2: 27 mmol/L (ref 22–32)
Calcium: 7.9 mg/dL — ABNORMAL LOW (ref 8.9–10.3)
Chloride: 103 mmol/L (ref 98–111)
Creatinine, Ser: 1.59 mg/dL — ABNORMAL HIGH (ref 0.61–1.24)
GFR, Estimated: 41 mL/min — ABNORMAL LOW (ref >60)
Glucose, Bld: 110 mg/dL — ABNORMAL HIGH (ref 70–99)
Potassium: 4.3 mmol/L (ref 3.5–5.1)
Sodium: 138 mmol/L (ref 135–145)
Total Bilirubin: 1.1 mg/dL (ref 0.3–1.2)
Total Protein: 5 g/dL — ABNORMAL LOW (ref 6.5–8.1)

## 2020-09-16 LAB — MAGNESIUM: Magnesium: 2.1 mg/dL (ref 1.7–2.4)

## 2020-09-16 LAB — AFP TUMOR MARKER: AFP, Serum, Tumor Marker: 204 ng/mL — ABNORMAL HIGH (ref 0.0–6.4)

## 2020-09-16 LAB — PHOSPHORUS: Phosphorus: 2.8 mg/dL (ref 2.5–4.6)

## 2020-09-16 MED ORDER — MIDODRINE HCL 5 MG PO TABS
5.0000 mg | ORAL_TABLET | Freq: Three times a day (TID) | ORAL | Status: DC
  Administered 2020-09-16 – 2020-09-21 (×15): 5 mg via ORAL
  Filled 2020-09-16 (×14): qty 1

## 2020-09-16 MED ORDER — FUROSEMIDE 20 MG PO TABS
20.0000 mg | ORAL_TABLET | Freq: Every day | ORAL | Status: DC
  Administered 2020-09-16 – 2020-09-20 (×5): 20 mg via ORAL
  Filled 2020-09-16 (×5): qty 1

## 2020-09-16 NOTE — Progress Notes (Signed)
PROGRESS NOTE    David Bradshaw.  WSF:[id number] DOB: [birth year] DOA: 09/13/2020 PCP: Pleas Koch, NP    Brief Narrative:  87 year old gentleman with history of stroke, coronary artery disease, Karlene Lineman cirrhosis and recently diagnosed hepatocellular carcinoma on observation presented to the hospital with progressive shortness of breath, weakness and fall.  Patient also complaining of bloating up for last few months.  More than 10 pound weight gain in 1 month.  Difficulty walking around.  Lower extremity edema.  Lost balance and fell back when he was trying to get to the car.  Recent echocardiogram with ejection fraction 55%.   Assessment & Plan:   Principal Problem:   Cirrhosis of liver with ascites (HCC) Active Problems:   Acute congestive heart failure (HCC)   Acute kidney injury superimposed on CKD (HCC)   Foot ulcer, right, limited to breakdown of skin (HCC)   Class 1 obesity due to excess calories with body mass index (BMI) of 32.0 to 32.9 in adult   DNR (do not resuscitate)   Malnutrition of moderate degree  Cirrhosis of liver with ascites/decompensated liver cirrhosis.  Known hepatocellular carcinoma: Patient with difficult to control symptoms at home with conservative management and oral diuresis. Patient was tried on high-dose Lasix, significant orthostatic drop in systolic blood pressure and dizziness.  Not tolerating high dose diuresis. Decrease Lasix to 20 mg daily, continue Aldactone 50 mg daily. We will add midodrine to improve orthostatic blood pressures in clinic with rehab options. Paracentesis, 3.4 L fluid removed, unfortunately labs were not ordered. Followed by GI. Mobility. Continue to mobilize with PT OT.  Recommended skilled nursing facility.  Acute kidney injury on chronic kidney disease stage IIIb. Functionally worsening kidney disease.  Monitor output.  He is not a candidate for hemodialysis.  Right leg wound: Seen by wound care.  Local wound  care.  Debility/frailty: Work with PT OT.  Lives alone at home.  Followed by palliative care team.  DNR with limited intervention. Refer to skilled nursing facility.    DVT prophylaxis: SCDs Start: 09/13/20 1725   Code Status: DNR Family Communication: Patient's son called, he is unable to speak of the phone. Disposition Plan: Status is: Inpatient.   Dispo: The patient is from: Home              Anticipated d/c is to: Skilled nursing facility.              Patient currently is not medically stable to d/c.   Difficult to place patient No         Consultants:   Gastroenterology  Interventional radiology  Palliative  Procedures:   Paracentesis, 3.4 L removed.  No labs.  Antimicrobials:   None.   Subjective: Patient seen and examined.  No overnight events.  On room air.  Symptomatic orthostatic blood pressure drop on waking up.  Lasix dose was held last night. Objective: Vitals:   09/15/20 1945 09/16/20 0457 09/16/20 0903 09/16/20 1140  BP: (!) 87/69 93/72 100/73 (!) 86/63  Pulse: 98 81 93 87  Resp: 16 18 20 16   Temp: 97.7 F (36.5 C) (!) 97.4 F (36.3 C) 97.7 F (36.5 C) (!) 97.4 F (36.3 C)  TempSrc: Oral Oral Oral Oral  SpO2:  94% 93% 90%  Weight:  70.3 kg    Height:        Intake/Output Summary (Last 24 hours) at 09/16/2020 1316 Last data filed at 09/16/2020 0908 Gross per 24 hour  Intake 240  ml  Output 401 ml  Net -161 ml   Filed Weights   09/14/20 0500 09/15/20 0430 09/16/20 0457  Weight: 76.7 kg 69.6 kg 70.3 kg    Examination:  General exam: Appears chronically sick looking.  Patient on room air.  Frail and debilitated. Respiratory system: Mostly bilateral clear. Cardiovascular system: S1 & S2 heard, RRR.  2+ bilateral pedal edema. Gastrointestinal system: Soft.  Nontender.  Distended.  Bowel sounds present.  No palpable fluid thrill. Central nervous system: Alert and oriented. No focal neurological deficits.  Generalized  weakness. Extremities:  Right lateral foot ulceration with no apparent infection, pictures in the media section.    Data Reviewed: I have personally reviewed following labs and imaging studies  CBC: Recent Labs  Lab 09/13/20 1323 09/13/20 1340 09/14/20 0426 09/16/20 0327  WBC 10.8*  --  9.1 8.9  NEUTROABS 7.4  --   --  5.7  HGB 17.2* 17.3* 15.0 15.2  HCT 52.5* 51.0 44.9 45.3  MCV 99.1  --  95.7 95.2  PLT 191  --  175 007*   Basic Metabolic Panel: Recent Labs  Lab 09/13/20 1323 09/13/20 1340 09/14/20 0426 09/16/20 0327  NA 141 142 140 138  K 4.6 4.6 4.0 4.3  CL 105 106 105 103  CO2 27  --  28 27  GLUCOSE 124* 122* 90 110*  BUN 21 26* 22 31*  CREATININE 1.97* 1.70* 1.84* 1.59*  CALCIUM 8.3*  --  8.1* 7.9*  MG  --   --   --  2.1  PHOS  --   --   --  2.8   GFR: Estimated Creatinine Clearance: 24.9 mL/min (A) (by C-G formula based on SCr of 1.59 mg/dL (H)). Liver Function Tests: Recent Labs  Lab 09/13/20 1323 09/14/20 0426 09/16/20 0327  AST 49* 32 33  ALT 30 25 23   ALKPHOS 395* 306* 248*  BILITOT 1.6* 1.6* 1.1  PROT 6.2* 5.3* 5.0*  ALBUMIN 2.5* 2.1* 2.2*   No results for input(s): LIPASE, AMYLASE in the last 168 hours. No results for input(s): AMMONIA in the last 168 hours. Coagulation Profile: Recent Labs  Lab 09/13/20 1323 09/14/20 0426  INR 1.0 1.1   Cardiac Enzymes: No results for input(s): CKTOTAL, CKMB, CKMBINDEX, TROPONINI in the last 168 hours. BNP (last 3 results) Recent Labs    07/23/20 1023 08/02/20 1202  PROBNP 427.0* 354.0*   HbA1C: No results for input(s): HGBA1C in the last 72 hours. CBG: No results for input(s): GLUCAP in the last 168 hours. Lipid Profile: No results for input(s): CHOL, HDL, LDLCALC, TRIG, CHOLHDL, LDLDIRECT in the last 72 hours. Thyroid Function Tests: No results for input(s): TSH, T4TOTAL, FREET4, T3FREE, THYROIDAB in the last 72 hours. Anemia Panel: No results for input(s): VITAMINB12, FOLATE, FERRITIN,  TIBC, IRON, RETICCTPCT in the last 72 hours. Sepsis Labs: No results for input(s): PROCALCITON, LATICACIDVEN in the last 168 hours.  Recent Results (from the past 240 hour(s))  Resp Panel by RT-PCR (Flu A&B, Covid) Nasopharyngeal Swab     Status: None   Collection Time: 09/13/20  1:33 PM   Specimen: Nasopharyngeal Swab; Nasopharyngeal(NP) swabs in vial transport medium  Result Value Ref Range Status   SARS Coronavirus 2 by RT PCR NEGATIVE NEGATIVE Final    Comment: (NOTE) SARS-CoV-2 target nucleic acids are NOT DETECTED.  The SARS-CoV-2 RNA is generally detectable in upper respiratory specimens during the acute phase of infection. The lowest concentration of SARS-CoV-2 viral copies this assay can detect  is 138 copies/mL. A negative result does not preclude SARS-Cov-2 infection and should not be used as the sole basis for treatment or other patient management decisions. A negative result may occur with  improper specimen collection/handling, submission of specimen other than nasopharyngeal swab, presence of viral mutation(s) within the areas targeted by this assay, and inadequate number of viral copies(<138 copies/mL). A negative result must be combined with clinical observations, patient history, and epidemiological information. The expected result is Negative.  Fact Sheet for Patients:  EntrepreneurPulse.com.au  Fact Sheet for Healthcare Providers:  IncredibleEmployment.be  This test is no t yet approved or cleared by the [place] FDA and  has been authorized for detection and/or diagnosis of SARS-CoV-2 by FDA under an Emergency Use Authorization (EUA). This EUA will remain  in effect (meaning this test can be used) for the duration of the COVID-19 declaration under Section 564(b)(1) of the Act, 21 U.S.C.section 360bbb-3(b)(1), unless the authorization is terminated  or revoked sooner.       Influenza A by PCR NEGATIVE NEGATIVE Final    Influenza B by PCR NEGATIVE NEGATIVE Final    Comment: (NOTE) The Xpert Xpress SARS-CoV-2/FLU/RSV plus assay is intended as an aid in the diagnosis of influenza from Nasopharyngeal swab specimens and should not be used as a sole basis for treatment. Nasal washings and aspirates are unacceptable for Xpert Xpress SARS-CoV-2/FLU/RSV testing.  Fact Sheet for Patients: EntrepreneurPulse.com.au  Fact Sheet for Healthcare Providers: IncredibleEmployment.be  This test is not yet approved or cleared by the [place] FDA and has been authorized for detection and/or diagnosis of SARS-CoV-2 by FDA under an Emergency Use Authorization (EUA). This EUA will remain in effect (meaning this test can be used) for the duration of the COVID-19 declaration under Section 564(b)(1) of the Act, 21 U.S.C. section 360bbb-3(b)(1), unless the authorization is terminated or revoked.  Performed at [facility], [place] [address], [place], [place] [address]          Radiology Studies: US Abdomen Limited  Result Date: 09/14/2020 CLINICAL DATA:  87 year old with abdominal ascites. History of hepatocellular carcinoma. EXAM: ULTRASOUND ABDOMEN LIMITED RIGHT UPPER QUADRANT COMPARISON:  MRI 07/05/2020 FINDINGS: Gallbladder: Not visualized. Common bile duct: Diameter: 4 mm Liver: Liver is severely nodular and compatible with cirrhosis. Liver parenchyma is mildly heterogeneous. Large amount of perihepatic ascites. Patient has a known right hepatic lesion that is not clearly identified on this examination. Portal vein is patent on color Doppler imaging with normal direction of blood flow towards the liver. Other: Minimal fluid in the right lower quadrant. Large amount of ascites in the left upper quadrant. Large amount of ascites in left lower quadrant. IMPRESSION: 1. Cirrhotic liver with a large amount of ascites. 2. Gallbladder is not visualized. 3. Known hepatic lesion is not  identified on this examination. Electronically Signed   By: Markus Daft M.D.   On: 09/14/2020 16:54   IR Paracentesis  Result Date: 09/14/2020 INDICATION: Patient with history of cirrhosis found to have ascites. Request is made for therapeutic paracentesis. EXAM: ULTRASOUND GUIDED THERAPEUTIC PARACENTESIS MEDICATIONS: 10 mL 1% lidocaine COMPLICATIONS: None immediate. PROCEDURE: Informed written consent was obtained from the patient after a discussion of the risks, benefits and alternatives to treatment. A timeout was performed prior to the initiation of the procedure. Initial ultrasound scanning demonstrates a moderate amount of ascites within the left lateral abdomen. The left lateral abdomen was prepped and draped in the usual sterile fashion. 1% lidocaine was used for local  anesthesia. Following this, a 19 gauge, 7-cm, Yueh catheter was introduced. An ultrasound image was saved for documentation purposes. The paracentesis was performed. The catheter was removed and a dressing was applied. The patient tolerated the procedure well without immediate post procedural complication. FINDINGS: A total of approximately 3.4 liters of yellow fluid was removed. Procedure was stopped prior to removal of fluid due to patient developing asymptomatic hypotension. IMPRESSION: Successful ultrasound-guided paracentesis yielding 3.4 liters of peritoneal fluid. Read by: Brynda Greathouse PA-C Electronically Signed   By: Jacqulynn Cadet M.D.   On: 09/14/2020 17:18        Scheduled Meds: . docusate sodium  100 mg Oral BID  . feeding supplement  237 mL Oral BID BM  . furosemide  20 mg Oral Daily  . midodrine  5 mg Oral TID WC  . multivitamin with minerals  1 tablet Oral Daily  . pantoprazole  40 mg Oral BID  . sodium chloride flush  3 mL Intravenous Q12H  . spironolactone  50 mg Oral Daily   Continuous Infusions:   LOS: 2 days    Time spent: 30 minutes    Barb Merino, MD [facility] Pager  [phone]

## 2020-09-16 NOTE — TOC Progression Note (Signed)
Transition of Care (TOC) - Progression Note    Patient Details  Name: David GRACA Sr. MRN: [id number] Date of Birth: 01-Oct-1926  Transition of Care [facility]) CM/SW [place], [place] Phone Number: [phone] 09/16/2020, 4:00 PM  Clinical Narrative:     CSW met with patient's son [name] at bedside to discuss discharge planning. CSW infomed [name] that [facility] denied and provided him with the bed offer of [facility] so far. CSW explained that a facility had to be chosen first before authorization could be started. It was decided that to wait until tomorrow to see if any more bed offers come in.  [name] has requested that he be reached out his home number with the bed offers.  TOC team will continue to assist with discharge planning needs.   Expected Discharge Plan: [place] Barriers to Discharge: Continued Medical Work up,Insurance Authorization,SNF Pending bed offer  Expected Discharge Plan and Services Expected Discharge Plan: [place] arrangements for the past 2 months: Single Family Home                                       Social Determinants of Health (SDOH) Interventions    Readmission Risk Interventions No flowsheet data found.

## 2020-09-17 DIAGNOSIS — N189 Chronic kidney disease, unspecified: Secondary | ICD-10-CM | POA: Diagnosis not present

## 2020-09-17 DIAGNOSIS — K7460 Unspecified cirrhosis of liver: Secondary | ICD-10-CM | POA: Diagnosis not present

## 2020-09-17 DIAGNOSIS — N179 Acute kidney failure, unspecified: Secondary | ICD-10-CM | POA: Diagnosis not present

## 2020-09-17 DIAGNOSIS — R188 Other ascites: Secondary | ICD-10-CM | POA: Diagnosis not present

## 2020-09-17 NOTE — Plan of Care (Signed)
  Problem: Clinical Measurements: Goal: Respiratory complications will improve Outcome: Progressing   Problem: Safety: Goal: Ability to remain free from injury will improve Outcome: Progressing   

## 2020-09-17 NOTE — Progress Notes (Signed)
PROGRESS NOTE    David QUACH Sr.  YBO:[id number] DOB: [birth year] DOA: 09/13/2020 PCP: Pleas Koch, NP    Brief Narrative:  87 year old gentleman with history of stroke, coronary artery disease, Karlene Lineman cirrhosis and recently diagnosed hepatocellular carcinoma on observation presented to the hospital with progressive shortness of breath, weakness and fall.  Patient also complaining of bloating up for last few months.  More than 10 pound weight gain in 1 month.  Difficulty walking around.  Lower extremity edema.  Lost balance and fell back when he was trying to get to the car.  Recent echocardiogram with ejection fraction 55%.   Assessment & Plan:   Principal Problem:   Cirrhosis of liver with ascites (HCC) Active Problems:   Acute congestive heart failure (HCC)   Acute kidney injury superimposed on CKD (HCC)   Foot ulcer, right, limited to breakdown of skin (HCC)   Class 1 obesity due to excess calories with body mass index (BMI) of 32.0 to 32.9 in adult   DNR (do not resuscitate)   Malnutrition of moderate degree  Cirrhosis of liver with ascites/decompensated liver cirrhosis.  Known hepatocellular carcinoma: Patient with difficult to control symptoms at home with conservative management and oral diuresis. Patient was tried on high-dose Lasix, significant orthostatic drop in systolic blood pressure and dizziness.  Not tolerating high dose diuresis. Decreased to Lasix to 20 mg daily, continue Aldactone 50 mg daily.  Added midodrine 5 mg 3 times a day. Paracentesis, 3.4 L fluid removed, unfortunately labs were not ordered. Seen by gastroenterology.  No new recommendation at this time.  He has ERCP planned as outpatient. Mobility. Continue to mobilize with PT OT.  Recommended skilled nursing facility.  Acute kidney injury on chronic kidney disease stage IIIb. Remains stable today.  Monitor output.  He is not a candidate for hemodialysis.  Right leg wound: Seen by wound care.   Local wound care.  Debility/frailty: Work with PT OT.  Lives alone at home.  Followed by palliative care team.  DNR with limited intervention. Refer to skilled nursing facility.  Continue to work with PT OT nursing staff in the hospital.  Medically stable to go to SNF if bed available.  DVT prophylaxis: SCDs Start: 09/13/20 1725   Code Status: DNR Family Communication: None today. Disposition Plan: Status is: Inpatient.   Dispo: The patient is from: Home              Anticipated d/c is to: Skilled nursing facility.              Patient currently is medically stable.   Difficult to place patient No         Consultants:   Gastroenterology  Interventional radiology  Palliative  Procedures:   Paracentesis, 3.4 L removed.  No labs.  Antimicrobials:   None.   Subjective: Patient seen and examined.  No overnight events.  He was able to stand up with 20 point drop in orthostatics, however did not feel any dizziness.  He thinks leg swelling has come back. Objective: Vitals:   09/16/20 1936 09/17/20 0524 09/17/20 1021 09/17/20 1107  BP: (!) 87/53 (!) 85/72  (!) 89/67  Pulse: 98 79  90  Resp: 18 17  17   Temp: 97.7 F (36.5 C) 98.3 F (36.8 C)  98.2 F (36.8 C)  TempSrc: Oral Oral    SpO2: 91% 91% 97% 90%  Weight:  70.3 kg    Height:        Intake/Output  Summary (Last 24 hours) at 09/17/2020 1124 Last data filed at 09/17/2020 0754 Gross per 24 hour  Intake 600 ml  Output --  Net 600 ml   Filed Weights   09/15/20 0430 09/16/20 0457 09/17/20 0524  Weight: 69.6 kg 70.3 kg 70.3 kg    Examination:  General exam: Appears chronically sick looking.  Patient on room air.  Frail and debilitated. Respiratory system: Bilaterally clear. Cardiovascular system: S1 & S2 heard, RRR.  2+ bilateral pedal edema. Gastrointestinal system: Soft.  Nontender.  Distended.  Bowel sounds present.  No palpable fluid thrill. Central nervous system: Alert and oriented. No focal  neurological deficits.  Generalized weakness. Extremities:  Right lateral foot ulceration with no apparent infection, pictures in the media section.    Data Reviewed: I have personally reviewed following labs and imaging studies  CBC: Recent Labs  Lab 09/13/20 1323 09/13/20 1340 09/14/20 0426 09/16/20 0327  WBC 10.8*  --  9.1 8.9  NEUTROABS 7.4  --   --  5.7  HGB 17.2* 17.3* 15.0 15.2  HCT 52.5* 51.0 44.9 45.3  MCV 99.1  --  95.7 95.2  PLT 191  --  175 563*   Basic Metabolic Panel: Recent Labs  Lab 09/13/20 1323 09/13/20 1340 09/14/20 0426 09/16/20 0327  NA 141 142 140 138  K 4.6 4.6 4.0 4.3  CL 105 106 105 103  CO2 27  --  28 27  GLUCOSE 124* 122* 90 110*  BUN 21 26* 22 31*  CREATININE 1.97* 1.70* 1.84* 1.59*  CALCIUM 8.3*  --  8.1* 7.9*  MG  --   --   --  2.1  PHOS  --   --   --  2.8   GFR: Estimated Creatinine Clearance: 24.9 mL/min (A) (by C-G formula based on SCr of 1.59 mg/dL (H)). Liver Function Tests: Recent Labs  Lab 09/13/20 1323 09/14/20 0426 09/16/20 0327  AST 49* 32 33  ALT 30 25 23   ALKPHOS 395* 306* 248*  BILITOT 1.6* 1.6* 1.1  PROT 6.2* 5.3* 5.0*  ALBUMIN 2.5* 2.1* 2.2*   No results for input(s): LIPASE, AMYLASE in the last 168 hours. No results for input(s): AMMONIA in the last 168 hours. Coagulation Profile: Recent Labs  Lab 09/13/20 1323 09/14/20 0426  INR 1.0 1.1   Cardiac Enzymes: No results for input(s): CKTOTAL, CKMB, CKMBINDEX, TROPONINI in the last 168 hours. BNP (last 3 results) Recent Labs    07/23/20 1023 08/02/20 1202  PROBNP 427.0* 354.0*   HbA1C: No results for input(s): HGBA1C in the last 72 hours. CBG: No results for input(s): GLUCAP in the last 168 hours. Lipid Profile: No results for input(s): CHOL, HDL, LDLCALC, TRIG, CHOLHDL, LDLDIRECT in the last 72 hours. Thyroid Function Tests: No results for input(s): TSH, T4TOTAL, FREET4, T3FREE, THYROIDAB in the last 72 hours. Anemia Panel: No results for  input(s): VITAMINB12, FOLATE, FERRITIN, TIBC, IRON, RETICCTPCT in the last 72 hours. Sepsis Labs: No results for input(s): PROCALCITON, LATICACIDVEN in the last 168 hours.  Recent Results (from the past 240 hour(s))  Resp Panel by RT-PCR (Flu A&B, Covid) Nasopharyngeal Swab     Status: None   Collection Time: 09/13/20  1:33 PM   Specimen: Nasopharyngeal Swab; Nasopharyngeal(NP) swabs in vial transport medium  Result Value Ref Range Status   SARS Coronavirus 2 by RT PCR NEGATIVE NEGATIVE Final    Comment: (NOTE) SARS-CoV-2 target nucleic acids are NOT DETECTED.  The SARS-CoV-2 RNA is generally detectable in upper respiratory  specimens during the acute phase of infection. The lowest concentration of SARS-CoV-2 viral copies this assay can detect is 138 copies/mL. A negative result does not preclude SARS-Cov-2 infection and should not be used as the sole basis for treatment or other patient management decisions. A negative result may occur with  improper specimen collection/handling, submission of specimen other than nasopharyngeal swab, presence of viral mutation(s) within the areas targeted by this assay, and inadequate number of viral copies(<138 copies/mL). A negative result must be combined with clinical observations, patient history, and epidemiological information. The expected result is Negative.  Fact Sheet for Patients:  EntrepreneurPulse.com.au  Fact Sheet for Healthcare Providers:  IncredibleEmployment.be  This test is no t yet approved or cleared by the [place] FDA and  has been authorized for detection and/or diagnosis of SARS-CoV-2 by FDA under an Emergency Use Authorization (EUA). This EUA will remain  in effect (meaning this test can be used) for the duration of the COVID-19 declaration under Section 564(b)(1) of the Act, 21 U.S.C.section 360bbb-3(b)(1), unless the authorization is terminated  or revoked sooner.        Influenza A by PCR NEGATIVE NEGATIVE Final   Influenza B by PCR NEGATIVE NEGATIVE Final    Comment: (NOTE) The Xpert Xpress SARS-CoV-2/FLU/RSV plus assay is intended as an aid in the diagnosis of influenza from Nasopharyngeal swab specimens and should not be used as a sole basis for treatment. Nasal washings and aspirates are unacceptable for Xpert Xpress SARS-CoV-2/FLU/RSV testing.  Fact Sheet for Patients: EntrepreneurPulse.com.au  Fact Sheet for Healthcare Providers: IncredibleEmployment.be  This test is not yet approved or cleared by the [place] FDA and has been authorized for detection and/or diagnosis of SARS-CoV-2 by FDA under an Emergency Use Authorization (EUA). This EUA will remain in effect (meaning this test can be used) for the duration of the COVID-19 declaration under Section 564(b)(1) of the Act, 21 U.S.C. section 360bbb-3(b)(1), unless the authorization is terminated or revoked.  Performed at [facility], [place] [address], [place],  [address]          Radiology Studies: No results found.      Scheduled Meds: . docusate sodium  100 mg Oral BID  . feeding supplement  237 mL Oral BID BM  . furosemide  20 mg Oral Daily  . midodrine  5 mg Oral TID WC  . multivitamin with minerals  1 tablet Oral Daily  . pantoprazole  40 mg Oral BID  . sodium chloride flush  3 mL Intravenous Q12H  . spironolactone  50 mg Oral Daily   Continuous Infusions:   LOS: 3 days    Time spent: 30 minutes    Barb Merino, MD [facility] Pager [phone]

## 2020-09-17 NOTE — Telephone Encounter (Signed)
I would hold off on this for now, he is admitted and inpatient team will determine what he is taking when he is out of the hospital. Thanks

## 2020-09-17 NOTE — Telephone Encounter (Signed)
Rec'd refill request for lasix and spironolactone but he is currently admitted. Has had recent labs. Please advise.

## 2020-09-17 NOTE — Progress Notes (Signed)
Physical Therapy Treatment Patient Details Name: David WEIGHTMAN Sr. MRN: [id number] DOB: Mar 18, 1927 Today's Date: 09/17/2020    History of Present Illness 87 y.o. male presents to [facility] ED on 09/13/2020 with SOB and fall. Pt admitted with ascites, volume overload, and R foot wounds. Past medical history significant of CVA; CAD; cirrhosis with hepatocellular CA; and carotid and aortic stenosis.    PT Comments    Patient progressing well towards PT goals. Tolerated gait training today with Min A-Min guard for balance/safety. Initially using RW for support progressing to no RW (as pt does not use one at home). HR ranged from 80s-123 bpm with activity. Orthostatics- supine BP 107/84 HR 84 bpm, sitting BP 90/66 HR 98 bpm, standing BP 99/87, HR 102 bpm, sitting post walk BP 91/64, asymptomatic. Sp02 remained >95% on RA throughout session so removed 02. RN made aware. Will likely continue to improve with increased activity. Encouraged walking a few times daily. Safe to d/c home if family able to provide some initial assist. Will follow.    Follow Up Recommendations  Home health PT;Supervision for mobility/OOB     Equipment Recommendations  None recommended by PT    Recommendations for Other Services       Precautions / Restrictions Precautions Precautions: Fall;Other (comment) Precaution Comments: watch BP-low Restrictions Weight Bearing Restrictions: No    Mobility  Bed Mobility Overal bed mobility: Needs Assistance Bed Mobility: Supine to Sit     Supine to sit: Min guard;HOB elevated     General bed mobility comments: Heavy use of rail to get to EOB, no physical assist needed.    Transfers Overall transfer level: Needs assistance Equipment used: Rolling walker (2 wheeled) Transfers: Sit to/from Stand Sit to Stand: Min guard         General transfer comment: min guard for safety. Stood from [employer], transferred to chair post ambulation.  Ambulation/Gait Ambulation/Gait  assistance: Min guard;Min assist Gait Distance (Feet): 100 Feet Assistive device: Rolling walker (2 wheeled);None Gait Pattern/deviations: Step-through pattern;Decreased stride length;Trunk flexed Gait velocity: good speed Gait velocity interpretation: 1.31 - 2.62 ft/sec, indicative of limited community ambulator General Gait Details: MOstly steady gait with RW initially with cues for RW proximity and navigation esp for turns progressing to no DME (does not use at home) and close min guard. No DOE> SP02 remained >95% on RA throughout. HR up to 123 bpm max with activity.   Stairs             Wheelchair Mobility    Modified Rankin (Stroke Patients Only)       Balance Overall balance assessment: Needs assistance Sitting-balance support: Feet supported;No upper extremity supported Sitting balance-Leahy Scale: Good     Standing balance support: During functional activity Standing balance-Leahy Scale: Poor Standing balance comment: UE support initially progressing to no UE support for static standing.                            Cognition Arousal/Alertness: Awake/alert Behavior During Therapy: WFL for tasks assessed/performed Overall Cognitive Status: Within Functional Limits for tasks assessed                                        Exercises      General Comments General comments (skin integrity, edema, etc.): HR ranged from 80s-123 bpm with activity. Orthostatics- supine BP 107/84  HR 84 bpm, sitting BP 90/66 HR 98 bpm, standing BP 99/87, HR 102 bpm, sitting post walk BP 91/64, asymptomatic. Sp02 remained >95% on RA throughout session so removed 02. RN made aware.      Pertinent Vitals/Pain Pain Assessment: No/denies pain    Home Living                      Prior Function            PT Goals (current goals can now be found in the care plan section) Progress towards PT goals: Progressing toward goals    Frequency    Min  3X/week      PT Plan Current plan remains appropriate    Co-evaluation              AM-PAC PT "6 Clicks" Mobility   Outcome Measure  Help needed turning from your back to your side while in a flat bed without using bedrails?: A Little Help needed moving from lying on your back to sitting on the side of a flat bed without using bedrails?: A Little Help needed moving to and from a bed to a chair (including a wheelchair)?: A Little Help needed standing up from a chair using your arms (e.g., wheelchair or bedside chair)?: A Little Help needed to walk in hospital room?: A Little Help needed climbing 3-5 steps with a railing? : A Little 6 Click Score: 18    End of Session Equipment Utilized During Treatment: Gait belt Activity Tolerance: Patient tolerated treatment well Patient left: in chair;with call bell/phone within reach;with chair alarm set Nurse Communication: Mobility status;Other (comment) (BP.02) PT Visit Diagnosis: Unsteadiness on feet (R26.81);Muscle weakness (generalized) (M62.81)     Time: 9892-1194 PT Time Calculation (min) (ACUTE ONLY): 21 min  Charges:  $Gait Training: 8-22 mins                     Marisa Severin, PT, DPT Acute Rehabilitation Services Pager [phone] Office [place] 09/17/2020, 8:41 AM

## 2020-09-17 NOTE — Progress Notes (Signed)
OT Cancellation Note  Patient Details Name: David ZUERCHER Sr. MRN: [id number] DOB: 1926/12/26   Cancelled Treatment:    Reason Eval/Treat Not Completed: Fatigue/lethargy limiting ability to participate;Other (comment) pt asleep upon arrival,will check back as time allows.  Corinne Ports K., COTA/L Acute Rehabilitation Services [phone]   [name] 09/17/2020, 1:46 PM

## 2020-09-18 DIAGNOSIS — E440 Moderate protein-calorie malnutrition: Secondary | ICD-10-CM | POA: Diagnosis not present

## 2020-09-18 DIAGNOSIS — K7460 Unspecified cirrhosis of liver: Secondary | ICD-10-CM | POA: Diagnosis not present

## 2020-09-18 DIAGNOSIS — N179 Acute kidney failure, unspecified: Secondary | ICD-10-CM | POA: Diagnosis not present

## 2020-09-18 DIAGNOSIS — L97511 Non-pressure chronic ulcer of other part of right foot limited to breakdown of skin: Secondary | ICD-10-CM | POA: Diagnosis not present

## 2020-09-18 LAB — SARS CORONAVIRUS 2 (TAT 6-24 HRS): SARS Coronavirus 2: NEGATIVE

## 2020-09-18 MED ORDER — MIDODRINE HCL 5 MG PO TABS: 5.0000 mg | ORAL_TABLET | Freq: Two times a day (BID) | ORAL | Status: DC

## 2020-09-18 NOTE — Discharge Summary (Signed)
Physician Discharge Summary  David CULLENS Sr. CZY:[id number] DOB: 17-Jun-1926 DOA: 09/13/2020  PCP: Pleas Koch, NP  Admit date: 09/13/2020 Discharge date: 09/18/2020  Admitted From: Home Disposition: Skilled nursing facility  Recommendations for Outpatient Follow-up:  1. Follow up with PCP in 1-2 weeks 2. Please obtain CMP/CBC in one week  Home Health: Not applicable Equipment/Devices: Not applicable  Discharge Condition: Stable CODE STATUS: DNR Diet recommendation: Low-salt diet  Discharge summary: 87 year old gentleman with history of stroke, coronary artery disease, MASH cirrhosis and recently diagnosed hepatocellular carcinoma on surveillance presented to the hospital with progressive shortness of breath, weakness and fall.  Patient also complaining of bloating up for last few months.  More than 10 pound weight gain in 1 month.  Difficulty walking around.  Lower extremity edema.  Lost balance and fell back when he was trying to get to the car.  Recent echocardiogram with ejection fraction 55%.   Assessment & Plan of care:   # Cirrhosis of liver with ascites/decompensated liver cirrhosis.  Known hepatocellular carcinoma: Patient with difficult to control symptoms at home with conservative management and oral diuresis. Patient was admitted to hospital, challenged with high dose of diuretics, significant orthostatic drop in blood pressures and dizziness.   Patient is not tolerating escalating dose of diuresis.   We will continue on Aldactone 50 mg daily, Lasix 20 mg daily.   Added midodrine 5 mg 3 times a day, will decrease to 2 times a day with improvement in orthostatic symptoms to work with rehab.   Paracentesis, 3.4 L fluid removed, unfortunately labs were not ordered. Seen by gastroenterology.  No new recommendation at this time.  He has ERCP planned as outpatient. Mobility. Continue to mobilize with PT OT.  Recommended skilled nursing facility.  # Acute kidney  injury on chronic kidney disease stage IIIb. Remains stable today.  Monitor output.  He is not a candidate for hemodialysis.  # Right leg wound: Seen by wound care.  Local wound care.  # Debility/frailty: Work with PT OT.  Lives alone at home.  Followed by palliative care team.  DNR with limited intervention. Refer to skilled nursing facility for inpatient therapies before returning home.  Patient is medically stable to transfer to skilled level of care.   Discharge Diagnoses:  Principal Problem:   Cirrhosis of liver with ascites (HCC) Active Problems:   Acute congestive heart failure (HCC)   Acute kidney injury superimposed on CKD (HCC)   Foot ulcer, right, limited to breakdown of skin (HCC)   Class 1 obesity due to excess calories with body mass index (BMI) of 32.0 to 32.9 in adult   DNR (do not resuscitate)   Malnutrition of moderate degree    Discharge Instructions  Discharge Instructions    Call MD for:  difficulty breathing, headache or visual disturbances   Complete by: As directed    Call MD for:  persistant dizziness or light-headedness   Complete by: As directed    Diet - low sodium heart healthy   Complete by: As directed    Discharge wound care:   Complete by: As directed    Wash wound on right lateral foot close to the fifth toe with soap and water. Pat dry. Place a small foam dressing over it. Evaluate the area each day. Change the dressing every 3 days and prn.   Increase activity slowly   Complete by: As directed      Allergies as of 09/18/2020  Reactions   Tape Other (See Comments)   SKIN IS VERY THIN- WILL TEAR EASILY!! PLEASE USE PAPER TAPE   Atorvastatin Rash, Other (See Comments)   Patient started on Plavix and Lipitor at the same time and developed a rash   Brilinta [ticagrelor] Hives   Plavix [clopidogrel Bisulfate] Rash, Other (See Comments)   Patient started on plavix and lipitor at the same time and developed a rash      Medication List     STOP taking these medications   aspirin EC 325 MG tablet     TAKE these medications   furosemide 20 MG tablet Commonly known as: LASIX Take 1 tablet (20 mg total) by mouth daily. For leg swelling. What changed:   when to take this  additional instructions   ICAPS AREDS FORMULA PO Take 1 capsule by mouth 2 (two) times daily.   midodrine 5 MG tablet Commonly known as: PROAMATINE Take 1 tablet (5 mg total) by mouth 2 (two) times daily with a meal.   omeprazole 40 MG capsule Commonly known as: PRILOSEC Take 1 capsule (40 mg total) by mouth 2 (two) times daily before a meal.   spironolactone 50 MG tablet Commonly known as: ALDACTONE Take 1 tablet (50 mg total) by mouth daily. What changed: when to take this            Discharge Care Instructions  (From admission, onward)         Start     Ordered   09/18/20 0000  Discharge wound care:       Comments: Wash wound on right lateral foot close to the fifth toe with soap and water. Pat dry. Place a small foam dressing over it. Evaluate the area each day. Change the dressing every 3 days and prn.   09/18/20 1018          Follow-up Information    Pleas Koch, NP Follow up in 2 week(s).   Specialty: Internal Medicine Contact information: [place] [address] [phone]              Allergies  Allergen Reactions  . Tape Other (See Comments)    SKIN IS VERY THIN- WILL TEAR EASILY!! PLEASE USE PAPER TAPE  . Atorvastatin Rash and Other (See Comments)    Patient started on Plavix and Lipitor at the same time and developed a rash  . Brilinta [Ticagrelor] Hives  . Plavix [Clopidogrel Bisulfate] Rash and Other (See Comments)    Patient started on plavix and lipitor at the same time and developed a rash    Consultations:  Gastroenterology   Procedures/Studies: CT Head Wo Contrast  Result Date: 09/13/2020 CLINICAL DATA:  Pain following fall EXAM: CT HEAD WITHOUT CONTRAST  TECHNIQUE: Contiguous axial images were obtained from the base of the skull through the vertex without intravenous contrast. COMPARISON:  January 30, 2016 head CT and brain MRI. FINDINGS: Brain: Moderate diffuse atrophy is stable. There is no intracranial mass, hemorrhage, extra-axial fluid collection, or midline shift. There is evidence of a prior small infarct in the posterior mid left cerebellum, stable. There is evidence of a prior infarct in the right mid parietal lobe posteriorly, stable. There is decreased attenuation in periventricular white matter consistent with periventricular small vessel disease. No acute infarct is evident. Vascular: No hyperdense vessel. Calcification noted in each carotid siphon region. Skull: Bony calvarium appears intact. Stable 7 mm left frontal dural calcification, a likely small enostosis. No surrounding  edema. This is a finding of no clinical significance. Sinuses/Orbits: There is slight mucosal thickening in several ethmoid air cells. Other visualized paranasal sinuses are clear. Orbits appear symmetric bilaterally. Other: Mastoid air cells are clear. IMPRESSION: Atrophy with periventricular small vessel disease. Prior infarct in the mid to posterior right parietal lobe and in the posterior mid left cerebellum. No acute infarct appreciable. No mass or hemorrhage. There are foci of arterial vascular calcification. There is mucosal thickening in several ethmoid air cells. Electronically Signed   By: Lowella Grip III M.D.   On: 09/13/2020 15:11   US Abdomen Limited  Result Date: 09/14/2020 CLINICAL DATA:  87 year old with abdominal ascites. History of hepatocellular carcinoma. EXAM: ULTRASOUND ABDOMEN LIMITED RIGHT UPPER QUADRANT COMPARISON:  MRI 07/05/2020 FINDINGS: Gallbladder: Not visualized. Common bile duct: Diameter: 4 mm Liver: Liver is severely nodular and compatible with cirrhosis. Liver parenchyma is mildly heterogeneous. Large amount of perihepatic ascites.  Patient has a known right hepatic lesion that is not clearly identified on this examination. Portal vein is patent on color Doppler imaging with normal direction of blood flow towards the liver. Other: Minimal fluid in the right lower quadrant. Large amount of ascites in the left upper quadrant. Large amount of ascites in left lower quadrant. IMPRESSION: 1. Cirrhotic liver with a large amount of ascites. 2. Gallbladder is not visualized. 3. Known hepatic lesion is not identified on this examination. Electronically Signed   By: Markus Daft M.D.   On: 09/14/2020 16:54   DG Chest Port 1 View  Result Date: 09/13/2020 CLINICAL DATA:  Shortness of breath. EXAM: PORTABLE CHEST 1 VIEW COMPARISON:  Chest x-ray dated June 06, 2020. FINDINGS: The heart size and mediastinal contours are within normal limits. Normal pulmonary vascularity. Emphysematous changes again noted. Increasing small left pleural effusion with left basilar opacity. Unchanged scarring at the right lung base. No pneumothorax. No acute osseous abnormality. IMPRESSION: 1. Increasing small left pleural effusion with left basilar atelectasis versus infiltrate. Electronically Signed   By: Titus Dubin M.D.   On: 09/13/2020 13:56   ECHOCARDIOGRAM COMPLETE  Result Date: 08/27/2020    ECHOCARDIOGRAM REPORT   Patient Name:   JOREN REHM Sr. Date of Exam: 08/27/2020 Medical Rec #:  [id number]           Height:       60.0 in Accession #:    [id number]          Weight:       164.0 lb Date of Birth:  Sep 03, 1926          BSA:          1.716 m Patient Age:    55 years            BP:           110/60 mmHg Patient Gender: M                   HR:           99 bpm. Exam Location:  [address] Procedure: 2D Echo, Cardiac Doppler and Color Doppler Indications:    R06.00 Dyspnea  History:        Patient has prior history of Echocardiogram examinations, most                 recent 01/31/2016. Stroke. Edema. Myocardial Infarction.  Sonographer:    Cresenciano Lick RDCS Referring Phys: [id number] [place]  1. Limited study due to poor  acoustic windows and lack of definity contrast. Left ventricular ejection fraction, by estimation, is 50 to 55%. The left ventricle has low normal function. The left ventricle has no regional wall motion abnormalities. There  is mild concentric left ventricular hypertrophy. Left ventricular diastolic parameters are indeterminate.  2. Right ventricular systolic function is normal. The right ventricular size is normal.  3. The mitral valve is degenerative. Trivial mitral valve regurgitation.  4. The aortic valve is not well visualized, however, there appears to be mild-to-moderate aortic stenosis base on doppler interrogation with AVA 1.3cm2, mean gradient 36mmHg, peak gradient 38mmHg, Vmax 2.79m/s. Notably the LVOT VTI is an incomplete signal and therefore the AVA may underestimate true valve area.  5. There is mild aortic regurgitation. Comparison(s): Compared to prior echo report in 2017, the LVEF now appears to be 50-55%. Degree of aortic stenosis is unchanged (previous mean gradient in 2017 was 42mmHg). FINDINGS  Left Ventricle: Limited study due to poor acoustic windows and lack of definity contrast. Left ventricular ejection fraction, by estimation, is 50 to 55%. The left ventricle has low normal function. The left ventricle has no regional wall motion abnormalities. The left ventricular internal cavity size was normal in size. There is mild concentric left ventricular hypertrophy. Left ventricular diastolic parameters are indeterminate. Right Ventricle: The right ventricular size is normal. No increase in right ventricular wall thickness. Right ventricular systolic function is normal. Left Atrium: Left atrial size was normal in size. Right Atrium: Right atrial size was normal in size. Pericardium: There is no evidence of pericardial effusion. Mitral Valve: The mitral valve is degenerative in appearance. There  is mild thickening of the mitral valve leaflet(s). There is mild calcification of the mitral valve leaflet(s). Mild to moderate mitral annular calcification. Trivial mitral valve regurgitation. Tricuspid Valve: The tricuspid valve is normal in structure. Tricuspid valve regurgitation is trivial. Aortic Valve: Aortic valve is not well visualized, however, there appears to be mild-to-moderate aortic stenosis with AVA 1.1cm2, mean gradient 59mmHg, peak gradient 13mmHg, Vmax 2.59m/s. Notably the LVOT VTI is an incomplete signal and therefore the AVA may underestimate true valve area. The aortic valve was not well visualized. Aortic valve regurgitation is mild. Aortic regurgitation PHT measures 275 msec. Mild to moderate aortic stenosis is present. Aortic valve mean gradient measures 18.0 mmHg. Aortic valve peak gradient measures 28.0 mmHg. Aortic valve area, by VTI measures 1.35 cm. Pulmonic Valve: The pulmonic valve was not well visualized. Pulmonic valve regurgitation is not visualized. Aorta: The aortic root is normal in size and structure. IAS/Shunts: No atrial level shunt detected by color flow Doppler.  LEFT VENTRICLE PLAX 2D LVIDd:         4.50 cm  Diastology LVIDs:         3.50 cm  LV e' medial:    3.48 cm/s LV PW:         1.00 cm  LV E/e' medial:  12.4 LV IVS:        1.00 cm  LV e' lateral:   4.03 cm/s LVOT diam:     2.20 cm  LV E/e' lateral: 10.7 LV SV:         61 LV SV Index:   36 LVOT Area:     3.80 cm  RIGHT VENTRICLE RV Basal diam:  2.90 cm RV S prime:     9.70 cm/s TAPSE (M-mode): 2.1 cm LEFT ATRIUM             Index  RIGHT ATRIUM          Index LA diam:        4.00 cm 2.33 cm/m  RA Area:     7.03 cm LA Vol (A2C):   24.4 ml 14.22 ml/m RA Volume:   11.80 ml 6.88 ml/m LA Vol (A4C):   19.2 ml 11.19 ml/m LA Biplane Vol: 23.0 ml 13.41 ml/m  AORTIC VALVE AV Area (Vmax):    0.82 cm AV Area (Vmean):   0.86 cm AV Area (VTI):     1.35 cm AV Vmax:           264.50 cm/s AV Vmean:          203.750 cm/s  AV VTI:            0.452 m AV Peak Grad:      28.0 mmHg AV Mean Grad:      18.0 mmHg LVOT Vmax:         57.05 cm/s LVOT Vmean:        45.950 cm/s LVOT VTI:          0.161 m LVOT/AV VTI ratio: 0.36 AI PHT:            275 msec  AORTA Ao Root diam: 3.50 cm MV E velocity: 43.20 cm/s MV A velocity: 110.00 cm/s  SHUNTS MV E/A ratio:  0.39         Systemic VTI:  0.16 m                             Systemic Diam: 2.20 cm Gwyndolyn Kaufman MD Electronically signed by Gwyndolyn Kaufman MD Signature Date/Time: 08/27/2020/4:22:05 PM    Final    IR Paracentesis  Result Date: 09/14/2020 INDICATION: Patient with history of cirrhosis found to have ascites. Request is made for therapeutic paracentesis. EXAM: ULTRASOUND GUIDED THERAPEUTIC PARACENTESIS MEDICATIONS: 10 mL 1% lidocaine COMPLICATIONS: None immediate. PROCEDURE: Informed written consent was obtained from the patient after a discussion of the risks, benefits and alternatives to treatment. A timeout was performed prior to the initiation of the procedure. Initial ultrasound scanning demonstrates a moderate amount of ascites within the left lateral abdomen. The left lateral abdomen was prepped and draped in the usual sterile fashion. 1% lidocaine was used for local anesthesia. Following this, a 19 gauge, 7-cm, Yueh catheter was introduced. An ultrasound image was saved for documentation purposes. The paracentesis was performed. The catheter was removed and a dressing was applied. The patient tolerated the procedure well without immediate post procedural complication. FINDINGS: A total of approximately 3.4 liters of yellow fluid was removed. Procedure was stopped prior to removal of fluid due to patient developing asymptomatic hypotension. IMPRESSION: Successful ultrasound-guided paracentesis yielding 3.4 liters of peritoneal fluid. Read by: Brynda Greathouse PA-C Electronically Signed   By: Jacqulynn Cadet M.D.   On: 09/14/2020 17:18   (Echo, Carotid, EGD, Colonoscopy,  ERCP)    Subjective: Patient seen and examined.  No overnight events.  He denies any complaints.  Still has some leg swelling.  He ambulated around with no orthostatic symptoms.  Blood pressure remained more than 90 after starting on midodrine.  Denies any chest pain or shortness of breath.  Denies any abdominal bloating.   Discharge Exam: Vitals:   09/17/20 2057 09/18/20 0525  BP: 97/69 97/63  Pulse: 94 85  Resp: 18 18  Temp: 98.4 F (36.9 C) 98.3 F (36.8 C)  SpO2: 90% 90%  Vitals:   09/17/20 1021 09/17/20 1107 09/17/20 2057 09/18/20 0525  BP:  (!) 89/67 97/69 97/63   Pulse:  90 94 85  Resp:  17 18 18   Temp:  98.2 F (36.8 C) 98.4 F (36.9 C) 98.3 F (36.8 C)  TempSrc:   Oral Oral  SpO2: 97% 90% 90% 90%  Weight:    71.9 kg  Height:        General: Pt is alert, awake, not in acute distress Is frail and debilitated.  Looks age-appropriate.  Not in any distress.  Is on room air. Cardiovascular: RRR, S1/S2 +, no rubs, no gallops Respiratory: CTA bilaterally, no wheezing, no rhonchi Abdominal: Soft, NT, ND, bowel sounds + Extremities: Has 1+ edema both legs.  No cyanosis.    The results of significant diagnostics from this hospitalization (including imaging, microbiology, ancillary and laboratory) are listed below for reference.     Microbiology: Recent Results (from the past 240 hour(s))  Resp Panel by RT-PCR (Flu A&B, Covid) Nasopharyngeal Swab     Status: None   Collection Time: 09/13/20  1:33 PM   Specimen: Nasopharyngeal Swab; Nasopharyngeal(NP) swabs in vial transport medium  Result Value Ref Range Status   SARS Coronavirus 2 by RT PCR NEGATIVE NEGATIVE Final    Comment: (NOTE) SARS-CoV-2 target nucleic acids are NOT DETECTED.  The SARS-CoV-2 RNA is generally detectable in upper respiratory specimens during the acute phase of infection. The lowest concentration of SARS-CoV-2 viral copies this assay can detect is 138 copies/mL. A negative result does not  preclude SARS-Cov-2 infection and should not be used as the sole basis for treatment or other patient management decisions. A negative result may occur with  improper specimen collection/handling, submission of specimen other than nasopharyngeal swab, presence of viral mutation(s) within the areas targeted by this assay, and inadequate number of viral copies(<138 copies/mL). A negative result must be combined with clinical observations, patient history, and epidemiological information. The expected result is Negative.  Fact Sheet for Patients:  EntrepreneurPulse.com.au  Fact Sheet for Healthcare Providers:  IncredibleEmployment.be  This test is no t yet approved or cleared by the [place] FDA and  has been authorized for detection and/or diagnosis of SARS-CoV-2 by FDA under an Emergency Use Authorization (EUA). This EUA will remain  in effect (meaning this test can be used) for the duration of the COVID-19 declaration under Section 564(b)(1) of the Act, 21 U.S.C.section 360bbb-3(b)(1), unless the authorization is terminated  or revoked sooner.       Influenza A by PCR NEGATIVE NEGATIVE Final   Influenza B by PCR NEGATIVE NEGATIVE Final    Comment: (NOTE) The Xpert Xpress SARS-CoV-2/FLU/RSV plus assay is intended as an aid in the diagnosis of influenza from Nasopharyngeal swab specimens and should not be used as a sole basis for treatment. Nasal washings and aspirates are unacceptable for Xpert Xpress SARS-CoV-2/FLU/RSV testing.  Fact Sheet for Patients: EntrepreneurPulse.com.au  Fact Sheet for Healthcare Providers: IncredibleEmployment.be  This test is not yet approved or cleared by the [place] FDA and has been authorized for detection and/or diagnosis of SARS-CoV-2 by FDA under an Emergency Use Authorization (EUA). This EUA will remain in effect (meaning this test can be used) for the  duration of the COVID-19 declaration under Section 564(b)(1) of the Act, 21 U.S.C. section 360bbb-3(b)(1), unless the authorization is terminated or revoked.  Performed at [facility], [place] [address] [place], [place] [address]      Labs: BNP (last 3 results)  Recent Labs    09/13/20 1323  BNP 093.8*   Basic Metabolic Panel: Recent Labs  Lab 09/13/20 1323 09/13/20 1340 09/14/20 0426 09/16/20 0327  NA 141 142 140 138  K 4.6 4.6 4.0 4.3  CL 105 106 105 103  CO2 27  --  28 27  GLUCOSE 124* 122* 90 110*  BUN 21 26* 22 31*  CREATININE 1.97* 1.70* 1.84* 1.59*  CALCIUM 8.3*  --  8.1* 7.9*  MG  --   --   --  2.1  PHOS  --   --   --  2.8   Liver Function Tests: Recent Labs  Lab 09/13/20 1323 09/14/20 0426 09/16/20 0327  AST 49* 32 33  ALT 30 25 23   ALKPHOS 395* 306* 248*  BILITOT 1.6* 1.6* 1.1  PROT 6.2* 5.3* 5.0*  ALBUMIN 2.5* 2.1* 2.2*   No results for input(s): LIPASE, AMYLASE in the last 168 hours. No results for input(s): AMMONIA in the last 168 hours. CBC: Recent Labs  Lab 09/13/20 1323 09/13/20 1340 09/14/20 0426 09/16/20 0327  WBC 10.8*  --  9.1 8.9  NEUTROABS 7.4  --   --  5.7  HGB 17.2* 17.3* 15.0 15.2  HCT 52.5* 51.0 44.9 45.3  MCV 99.1  --  95.7 95.2  PLT 191  --  175 144*   Cardiac Enzymes: No results for input(s): CKTOTAL, CKMB, CKMBINDEX, TROPONINI in the last 168 hours. BNP: Invalid input(s): POCBNP CBG: No results for input(s): GLUCAP in the last 168 hours. D-Dimer No results for input(s): DDIMER in the last 72 hours. Hgb A1c No results for input(s): HGBA1C in the last 72 hours. Lipid Profile No results for input(s): CHOL, HDL, LDLCALC, TRIG, CHOLHDL, LDLDIRECT in the last 72 hours. Thyroid function studies No results for input(s): TSH, T4TOTAL, T3FREE, THYROIDAB in the last 72 hours.  Invalid input(s): FREET3 Anemia work up No results for input(s): VITAMINB12, FOLATE, FERRITIN, TIBC, IRON, RETICCTPCT in the last 72  hours. Urinalysis    Component Value Date/Time   COLORURINE YELLOW 02/14/2016 2143   APPEARANCEUR HAZY (A) 02/14/2016 2143   LABSPEC 1.011 02/14/2016 2143   PHURINE 5.5 02/14/2016 2143   GLUCOSEU NEGATIVE 02/14/2016 2143   HGBUR SMALL (A) 02/14/2016 2143   BILIRUBINUR NEGATIVE 02/14/2016 2143   KETONESUR NEGATIVE 02/14/2016 2143   PROTEINUR NEGATIVE 02/14/2016 2143   UROBILINOGEN 1.0 03/21/2007 0100   NITRITE NEGATIVE 02/14/2016 2143   LEUKOCYTESUR NEGATIVE 02/14/2016 2143   Sepsis Labs Invalid input(s): PROCALCITONIN,  WBC,  LACTICIDVEN Microbiology Recent Results (from the past 240 hour(s))  Resp Panel by RT-PCR (Flu A&B, Covid) Nasopharyngeal Swab     Status: None   Collection Time: 09/13/20  1:33 PM   Specimen: Nasopharyngeal Swab; Nasopharyngeal(NP) swabs in vial transport medium  Result Value Ref Range Status   SARS Coronavirus 2 by RT PCR NEGATIVE NEGATIVE Final    Comment: (NOTE) SARS-CoV-2 target nucleic acids are NOT DETECTED.  The SARS-CoV-2 RNA is generally detectable in upper respiratory specimens during the acute phase of infection. The lowest concentration of SARS-CoV-2 viral copies this assay can detect is 138 copies/mL. A negative result does not preclude SARS-Cov-2 infection and should not be used as the sole basis for treatment or other patient management decisions. A negative result may occur with  improper specimen collection/handling, submission of specimen other than nasopharyngeal swab, presence of viral mutation(s) within the areas targeted by this assay, and inadequate number of viral copies(<138 copies/mL). A negative result must be  combined with clinical observations, patient history, and epidemiological information. The expected result is Negative.  Fact Sheet for Patients:  EntrepreneurPulse.com.au  Fact Sheet for Healthcare Providers:  IncredibleEmployment.be  This test is no t yet approved or cleared by  the [place] FDA and  has been authorized for detection and/or diagnosis of SARS-CoV-2 by FDA under an Emergency Use Authorization (EUA). This EUA will remain  in effect (meaning this test can be used) for the duration of the COVID-19 declaration under Section 564(b)(1) of the Act, 21 U.S.C.section 360bbb-3(b)(1), unless the authorization is terminated  or revoked sooner.       Influenza A by PCR NEGATIVE NEGATIVE Final   Influenza B by PCR NEGATIVE NEGATIVE Final    Comment: (NOTE) The Xpert Xpress SARS-CoV-2/FLU/RSV plus assay is intended as an aid in the diagnosis of influenza from Nasopharyngeal swab specimens and should not be used as a sole basis for treatment. Nasal washings and aspirates are unacceptable for Xpert Xpress SARS-CoV-2/FLU/RSV testing.  Fact Sheet for Patients: EntrepreneurPulse.com.au  Fact Sheet for Healthcare Providers: IncredibleEmployment.be  This test is not yet approved or cleared by the [place] FDA and has been authorized for detection and/or diagnosis of SARS-CoV-2 by FDA under an Emergency Use Authorization (EUA). This EUA will remain in effect (meaning this test can be used) for the duration of the COVID-19 declaration under Section 564(b)(1) of the Act, 21 U.S.C. section 360bbb-3(b)(1), unless the authorization is terminated or revoked.  Performed at [facility], [place] [address], [place], [place] [address]      Time coordinating discharge:  35 minutes  SIGNED:   Barb Merino, MD  [facility] 09/18/2020, 10:25 AM

## 2020-09-18 NOTE — TOC Progression Note (Addendum)
Transition of Care (TOC) - Progression Note    Patient Details  Name: David Bradshaw Sr. MRN: [id number] Date of Birth: [birth year]  Transition of Care [facility]) CM/SW Contact  Reece Agar, [place] Phone Number: 09/18/2020, 1:08 PM  Clinical Narrative:    1:08pm- CSW contacted pt son, he has agreed with [facility] in [place]. CSW contacted Jackelyn Poling at [facility] ([phone]) she is currently reviewing this pt and will contact CSW with decision.  9:83JA- Debbie with [facility] ([phone]) will take this pt, she is working on his insurance auth and wants a copy of pts covid vaccinations. CSW will follow up as needed.   Expected Discharge Plan: Skilled Nursing Facility Barriers to Discharge: Continued Medical Work up,Insurance Authorization,SNF Pending bed offer  Expected Discharge Plan and Services Expected Discharge Plan: [place] arrangements for the past 2 months: Single Family Home Expected Discharge Date: 09/18/20                                     Social Determinants of Health (SDOH) Interventions    Readmission Risk Interventions No flowsheet data found.

## 2020-09-18 NOTE — Progress Notes (Signed)
Physical Therapy Treatment Patient Details Name: David FRIEDEL Sr. MRN: [id number] DOB: 15-Jul-1926 Today's Date: 09/18/2020    History of Present Illness 87 y.o. male presents to [facility] ED on 09/13/2020 with SOB and fall. Pt admitted with ascites, volume overload, and R foot wounds. Past medical history significant of CVA; CAD; cirrhosis with hepatocellular CA; and carotid and aortic stenosis.    PT Comments    Patient progressing well towards PT goals. Reports no dizziness during session today. Improved ambulation distance without use of DME with Min A for balance/safety. Noted to have 2/4 DOE, HR up to 116 bpm max wtih activity and sp02 dropped to 87% on RA with walking, rebounds quickly within 1-2 mins. Requires Min A for bed mobility and Min guard for standing. Eager to d/c today. Discharge recommendation updated to SNF as pt does not have support at home. Will follow.   Follow Up Recommendations  SNF;Supervision for mobility/OOB     Equipment Recommendations  None recommended by PT    Recommendations for Other Services       Precautions / Restrictions Precautions Precautions: Fall;Other (comment) Precaution Comments: watch BP-low, 02 Restrictions Weight Bearing Restrictions: No    Mobility  Bed Mobility Overal bed mobility: Needs Assistance Bed Mobility: Supine to Sit     Supine to sit: Min assist;HOB elevated     General bed mobility comments: "pull me up" assist with trunk to get to EOB, no dizziness.    Transfers Overall transfer level: Needs assistance Equipment used: Rolling walker (2 wheeled) Transfers: Sit to/from Stand Sit to Stand: Min guard         General transfer comment: min guard for safety. Stood from [employer]. SLow to rise. No dizziness.  Ambulation/Gait Ambulation/Gait assistance: Min assist Gait Distance (Feet): 120 Feet Assistive device: None Gait Pattern/deviations: Step-through pattern;Decreased stride length;Trunk flexed;Staggering  left;Staggering right;Narrow base of support Gait velocity: good speed Gait velocity interpretation: 1.31 - 2.62 ft/sec, indicative of limited community ambulator General Gait Details: Slow, unsteady gait with staggering in both directions and narrow BoS. 2/4 DOE. Sp02 dropped to 87% on RA, rebounds within 1-2 mins.  HR up to 116 bpm.   Stairs             Wheelchair Mobility    Modified Rankin (Stroke Patients Only)       Balance Overall balance assessment: Needs assistance Sitting-balance support: Feet supported;No upper extremity supported Sitting balance-Leahy Scale: Good     Standing balance support: During functional activity Standing balance-Leahy Scale: Fair Standing balance comment: Requires external support for walking due to imbalance, able to stand statically without support.                            Cognition Arousal/Alertness: Awake/alert Behavior During Therapy: WFL for tasks assessed/performed Overall Cognitive Status: Within Functional Limits for tasks assessed                                        Exercises      General Comments General comments (skin integrity, edema, etc.): HR up to 116 bpm max wtih activity. SP02 dropped to 87% on RA with walking, rebounds quickly within 1-2 mins      Pertinent Vitals/Pain Pain Assessment: No/denies pain    Home Living  Prior Function            PT Goals (current goals can now be found in the care plan section) Progress towards PT goals: Progressing toward goals    Frequency    Min 2X/week      PT Plan Discharge plan needs to be updated;Frequency needs to be updated    Co-evaluation              AM-PAC PT "6 Clicks" Mobility   Outcome Measure  Help needed turning from your back to your side while in a flat bed without using bedrails?: A Little Help needed moving from lying on your back to sitting on the side of a flat bed  without using bedrails?: A Little Help needed moving to and from a bed to a chair (including a wheelchair)?: A Little Help needed standing up from a chair using your arms (e.g., wheelchair or bedside chair)?: A Little Help needed to walk in hospital room?: A Little Help needed climbing 3-5 steps with a railing? : A Little 6 Click Score: 18    End of Session Equipment Utilized During Treatment: Gait belt Activity Tolerance: Treatment limited secondary to medical complications (Comment) (drop in Sp02) Patient left: in bed;with call bell/phone within reach;with bed alarm set Nurse Communication: Mobility status PT Visit Diagnosis: Unsteadiness on feet (R26.81);Muscle weakness (generalized) (M62.81)     Time: 5859-2924 PT Time Calculation (min) (ACUTE ONLY): 13 min  Charges:  $Gait Training: 8-22 mins                     Marisa Severin, PT, DPT Acute Rehabilitation Services Pager [phone] Office [place] 09/18/2020, 11:42 AM

## 2020-09-18 NOTE — Progress Notes (Addendum)
Occupational Therapy Treatment Patient Details Name: David Bradshaw. MRN: [id number] DOB: [birth year] Today's Date: 09/18/2020    History of present illness 87 y.o. male presents to [facility] ED on 09/13/2020 with SOB and fall. Pt admitted with ascites, volume overload, and R foot wounds. Past medical history significant of CVA; CAD; cirrhosis with hepatocellular CA; and carotid and aortic stenosis.   OT comments  Pt making progress with functional goals. Pt very pleasant and cooperative. OT will continue to follow acutely to maximize level of function and safety  Follow Up Recommendations  SNF    Equipment Recommendations  Tub/shower seat    Recommendations for Other Services      Precautions / Restrictions Precautions Precautions: Fall;Other (comment) Precaution Comments: watch BP-low, 02 Restrictions Weight Bearing Restrictions: No Other Position/Activity Restrictions: Male pure wick       Mobility Bed Mobility Overal bed mobility: Needs Assistance Bed Mobility: Supine to Sit;Sit to Supine     Supine to sit: Min guard Sit to supine: Min guard   General bed mobility comments: "pull me up" assist with trunk to get to EOB, no dizziness.    Transfers Overall transfer level: Needs assistance Equipment used: Rolling walker (2 wheeled) Transfers: Sit to/from Stand Sit to Stand: Min guard         General transfer comment: min guard for safety. Stood from [employer]. SLow to rise. No dizziness.    Balance Overall balance assessment: Needs assistance Sitting-balance support: Feet supported;No upper extremity supported Sitting balance-Leahy Scale: Good     Standing balance support: During functional activity;Bilateral upper extremity supported Standing balance-Leahy Scale: Fair Standing balance comment: Requires external support for walking due to imbalance, able to stand statically without support.                           ADL either performed or assessed  with clinical judgement   ADL Overall ADL's : Needs assistance/impaired     Grooming: Wash/dry hands;Wash/dry face;Min guard;Standing       Lower Body Bathing: Sitting/lateral leans;Sit to/from stand;Minimal assistance Lower Body Bathing Details (indicate cue type and reason): simulated sitting EOB, standing     Lower Body Dressing: Minimal assistance Lower Body Dressing Details (indicate cue type and reason): min A to don socks seated EOB Toilet Transfer: Min guard;Ambulation;Stand-pivot;Comfort height toilet;BSC;Grab bars;Cueing for safety   Toileting- Clothing Manipulation and Hygiene: Minimal assistance;Sit to/from stand       Functional mobility during ADLs: Minimal assistance;Min guard;Cueing for safety       Vision Baseline Vision/History: Macular Degeneration Patient Visual Report: No change from baseline     Perception     Praxis      Cognition Arousal/Alertness: Awake/alert Behavior During Therapy: WFL for tasks assessed/performed Overall Cognitive Status: Within Functional Limits for tasks assessed                                          Exercises     Shoulder Instructions       General Comments HR up to 116 bpm max wtih activity. SP02 dropped to 87% on RA with walking, rebounds quickly within 1-2 mins    Pertinent Vitals/ Pain       Pain Assessment: No/denies pain  Home Living  Prior Functioning/Environment              Frequency  Min 2X/week        Progress Toward Goals  OT Goals(current goals can now be found in the care plan section)  Progress towards OT goals: Progressing toward goals  Acute Rehab OT Goals Patient Stated Goal: Goal is to return home  Plan Discharge plan remains appropriate    Co-evaluation                 AM-PAC OT "6 Clicks" Daily Activity     Outcome Measure   Help from another person eating meals?: None Help from  another person taking care of personal grooming?: A Little Help from another person toileting, which includes using toliet, bedpan, or urinal?: A Little Help from another person bathing (including washing, rinsing, drying)?: A Little Help from another person to put on and taking off regular upper body clothing?: None Help from another person to put on and taking off regular lower body clothing?: A Little 6 Click Score: 20    End of Session Equipment Utilized During Treatment: Rolling walker;Gait belt;Other (comment) (BSC)  OT Visit Diagnosis: Unsteadiness on feet (R26.81);Dizziness and giddiness (R42)   Activity Tolerance     Patient Left in bed;with call bell/phone within reach;with bed alarm set   Nurse Communication          Time: 2130-8657 OT Time Calculation (min): 20 min  Charges: OT General Charges $OT Visit: 1 Visit OT Treatments $Self Care/Home Management : 8-22 mins     Britt Bottom 09/18/2020, 2:36 PM

## 2020-09-18 NOTE — Progress Notes (Signed)
RE: David Bradshaw Date of Birth: [birth year] Date: 09/18/20  Please be advised that the above-named patient will require a short-term nursing home stay - anticipated 30 days or less for rehabilitation and strengthening.  The plan is for return home.

## 2020-09-18 NOTE — Plan of Care (Signed)
  Problem: Clinical Measurements: Goal: Diagnostic test results will improve Outcome: Progressing Goal: Respiratory complications will improve Outcome: Progressing   Problem: Activity: Goal: Risk for activity intolerance will decrease Outcome: Progressing   Problem: Coping: Goal: Level of anxiety will decrease Outcome: Progressing   Problem: Safety: Goal: Ability to remain free from injury will improve Outcome: Progressing

## 2020-09-18 NOTE — Plan of Care (Signed)
°  Problem: Clinical Measurements: °Goal: Respiratory complications will improve °Outcome: Progressing °Goal: Cardiovascular complication will be avoided °Outcome: Progressing °  °Problem: Activity: °Goal: Risk for activity intolerance will decrease °Outcome: Progressing °  °

## 2020-09-18 NOTE — Plan of Care (Signed)
  Problem: Education: Goal: Knowledge of General Education information will improve Description: Including pain rating scale, medication(s)/side effects and non-pharmacologic comfort measures Outcome: Adequate for Discharge   Problem: Health Behavior/Discharge Planning: Goal: Ability to manage health-related needs will improve Outcome: Adequate for Discharge   Problem: Clinical Measurements: Goal: Ability to maintain clinical measurements within normal limits will improve Outcome: Adequate for Discharge Goal: Will remain free from infection Outcome: Adequate for Discharge Goal: Diagnostic test results will improve Outcome: Adequate for Discharge Goal: Respiratory complications will improve Outcome: Adequate for Discharge Goal: Cardiovascular complication will be avoided Outcome: Adequate for Discharge   Problem: Activity: Goal: Risk for activity intolerance will decrease Outcome: Adequate for Discharge   Problem: Nutrition: Goal: Adequate nutrition will be maintained Outcome: Adequate for Discharge   Problem: Coping: Goal: Level of anxiety will decrease Outcome: Adequate for Discharge   Problem: Elimination: Goal: Will not experience complications related to bowel motility Outcome: Adequate for Discharge Goal: Will not experience complications related to urinary retention Outcome: Adequate for Discharge   Problem: Pain Managment: Goal: General experience of comfort will improve Outcome: Adequate for Discharge   Problem: Safety: Goal: Ability to remain free from injury will improve Outcome: Adequate for Discharge   Problem: Skin Integrity: Goal: Risk for impaired skin integrity will decrease Outcome: Adequate for Discharge   Problem: Education: Goal: Ability to demonstrate management of disease process will improve Outcome: Adequate for Discharge Goal: Ability to verbalize understanding of medication therapies will improve Outcome: Adequate for Discharge Goal:  Individualized Educational Video(s) Outcome: Adequate for Discharge   Problem: Activity: Goal: Capacity to carry out activities will improve Outcome: Adequate for Discharge   Problem: Cardiac: Goal: Ability to achieve and maintain adequate cardiopulmonary perfusion will improve Outcome: Adequate for Discharge   Problem: Education: Goal: Knowledge of General Education information will improve Description: Including pain rating scale, medication(s)/side effects and non-pharmacologic comfort measures Outcome: Adequate for Discharge   Problem: Health Behavior/Discharge Planning: Goal: Ability to manage health-related needs will improve Outcome: Adequate for Discharge   Problem: Clinical Measurements: Goal: Ability to maintain clinical measurements within normal limits will improve Outcome: Adequate for Discharge Goal: Will remain free from infection Outcome: Adequate for Discharge Goal: Diagnostic test results will improve Outcome: Adequate for Discharge Goal: Respiratory complications will improve Outcome: Adequate for Discharge Goal: Cardiovascular complication will be avoided Outcome: Adequate for Discharge   Problem: Activity: Goal: Risk for activity intolerance will decrease Outcome: Adequate for Discharge   Problem: Nutrition: Goal: Adequate nutrition will be maintained Outcome: Adequate for Discharge   Problem: Coping: Goal: Level of anxiety will decrease Outcome: Adequate for Discharge   Problem: Elimination: Goal: Will not experience complications related to bowel motility Outcome: Adequate for Discharge Goal: Will not experience complications related to urinary retention Outcome: Adequate for Discharge   Problem: Pain Managment: Goal: General experience of comfort will improve Outcome: Adequate for Discharge   Problem: Safety: Goal: Ability to remain free from injury will improve Outcome: Adequate for Discharge   Problem: Skin Integrity: Goal: Risk for  impaired skin integrity will decrease Outcome: Adequate for Discharge

## 2020-09-18 NOTE — Care Management Important Message (Signed)
Important Message  Patient Details  Name: David DEVINCENZI Sr. MRN: [id number] Date of Birth: 05-31-1927   Medicare Important Message Given:  Yes     Shelda Altes 09/18/2020, 11:13 AM

## 2020-09-19 DIAGNOSIS — J810 Acute pulmonary edema: Secondary | ICD-10-CM | POA: Diagnosis not present

## 2020-09-19 DIAGNOSIS — I509 Heart failure, unspecified: Secondary | ICD-10-CM

## 2020-09-19 DIAGNOSIS — K7460 Unspecified cirrhosis of liver: Secondary | ICD-10-CM | POA: Diagnosis not present

## 2020-09-19 DIAGNOSIS — N179 Acute kidney failure, unspecified: Secondary | ICD-10-CM | POA: Diagnosis not present

## 2020-09-19 NOTE — Progress Notes (Addendum)
[facility]                                                                              Patient Demographics  David Bradshaw, is a 87 y.o. male, DOB - Sep 04, 1926, LPF:[id number]  Admit date - 09/13/2020   Admitting Physician Barb Merino, MD  Outpatient Primary MD for the patient is Pleas Koch, NP  Outpatient specialists:   LOS - 5  days   Medical records reviewed and are as summarized below:    Chief Complaint  Patient presents with  . Shortness of Breath  . Fall  . Abnormal ECG       Brief summary   87 year old with history of stroke, coronary artery disease, NASH cirrhosis and recently diagnosed hepatocellular carcinoma on surveillance presented to the hospital with progressive shortness of breath, weakness and fall. Patient also reported bloating for last few months. More than 10 pound weight gain in 1 month. Difficulty walking around. Lower extremity edema. Lost balance and fell back when he was trying to get to the car. Recent echocardiogram with ejection fraction 55%.   Assessment & Plan    Cirrhosis of liver with ascites/decompensated liver cirrhosis. Known hepatocellular carcinoma: -Presented with decompensated liver cirrhosis with weight gain, bloating, lower extremity edema, difficulty ambulating.  Symptoms difficult to control at home with conservative management and oral diuresis. -Patient was placed on IV Lasix, continued on Aldactone. -Underwent paracentesis, 3.4 L removed. -However with high-dose diuretics, patient had significant orthostatic drop and BP and dizziness and not tolerating escalating doses of diuretics -Continue Lasix 20 mg daily, Aldactone 50 mg daily..   -Midodrine was added, 5 mg 3 times daily with improvement in orthostatic symptoms.  -Patient was seen by gastroenterology, no new recommendations at this time.  He has a ERCP planned as outpatient. -PT OT recommended skilled nursing facility  Acute kidney  injury on CKD stage IIIb Baseline creatinine 1.3-1.4.  Presented with creatinine of 1.9 -Currently improving, 1.5, not a candidate for hemodialysis  Right leg wound -Seen by wound care, continue local wound care  Generalized debility, ambulatory dysfunction -Lives at home alone, patient was followed by palliative care team. -PT recommended skilled nursing facility for inpatient therapies before returning home, currently awaiting SNF bed   Moderate protein calorie malnutrition in the context of chronic illness, malignancy and acute decompensated cirrhosis. Estimated body mass index is 31.13 kg/m as calculated from the following:   Height as of this encounter: 5' (1.524 m).   Weight as of this encounter: 72.3 kg.  Code Status: DNR DVT Prophylaxis:  SCDs Start: 09/13/20 1725   Level of Care: Level of care: Telemetry Medical Family Communication: Discussed all imaging results, lab results, explained to the patient    Disposition Plan:     Status is: Inpatient  Remains inpatient appropriate because:Inpatient level of care appropriate due to severity of illness   Dispo:  Patient From: Home  Planned Disposition: [place]  Medically stable for discharge: No, awaiting skilled nursing facility when bed available        Time Spent in minutes 25 minutes  Procedures:  None  Consultants:  Gastroenterology   Antimicrobials:   Anti-infectives (From admission, onward)   None          Medications  Scheduled Meds: . docusate sodium  100 mg Oral BID  . feeding supplement  237 mL Oral BID BM  . furosemide  20 mg Oral Daily  . midodrine  5 mg Oral TID WC  . multivitamin with minerals  1 tablet Oral Daily  . pantoprazole  40 mg Oral BID  . sodium chloride flush  3 mL Intravenous Q12H  . spironolactone  50 mg Oral Daily   Continuous Infusions: PRN Meds:.acetaminophen **OR** acetaminophen, bisacodyl, lidocaine, ondansetron **OR** ondansetron (ZOFRAN)  IV, oxyCODONE, polyethylene glycol      Subjective:   Lesly Joslyn was seen and examined today.  No acute complaints.  Overnight no acute events.  Currently no dizziness, still has lower extremity edema. Patient denies dizziness, chest pain, abdominal pain, N/V/D/C.  No acute events overnight.    Objective:   Vitals:   09/18/20 2220 09/19/20 0500 09/19/20 0750 09/19/20 1151  BP: 129/70  123/61 92/69  Pulse: 82  96 86  Resp: 20   16  Temp: 98.3 F (36.8 C)  98.3 F (36.8 C) 97.7 F (36.5 C)  TempSrc: Oral  Oral Oral  SpO2: 92%   94%  Weight:  72.3 kg    Height:        Intake/Output Summary (Last 24 hours) at 09/19/2020 1509 Last data filed at 09/19/2020 1300 Gross per 24 hour  Intake 800 ml  Output 750 ml  Net 50 ml     Wt Readings from Last 3 Encounters:  09/19/20 72.3 kg  08/14/20 74.4 kg  08/07/20 73.9 kg     Exam  General: Frail, ill-appearing  Cardiovascular: S1 S2 auscultated, RRR  Respiratory: Clear to auscultation bilaterally\y  Gastrointestinal: Soft, nontender, distended, + bowel sounds  Ext: 2+ pedal edema bilaterally  Neuro: no acute deficits  Musculoskeletal: No digital cyanosis, clubbing  Skin: No rashes     Data Reviewed:  I have personally reviewed following labs and imaging studies  Micro Results Recent Results (from the past 240 hour(s))  Resp Panel by RT-PCR (Flu A&B, Covid) Nasopharyngeal Swab     Status: None   Collection Time: 09/13/20  1:33 PM   Specimen: Nasopharyngeal Swab; Nasopharyngeal(NP) swabs in vial transport medium  Result Value Ref Range Status   SARS Coronavirus 2 by RT PCR NEGATIVE NEGATIVE Final    Comment: (NOTE) SARS-CoV-2 target nucleic acids are NOT DETECTED.  The SARS-CoV-2 RNA is generally detectable in upper respiratory specimens during the acute phase of infection. The lowest concentration of SARS-CoV-2 viral copies this assay can detect is 138 copies/mL. A negative result does not preclude  SARS-Cov-2 infection and should not be used as the sole basis for treatment or other patient management decisions. A negative result may occur with  improper specimen collection/handling, submission of specimen other than nasopharyngeal swab, presence of viral mutation(s) within the areas targeted by this assay, and inadequate number of viral copies(<138 copies/mL). A negative result must be combined with clinical observations, patient history, and epidemiological information. The expected result is Negative.  Fact Sheet for Patients:  EntrepreneurPulse.com.au  Fact Sheet for Healthcare Providers:  IncredibleEmployment.be  This test is no t yet approved or cleared by the [place] FDA and  has been authorized for detection and/or diagnosis of SARS-CoV-2 by FDA under an Emergency Use Authorization (EUA). This EUA will remain  in effect (meaning this  test can be used) for the duration of the COVID-19 declaration under Section 564(b)(1) of the Act, 21 U.S.C.section 360bbb-3(b)(1), unless the authorization is terminated  or revoked sooner.       Influenza A by PCR NEGATIVE NEGATIVE Final   Influenza B by PCR NEGATIVE NEGATIVE Final    Comment: (NOTE) The Xpert Xpress SARS-CoV-2/FLU/RSV plus assay is intended as an aid in the diagnosis of influenza from Nasopharyngeal swab specimens and should not be used as a sole basis for treatment. Nasal washings and aspirates are unacceptable for Xpert Xpress SARS-CoV-2/FLU/RSV testing.  Fact Sheet for Patients: EntrepreneurPulse.com.au  Fact Sheet for Healthcare Providers: IncredibleEmployment.be  This test is not yet approved or cleared by the [place] FDA and has been authorized for detection and/or diagnosis of SARS-CoV-2 by FDA under an Emergency Use Authorization (EUA). This EUA will remain in effect (meaning this test can be used) for the duration of  the COVID-19 declaration under Section 564(b)(1) of the Act, 21 U.S.C. section 360bbb-3(b)(1), unless the authorization is terminated or revoked.  Performed at [facility], [place] [address] [place], [place] [address]   SARS CORONAVIRUS 2 (TAT 6-24 HRS) Nasopharyngeal Nasopharyngeal Swab     Status: None   Collection Time: 09/18/20  2:55 PM   Specimen: Nasopharyngeal Swab  Result Value Ref Range Status   SARS Coronavirus 2 NEGATIVE NEGATIVE Final    Comment: (NOTE) SARS-CoV-2 target nucleic acids are NOT DETECTED.  The SARS-CoV-2 RNA is generally detectable in upper and lower respiratory specimens during the acute phase of infection. Negative results do not preclude SARS-CoV-2 infection, do not rule out co-infections with other pathogens, and should not be used as the sole basis for treatment or other patient management decisions. Negative results must be combined with clinical observations, patient history, and epidemiological information. The expected result is Negative.  Fact Sheet for Patients: SugarRoll.be  Fact Sheet for Healthcare Providers: https://www.woods-mathews.com/  This test is not yet approved or cleared by the [place] FDA and  has been authorized for detection and/or diagnosis of SARS-CoV-2 by FDA under an Emergency Use Authorization (EUA). This EUA will remain  in effect (meaning this test can be used) for the duration of the COVID-19 declaration under Se ction 564(b)(1) of the Act, 21 U.S.C. section 360bbb-3(b)(1), unless the authorization is terminated or revoked sooner.  Performed at [facility], [place] [address], [place], [place] [address]     Radiology Reports CT Head Wo Contrast  Result Date: 09/13/2020 CLINICAL DATA:  Pain following fall EXAM: CT HEAD WITHOUT CONTRAST TECHNIQUE: Contiguous axial images were obtained from the base of the skull through the vertex without intravenous contrast.  COMPARISON:  January 30, 2016 head CT and brain MRI. FINDINGS: Brain: Moderate diffuse atrophy is stable. There is no intracranial mass, hemorrhage, extra-axial fluid collection, or midline shift. There is evidence of a prior small infarct in the posterior mid left cerebellum, stable. There is evidence of a prior infarct in the right mid parietal lobe posteriorly, stable. There is decreased attenuation in periventricular white matter consistent with periventricular small vessel disease. No acute infarct is evident. Vascular: No hyperdense vessel. Calcification noted in each carotid siphon region. Skull: Bony calvarium appears intact. Stable 7 mm left frontal dural calcification, a likely small enostosis. No surrounding edema. This is a finding of no clinical significance. Sinuses/Orbits: There is slight mucosal thickening in several ethmoid air cells. Other visualized paranasal sinuses are clear. Orbits appear symmetric bilaterally. Other: Mastoid  air cells are clear. IMPRESSION: Atrophy with periventricular small vessel disease. Prior infarct in the mid to posterior right parietal lobe and in the posterior mid left cerebellum. No acute infarct appreciable. No mass or hemorrhage. There are foci of arterial vascular calcification. There is mucosal thickening in several ethmoid air cells. Electronically Signed   By: Lowella Grip III M.D.   On: 09/13/2020 15:11   US Abdomen Limited  Result Date: 09/14/2020 CLINICAL DATA:  87 year old with abdominal ascites. History of hepatocellular carcinoma. EXAM: ULTRASOUND ABDOMEN LIMITED RIGHT UPPER QUADRANT COMPARISON:  MRI 07/05/2020 FINDINGS: Gallbladder: Not visualized. Common bile duct: Diameter: 4 mm Liver: Liver is severely nodular and compatible with cirrhosis. Liver parenchyma is mildly heterogeneous. Large amount of perihepatic ascites. Patient has a known right hepatic lesion that is not clearly identified on this examination. Portal vein is patent on color  Doppler imaging with normal direction of blood flow towards the liver. Other: Minimal fluid in the right lower quadrant. Large amount of ascites in the left upper quadrant. Large amount of ascites in left lower quadrant. IMPRESSION: 1. Cirrhotic liver with a large amount of ascites. 2. Gallbladder is not visualized. 3. Known hepatic lesion is not identified on this examination. Electronically Signed   By: Markus Daft M.D.   On: 09/14/2020 16:54   DG Chest Port 1 View  Result Date: 09/13/2020 CLINICAL DATA:  Shortness of breath. EXAM: PORTABLE CHEST 1 VIEW COMPARISON:  Chest x-ray dated June 06, 2020. FINDINGS: The heart size and mediastinal contours are within normal limits. Normal pulmonary vascularity. Emphysematous changes again noted. Increasing small left pleural effusion with left basilar opacity. Unchanged scarring at the right lung base. No pneumothorax. No acute osseous abnormality. IMPRESSION: 1. Increasing small left pleural effusion with left basilar atelectasis versus infiltrate. Electronically Signed   By: Titus Dubin M.D.   On: 09/13/2020 13:56   ECHOCARDIOGRAM COMPLETE  Result Date: 08/27/2020    ECHOCARDIOGRAM REPORT   Patient Name:   THADD APUZZO Sr. Date of Exam: 08/27/2020 Medical Rec #:  [id number]           Height:       60.0 in Accession #:    [id number]          Weight:       164.0 lb Date of Birth:  July 26, 1926          BSA:          1.716 m Patient Age:    70 years            BP:           110/60 mmHg Patient Gender: M                   HR:           99 bpm. Exam Location:  [address] Procedure: 2D Echo, Cardiac Doppler and Color Doppler Indications:    R06.00 Dyspnea  History:        Patient has prior history of Echocardiogram examinations, most                 recent 01/31/2016. Stroke. Edema. Myocardial Infarction.  Sonographer:    Cresenciano Lick RDCS Referring Phys: [id number] [place]  1. Limited study due to poor acoustic windows and lack of  definity contrast. Left ventricular ejection fraction, by estimation, is 50 to 55%. The left ventricle has low normal function. The left ventricle has no regional wall motion  abnormalities. There  is mild concentric left ventricular hypertrophy. Left ventricular diastolic parameters are indeterminate.  2. Right ventricular systolic function is normal. The right ventricular size is normal.  3. The mitral valve is degenerative. Trivial mitral valve regurgitation.  4. The aortic valve is not well visualized, however, there appears to be mild-to-moderate aortic stenosis base on doppler interrogation with AVA 1.3cm2, mean gradient 38mmHg, peak gradient 22mmHg, Vmax 2.71m/s. Notably the LVOT VTI is an incomplete signal and therefore the AVA may underestimate true valve area.  5. There is mild aortic regurgitation. Comparison(s): Compared to prior echo report in 2017, the LVEF now appears to be 50-55%. Degree of aortic stenosis is unchanged (previous mean gradient in 2017 was 39mmHg). FINDINGS  Left Ventricle: Limited study due to poor acoustic windows and lack of definity contrast. Left ventricular ejection fraction, by estimation, is 50 to 55%. The left ventricle has low normal function. The left ventricle has no regional wall motion abnormalities. The left ventricular internal cavity size was normal in size. There is mild concentric left ventricular hypertrophy. Left ventricular diastolic parameters are indeterminate. Right Ventricle: The right ventricular size is normal. No increase in right ventricular wall thickness. Right ventricular systolic function is normal. Left Atrium: Left atrial size was normal in size. Right Atrium: Right atrial size was normal in size. Pericardium: There is no evidence of pericardial effusion. Mitral Valve: The mitral valve is degenerative in appearance. There is mild thickening of the mitral valve leaflet(s). There is mild calcification of the mitral valve leaflet(s). Mild to moderate  mitral annular calcification. Trivial mitral valve regurgitation. Tricuspid Valve: The tricuspid valve is normal in structure. Tricuspid valve regurgitation is trivial. Aortic Valve: Aortic valve is not well visualized, however, there appears to be mild-to-moderate aortic stenosis with AVA 1.1cm2, mean gradient 5mmHg, peak gradient 81mmHg, Vmax 2.23m/s. Notably the LVOT VTI is an incomplete signal and therefore the AVA may underestimate true valve area. The aortic valve was not well visualized. Aortic valve regurgitation is mild. Aortic regurgitation PHT measures 275 msec. Mild to moderate aortic stenosis is present. Aortic valve mean gradient measures 18.0 mmHg. Aortic valve peak gradient measures 28.0 mmHg. Aortic valve area, by VTI measures 1.35 cm. Pulmonic Valve: The pulmonic valve was not well visualized. Pulmonic valve regurgitation is not visualized. Aorta: The aortic root is normal in size and structure. IAS/Shunts: No atrial level shunt detected by color flow Doppler.  LEFT VENTRICLE PLAX 2D LVIDd:         4.50 cm  Diastology LVIDs:         3.50 cm  LV e' medial:    3.48 cm/s LV PW:         1.00 cm  LV E/e' medial:  12.4 LV IVS:        1.00 cm  LV e' lateral:   4.03 cm/s LVOT diam:     2.20 cm  LV E/e' lateral: 10.7 LV SV:         61 LV SV Index:   36 LVOT Area:     3.80 cm  RIGHT VENTRICLE RV Basal diam:  2.90 cm RV S prime:     9.70 cm/s TAPSE (M-mode): 2.1 cm LEFT ATRIUM             Index       RIGHT ATRIUM          Index LA diam:        4.00 cm 2.33 cm/m  RA Area:  7.03 cm LA Vol (A2C):   24.4 ml 14.22 ml/m RA Volume:   11.80 ml 6.88 ml/m LA Vol (A4C):   19.2 ml 11.19 ml/m LA Biplane Vol: 23.0 ml 13.41 ml/m  AORTIC VALVE AV Area (Vmax):    0.82 cm AV Area (Vmean):   0.86 cm AV Area (VTI):     1.35 cm AV Vmax:           264.50 cm/s AV Vmean:          203.750 cm/s AV VTI:            0.452 m AV Peak Grad:      28.0 mmHg AV Mean Grad:      18.0 mmHg LVOT Vmax:         57.05 cm/s LVOT Vmean:         45.950 cm/s LVOT VTI:          0.161 m LVOT/AV VTI ratio: 0.36 AI PHT:            275 msec  AORTA Ao Root diam: 3.50 cm MV E velocity: 43.20 cm/s MV A velocity: 110.00 cm/s  SHUNTS MV E/A ratio:  0.39         Systemic VTI:  0.16 m                             Systemic Diam: 2.20 cm Gwyndolyn Kaufman MD Electronically signed by Gwyndolyn Kaufman MD Signature Date/Time: 08/27/2020/4:22:05 PM    Final    IR Paracentesis  Result Date: 09/14/2020 INDICATION: Patient with history of cirrhosis found to have ascites. Request is made for therapeutic paracentesis. EXAM: ULTRASOUND GUIDED THERAPEUTIC PARACENTESIS MEDICATIONS: 10 mL 1% lidocaine COMPLICATIONS: None immediate. PROCEDURE: Informed written consent was obtained from the patient after a discussion of the risks, benefits and alternatives to treatment. A timeout was performed prior to the initiation of the procedure. Initial ultrasound scanning demonstrates a moderate amount of ascites within the left lateral abdomen. The left lateral abdomen was prepped and draped in the usual sterile fashion. 1% lidocaine was used for local anesthesia. Following this, a 19 gauge, 7-cm, Yueh catheter was introduced. An ultrasound image was saved for documentation purposes. The paracentesis was performed. The catheter was removed and a dressing was applied. The patient tolerated the procedure well without immediate post procedural complication. FINDINGS: A total of approximately 3.4 liters of yellow fluid was removed. Procedure was stopped prior to removal of fluid due to patient developing asymptomatic hypotension. IMPRESSION: Successful ultrasound-guided paracentesis yielding 3.4 liters of peritoneal fluid. Read by: Brynda Greathouse PA-C Electronically Signed   By: Jacqulynn Cadet M.D.   On: 09/14/2020 17:18    Lab Data:  CBC: Recent Labs  Lab 09/13/20 1323 09/13/20 1340 09/14/20 0426 09/16/20 0327  WBC 10.8*  --  9.1 8.9  NEUTROABS 7.4  --   --  5.7  HGB 17.2*  17.3* 15.0 15.2  HCT 52.5* 51.0 44.9 45.3  MCV 99.1  --  95.7 95.2  PLT 191  --  175 449*   Basic Metabolic Panel: Recent Labs  Lab 09/13/20 1323 09/13/20 1340 09/14/20 0426 09/16/20 0327  NA 141 142 140 138  K 4.6 4.6 4.0 4.3  CL 105 106 105 103  CO2 27  --  28 27  GLUCOSE 124* 122* 90 110*  BUN 21 26* 22 31*  CREATININE 1.97* 1.70* 1.84* 1.59*  CALCIUM 8.3*  --  8.1*  7.9*  MG  --   --   --  2.1  PHOS  --   --   --  2.8   GFR: Estimated Creatinine Clearance: 25.2 mL/min (A) (by C-G formula based on SCr of 1.59 mg/dL (H)). Liver Function Tests: Recent Labs  Lab 09/13/20 1323 09/14/20 0426 09/16/20 0327  AST 49* 32 33  ALT 30 25 23   ALKPHOS 395* 306* 248*  BILITOT 1.6* 1.6* 1.1  PROT 6.2* 5.3* 5.0*  ALBUMIN 2.5* 2.1* 2.2*   No results for input(s): LIPASE, AMYLASE in the last 168 hours. No results for input(s): AMMONIA in the last 168 hours. Coagulation Profile: Recent Labs  Lab 09/13/20 1323 09/14/20 0426  INR 1.0 1.1   Cardiac Enzymes: No results for input(s): CKTOTAL, CKMB, CKMBINDEX, TROPONINI in the last 168 hours. BNP (last 3 results) Recent Labs    07/23/20 1023 08/02/20 1202  PROBNP 427.0* 354.0*   HbA1C: No results for input(s): HGBA1C in the last 72 hours. CBG: No results for input(s): GLUCAP in the last 168 hours. Lipid Profile: No results for input(s): CHOL, HDL, LDLCALC, TRIG, CHOLHDL, LDLDIRECT in the last 72 hours. Thyroid Function Tests: No results for input(s): TSH, T4TOTAL, FREET4, T3FREE, THYROIDAB in the last 72 hours. Anemia Panel: No results for input(s): VITAMINB12, FOLATE, FERRITIN, TIBC, IRON, RETICCTPCT in the last 72 hours. Urine analysis:    Component Value Date/Time   COLORURINE YELLOW 02/14/2016 2143   APPEARANCEUR HAZY (A) 02/14/2016 2143   LABSPEC 1.011 02/14/2016 2143   PHURINE 5.5 02/14/2016 2143   GLUCOSEU NEGATIVE 02/14/2016 2143   HGBUR SMALL (A) 02/14/2016 2143   BILIRUBINUR NEGATIVE 02/14/2016 2143    KETONESUR NEGATIVE 02/14/2016 2143   PROTEINUR NEGATIVE 02/14/2016 2143   UROBILINOGEN 1.0 03/21/2007 0100   NITRITE NEGATIVE 02/14/2016 2143   LEUKOCYTESUR NEGATIVE 02/14/2016 2143     Ellanie Oppedisano M.D. [facility] 09/19/2020, 3:09 PM  Available via Epic secure chat 7am-7pm After 7 pm, please refer to night coverage provider listed on amion.

## 2020-09-19 NOTE — Progress Notes (Signed)
Nutrition Follow-up  DOCUMENTATION CODES:   Non-severe (moderate) malnutrition in context of chronic illness  INTERVENTION:   -Continue MVI with minerals daily -Continue Ensure Enlive po BID, each supplement provides 350 kcal and 20 grams of protein  NUTRITION DIAGNOSIS:   Moderate Malnutrition related to chronic illness (cirrhosis and hepatocellular cancer) as evidenced by estimated needs.  Ongoing  GOAL:   Patient will meet greater than or equal to 90% of their needs  Progressing   MONITOR:   PO intake,Supplement acceptance,Diet advancement,Labs,Weight trends,Skin,I & O's  REASON FOR ASSESSMENT:   Consult Assessment of nutrition requirement/status  ASSESSMENT:   David BUHL Sr. is a 87 y.o. male with medical history significant of CVA; CAD; cirrhosis with hepatocellular CA; and carotid and aortic stenosis presenting with SOB, fall.  *4/1- s/p paracentesis (3.4 L fluid removed)  Reviewed I/O's: -160 ml x 24 hours and -1.3 L since admission  UOP: 500 ml x 24 hours  Pt unavailable at time of visit. Attempted to speak with pt via call to hospital room phone, however, unable to reach.   Pt remains with good appetite. Noted meal completion 50-100%. Pt is also consuming Ensure Enlive supplements per MAR.   Reviewed wt hx; pt has experienced a 5.6% wt loss over the past week. Suspect this is secondary to fluid loss and paracentesis.   Per TOC notes, pt awaiting insurance authorization for SNF placement. Pt is medically stable for discharge.   Medications reviewed and include colace and aldactone.   Labs reviewed.   Diet Order:   Diet Order            Diet - low sodium heart healthy           Diet Heart Room service appropriate? Yes; Fluid consistency: Thin; Fluid restriction: 1500 mL Fluid  Diet effective now                 EDUCATION NEEDS:   Education needs have been addressed  Skin:  Skin Assessment: Reviewed RN Assessment  Last BM:   09/18/20  Height:   Ht Readings from Last 1 Encounters:  09/13/20 5' (1.524 m)    Weight:   Wt Readings from Last 1 Encounters:  09/19/20 72.3 kg    Ideal Body Weight:  48.2 kg  BMI:  Body mass index is 31.13 kg/m.  Estimated Nutritional Needs:   Kcal:  1700-1900  Protein:  95-110 grams  Fluid:  > 1.7 L    Loistine Chance, RD, LDN, [place] Registered Dietitian II Certified Diabetes Care and Education Specialist Please refer to [facility] for RD and/or RD on-call/weekend/after hours pager

## 2020-09-19 NOTE — Plan of Care (Signed)

## 2020-09-19 NOTE — TOC Progression Note (Signed)
Transition of Care (TOC) - Progression Note    Patient Details  Name: David OLSHEFSKI Sr. MRN: [id number] Date of Birth: [birth year]  Transition of Care [facility]) CM/SW Contact  Reece Agar, [place] Phone Number: 09/19/2020, 2:47 PM  Clinical Narrative:    CSW spoke with Jackelyn Poling at [facility] to check on insurance status, there was no update. CSW will follow up.   Expected Discharge Plan: Skilled Nursing Facility Barriers to Discharge: Continued Medical Work up,Insurance Authorization,SNF Pending bed offer  Expected Discharge Plan and Services Expected Discharge Plan: [place] arrangements for the past 2 months: Single Family Home Expected Discharge Date: 09/18/20                                     Social Determinants of Health (SDOH) Interventions    Readmission Risk Interventions No flowsheet data found.

## 2020-09-20 DIAGNOSIS — R188 Other ascites: Secondary | ICD-10-CM | POA: Diagnosis not present

## 2020-09-20 DIAGNOSIS — J810 Acute pulmonary edema: Secondary | ICD-10-CM | POA: Diagnosis not present

## 2020-09-20 DIAGNOSIS — K7460 Unspecified cirrhosis of liver: Secondary | ICD-10-CM | POA: Diagnosis not present

## 2020-09-20 DIAGNOSIS — I509 Heart failure, unspecified: Secondary | ICD-10-CM | POA: Diagnosis not present

## 2020-09-20 NOTE — Plan of Care (Signed)
  Problem: Education: Goal: Knowledge of General Education information will improve Description: Including pain rating scale, medication(s)/side effects and non-pharmacologic comfort measures Outcome: Completed/Met   Problem: Health Behavior/Discharge Planning: Goal: Ability to manage health-related needs will improve Outcome: Completed/Met   Problem: Clinical Measurements: Goal: Ability to maintain clinical measurements within normal limits will improve Outcome: Completed/Met Goal: Will remain free from infection Outcome: Completed/Met Goal: Diagnostic test results will improve Outcome: Completed/Met Goal: Respiratory complications will improve Outcome: Completed/Met Goal: Cardiovascular complication will be avoided Outcome: Completed/Met   Problem: Activity: Goal: Risk for activity intolerance will decrease Outcome: Completed/Met   Problem: Nutrition: Goal: Adequate nutrition will be maintained Outcome: Completed/Met   Problem: Coping: Goal: Level of anxiety will decrease Outcome: Completed/Met   Problem: Elimination: Goal: Will not experience complications related to bowel motility Outcome: Completed/Met Goal: Will not experience complications related to urinary retention Outcome: Completed/Met   Problem: Pain Managment: Goal: General experience of comfort will improve Outcome: Completed/Met   Problem: Safety: Goal: Ability to remain free from injury will improve Outcome: Completed/Met   Problem: Skin Integrity: Goal: Risk for impaired skin integrity will decrease Outcome: Completed/Met   Problem: Education: Goal: Ability to demonstrate management of disease process will improve Outcome: Completed/Met Goal: Ability to verbalize understanding of medication therapies will improve Outcome: Completed/Met Goal: Individualized Educational Video(s) Outcome: Completed/Met   Problem: Activity: Goal: Capacity to carry out activities will improve Outcome:  Completed/Met   Problem: Cardiac: Goal: Ability to achieve and maintain adequate cardiopulmonary perfusion will improve Outcome: Completed/Met   Problem: Education: Goal: Knowledge of General Education information will improve Description: Including pain rating scale, medication(s)/side effects and non-pharmacologic comfort measures Outcome: Completed/Met   Problem: Health Behavior/Discharge Planning: Goal: Ability to manage health-related needs will improve Outcome: Completed/Met   Problem: Clinical Measurements: Goal: Ability to maintain clinical measurements within normal limits will improve Outcome: Completed/Met Goal: Will remain free from infection Outcome: Completed/Met Goal: Diagnostic test results will improve Outcome: Completed/Met Goal: Respiratory complications will improve Outcome: Completed/Met Goal: Cardiovascular complication will be avoided Outcome: Completed/Met   Problem: Activity: Goal: Risk for activity intolerance will decrease Outcome: Completed/Met   Problem: Nutrition: Goal: Adequate nutrition will be maintained Outcome: Completed/Met   Problem: Coping: Goal: Level of anxiety will decrease Outcome: Completed/Met   Problem: Elimination: Goal: Will not experience complications related to bowel motility Outcome: Completed/Met Goal: Will not experience complications related to urinary retention Outcome: Completed/Met   Problem: Pain Managment: Goal: General experience of comfort will improve Outcome: Completed/Met   Problem: Safety: Goal: Ability to remain free from injury will improve Outcome: Completed/Met   Problem: Skin Integrity: Goal: Risk for impaired skin integrity will decrease Outcome: Completed/Met   Problem: Health Behavior/Discharge Planning: Goal: Ability to manage health-related needs will improve Outcome: Completed/Met

## 2020-09-20 NOTE — Progress Notes (Signed)
Occupational Therapy Treatment Patient Details Name: EUGINE BUBB Sr. MRN: [id number] DOB: 04-03-1927 Today's Date: 09/20/2020    History of present illness 87 y.o. male presents to [facility] ED on 09/13/2020 with SOB and fall. Pt admitted with ascites, volume overload, and R foot wounds. Past medical history significant of CVA; CAD; cirrhosis with hepatocellular CA; and carotid and aortic stenosis.   OT comments  Patient continues to make steady progress towards goals in skilled OT session. Patient's session encompassed bed mobility, basic ADLs, and education with regard to energy conservation and fall prevention. Pt minimally self limiting in session, not wanting to participate in exercises, however willing to complete basic ADLs in recliner. Pt able to verbalize appropriate fall prevention techniques however when completing transfers back to bed was observed to be minimally impulsive when engaging in stand pivot transfers requiring cues for pacing and general safety awareness. Pt returned to supine with all needs met; therapy will continue to follow while in house. \   Follow Up Recommendations  Home health OT;SNF    Equipment Recommendations  Tub/shower seat    Recommendations for Other Services      Precautions / Restrictions Precautions Precautions: Fall;Other (comment) Precaution Comments: watch BP-low, 02 Restrictions Weight Bearing Restrictions: No Other Position/Activity Restrictions: Male pure wick       Mobility Bed Mobility Overal bed mobility: Needs Assistance Bed Mobility: Sit to Supine       Sit to supine: Min guard   General bed mobility comments: minimally impulsive, requiring cues for pacing to ensure safety    Transfers Overall transfer level: Needs assistance Equipment used: Rolling walker (2 wheeled) Transfers: Sit to/from [facility] Sit to Stand: Min guard Stand pivot transfers: Min guard       General transfer comment: no dizziness  reported, cues to pace due to impulsivity to date    Balance Overall balance assessment: Needs assistance Sitting-balance support: Feet supported;No upper extremity supported Sitting balance-Leahy Scale: Good     Standing balance support: During functional activity;Bilateral upper extremity supported   Standing balance comment: Requires external support for walking due to imbalance, able to stand statically without support.                           ADL either performed or assessed with clinical judgement   ADL Overall ADL's : Needs assistance/impaired     Grooming: Wash/dry hands;Wash/dry face;Sitting;Set up                   Toilet Transfer: Min guard;Ambulation;Stand-pivot;Comfort height toilet;Cueing for safety Toilet Transfer Details (indicate cue type and reason): simulated with transfer from chair to bed         Functional mobility during ADLs: Minimal assistance;Min guard;Cueing for safety;Cueing for sequencing General ADL Comments: pt continues to progress, minimally self limiting in session, but receptive to energy conservation techniques and fall prevention     Vision Baseline Vision/History: Macular Degeneration Patient Visual Report: No change from baseline     Perception     Praxis      Cognition Arousal/Alertness: Awake/alert Behavior During Therapy: WFL for tasks assessed/performed Overall Cognitive Status: Within Functional Limits for tasks assessed                                          Exercises     Shoulder Instructions  General Comments      Pertinent Vitals/ Pain       Pain Assessment: No/denies pain  Home Living                                          Prior Functioning/Environment              Frequency  Min 2X/week        Progress Toward Goals  OT Goals(current goals can now be found in the care plan section)  Progress towards OT goals: Progressing toward  goals  Acute Rehab OT Goals Patient Stated Goal: Goal is to return home OT Goal Formulation: With patient Time For Goal Achievement: 09/28/20 Potential to Achieve Goals: Good  Plan Discharge plan remains appropriate    Co-evaluation                 AM-PAC OT "6 Clicks" Daily Activity     Outcome Measure   Help from another person eating meals?: None Help from another person taking care of personal grooming?: A Little Help from another person toileting, which includes using toliet, bedpan, or urinal?: A Little Help from another person bathing (including washing, rinsing, drying)?: A Little Help from another person to put on and taking off regular upper body clothing?: None Help from another person to put on and taking off regular lower body clothing?: A Little 6 Click Score: 20    End of Session Equipment Utilized During Treatment: Rolling walker;Gait belt  OT Visit Diagnosis: Unsteadiness on feet (R26.81);Dizziness and giddiness (R42)   Activity Tolerance Patient tolerated treatment well   Patient Left in bed;with call bell/phone within reach;with bed alarm set   Nurse Communication Mobility status        Time: 0912-0927 OT Time Calculation (min): 15 min  Charges: OT General Charges $OT Visit: 1 Visit OT Treatments $Self Care/Home Management : 8-22 mins  Corinne Ports E. [place], [place] [facility] [place] 09/20/2020, 9:59 AM

## 2020-09-20 NOTE — TOC Progression Note (Signed)
Transition of Care (TOC) - Progression Note    Patient Details  Name: David ROYCROFT Sr. MRN: [id number] Date of Birth: [birth year]  Transition of Care [facility]) CM/SW Contact  Reece Agar, [place] Phone Number: 09/20/2020, 3:03 PM  Clinical Narrative:    2:45pm- Pt son reached out to get an update on fathers insurance auth, CSW informed him that Jackelyn Poling from [facility] has reached out today and still has not rec auth for pt. CSW will follow up.   Expected Discharge Plan: Skilled Nursing Facility Barriers to Discharge: Continued Medical Work up,Insurance Authorization,SNF Pending bed offer  Expected Discharge Plan and Services Expected Discharge Plan: [place] arrangements for the past 2 months: Single Family Home Expected Discharge Date: 09/18/20                                     Social Determinants of Health (SDOH) Interventions    Readmission Risk Interventions No flowsheet data found.

## 2020-09-20 NOTE — Progress Notes (Signed)
[facility]                                                                              Patient Demographics  David Bradshaw, is a 87 y.o. male, DOB - 28-May-1927, GGY:[id number]  Admit date - 09/13/2020   Admitting Physician Barb Merino, MD  Outpatient Primary MD for the patient is Pleas Koch, NP  Outpatient specialists:   LOS - 6  days   Medical records reviewed and are as summarized below:    Chief Complaint  Patient presents with  . Shortness of Breath  . Fall  . Abnormal ECG       Brief summary   87 year old with history of stroke, coronary artery disease, NASH cirrhosis and recently diagnosed hepatocellular carcinoma on surveillance presented to the hospital with progressive shortness of breath, weakness and fall. Patient also reported bloating for last few months. More than 10 pound weight gain in 1 month. Difficulty walking around. Lower extremity edema. Lost balance and fell back when he was trying to get to the car. Recent echocardiogram with ejection fraction 55%.   Assessment & Plan    Cirrhosis of liver with ascites/decompensated liver cirrhosis. Known hepatocellular carcinoma: -Presented with decompensated liver cirrhosis with weight gain, bloating, lower extremity edema, difficulty ambulating.  Symptoms difficult to control at home with conservative management and oral diuresis. -Patient was placed on IV Lasix, continued on Aldactone. -Underwent paracentesis, 3.4 L removed. -However with high-dose diuretics, patient had significant orthostatic drop and BP and dizziness and not tolerating escalating doses of diuretics -Continue Lasix 20 mg daily, Aldactone 50 mg daily..   -Midodrine was added, 5 mg 3 times daily with improvement in orthostatic symptoms.  -Patient was seen by gastroenterology, no new recommendations at this time.  He has a ERCP planned as outpatient. -PT OT recommended skilled nursing facility -No acute issues,  awaiting skilled nursing facility  Acute kidney injury on CKD stage IIIb Baseline creatinine 1.3-1.4.  Presented with creatinine of 1.9 -Currently improving, not a candidate for hemodialysis, BMET in a.m.  Right leg wound -Seen by wound care, continue local wound care  Generalized debility, ambulatory dysfunction -Lives at home alone, patient was followed by palliative care team. -PT recommended skilled nursing facility for inpatient therapies before returning home -Awaiting insurance authorization and skilled nursing facility bed   Moderate protein calorie malnutrition in the context of chronic illness, malignancy and acute decompensated cirrhosis. Estimated body mass index is 30.35 kg/m as calculated from the following:   Height as of this encounter: 5' (1.524 m).   Weight as of this encounter: 70.5 kg.  Code Status: DNR DVT Prophylaxis:  SCDs Start: 09/13/20 1725   Level of Care: Level of care: Telemetry Medical Family Communication: Discussed all imaging results, lab results, explained to the patient    Disposition Plan:     Status is: Inpatient  Remains inpatient appropriate because:Inpatient level of care appropriate due to severity of illness   Dispo:  Patient From: Home  Planned Disposition: [place]  Medically stable for discharge: yes, awaiting skilled nursing facility when bed available  Time Spent in minutes 25 minutes  Procedures:  None  Consultants:   Gastroenterology   Antimicrobials:   Anti-infectives (From admission, onward)   None         Medications  Scheduled Meds: . docusate sodium  100 mg Oral BID  . feeding supplement  237 mL Oral BID BM  . furosemide  20 mg Oral Daily  . midodrine  5 mg Oral TID WC  . multivitamin with minerals  1 tablet Oral Daily  . pantoprazole  40 mg Oral BID  . sodium chloride flush  3 mL Intravenous Q12H  . spironolactone  50 mg Oral Daily   Continuous Infusions: PRN  Meds:.acetaminophen **OR** acetaminophen, bisacodyl, lidocaine, ondansetron **OR** ondansetron (ZOFRAN) IV, oxyCODONE, polyethylene glycol      Subjective:   David Bradshaw was seen and examined today.  No complaints, sitting up in the chair, awaiting skilled nursing facility.  Still has lower extremity edema.  Patient denies dizziness, chest pain, abdominal pain, N/V/D/C.  No acute events overnight.    Objective:   Vitals:   09/20/20 0418 09/20/20 0427 09/20/20 0952 09/20/20 1148  BP: (!) 85/61   118/73  Pulse: 82   91  Resp: 18   18  Temp: 98.2 F (36.8 C)   (!) 97.4 F (36.3 C)  TempSrc: Oral   Oral  SpO2: 94%  92% 92%  Weight:  70.5 kg    Height:        Intake/Output Summary (Last 24 hours) at 09/20/2020 1516 Last data filed at 09/20/2020 1300 Gross per 24 hour  Intake 510 ml  Output 300 ml  Net 210 ml     Wt Readings from Last 3 Encounters:  09/20/20 70.5 kg  08/14/20 74.4 kg  08/07/20 73.9 kg    Physical Exam  General: Alert and oriented x 3, NAD, frail  Cardiovascular: S1 S2 clear, RRR. No pedal edema b/l  Respiratory: CTAB, no wheezing, rales or rhonchi  Gastrointestinal: Soft, nontender, nondistended, NBS  Ext: 2+ pedal edema bilaterally  Neuro: no new deficits  Musculoskeletal: No cyanosis, clubbing  Skin: No rashes  Psych: Normal affect and demeanor, alert and oriented x3     Data Reviewed:  I have personally reviewed following labs and imaging studies  Micro Results Recent Results (from the past 240 hour(s))  Resp Panel by RT-PCR (Flu A&B, Covid) Nasopharyngeal Swab     Status: None   Collection Time: 09/13/20  1:33 PM   Specimen: Nasopharyngeal Swab; Nasopharyngeal(NP) swabs in vial transport medium  Result Value Ref Range Status   SARS Coronavirus 2 by RT PCR NEGATIVE NEGATIVE Final    Comment: (NOTE) SARS-CoV-2 target nucleic acids are NOT DETECTED.  The SARS-CoV-2 RNA is generally detectable in upper respiratory specimens during  the acute phase of infection. The lowest concentration of SARS-CoV-2 viral copies this assay can detect is 138 copies/mL. A negative result does not preclude SARS-Cov-2 infection and should not be used as the sole basis for treatment or other patient management decisions. A negative result may occur with  improper specimen collection/handling, submission of specimen other than nasopharyngeal swab, presence of viral mutation(s) within the areas targeted by this assay, and inadequate number of viral copies(<138 copies/mL). A negative result must be combined with clinical observations, patient history, and epidemiological information. The expected result is Negative.  Fact Sheet for Patients:  EntrepreneurPulse.com.au  Fact Sheet for Healthcare Providers:  IncredibleEmployment.be  This test is no t yet approved or cleared by the  [place] Architectural technologist and  has been authorized for detection and/or diagnosis of SARS-CoV-2 by FDA under an Print production planner (EUA). This EUA will remain  in effect (meaning this test can be used) for the duration of the COVID-19 declaration under Section 564(b)(1) of the Act, 21 U.S.C.section 360bbb-3(b)(1), unless the authorization is terminated  or revoked sooner.       Influenza A by PCR NEGATIVE NEGATIVE Final   Influenza B by PCR NEGATIVE NEGATIVE Final    Comment: (NOTE) The Xpert Xpress SARS-CoV-2/FLU/RSV plus assay is intended as an aid in the diagnosis of influenza from Nasopharyngeal swab specimens and should not be used as a sole basis for treatment. Nasal washings and aspirates are unacceptable for Xpert Xpress SARS-CoV-2/FLU/RSV testing.  Fact Sheet for Patients: EntrepreneurPulse.com.au  Fact Sheet for Healthcare Providers: IncredibleEmployment.be  This test is not yet approved or cleared by the [place] FDA and has been authorized for detection and/or  diagnosis of SARS-CoV-2 by FDA under an Emergency Use Authorization (EUA). This EUA will remain in effect (meaning this test can be used) for the duration of the COVID-19 declaration under Section 564(b)(1) of the Act, 21 U.S.C. section 360bbb-3(b)(1), unless the authorization is terminated or revoked.  Performed at [facility], [place] [address], [place], [place] [address]   SARS CORONAVIRUS 2 (TAT 6-24 HRS) Nasopharyngeal Nasopharyngeal Swab     Status: None   Collection Time: 09/18/20  2:55 PM   Specimen: Nasopharyngeal Swab  Result Value Ref Range Status   SARS Coronavirus 2 NEGATIVE NEGATIVE Final    Comment: (NOTE) SARS-CoV-2 target nucleic acids are NOT DETECTED.  The SARS-CoV-2 RNA is generally detectable in upper and lower respiratory specimens during the acute phase of infection. Negative results do not preclude SARS-CoV-2 infection, do not rule out co-infections with other pathogens, and should not be used as the sole basis for treatment or other patient management decisions. Negative results must be combined with clinical observations, patient history, and epidemiological information. The expected result is Negative.  Fact Sheet for Patients: SugarRoll.be  Fact Sheet for Healthcare Providers: https://www.woods-mathews.com/  This test is not yet approved or cleared by the [place] FDA and  has been authorized for detection and/or diagnosis of SARS-CoV-2 by FDA under an Emergency Use Authorization (EUA). This EUA will remain  in effect (meaning this test can be used) for the duration of the COVID-19 declaration under Se ction 564(b)(1) of the Act, 21 U.S.C. section 360bbb-3(b)(1), unless the authorization is terminated or revoked sooner.  Performed at [facility], [place] [address] [place], [place] [address]     Radiology Reports CT Head Wo Contrast  Result Date: 09/13/2020 CLINICAL DATA:  Pain following  fall EXAM: CT HEAD WITHOUT CONTRAST TECHNIQUE: Contiguous axial images were obtained from the base of the skull through the vertex without intravenous contrast. COMPARISON:  January 30, 2016 head CT and brain MRI. FINDINGS: Brain: Moderate diffuse atrophy is stable. There is no intracranial mass, hemorrhage, extra-axial fluid collection, or midline shift. There is evidence of a prior small infarct in the posterior mid left cerebellum, stable. There is evidence of a prior infarct in the right mid parietal lobe posteriorly, stable. There is decreased attenuation in periventricular white matter consistent with periventricular small vessel disease. No acute infarct is evident. Vascular: No hyperdense vessel. Calcification noted in each carotid siphon region. Skull: Bony calvarium appears intact. Stable 7 mm left frontal dural calcification, a likely small enostosis. No surrounding edema.  This is a finding of no clinical significance. Sinuses/Orbits: There is slight mucosal thickening in several ethmoid air cells. Other visualized paranasal sinuses are clear. Orbits appear symmetric bilaterally. Other: Mastoid air cells are clear. IMPRESSION: Atrophy with periventricular small vessel disease. Prior infarct in the mid to posterior right parietal lobe and in the posterior mid left cerebellum. No acute infarct appreciable. No mass or hemorrhage. There are foci of arterial vascular calcification. There is mucosal thickening in several ethmoid air cells. Electronically Signed   By: Lowella Grip III M.D.   On: 09/13/2020 15:11   US Abdomen Limited  Result Date: 09/14/2020 CLINICAL DATA:  87 year old with abdominal ascites. History of hepatocellular carcinoma. EXAM: ULTRASOUND ABDOMEN LIMITED RIGHT UPPER QUADRANT COMPARISON:  MRI 07/05/2020 FINDINGS: Gallbladder: Not visualized. Common bile duct: Diameter: 4 mm Liver: Liver is severely nodular and compatible with cirrhosis. Liver parenchyma is mildly heterogeneous. Large  amount of perihepatic ascites. Patient has a known right hepatic lesion that is not clearly identified on this examination. Portal vein is patent on color Doppler imaging with normal direction of blood flow towards the liver. Other: Minimal fluid in the right lower quadrant. Large amount of ascites in the left upper quadrant. Large amount of ascites in left lower quadrant. IMPRESSION: 1. Cirrhotic liver with a large amount of ascites. 2. Gallbladder is not visualized. 3. Known hepatic lesion is not identified on this examination. Electronically Signed   By: Markus Daft M.D.   On: 09/14/2020 16:54   DG Chest Port 1 View  Result Date: 09/13/2020 CLINICAL DATA:  Shortness of breath. EXAM: PORTABLE CHEST 1 VIEW COMPARISON:  Chest x-ray dated June 06, 2020. FINDINGS: The heart size and mediastinal contours are within normal limits. Normal pulmonary vascularity. Emphysematous changes again noted. Increasing small left pleural effusion with left basilar opacity. Unchanged scarring at the right lung base. No pneumothorax. No acute osseous abnormality. IMPRESSION: 1. Increasing small left pleural effusion with left basilar atelectasis versus infiltrate. Electronically Signed   By: Titus Dubin M.D.   On: 09/13/2020 13:56   ECHOCARDIOGRAM COMPLETE  Result Date: 08/27/2020    ECHOCARDIOGRAM REPORT   Patient Name:   CHRISANGEL ESKENAZI Sr. Date of Exam: 08/27/2020 Medical Rec #:  [id number]           Height:       60.0 in Accession #:    [id number]          Weight:       164.0 lb Date of Birth:  12/26/1926          BSA:          1.716 m Patient Age:    68 years            BP:           110/60 mmHg Patient Gender: M                   HR:           99 bpm. Exam Location:  [address] Procedure: 2D Echo, Cardiac Doppler and Color Doppler Indications:    R06.00 Dyspnea  History:        Patient has prior history of Echocardiogram examinations, most                 recent 01/31/2016. Stroke. Edema. Myocardial Infarction.   Sonographer:    Cresenciano Lick RDCS Referring Phys: [id number] [place]  1. Limited study due to poor acoustic  windows and lack of definity contrast. Left ventricular ejection fraction, by estimation, is 50 to 55%. The left ventricle has low normal function. The left ventricle has no regional wall motion abnormalities. There  is mild concentric left ventricular hypertrophy. Left ventricular diastolic parameters are indeterminate.  2. Right ventricular systolic function is normal. The right ventricular size is normal.  3. The mitral valve is degenerative. Trivial mitral valve regurgitation.  4. The aortic valve is not well visualized, however, there appears to be mild-to-moderate aortic stenosis base on doppler interrogation with AVA 1.3cm2, mean gradient 35mmHg, peak gradient 36mmHg, Vmax 2.65m/s. Notably the LVOT VTI is an incomplete signal and therefore the AVA may underestimate true valve area.  5. There is mild aortic regurgitation. Comparison(s): Compared to prior echo report in 2017, the LVEF now appears to be 50-55%. Degree of aortic stenosis is unchanged (previous mean gradient in 2017 was 30mmHg). FINDINGS  Left Ventricle: Limited study due to poor acoustic windows and lack of definity contrast. Left ventricular ejection fraction, by estimation, is 50 to 55%. The left ventricle has low normal function. The left ventricle has no regional wall motion abnormalities. The left ventricular internal cavity size was normal in size. There is mild concentric left ventricular hypertrophy. Left ventricular diastolic parameters are indeterminate. Right Ventricle: The right ventricular size is normal. No increase in right ventricular wall thickness. Right ventricular systolic function is normal. Left Atrium: Left atrial size was normal in size. Right Atrium: Right atrial size was normal in size. Pericardium: There is no evidence of pericardial effusion. Mitral Valve: The mitral valve is  degenerative in appearance. There is mild thickening of the mitral valve leaflet(s). There is mild calcification of the mitral valve leaflet(s). Mild to moderate mitral annular calcification. Trivial mitral valve regurgitation. Tricuspid Valve: The tricuspid valve is normal in structure. Tricuspid valve regurgitation is trivial. Aortic Valve: Aortic valve is not well visualized, however, there appears to be mild-to-moderate aortic stenosis with AVA 1.1cm2, mean gradient 45mmHg, peak gradient 75mmHg, Vmax 2.38m/s. Notably the LVOT VTI is an incomplete signal and therefore the AVA may underestimate true valve area. The aortic valve was not well visualized. Aortic valve regurgitation is mild. Aortic regurgitation PHT measures 275 msec. Mild to moderate aortic stenosis is present. Aortic valve mean gradient measures 18.0 mmHg. Aortic valve peak gradient measures 28.0 mmHg. Aortic valve area, by VTI measures 1.35 cm. Pulmonic Valve: The pulmonic valve was not well visualized. Pulmonic valve regurgitation is not visualized. Aorta: The aortic root is normal in size and structure. IAS/Shunts: No atrial level shunt detected by color flow Doppler.  LEFT VENTRICLE PLAX 2D LVIDd:         4.50 cm  Diastology LVIDs:         3.50 cm  LV e' medial:    3.48 cm/s LV PW:         1.00 cm  LV E/e' medial:  12.4 LV IVS:        1.00 cm  LV e' lateral:   4.03 cm/s LVOT diam:     2.20 cm  LV E/e' lateral: 10.7 LV SV:         61 LV SV Index:   36 LVOT Area:     3.80 cm  RIGHT VENTRICLE RV Basal diam:  2.90 cm RV S prime:     9.70 cm/s TAPSE (M-mode): 2.1 cm LEFT ATRIUM             Index  RIGHT ATRIUM          Index LA diam:        4.00 cm 2.33 cm/m  RA Area:     7.03 cm LA Vol (A2C):   24.4 ml 14.22 ml/m RA Volume:   11.80 ml 6.88 ml/m LA Vol (A4C):   19.2 ml 11.19 ml/m LA Biplane Vol: 23.0 ml 13.41 ml/m  AORTIC VALVE AV Area (Vmax):    0.82 cm AV Area (Vmean):   0.86 cm AV Area (VTI):     1.35 cm AV Vmax:           264.50  cm/s AV Vmean:          203.750 cm/s AV VTI:            0.452 m AV Peak Grad:      28.0 mmHg AV Mean Grad:      18.0 mmHg LVOT Vmax:         57.05 cm/s LVOT Vmean:        45.950 cm/s LVOT VTI:          0.161 m LVOT/AV VTI ratio: 0.36 AI PHT:            275 msec  AORTA Ao Root diam: 3.50 cm MV E velocity: 43.20 cm/s MV A velocity: 110.00 cm/s  SHUNTS MV E/A ratio:  0.39         Systemic VTI:  0.16 m                             Systemic Diam: 2.20 cm Gwyndolyn Kaufman MD Electronically signed by Gwyndolyn Kaufman MD Signature Date/Time: 08/27/2020/4:22:05 PM    Final    IR Paracentesis  Result Date: 09/14/2020 INDICATION: Patient with history of cirrhosis found to have ascites. Request is made for therapeutic paracentesis. EXAM: ULTRASOUND GUIDED THERAPEUTIC PARACENTESIS MEDICATIONS: 10 mL 1% lidocaine COMPLICATIONS: None immediate. PROCEDURE: Informed written consent was obtained from the patient after a discussion of the risks, benefits and alternatives to treatment. A timeout was performed prior to the initiation of the procedure. Initial ultrasound scanning demonstrates a moderate amount of ascites within the left lateral abdomen. The left lateral abdomen was prepped and draped in the usual sterile fashion. 1% lidocaine was used for local anesthesia. Following this, a 19 gauge, 7-cm, Yueh catheter was introduced. An ultrasound image was saved for documentation purposes. The paracentesis was performed. The catheter was removed and a dressing was applied. The patient tolerated the procedure well without immediate post procedural complication. FINDINGS: A total of approximately 3.4 liters of yellow fluid was removed. Procedure was stopped prior to removal of fluid due to patient developing asymptomatic hypotension. IMPRESSION: Successful ultrasound-guided paracentesis yielding 3.4 liters of peritoneal fluid. Read by: Brynda Greathouse PA-C Electronically Signed   By: Jacqulynn Cadet M.D.   On: 09/14/2020 17:18     Lab Data:  CBC: Recent Labs  Lab 09/14/20 0426 09/16/20 0327  WBC 9.1 8.9  NEUTROABS  --  5.7  HGB 15.0 15.2  HCT 44.9 45.3  MCV 95.7 95.2  PLT 175 630*   Basic Metabolic Panel: Recent Labs  Lab 09/14/20 0426 09/16/20 0327  NA 140 138  K 4.0 4.3  CL 105 103  CO2 28 27  GLUCOSE 90 110*  BUN 22 31*  CREATININE 1.84* 1.59*  CALCIUM 8.1* 7.9*  MG  --  2.1  PHOS  --  2.8   GFR:  Estimated Creatinine Clearance: 24.9 mL/min (A) (by C-G formula based on SCr of 1.59 mg/dL (H)). Liver Function Tests: Recent Labs  Lab 09/14/20 0426 09/16/20 0327  AST 32 33  ALT 25 23  ALKPHOS 306* 248*  BILITOT 1.6* 1.1  PROT 5.3* 5.0*  ALBUMIN 2.1* 2.2*   No results for input(s): LIPASE, AMYLASE in the last 168 hours. No results for input(s): AMMONIA in the last 168 hours. Coagulation Profile: Recent Labs  Lab 09/14/20 0426  INR 1.1   Cardiac Enzymes: No results for input(s): CKTOTAL, CKMB, CKMBINDEX, TROPONINI in the last 168 hours. BNP (last 3 results) Recent Labs    07/23/20 1023 08/02/20 1202  PROBNP 427.0* 354.0*   HbA1C: No results for input(s): HGBA1C in the last 72 hours. CBG: No results for input(s): GLUCAP in the last 168 hours. Lipid Profile: No results for input(s): CHOL, HDL, LDLCALC, TRIG, CHOLHDL, LDLDIRECT in the last 72 hours. Thyroid Function Tests: No results for input(s): TSH, T4TOTAL, FREET4, T3FREE, THYROIDAB in the last 72 hours. Anemia Panel: No results for input(s): VITAMINB12, FOLATE, FERRITIN, TIBC, IRON, RETICCTPCT in the last 72 hours. Urine analysis:    Component Value Date/Time   COLORURINE YELLOW 02/14/2016 2143   APPEARANCEUR HAZY (A) 02/14/2016 2143   LABSPEC 1.011 02/14/2016 2143   PHURINE 5.5 02/14/2016 2143   GLUCOSEU NEGATIVE 02/14/2016 2143   HGBUR SMALL (A) 02/14/2016 2143   BILIRUBINUR NEGATIVE 02/14/2016 2143   KETONESUR NEGATIVE 02/14/2016 2143   PROTEINUR NEGATIVE 02/14/2016 2143   UROBILINOGEN 1.0 03/21/2007  0100   NITRITE NEGATIVE 02/14/2016 2143   LEUKOCYTESUR NEGATIVE 02/14/2016 2143     Nila Winker M.D. [facility] 09/20/2020, 3:16 PM  Available via Epic secure chat 7am-7pm After 7 pm, please refer to night coverage provider listed on amion.

## 2020-09-20 NOTE — Plan of Care (Signed)
  Problem: Cardiac: Goal: Ability to achieve and maintain adequate cardiopulmonary perfusion will improve Outcome: Progressing   

## 2020-09-21 DIAGNOSIS — C229 Malignant neoplasm of liver, not specified as primary or secondary: Secondary | ICD-10-CM | POA: Diagnosis not present

## 2020-09-21 DIAGNOSIS — I509 Heart failure, unspecified: Secondary | ICD-10-CM | POA: Diagnosis not present

## 2020-09-21 DIAGNOSIS — M2550 Pain in unspecified joint: Secondary | ICD-10-CM | POA: Diagnosis not present

## 2020-09-21 DIAGNOSIS — K7460 Unspecified cirrhosis of liver: Secondary | ICD-10-CM | POA: Diagnosis not present

## 2020-09-21 DIAGNOSIS — Z7401 Bed confinement status: Secondary | ICD-10-CM | POA: Diagnosis not present

## 2020-09-21 DIAGNOSIS — K7469 Other cirrhosis of liver: Secondary | ICD-10-CM | POA: Diagnosis not present

## 2020-09-21 DIAGNOSIS — Z743 Need for continuous supervision: Secondary | ICD-10-CM | POA: Diagnosis not present

## 2020-09-21 DIAGNOSIS — M6281 Muscle weakness (generalized): Secondary | ICD-10-CM | POA: Diagnosis not present

## 2020-09-21 DIAGNOSIS — R188 Other ascites: Secondary | ICD-10-CM | POA: Diagnosis not present

## 2020-09-21 DIAGNOSIS — N189 Chronic kidney disease, unspecified: Secondary | ICD-10-CM | POA: Diagnosis not present

## 2020-09-21 DIAGNOSIS — E785 Hyperlipidemia, unspecified: Secondary | ICD-10-CM | POA: Diagnosis not present

## 2020-09-21 DIAGNOSIS — S81801D Unspecified open wound, right lower leg, subsequent encounter: Secondary | ICD-10-CM | POA: Diagnosis not present

## 2020-09-21 DIAGNOSIS — J810 Acute pulmonary edema: Secondary | ICD-10-CM | POA: Diagnosis not present

## 2020-09-21 DIAGNOSIS — R278 Other lack of coordination: Secondary | ICD-10-CM | POA: Diagnosis not present

## 2020-09-21 DIAGNOSIS — R531 Weakness: Secondary | ICD-10-CM | POA: Diagnosis not present

## 2020-09-21 DIAGNOSIS — R2689 Other abnormalities of gait and mobility: Secondary | ICD-10-CM | POA: Diagnosis not present

## 2020-09-21 DIAGNOSIS — N1832 Chronic kidney disease, stage 3b: Secondary | ICD-10-CM | POA: Diagnosis not present

## 2020-09-21 DIAGNOSIS — N179 Acute kidney failure, unspecified: Secondary | ICD-10-CM | POA: Diagnosis not present

## 2020-09-21 DIAGNOSIS — E46 Unspecified protein-calorie malnutrition: Secondary | ICD-10-CM | POA: Diagnosis not present

## 2020-09-21 DIAGNOSIS — R0902 Hypoxemia: Secondary | ICD-10-CM | POA: Diagnosis not present

## 2020-09-21 LAB — BASIC METABOLIC PANEL
Anion gap: 6 (ref 5–15)
BUN: 34 mg/dL — ABNORMAL HIGH (ref 8–23)
CO2: 28 mmol/L (ref 22–32)
Calcium: 8.1 mg/dL — ABNORMAL LOW (ref 8.9–10.3)
Chloride: 100 mmol/L (ref 98–111)
Creatinine, Ser: 1.53 mg/dL — ABNORMAL HIGH (ref 0.61–1.24)
GFR, Estimated: 43 mL/min — ABNORMAL LOW (ref >60)
Glucose, Bld: 98 mg/dL (ref 70–99)
Potassium: 5.1 mmol/L (ref 3.5–5.1)
Sodium: 134 mmol/L — ABNORMAL LOW (ref 135–145)

## 2020-09-21 LAB — CBC
HCT: 44.2 % (ref 39.0–52.0)
Hemoglobin: 14.8 g/dL (ref 13.0–17.0)
MCH: 32 pg (ref 26.0–34.0)
MCHC: 33.5 g/dL (ref 30.0–36.0)
MCV: 95.7 fL (ref 80.0–100.0)
Platelets: 196 10*3/uL (ref 150–400)
RBC: 4.62 MIL/uL (ref 4.22–5.81)
RDW: 14.6 % (ref 11.5–15.5)
WBC: 10.3 10*3/uL (ref 4.0–10.5)
nRBC: 0.2 % (ref 0.0–0.2)

## 2020-09-21 NOTE — Care Management Important Message (Signed)
Important Message  Patient Details  Name: David Bradshaw Sr. MRN: [id number] Date of Birth: Jan 27, 1927   Medicare Important Message Given:  Yes     Shelda Altes 09/21/2020, 8:39 AM

## 2020-09-21 NOTE — TOC Transition Note (Addendum)
Transition of Care [facility]) - CM/SW Discharge Note   Patient Details  Name: David BOZZO Sr. MRN: [id number] Date of Birth: Jul 31, 1926  Transition of Care [facility] CM/SW Contact:  Tresa Endo Phone Number: 09/21/2020, 9:26 AM   Clinical Narrative:    Patient will DC to: [facility] Anticipated DC date: 09/21/2020 Family notified: Pt Wife Transport by: Corey Harold   Per MD patient ready for DC to [facility] Room B13-1 . RN to call report prior to discharge ([phone]) ask for Laraine Cox. RN, patient, patient's family, and facility notified of DC. Discharge Summary and FL2 sent to facility. DC packet on chart. Ambulance transport requested for patient.   CSW will sign off for now as social work intervention is no longer needed. Please consult [place] again if new needs arise.        Barriers to Discharge: Continued Medical Work up,Insurance Authorization,SNF Pending bed offer   Patient Goals and CMS Choice   CMS Medicare.gov Compare Post Acute Care list provided to:: Patient    Discharge Placement                       Discharge Plan and Services                                     Social Determinants of Health (SDOH) Interventions     Readmission Risk Interventions No flowsheet data found.

## 2020-09-21 NOTE — Progress Notes (Signed)
Physical Therapy Treatment Patient Details Name: David MCNEILL Sr. MRN: [id number] DOB: 02-15-1927 Today's Date: 09/21/2020    History of Present Illness 87 y.o. male presents to [facility] ED on 09/13/2020 with SOB and fall. Pt admitted with ascites, volume overload, and R foot wounds. Past medical history significant of CVA; CAD; cirrhosis with hepatocellular CA; and carotid and aortic stenosis.    PT Comments    Patient reports feeling fair today with no new complaints, continues to await insurance authorization for SNF placement. Noted to have worsening balance today during gait training needing Mod A to prevent fall/LOB. Pt with increased speed, impulsivity and forward momentum needing to reach out to grab rails/furniture for balance and difficulty slowing down. Poor awareness of deficits/safety despite cues. Sp02 dropped to 84% on RA with activity and took ~2 minutes to rebound to 89%. Declined sitting in chair or using RW. Mobility tech notified that pt could benefit from being seen due to down grade in mobility. Will follow.    Follow Up Recommendations  SNF;Supervision for mobility/OOB     Equipment Recommendations  None recommended by PT    Recommendations for Other Services       Precautions / Restrictions Precautions Precautions: Fall;Other (comment) Precaution Comments: watch BP, 02 Restrictions Weight Bearing Restrictions: No    Mobility  Bed Mobility Overal bed mobility: Needs Assistance Bed Mobility: Supine to Sit;Sit to Supine     Supine to sit: Supervision;HOB elevated Sit to supine: Supervision;HOB elevated   General bed mobility comments: minimally impulsive, requiring cues for pacing to ensure safety    Transfers Overall transfer level: Needs assistance Equipment used: None Transfers: Sit to/from Stand Sit to Stand: Min guard         General transfer comment: Min guard for safety; impulsive initially.  Ambulation/Gait Ambulation/Gait assistance: Mod  assist Gait Distance (Feet): 100 Feet Assistive device: None Gait Pattern/deviations: Step-through pattern;Decreased stride length;Trunk flexed;Staggering left;Staggering right;Narrow base of support Gait velocity: too fast;unsafe gait speed   General Gait Details: Fast, unsteady gait with increased forward momentum with pt difficulty slowing down reaching out for rail/furniture for support needing Mod A to prevent LOB/fall. Sp02 dropped to 84% on RA with ambulation, rebounds within 2 minutes to 89%. Declines using RW.   Stairs             Wheelchair Mobility    Modified Rankin (Stroke Patients Only)       Balance Overall balance assessment: Needs assistance Sitting-balance support: Feet supported;No upper extremity supported Sitting balance-Leahy Scale: Good     Standing balance support: During functional activity Standing balance-Leahy Scale: Fair Standing balance comment: Requires external support for walking due to imbalance, able to stand statically without support.                            Cognition Arousal/Alertness: Awake/alert Behavior During Therapy: WFL for tasks assessed/performed Overall Cognitive Status: Within Functional Limits for tasks assessed                                 General Comments: Poor awareness of deficits/safety. "I am fine"      Exercises      General Comments General comments (skin integrity, edema, etc.): Sp02 ranged from 84-89% on RA with activity.      Pertinent Vitals/Pain Pain Assessment: No/denies pain    Home Living  Prior Function            PT Goals (current goals can now be found in the care plan section) Progress towards PT goals: Progressing toward goals (slowly)    Frequency    Min 2X/week      PT Plan Current plan remains appropriate    Co-evaluation              AM-PAC PT "6 Clicks" Mobility   Outcome Measure  Help needed turning  from your back to your side while in a flat bed without using bedrails?: A Little Help needed moving from lying on your back to sitting on the side of a flat bed without using bedrails?: A Little Help needed moving to and from a bed to a chair (including a wheelchair)?: A Little Help needed standing up from a chair using your arms (e.g., wheelchair or bedside chair)?: A Little Help needed to walk in hospital room?: A Lot Help needed climbing 3-5 steps with a railing? : A Lot 6 Click Score: 16    End of Session Equipment Utilized During Treatment: Gait belt Activity Tolerance: Patient limited by fatigue Patient left: in bed;with call bell/phone within reach;with bed alarm set Nurse Communication: Mobility status PT Visit Diagnosis: Unsteadiness on feet (R26.81);Muscle weakness (generalized) (M62.81)     Time: 1308-6578 PT Time Calculation (min) (ACUTE ONLY): 12 min  Charges:  $Gait Training: 8-22 mins                     Marisa Severin, PT, DPT Acute Rehabilitation Services Pager [phone] Office [place] 09/21/2020, 11:54 AM

## 2020-09-21 NOTE — Plan of Care (Signed)
  Problem: Acute Rehab PT Goals(only PT should resolve) Goal: Pt Will Go Supine/Side To Sit Outcome: Adequate for Discharge Goal: Patient Will Transfer Sit To/From Stand Outcome: Adequate for Discharge Goal: Pt Will Transfer Bed To Chair/Chair To Bed Outcome: Adequate for Discharge Goal: Pt Will Perform Standing Balance Or Pre-Gait Outcome: Adequate for Discharge Goal: Pt Will Ambulate Outcome: Adequate for Discharge Goal: Pt Will Go Up/Down Stairs Outcome: Adequate for Discharge   Problem: Acute Rehab OT Goals (only OT should resolve) Goal: Pt. Will Perform Grooming Outcome: Adequate for Discharge Goal: Pt. Will Perform Lower Body Bathing Outcome: Adequate for Discharge Goal: Pt. Will Perform Lower Body Dressing Outcome: Adequate for Discharge Goal: Pt. Will Transfer To Toilet Outcome: Adequate for Discharge Goal: Pt. Will Perform Toileting-Clothing Manipulation Outcome: Adequate for Discharge

## 2020-09-21 NOTE — Discharge Summary (Signed)
Physician Discharge Summary   Patient ID: David BIEBEL Sr. MRN: [id number] DOB/AGE: [birth year] 87 y.o.  Admit date: 09/13/2020 Discharge date: 09/21/2020  Primary Care Physician:  Pleas Koch, NP   Recommendations for Outpatient Follow-up:  1. Follow up with PCP in 1-2 weeks Please obtain BMP/CBC in one week    Home Health: Patient going to skilled nursing facility Equipment/Devices:   Discharge Condition: stable CODE STATUS:  DNR   Diet recommendation: Low-sodium diet   Discharge Diagnoses:    . Decompensated cirrhosis of liver with ascites (HCC) Known history of hepatocellular carcinoma . Acute kidney injury superimposed on CKD 3B ([place]) . Foot ulcer, right, limited to breakdown of skin ([place]) Generalized debility Moderate protein calorie malnutrition . DNR (do not resuscitate)   Consults:  Gastroenterology   Allergies:   Allergies  Allergen Reactions  . Tape Other (See Comments)    SKIN IS VERY THIN- WILL TEAR EASILY!! PLEASE USE PAPER TAPE  . Atorvastatin Rash and Other (See Comments)    Patient started on Plavix and Lipitor at the same time and developed a rash  . Brilinta [Ticagrelor] Hives  . Plavix [Clopidogrel Bisulfate] Rash and Other (See Comments)    Patient started on plavix and lipitor at the same time and developed a rash     DISCHARGE MEDICATIONS: Allergies as of 09/21/2020      Reactions   Tape Other (See Comments)   SKIN IS VERY THIN- WILL TEAR EASILY!! PLEASE USE PAPER TAPE   Atorvastatin Rash, Other (See Comments)   Patient started on Plavix and Lipitor at the same time and developed a rash   Brilinta [ticagrelor] Hives   Plavix [clopidogrel Bisulfate] Rash, Other (See Comments)   Patient started on plavix and lipitor at the same time and developed a rash      Medication List    STOP taking these medications   aspirin EC 325 MG tablet     TAKE these medications   furosemide 20 MG tablet Commonly known as: LASIX Take 1  tablet (20 mg total) by mouth daily. For leg swelling. What changed:   when to take this  additional instructions   ICAPS AREDS FORMULA PO Take 1 capsule by mouth 2 (two) times daily.   midodrine 5 MG tablet Commonly known as: PROAMATINE Take 1 tablet (5 mg total) by mouth 2 (two) times daily with a meal.   omeprazole 40 MG capsule Commonly known as: PRILOSEC Take 1 capsule (40 mg total) by mouth 2 (two) times daily before a meal.   spironolactone 50 MG tablet Commonly known as: ALDACTONE Take 1 tablet (50 mg total) by mouth daily. What changed: when to take this            Discharge Care Instructions  (From admission, onward)         Start     Ordered   09/21/20 0000  Discharge wound care:       Comments: Wash wound on the right lateral foot close to the fifth toe with soap and water.  Pat it dry.  Place a small foam dressing over it.  Evaluate the area daily, change the dressing every 3 days and as needed   09/21/20 0906           Brief H and P: For complete details please refer to admission H and P, but in brief87 year old with history of stroke, coronary artery disease,NASHcirrhosis and recently diagnosed hepatocellular carcinoma on surveillancepresented to the hospital with  progressive shortness of breath, weakness and fall. Patient also reported bloating for last few months. More than 10 pound weight gain in 1 month. Difficulty walking around. Lower extremity edema. Lost balance and fell back when he was trying to get to the car. Recent echocardiogram with ejection fraction 55%.  Hospital Course:   Cirrhosis of liver with ascites/decompensated liver cirrhosis. Known hepatocellular carcinoma: -Presented with decompensated liver cirrhosis with weight gain, bloating, lower extremity edema, difficulty ambulating.  Symptoms difficult to control at home with conservative management and oral diuresis. -Patient was placed on IV Lasix and continued on  Aldactone. -Underwent paracentesis, 3.4 L removed. -However with high-dose diuretics, patient had significant orthostatic drop and BP and dizziness and not tolerating escalating doses of diuretics -Continue Lasix 20 mg daily, Aldactone 50 mg daily..  -Midodrine was added, 5 mg 3 times daily with improvement in orthostatic symptoms.  -Patient was seen by gastroenterology, no new recommendations at this time.  He has a ERCP planned as outpatient. PT recommended skilled nursing facility  Acute kidney injury on CKD stage IIIb Baseline creatinine 1.3-1.4.  Presented with creatinine of 1.9 -Currently improved, creatinine 1.5, not a candidate for hemodialysis  Right leg wound -Seen by wound care, continue local wound care, instructions below  Generalized debility, ambulatory dysfunction -Lives at home alone, patient was followed by palliative care team. -PT recommended skilled nursing facility for inpatient therapies before returning home   Moderate protein calorie malnutrition in the context of chronic illness, malignancy and acute decompensated cirrhosis. Estimated body mass index is 30.35 kg/m as calculated from the following:   Height as of this encounter: 5' (1.524 m).   Weight as of this encounter: 70.5 kg.   Day of Discharge S: No acute complaints, working with PT.  Awaiting skilled nursing facility  BP 93/73 (BP Location: Left Arm)   Pulse 65   Temp 98.5 F (36.9 C) (Oral)   Resp 17   Ht 5' (1.524 m)   Wt 70.2 kg   SpO2 90%   BMI 30.23 kg/m   Physical Exam: General: Alert and awake oriented x3 not in any acute distress. CVS: S1-S2 clear no murmur rubs or gallops Chest: clear to auscultation bilaterally, no wheezing rales or rhonchi Abdomen: soft nontender, nondistended, normal bowel sounds Extremities: no cyanosis, clubbing, 2+ edema noted bilaterally Neuro: Cranial nerves II-XII intact, no focal neurological deficits    Get Medicines reviewed and  adjusted: Please take all your medications with you for your next visit with your Primary MD  Please request your Primary MD to go over all hospital tests and procedure/radiological results at the follow up. Please ask your Primary MD to get all Hospital records sent to his/her office.  If you experience worsening of your admission symptoms, develop shortness of breath, life threatening emergency, suicidal or homicidal thoughts you must seek medical attention immediately by calling 911 or calling your MD immediately  if symptoms less severe.  You must read complete instructions/literature along with all the possible adverse reactions/side effects for all the Medicines you take and that have been prescribed to you. Take any new Medicines after you have completely understood and accept all the possible adverse reactions/side effects.   Do not drive when taking pain medications.   Do not take more than prescribed Pain, Sleep and Anxiety Medications  Special Instructions: If you have smoked or chewed Tobacco  in the last 2 yrs please stop smoking, stop any regular Alcohol  and or any Recreational drug use.  Wear Seat belts while driving.  Please note  You were cared for by a hospitalist during your hospital stay. Once you are discharged, your primary care physician will handle any further medical issues. Please note that NO REFILLS for any discharge medications will be authorized once you are discharged, as it is imperative that you return to your primary care physician (or establish a relationship with a primary care physician if you do not have one) for your aftercare needs so that they can reassess your need for medications and monitor your lab values.   The results of significant diagnostics from this hospitalization (including imaging, microbiology, ancillary and laboratory) are listed below for reference.      Procedures/Studies:  CT Head Wo Contrast  Result Date: 09/13/2020 CLINICAL  DATA:  Pain following fall EXAM: CT HEAD WITHOUT CONTRAST TECHNIQUE: Contiguous axial images were obtained from the base of the skull through the vertex without intravenous contrast. COMPARISON:  January 30, 2016 head CT and brain MRI. FINDINGS: Brain: Moderate diffuse atrophy is stable. There is no intracranial mass, hemorrhage, extra-axial fluid collection, or midline shift. There is evidence of a prior small infarct in the posterior mid left cerebellum, stable. There is evidence of a prior infarct in the right mid parietal lobe posteriorly, stable. There is decreased attenuation in periventricular white matter consistent with periventricular small vessel disease. No acute infarct is evident. Vascular: No hyperdense vessel. Calcification noted in each carotid siphon region. Skull: Bony calvarium appears intact. Stable 7 mm left frontal dural calcification, a likely small enostosis. No surrounding edema. This is a finding of no clinical significance. Sinuses/Orbits: There is slight mucosal thickening in several ethmoid air cells. Other visualized paranasal sinuses are clear. Orbits appear symmetric bilaterally. Other: Mastoid air cells are clear. IMPRESSION: Atrophy with periventricular small vessel disease. Prior infarct in the mid to posterior right parietal lobe and in the posterior mid left cerebellum. No acute infarct appreciable. No mass or hemorrhage. There are foci of arterial vascular calcification. There is mucosal thickening in several ethmoid air cells. Electronically Signed   By: Lowella Grip III M.D.   On: 09/13/2020 15:11   US Abdomen Limited  Result Date: 09/14/2020 CLINICAL DATA:  87 year old with abdominal ascites. History of hepatocellular carcinoma. EXAM: ULTRASOUND ABDOMEN LIMITED RIGHT UPPER QUADRANT COMPARISON:  MRI 07/05/2020 FINDINGS: Gallbladder: Not visualized. Common bile duct: Diameter: 4 mm Liver: Liver is severely nodular and compatible with cirrhosis. Liver parenchyma is  mildly heterogeneous. Large amount of perihepatic ascites. Patient has a known right hepatic lesion that is not clearly identified on this examination. Portal vein is patent on color Doppler imaging with normal direction of blood flow towards the liver. Other: Minimal fluid in the right lower quadrant. Large amount of ascites in the left upper quadrant. Large amount of ascites in left lower quadrant. IMPRESSION: 1. Cirrhotic liver with a large amount of ascites. 2. Gallbladder is not visualized. 3. Known hepatic lesion is not identified on this examination. Electronically Signed   By: Markus Daft M.D.   On: 09/14/2020 16:54   DG Chest Port 1 View  Result Date: 09/13/2020 CLINICAL DATA:  Shortness of breath. EXAM: PORTABLE CHEST 1 VIEW COMPARISON:  Chest x-ray dated June 06, 2020. FINDINGS: The heart size and mediastinal contours are within normal limits. Normal pulmonary vascularity. Emphysematous changes again noted. Increasing small left pleural effusion with left basilar opacity. Unchanged scarring at the right lung base. No pneumothorax. No acute osseous abnormality. IMPRESSION: 1. Increasing small left pleural  effusion with left basilar atelectasis versus infiltrate. Electronically Signed   By: Titus Dubin M.D.   On: 09/13/2020 13:56   ECHOCARDIOGRAM COMPLETE  Result Date: 08/27/2020    ECHOCARDIOGRAM REPORT   Patient Name:   ANTUAN LIMES Sr. Date of Exam: 08/27/2020 Medical Rec #:  [id number]           Height:       60.0 in Accession #:    [id number]          Weight:       164.0 lb Date of Birth:  08/28/1926          BSA:          1.716 m Patient Age:    79 years            BP:           110/60 mmHg Patient Gender: M                   HR:           99 bpm. Exam Location:  [address] Procedure: 2D Echo, Cardiac Doppler and Color Doppler Indications:    R06.00 Dyspnea  History:        Patient has prior history of Echocardiogram examinations, most                 recent 01/31/2016. Stroke.  Edema. Myocardial Infarction.  Sonographer:    Cresenciano Lick RDCS Referring Phys: [id number] [place]  1. Limited study due to poor acoustic windows and lack of definity contrast. Left ventricular ejection fraction, by estimation, is 50 to 55%. The left ventricle has low normal function. The left ventricle has no regional wall motion abnormalities. There  is mild concentric left ventricular hypertrophy. Left ventricular diastolic parameters are indeterminate.  2. Right ventricular systolic function is normal. The right ventricular size is normal.  3. The mitral valve is degenerative. Trivial mitral valve regurgitation.  4. The aortic valve is not well visualized, however, there appears to be mild-to-moderate aortic stenosis base on doppler interrogation with AVA 1.3cm2, mean gradient 13mmHg, peak gradient 28mmHg, Vmax 2.47m/s. Notably the LVOT VTI is an incomplete signal and therefore the AVA may underestimate true valve area.  5. There is mild aortic regurgitation. Comparison(s): Compared to prior echo report in 2017, the LVEF now appears to be 50-55%. Degree of aortic stenosis is unchanged (previous mean gradient in 2017 was 45mmHg). FINDINGS  Left Ventricle: Limited study due to poor acoustic windows and lack of definity contrast. Left ventricular ejection fraction, by estimation, is 50 to 55%. The left ventricle has low normal function. The left ventricle has no regional wall motion abnormalities. The left ventricular internal cavity size was normal in size. There is mild concentric left ventricular hypertrophy. Left ventricular diastolic parameters are indeterminate. Right Ventricle: The right ventricular size is normal. No increase in right ventricular wall thickness. Right ventricular systolic function is normal. Left Atrium: Left atrial size was normal in size. Right Atrium: Right atrial size was normal in size. Pericardium: There is no evidence of pericardial effusion. Mitral  Valve: The mitral valve is degenerative in appearance. There is mild thickening of the mitral valve leaflet(s). There is mild calcification of the mitral valve leaflet(s). Mild to moderate mitral annular calcification. Trivial mitral valve regurgitation. Tricuspid Valve: The tricuspid valve is normal in structure. Tricuspid valve regurgitation is trivial. Aortic Valve: Aortic valve is not well visualized, however, there appears to  be mild-to-moderate aortic stenosis with AVA 1.1cm2, mean gradient 29mmHg, peak gradient 95mmHg, Vmax 2.57m/s. Notably the LVOT VTI is an incomplete signal and therefore the AVA may underestimate true valve area. The aortic valve was not well visualized. Aortic valve regurgitation is mild. Aortic regurgitation PHT measures 275 msec. Mild to moderate aortic stenosis is present. Aortic valve mean gradient measures 18.0 mmHg. Aortic valve peak gradient measures 28.0 mmHg. Aortic valve area, by VTI measures 1.35 cm. Pulmonic Valve: The pulmonic valve was not well visualized. Pulmonic valve regurgitation is not visualized. Aorta: The aortic root is normal in size and structure. IAS/Shunts: No atrial level shunt detected by color flow Doppler.  LEFT VENTRICLE PLAX 2D LVIDd:         4.50 cm  Diastology LVIDs:         3.50 cm  LV e' medial:    3.48 cm/s LV PW:         1.00 cm  LV E/e' medial:  12.4 LV IVS:        1.00 cm  LV e' lateral:   4.03 cm/s LVOT diam:     2.20 cm  LV E/e' lateral: 10.7 LV SV:         61 LV SV Index:   36 LVOT Area:     3.80 cm  RIGHT VENTRICLE RV Basal diam:  2.90 cm RV S prime:     9.70 cm/s TAPSE (M-mode): 2.1 cm LEFT ATRIUM             Index       RIGHT ATRIUM          Index LA diam:        4.00 cm 2.33 cm/m  RA Area:     7.03 cm LA Vol (A2C):   24.4 ml 14.22 ml/m RA Volume:   11.80 ml 6.88 ml/m LA Vol (A4C):   19.2 ml 11.19 ml/m LA Biplane Vol: 23.0 ml 13.41 ml/m  AORTIC VALVE AV Area (Vmax):    0.82 cm AV Area (Vmean):   0.86 cm AV Area (VTI):     1.35 cm AV  Vmax:           264.50 cm/s AV Vmean:          203.750 cm/s AV VTI:            0.452 m AV Peak Grad:      28.0 mmHg AV Mean Grad:      18.0 mmHg LVOT Vmax:         57.05 cm/s LVOT Vmean:        45.950 cm/s LVOT VTI:          0.161 m LVOT/AV VTI ratio: 0.36 AI PHT:            275 msec  AORTA Ao Root diam: 3.50 cm MV E velocity: 43.20 cm/s MV A velocity: 110.00 cm/s  SHUNTS MV E/A ratio:  0.39         Systemic VTI:  0.16 m                             Systemic Diam: 2.20 cm Gwyndolyn Kaufman MD Electronically signed by Gwyndolyn Kaufman MD Signature Date/Time: 08/27/2020/4:22:05 PM    Final    IR Paracentesis  Result Date: 09/14/2020 INDICATION: Patient with history of cirrhosis found to have ascites. Request is made for therapeutic paracentesis. EXAM: ULTRASOUND GUIDED THERAPEUTIC PARACENTESIS MEDICATIONS: 10  mL 1% lidocaine COMPLICATIONS: None immediate. PROCEDURE: Informed written consent was obtained from the patient after a discussion of the risks, benefits and alternatives to treatment. A timeout was performed prior to the initiation of the procedure. Initial ultrasound scanning demonstrates a moderate amount of ascites within the left lateral abdomen. The left lateral abdomen was prepped and draped in the usual sterile fashion. 1% lidocaine was used for local anesthesia. Following this, a 19 gauge, 7-cm, Yueh catheter was introduced. An ultrasound image was saved for documentation purposes. The paracentesis was performed. The catheter was removed and a dressing was applied. The patient tolerated the procedure well without immediate post procedural complication. FINDINGS: A total of approximately 3.4 liters of yellow fluid was removed. Procedure was stopped prior to removal of fluid due to patient developing asymptomatic hypotension. IMPRESSION: Successful ultrasound-guided paracentesis yielding 3.4 liters of peritoneal fluid. Read by: Brynda Greathouse PA-C Electronically Signed   By: Jacqulynn Cadet M.D.   On:  09/14/2020 17:18      LAB RESULTS: Basic Metabolic Panel: Recent Labs  Lab 09/16/20 0327 09/21/20 0143  NA 138 134*  K 4.3 5.1  CL 103 100  CO2 27 28  GLUCOSE 110* 98  BUN 31* 34*  CREATININE 1.59* 1.53*  CALCIUM 7.9* 8.1*  MG 2.1  --   PHOS 2.8  --    Liver Function Tests: Recent Labs  Lab 09/16/20 0327  AST 33  ALT 23  ALKPHOS 248*  BILITOT 1.1  PROT 5.0*  ALBUMIN 2.2*   No results for input(s): LIPASE, AMYLASE in the last 168 hours. No results for input(s): AMMONIA in the last 168 hours. CBC: Recent Labs  Lab 09/16/20 0327 09/21/20 0143  WBC 8.9 10.3  NEUTROABS 5.7  --   HGB 15.2 14.8  HCT 45.3 44.2  MCV 95.2 95.7  PLT 144* 196   Cardiac Enzymes: No results for input(s): CKTOTAL, CKMB, CKMBINDEX, TROPONINI in the last 168 hours. BNP: Invalid input(s): POCBNP CBG: No results for input(s): GLUCAP in the last 168 hours.     Disposition and Follow-up: Discharge Instructions    Call MD for:  difficulty breathing, headache or visual disturbances   Complete by: As directed    Call MD for:  persistant dizziness or light-headedness   Complete by: As directed    Diet - low sodium heart healthy   Complete by: As directed    Discharge wound care:   Complete by: As directed    Wash wound on the right lateral foot close to the fifth toe with soap and water.  Pat it dry.  Place a small foam dressing over it.  Evaluate the area daily, change the dressing every 3 days and as needed   Increase activity slowly   Complete by: As directed        DISPOSITION: [facility] information for follow-up providers    Pleas Koch, NP Follow up in 2 week(s).   Specialty: Internal Medicine Contact information: [place] [address] [phone]            Contact information for after-discharge care    [facility] .   Service: Skilled  Nursing Contact information: [address] [place] [phone]                   Time coordinating discharge:  35 minutes  Signed:  Estill Cotta M.D. [facility] 09/21/2020, 9:10 AM

## 2020-09-21 NOTE — TOC Progression Note (Addendum)
Transition of Care (TOC) - Progression Note    Patient Details  Name: David CALVIN Sr. MRN: [id number] Date of Birth: July 08, 1926  Transition of Care [facility]) CM/SW Contact  Reece Agar, [place] Phone Number: 09/21/2020, 9:28 AM  Clinical Narrative:    6:96EX- Debbie with [facility] confirmed insurance auth and is ready for pt, she does not need another covid bc pt will be tested at facility upon arrival. CSW contacted pt son, there was no answer CSW will follow up.  10:33am- Pt wife returned CSW call. CSW updated family on DC and [facility] being contacted to transport pt. Pt son will go to [facility] to fill out paperwork.   Expected Discharge Plan: Skilled Nursing Facility Barriers to Discharge: Continued Medical Work up,Insurance Authorization,SNF Pending bed offer  Expected Discharge Plan and Services Expected Discharge Plan: [place] arrangements for the past 2 months: Single Family Home Expected Discharge Date: 09/21/20                                     Social Determinants of Health (SDOH) Interventions    Readmission Risk Interventions No flowsheet data found.

## 2020-09-21 NOTE — Progress Notes (Signed)
D/C instructions printed and placed in packet at nurse's station. Tele and IV removed, tolerated well. Awaiting [facility].

## 2020-09-24 DIAGNOSIS — I509 Heart failure, unspecified: Secondary | ICD-10-CM | POA: Diagnosis not present

## 2020-09-24 DIAGNOSIS — E785 Hyperlipidemia, unspecified: Secondary | ICD-10-CM | POA: Diagnosis not present

## 2020-09-24 DIAGNOSIS — S81801D Unspecified open wound, right lower leg, subsequent encounter: Secondary | ICD-10-CM | POA: Diagnosis not present

## 2020-09-24 DIAGNOSIS — N1832 Chronic kidney disease, stage 3b: Secondary | ICD-10-CM | POA: Diagnosis not present

## 2020-09-24 DIAGNOSIS — K7460 Unspecified cirrhosis of liver: Secondary | ICD-10-CM | POA: Diagnosis not present

## 2020-09-24 DIAGNOSIS — C229 Malignant neoplasm of liver, not specified as primary or secondary: Secondary | ICD-10-CM | POA: Diagnosis not present

## 2020-09-25 DIAGNOSIS — K7460 Unspecified cirrhosis of liver: Secondary | ICD-10-CM | POA: Diagnosis not present

## 2020-09-25 DIAGNOSIS — C229 Malignant neoplasm of liver, not specified as primary or secondary: Secondary | ICD-10-CM | POA: Diagnosis not present

## 2020-09-27 ENCOUNTER — Telehealth: Payer: Self-pay | Admitting: Radiation Oncology

## 2020-09-27 NOTE — Telephone Encounter (Signed)
Lum Babe called about his father's MRI appointment, but I don't show an MRI scheduled yet. He is in Rehab right now and he will be discharged on 4/28. Sending a message to Romie Jumper to  reschedule his f/u with the MRI after 4/28?   They were expecting the MRI and f/u to be at the end of May/beginning June.   Kevin's number is [phone]

## 2020-09-28 ENCOUNTER — Telehealth: Payer: Self-pay | Admitting: *Deleted

## 2020-09-28 NOTE — Telephone Encounter (Signed)
Returned patient's phone call, lvm for a return call 

## 2020-10-03 ENCOUNTER — Telehealth: Payer: Self-pay | Admitting: Gastroenterology

## 2020-10-03 DIAGNOSIS — R600 Localized edema: Secondary | ICD-10-CM

## 2020-10-03 NOTE — Telephone Encounter (Signed)
Inbound call from patient son, [name]. States patient is currently in rehab but will be home Friday and would need refill. Furosemide 20mg , Spironolactone 50mg , Omeprazole 40mg , and Midodrine 5mg . Pharmacy [facility] [place]

## 2020-10-03 NOTE — Telephone Encounter (Signed)
Dr. Havery Moros - please see note from patient's son. Patient will be released from rehab on Friday and is requesting refills. Please advise

## 2020-10-04 NOTE — Telephone Encounter (Signed)
Jan we can refill the lasix 20mg , aldactone 50mg , and omeprazole 40mg .  I haven't prescribed him midodrine, that may be coming from his PCP, he should touch base with them about that. If he needs an emergency or short term refill I can help with it, but he has been getting that elsewhere. Thanks

## 2020-10-05 MED ORDER — FUROSEMIDE 20 MG PO TABS: 20.0000 mg | ORAL_TABLET | Freq: Every day | ORAL | 2 refills | Status: DC

## 2020-10-05 MED ORDER — SPIRONOLACTONE 50 MG PO TABS: 50.0000 mg | ORAL_TABLET | Freq: Every day | ORAL | 2 refills | Status: DC

## 2020-10-05 MED ORDER — OMEPRAZOLE 40 MG PO CPDR: 40.0000 mg | DELAYED_RELEASE_CAPSULE | Freq: Two times a day (BID) | ORAL | 5 refills | Status: AC

## 2020-10-05 NOTE — Telephone Encounter (Signed)
Sent refills on lasix, aldactone and [facility] [place]. LM for [name], Patient's son that he should contact PCP for refills of midodrine.

## 2020-10-08 ENCOUNTER — Ambulatory Visit: Payer: Self-pay | Admitting: Radiation Oncology

## 2020-10-11 DIAGNOSIS — K7460 Unspecified cirrhosis of liver: Secondary | ICD-10-CM | POA: Diagnosis not present

## 2020-10-11 DIAGNOSIS — I509 Heart failure, unspecified: Secondary | ICD-10-CM | POA: Diagnosis not present

## 2020-10-11 DIAGNOSIS — N1832 Chronic kidney disease, stage 3b: Secondary | ICD-10-CM | POA: Diagnosis not present

## 2020-10-11 DIAGNOSIS — S81801D Unspecified open wound, right lower leg, subsequent encounter: Secondary | ICD-10-CM | POA: Diagnosis not present

## 2020-10-11 DIAGNOSIS — C229 Malignant neoplasm of liver, not specified as primary or secondary: Secondary | ICD-10-CM | POA: Diagnosis not present

## 2020-10-11 DIAGNOSIS — E785 Hyperlipidemia, unspecified: Secondary | ICD-10-CM | POA: Diagnosis not present

## 2020-10-12 ENCOUNTER — Ambulatory Visit: Payer: Medicare HMO | Admitting: Gastroenterology

## 2020-10-15 ENCOUNTER — Encounter: Payer: Self-pay | Admitting: Gastroenterology

## 2020-10-15 ENCOUNTER — Ambulatory Visit: Payer: Medicare HMO | Admitting: Gastroenterology

## 2020-10-15 ENCOUNTER — Other Ambulatory Visit (INDEPENDENT_AMBULATORY_CARE_PROVIDER_SITE_OTHER): Payer: Medicare HMO

## 2020-10-15 VITALS — BP 100/62 | HR 92 | Ht 60.0 in | Wt 146.0 lb

## 2020-10-15 DIAGNOSIS — K219 Gastro-esophageal reflux disease without esophagitis: Secondary | ICD-10-CM | POA: Diagnosis not present

## 2020-10-15 DIAGNOSIS — K209 Esophagitis, unspecified without bleeding: Secondary | ICD-10-CM | POA: Diagnosis not present

## 2020-10-15 DIAGNOSIS — R600 Localized edema: Secondary | ICD-10-CM

## 2020-10-15 DIAGNOSIS — C220 Liver cell carcinoma: Secondary | ICD-10-CM | POA: Diagnosis not present

## 2020-10-15 DIAGNOSIS — Z23 Encounter for immunization: Secondary | ICD-10-CM | POA: Diagnosis not present

## 2020-10-15 DIAGNOSIS — R188 Other ascites: Secondary | ICD-10-CM | POA: Diagnosis not present

## 2020-10-15 DIAGNOSIS — K7460 Unspecified cirrhosis of liver: Secondary | ICD-10-CM

## 2020-10-15 LAB — COMPREHENSIVE METABOLIC PANEL
ALT: 35 U/L (ref 0–53)
AST: 44 U/L — ABNORMAL HIGH (ref 0–37)
Albumin: 3.1 g/dL — ABNORMAL LOW (ref 3.5–5.2)
Alkaline Phosphatase: 337 U/L — ABNORMAL HIGH (ref 39–117)
BUN: 27 mg/dL — ABNORMAL HIGH (ref 6–23)
CO2: 29 mEq/L (ref 19–32)
Calcium: 8.6 mg/dL (ref 8.4–10.5)
Chloride: 105 mEq/L (ref 96–112)
Creatinine, Ser: 1.56 mg/dL — ABNORMAL HIGH (ref 0.40–1.50)
GFR: 38.6 mL/min — ABNORMAL LOW (ref >60.00)
Glucose, Bld: 140 mg/dL — ABNORMAL HIGH (ref 70–99)
Potassium: 4.1 mEq/L (ref 3.5–5.1)
Sodium: 142 mEq/L (ref 135–145)
Total Bilirubin: 0.6 mg/dL (ref 0.2–1.2)
Total Protein: 6.6 g/dL (ref 6.0–8.3)

## 2020-10-15 NOTE — Progress Notes (Signed)
HPI :  87 year old male here for follow-up visit for cirrhosis, HCC, history of gastric ulcers and esophagitis.  See prior notes for details of his case.  He presented to [place] for a routine outpatient office consultation in January with jaundice.  He underwent serologic work-up and imaging.  Eventually had an MRCP showing gallstones in the gallbladder with a dilated bile duct and choledocholithiasis.  Incidentally also was noted to have evidence of cirrhosis with a small suspected HCC. He underwent elective ERCP with Dr. Rush Landmark on February 1.  He had removal of stone from the CBD and sphincterotomy performed.  The patient tolerated the procedure well. Given his age and comorbidities he has not undergone cholecystectomy, he has multiple stones noted in his gallbladder previously.  Given he had findings of [place] on MRI his case was discussed at the cancer conference.  He is not a good surgical candidate given his age, not a transplant candidate, it was recommended he be referred to IR to discuss options they can offer.  He spoke with Dr. Kathlene Cote of IR and was referred to Dr. Lisbeth Renshaw to discuss possible radiation therapy in case the lesion grows.  Their plan is interval imaging at this point time which the patient and son were agreeable with.  He has no history of alcohol use.  He denies any history of known liver disease.  His work-up has included negative testing hepatitis B and C, negative testing for autoimmune hepatitis and PBC, well celiac disease.     Incidentally noted during his EGD he had multiple gastric ulcers as well as severe esophagitis, with concern for possible underlying malignancy at the distal esophagus.  Biopsies were taken and negative for dysplasia or malignancy.  He tested negative for H. pylori.  He does take a regular aspirin every day for history of TIA.  He was placed on Protonix 40 mg twice daily.  Dr. Rush Landmark had recommended a follow-up endoscopy in light of the esophageal  findings.  The patient in the interim developed some lower extremity edema that has bothered him as well as some shortness of breath.    He had an echocardiogram in March with an EF of 50 to 55%, stable changes since 2017.  He had been on Lasix 20 mg a day and Aldactone 50 mg a day for mild edema.  He was admitted to the hospital in early April with volume overload, worsening lower extremity edema and a fall.  Son states fall was not due to syncopal episode, he simply lost his balance and fell.  He was admitted to the hospital it sounds like he had a paracentesis, though no labs were sent on the fluid to rule out SBP.  He was treated with some IV Lasix.  He had some hypotension and was given some midodrine to help with that, as well as an AKI which improved upon discharge.  He was in the hospital for 1 week and then in rehab for the past 3 weeks.  His weight was 160s when he was in the hospital and currently 146 pounds today.  Son states that the edema is much improved now than it was in the past.  He is using a walker to ambulate states his balance is better when walking with this.  The patient still lives by himself, we discussed that with the son about long-term plans should the patient worsen.  He is scheduled for an MRI in a few weeks to follow-up his [place].  He has  not had blood work done since he was discharged from the hospital.  We discussed how aggressive the patient and son wanted to be with surveillance endoscopy at this point time given his recent hospitalization and volume overload.     Prior workup: MRCP 07/05/20 - IMPRESSION: Hepatic cirrhosis. 1.6 cm subcapsular mass in the lateral aspect of right lobe, which has characteristics diagnostic for hepatocellular carcinoma. (LI-RADS Category 5: Definitely HCC) Mild biliary ductal dilatation, with 7 mm calculus in distal common bile duct. Cholelithiasis. No radiographic evidence of cholecystitis.  5.4 cm infrarenal abdominal aortic  aneurysm. Recommend follow-up CT/MR every 6 months and vascular consultation. This recommendation follows ACR consensus guidelines: White Paper of the ACR Incidental Findings Committee II on Vascular Findings. J Am Coll Radiol 2013; 10:789-794.  ERCP 07/17/20 -  - No gross lesions in esophagus proximally. - Grade I esophageal varices distally. - Congested, erythematous, friable (with spontaneous bleeding), granular, inflamed, texture changed, ulcerated mucosa in the esophagus - concern for severe esophagitis vs potential for underlying malignancy - biopsied. - J-shaped deformity in the entire stomach. - Gastritis. Non-bleeding gastric ulcers with a clean ulcer base ([place] Class III). Biopsied for HP evaluation. - Duodenitis. Non-bleeding duodenal ulcers with a clean ulcer base (Forrest Class III). Biopsied for HP evaluation. - The major papilla appeared normal. - A filling defect consistent with a stone was seen on the cholangiogram. - The entire main bile duct was moderately dilated from the stone. - Choledocholithiasis was found. Complete removal was accomplished by biliary sphincterotomy and balloon sweep.  FINAL MICROSCOPIC DIAGNOSIS:   A. DUODENUM, BIOPSY:  - Duodenal mucosa with reactive changes  - Negative for increased intraepithelial lymphocytes or villous  architectural changes   B. STOMACH, BIOPSY:  - Gastric antral and oxyntic mucosa with mild chronic gastritis  - [place] Starry stain is negative for Helicobacter pylori   C. EG JUNCTION, BIOPSY:  - Esophageal squamous and cardiac mucosa with active chronic nonspecific  cardioesophagitis  - Negative for intestinal metaplasia or dysplasia     Echo 08/27/20 - EF 50-55%, mild LVH, mild to moderate AS - unchanged since 2017   Past Medical History:  Diagnosis Date  . Aortic stenosis    mild AS 01/2016 echo  . Carotid stenosis   . Chicken pox   . Choledocholithiasis   . Cirrhosis ([place])   . Coronary  arteriosclerosis due to lipid rich plaque 01/30/2016  . DNR (do not resuscitate) 09/13/2020  . Edema   . Esophagitis   . Gastric ulcer   . Hepatocellular carcinoma ([place]) 07/05/2020  . Myocardial infarct ([place])    1988 and 1990  . Stroke [facility])      Past Surgical History:  Procedure Laterality Date  . BIOPSY  07/17/2020   Procedure: BIOPSY;  Surgeon: Rush Landmark Telford Nab., MD;  Location: [facility];  Service: Gastroenterology;;  . CARDIAC CATHETERIZATION N/A 02/04/2016   Procedure: Left Heart Cath and Coronary Angiography;  Surgeon: Jolaine Artist, MD;  Location: [facility];  Service: Cardiovascular;  Laterality: N/A;  . CAROTID ENDARTERECTOMY Right 02/14/2016  . CATARACT EXTRACTION    . ENDARTERECTOMY Right 02/14/2016   Procedure: RIGHT CAROTID ENDARTERECTOMY;  Surgeon: [name], MD;  Location: [facility];  Service: Vascular;  Laterality: Right;  . ERCP N/A 07/17/2020   Procedure: ENDOSCOPIC RETROGRADE CHOLANGIOPANCREATOGRAPHY (ERCP);  Surgeon: Irving Copas., MD;  Location: [facility];  Service: Gastroenterology;  Laterality: N/A;  . IR PARACENTESIS  09/14/2020  . IR RADIOLOGIST EVAL &  MGMT  07/27/2020  . PATCH ANGIOPLASTY Right 02/14/2016   Procedure: [place] X2;  Surgeon: [name], MD;  Location: [facility];  Service: Vascular;  Laterality: Right;  . REMOVAL OF STONES  07/17/2020   Procedure: REMOVAL OF STONES;  Surgeon: Irving Copas., MD;  Location: [facility];  Service: Gastroenterology;;  . Joan Mayans  07/17/2020   Procedure: Joan Mayans;  Surgeon: Irving Copas., MD;  Location: [facility];  Service: Gastroenterology;;  . testicle     right hydrocele   Family History  Problem Relation Age of Onset  . Heart attack Mother   . Heart disease Mother   . Bone cancer Father   . Cancer Father    Social History   Tobacco Use  . Smoking status: Former Smoker     Packs/day: 1.00    Years: 35.00    Pack years: 35.00    Quit date: 07/13/1985    Years since quitting: 35.2  . Smokeless tobacco: Never Used  Vaping Use  . Vaping Use: Never used  Substance Use Topics  . Alcohol use: No  . Drug use: No   Current Outpatient Medications  Medication Sig Dispense Refill  . furosemide (LASIX) 20 MG tablet Take 1 tablet (20 mg total) by mouth daily. For leg swelling. 30 tablet 2  . midodrine (PROAMATINE) 5 MG tablet Take 1 tablet (5 mg total) by mouth 2 (two) times daily with a meal.    . Multiple Vitamins-Minerals (ICAPS AREDS FORMULA PO) Take 1 capsule by mouth 2 (two) times daily.    Marland Kitchen omeprazole (PRILOSEC) 40 MG capsule Take 1 capsule (40 mg total) by mouth 2 (two) times daily before a meal. 60 capsule 5  . spironolactone (ALDACTONE) 50 MG tablet Take 1 tablet (50 mg total) by mouth daily. 30 tablet 2   No current facility-administered medications for this visit.   Allergies  Allergen Reactions  . Tape Other (See Comments)    SKIN IS VERY THIN- WILL TEAR EASILY!! PLEASE USE PAPER TAPE  . Atorvastatin Rash and Other (See Comments)    Patient started on Plavix and Lipitor at the same time and developed a rash  . Brilinta [Ticagrelor] Hives  . Plavix [Clopidogrel Bisulfate] Rash and Other (See Comments)    Patient started on plavix and lipitor at the same time and developed a rash     Review of Systems: All systems reviewed and negative except where noted in HPI.   Lab Results  Component Value Date   WBC 10.3 09/21/2020   HGB 14.8 09/21/2020   HCT 44.2 09/21/2020   MCV 95.7 09/21/2020   PLT 196 09/21/2020   Labs reviewed in Epic  Physical Exam: BP 100/62   Pulse 92   Ht 5' (1.524 m)   Wt 146 lb (66.2 kg)   SpO2 100%   BMI 28.51 kg/m  Constitutional: Pleasant, male in no acute distress. Sitting in wheelchair Cardiovascular: Normal rate, regular rhythm.  Pulmonary/chest: Effort normal and breath sounds normal.  Abdominal: Soft,  nondistended, nontender. here are no masses palpable.  Extremities: (+) 1 edema Neurological: Alert and oriented to person place and time. Psychiatric: Normal mood and affect. Behavior is normal.   ASSESSMENT AND PLAN: 87 year old male here for reassessment of the following:  Cirrhosis with ascites Hepatocellular carcinoma Lower extremity edema Esophagitis History of gastric ulcers  As above, patient with cryptogenic cirrhosis, perhaps fatty liver, with associated HCC.  His case has been discussed  at multidisciplinary conference and recommendation has been to monitor for now.  His AFP has risen over time.  He is due for a follow-up MRI later this month to reassess this.  Since I last saw him he was admitted to the hospital with volume overload and was aggressively diuresed, also had an AKI during that time and placed on midodrine.  He was in rehab for 3 weeks and now just got home last week, where he lives by himself.  He has been doing okay and discussed plans with the patient and his son.  I recommend he stay on a low-sodium diet and reviewed that with them, handout provided.  We will check his renal function today to make sure stable on his current diuretic dosing and adjust as needed.  We will need to keep a close eye on his volume status moving forward, I will have him follow-up with me in 1 month and asked him to follow-up with his primary care as well regarding this.  He is due for dose of Twinrix today we will give him that.  Otherwise he lives at home alone, discussed with son options if he is to get worse, son is not sure what he will do and encouraged him to start planning for that especially if he has progressive worsening of malignancy this could become an issue sooner than later.  We otherwise discussed findings on last endoscopy, he had significant inflammatory changes with nodularity, Dr. Rush Landmark had recommended a follow-up EGD to ensure no evidence of malignancy.  Initial biopsies  were negative.  I discussed this with the patient and his son, they do not feel comfortable pursuing that right now given his recent course and concerned how he would tolerate anesthesia which is reasonable.  We discussed risks of doing the exam versus risks of not doing the exam.  Hopefully there is no malignancy there, the patient is stable on PPI without dysphagia, they understand risks of foregoing endoscopy but he does not want to pursue endoscopy at this time but will reassess him at his follow-up.  Plan: - low sodium diet - continue diuretics, CMET today - Twinrix today - MRI liver scheduled for May 20th - pending renal function, may need to titrate diuretics moving forward - continue PPI, holding off on follow up endoscopy right now - patient should follow up with me in 1 month and his PCP as well. Call with questions in the interim.   Inger Cellar, MD [facility] Gastroenterology

## 2020-10-15 NOTE — Patient Instructions (Addendum)
If you are age 85 or older, your body mass index should be between 23-30. Your Body mass index is 28.51 kg/m. If this is out of the aforementioned range listed, please consider follow up with your Primary Care Provider.  If you are age 41 or younger, your body mass index should be between 19-25. Your Body mass index is 28.51 kg/m. If this is out of the aformentioned range listed, please consider follow up with your Primary Care Provider.   We are giving you a Low Sodium diet handout to follow.  We are giving you your 2nd Twinrix vaccine today to protect you from Hepatitis A and B.  You will be due a 3rd and final injection in 5 months.  We will call you when it is time to schedule a nurse visit.  Please go to the lab in the basement of our building to have lab work done as you leave today. Hit "B" for basement when you get on the elevator.  When the doors open the lab is on your left.  We will call you with the results. Thank you.  Due to recent changes in healthcare laws, you may see the results of your imaging and laboratory studies on MyChart before your provider has had a chance to review them.  We understand that in some cases there may be results that are confusing or concerning to you. Not all laboratory results come back in the same time frame and the provider may be waiting for multiple results in order to interpret others.  Please give [place] 48 hours in order for your provider to thoroughly review all the results before contacting the office for clarification of your results.   Please follow up with your Primary Care Provider.  You have a follow up appointment scheduled for Wednesday, 11-21-20 at 2:10pm with Dr. Havery Moros.  Thank you for entrusting me with your care and for choosing [facility], Dr. Leeds Cellar

## 2020-10-16 ENCOUNTER — Other Ambulatory Visit: Payer: Self-pay

## 2020-10-16 DIAGNOSIS — K209 Esophagitis, unspecified without bleeding: Secondary | ICD-10-CM

## 2020-10-16 DIAGNOSIS — R600 Localized edema: Secondary | ICD-10-CM

## 2020-10-16 DIAGNOSIS — C220 Liver cell carcinoma: Secondary | ICD-10-CM

## 2020-10-16 DIAGNOSIS — R188 Other ascites: Secondary | ICD-10-CM

## 2020-10-16 DIAGNOSIS — K7460 Unspecified cirrhosis of liver: Secondary | ICD-10-CM

## 2020-10-16 DIAGNOSIS — K219 Gastro-esophageal reflux disease without esophagitis: Secondary | ICD-10-CM

## 2020-10-17 ENCOUNTER — Telehealth: Payer: Self-pay | Admitting: *Deleted

## 2020-10-17 DIAGNOSIS — I350 Nonrheumatic aortic (valve) stenosis: Secondary | ICD-10-CM | POA: Diagnosis not present

## 2020-10-17 DIAGNOSIS — C229 Malignant neoplasm of liver, not specified as primary or secondary: Secondary | ICD-10-CM | POA: Diagnosis not present

## 2020-10-17 DIAGNOSIS — K2970 Gastritis, unspecified, without bleeding: Secondary | ICD-10-CM | POA: Diagnosis not present

## 2020-10-17 DIAGNOSIS — H538 Other visual disturbances: Secondary | ICD-10-CM | POA: Diagnosis not present

## 2020-10-17 DIAGNOSIS — R188 Other ascites: Secondary | ICD-10-CM | POA: Diagnosis not present

## 2020-10-17 DIAGNOSIS — K209 Esophagitis, unspecified without bleeding: Secondary | ICD-10-CM | POA: Diagnosis not present

## 2020-10-17 DIAGNOSIS — I714 Abdominal aortic aneurysm, without rupture: Secondary | ICD-10-CM | POA: Diagnosis not present

## 2020-10-17 DIAGNOSIS — I6529 Occlusion and stenosis of unspecified carotid artery: Secondary | ICD-10-CM | POA: Diagnosis not present

## 2020-10-17 DIAGNOSIS — K7581 Nonalcoholic steatohepatitis (NASH): Secondary | ICD-10-CM | POA: Diagnosis not present

## 2020-10-17 DIAGNOSIS — K7469 Other cirrhosis of liver: Secondary | ICD-10-CM | POA: Diagnosis not present

## 2020-10-17 NOTE — Telephone Encounter (Signed)
OK for HH as requested 

## 2020-10-17 NOTE — Telephone Encounter (Signed)
David Bradshaw PT with [facility] left a voicemail requesting verbal orders for PT. David Bradshaw stated that he would like the orders to start 10/17/20 and is requesting once a week for 1 week, twice a week for 5 weeks and once a week for 2 weeks. When calling back it is okay to leave the orders on his voicemail.

## 2020-10-18 DIAGNOSIS — C229 Malignant neoplasm of liver, not specified as primary or secondary: Secondary | ICD-10-CM | POA: Diagnosis not present

## 2020-10-18 DIAGNOSIS — K209 Esophagitis, unspecified without bleeding: Secondary | ICD-10-CM | POA: Diagnosis not present

## 2020-10-18 DIAGNOSIS — K7581 Nonalcoholic steatohepatitis (NASH): Secondary | ICD-10-CM | POA: Diagnosis not present

## 2020-10-18 DIAGNOSIS — H538 Other visual disturbances: Secondary | ICD-10-CM | POA: Diagnosis not present

## 2020-10-18 DIAGNOSIS — K2970 Gastritis, unspecified, without bleeding: Secondary | ICD-10-CM | POA: Diagnosis not present

## 2020-10-18 DIAGNOSIS — I350 Nonrheumatic aortic (valve) stenosis: Secondary | ICD-10-CM | POA: Diagnosis not present

## 2020-10-18 DIAGNOSIS — I6529 Occlusion and stenosis of unspecified carotid artery: Secondary | ICD-10-CM | POA: Diagnosis not present

## 2020-10-18 DIAGNOSIS — I714 Abdominal aortic aneurysm, without rupture: Secondary | ICD-10-CM | POA: Diagnosis not present

## 2020-10-18 DIAGNOSIS — R188 Other ascites: Secondary | ICD-10-CM | POA: Diagnosis not present

## 2020-10-18 DIAGNOSIS — K7469 Other cirrhosis of liver: Secondary | ICD-10-CM | POA: Diagnosis not present

## 2020-10-18 NOTE — Telephone Encounter (Signed)
Voice mail left with verbal orders

## 2020-10-23 DIAGNOSIS — H538 Other visual disturbances: Secondary | ICD-10-CM | POA: Diagnosis not present

## 2020-10-23 DIAGNOSIS — I6529 Occlusion and stenosis of unspecified carotid artery: Secondary | ICD-10-CM | POA: Diagnosis not present

## 2020-10-23 DIAGNOSIS — K7581 Nonalcoholic steatohepatitis (NASH): Secondary | ICD-10-CM | POA: Diagnosis not present

## 2020-10-23 DIAGNOSIS — C229 Malignant neoplasm of liver, not specified as primary or secondary: Secondary | ICD-10-CM | POA: Diagnosis not present

## 2020-10-23 DIAGNOSIS — K209 Esophagitis, unspecified without bleeding: Secondary | ICD-10-CM | POA: Diagnosis not present

## 2020-10-23 DIAGNOSIS — I714 Abdominal aortic aneurysm, without rupture: Secondary | ICD-10-CM | POA: Diagnosis not present

## 2020-10-23 DIAGNOSIS — K7469 Other cirrhosis of liver: Secondary | ICD-10-CM | POA: Diagnosis not present

## 2020-10-23 DIAGNOSIS — K2970 Gastritis, unspecified, without bleeding: Secondary | ICD-10-CM | POA: Diagnosis not present

## 2020-10-23 DIAGNOSIS — I350 Nonrheumatic aortic (valve) stenosis: Secondary | ICD-10-CM | POA: Diagnosis not present

## 2020-10-23 DIAGNOSIS — R188 Other ascites: Secondary | ICD-10-CM | POA: Diagnosis not present

## 2020-10-25 DIAGNOSIS — K7469 Other cirrhosis of liver: Secondary | ICD-10-CM | POA: Diagnosis not present

## 2020-10-25 DIAGNOSIS — K7581 Nonalcoholic steatohepatitis (NASH): Secondary | ICD-10-CM | POA: Diagnosis not present

## 2020-10-25 DIAGNOSIS — I350 Nonrheumatic aortic (valve) stenosis: Secondary | ICD-10-CM | POA: Diagnosis not present

## 2020-10-25 DIAGNOSIS — I6529 Occlusion and stenosis of unspecified carotid artery: Secondary | ICD-10-CM | POA: Diagnosis not present

## 2020-10-25 DIAGNOSIS — C229 Malignant neoplasm of liver, not specified as primary or secondary: Secondary | ICD-10-CM | POA: Diagnosis not present

## 2020-10-25 DIAGNOSIS — K2970 Gastritis, unspecified, without bleeding: Secondary | ICD-10-CM | POA: Diagnosis not present

## 2020-10-25 DIAGNOSIS — R188 Other ascites: Secondary | ICD-10-CM | POA: Diagnosis not present

## 2020-10-25 DIAGNOSIS — H538 Other visual disturbances: Secondary | ICD-10-CM | POA: Diagnosis not present

## 2020-10-25 DIAGNOSIS — I714 Abdominal aortic aneurysm, without rupture: Secondary | ICD-10-CM | POA: Diagnosis not present

## 2020-10-25 DIAGNOSIS — K209 Esophagitis, unspecified without bleeding: Secondary | ICD-10-CM | POA: Diagnosis not present

## 2020-10-29 DIAGNOSIS — R188 Other ascites: Secondary | ICD-10-CM | POA: Diagnosis not present

## 2020-10-29 DIAGNOSIS — K209 Esophagitis, unspecified without bleeding: Secondary | ICD-10-CM | POA: Diagnosis not present

## 2020-10-29 DIAGNOSIS — H538 Other visual disturbances: Secondary | ICD-10-CM | POA: Diagnosis not present

## 2020-10-29 DIAGNOSIS — K7469 Other cirrhosis of liver: Secondary | ICD-10-CM | POA: Diagnosis not present

## 2020-10-29 DIAGNOSIS — I350 Nonrheumatic aortic (valve) stenosis: Secondary | ICD-10-CM | POA: Diagnosis not present

## 2020-10-29 DIAGNOSIS — I6529 Occlusion and stenosis of unspecified carotid artery: Secondary | ICD-10-CM | POA: Diagnosis not present

## 2020-10-29 DIAGNOSIS — K2970 Gastritis, unspecified, without bleeding: Secondary | ICD-10-CM | POA: Diagnosis not present

## 2020-10-29 DIAGNOSIS — C229 Malignant neoplasm of liver, not specified as primary or secondary: Secondary | ICD-10-CM | POA: Diagnosis not present

## 2020-10-29 DIAGNOSIS — I714 Abdominal aortic aneurysm, without rupture: Secondary | ICD-10-CM | POA: Diagnosis not present

## 2020-10-29 DIAGNOSIS — K7581 Nonalcoholic steatohepatitis (NASH): Secondary | ICD-10-CM | POA: Diagnosis not present

## 2020-10-31 ENCOUNTER — Telehealth: Payer: Self-pay

## 2020-10-31 DIAGNOSIS — I350 Nonrheumatic aortic (valve) stenosis: Secondary | ICD-10-CM | POA: Diagnosis not present

## 2020-10-31 DIAGNOSIS — K209 Esophagitis, unspecified without bleeding: Secondary | ICD-10-CM | POA: Diagnosis not present

## 2020-10-31 DIAGNOSIS — I6529 Occlusion and stenosis of unspecified carotid artery: Secondary | ICD-10-CM | POA: Diagnosis not present

## 2020-10-31 DIAGNOSIS — K7460 Unspecified cirrhosis of liver: Secondary | ICD-10-CM

## 2020-10-31 DIAGNOSIS — K2970 Gastritis, unspecified, without bleeding: Secondary | ICD-10-CM | POA: Diagnosis not present

## 2020-10-31 DIAGNOSIS — C220 Liver cell carcinoma: Secondary | ICD-10-CM

## 2020-10-31 DIAGNOSIS — R188 Other ascites: Secondary | ICD-10-CM

## 2020-10-31 DIAGNOSIS — I714 Abdominal aortic aneurysm, without rupture: Secondary | ICD-10-CM | POA: Diagnosis not present

## 2020-10-31 DIAGNOSIS — K7469 Other cirrhosis of liver: Secondary | ICD-10-CM | POA: Diagnosis not present

## 2020-10-31 DIAGNOSIS — C229 Malignant neoplasm of liver, not specified as primary or secondary: Secondary | ICD-10-CM | POA: Diagnosis not present

## 2020-10-31 DIAGNOSIS — H538 Other visual disturbances: Secondary | ICD-10-CM | POA: Diagnosis not present

## 2020-10-31 DIAGNOSIS — R600 Localized edema: Secondary | ICD-10-CM

## 2020-10-31 DIAGNOSIS — K7581 Nonalcoholic steatohepatitis (NASH): Secondary | ICD-10-CM | POA: Diagnosis not present

## 2020-10-31 NOTE — Addendum Note (Signed)
Addended by: Yevette Edwards on: 10/31/2020 11:03 AM   Modules accepted: Orders

## 2020-10-31 NOTE — Telephone Encounter (Signed)
Spoke with David Bradshaw to remind him that patient is due for repeat labs at this time. No appointment is necessary. David Bradshaw is aware that the order is in epic and patient can have the labs drawn closer to home at [facility] office. David Bradshaw verbalized understanding and had no concerns at the end of the call.

## 2020-10-31 NOTE — Telephone Encounter (Signed)
-----   Message from Yevette Edwards, RN sent at 10/16/2020 10:16 AM EDT ----- Regarding: Labs Repeat BMET, order in epic.

## 2020-11-01 ENCOUNTER — Encounter: Payer: Self-pay | Admitting: Radiation Oncology

## 2020-11-01 NOTE — Progress Notes (Signed)
Patients meaning use is complete spoke with his son David Bradshaw. Patient is aware that this is a phone visit.

## 2020-11-02 ENCOUNTER — Ambulatory Visit (HOSPITAL_COMMUNITY)
Admission: RE | Admit: 2020-11-02 | Discharge: 2020-11-02 | Disposition: A | Discharge status: Home / Self Care | Payer: Medicare HMO | Source: Ambulatory Visit | Attending: Radiation Oncology | Admitting: Radiation Oncology

## 2020-11-02 ENCOUNTER — Other Ambulatory Visit (INDEPENDENT_AMBULATORY_CARE_PROVIDER_SITE_OTHER): Payer: Medicare HMO

## 2020-11-02 ENCOUNTER — Other Ambulatory Visit: Payer: Self-pay

## 2020-11-02 DIAGNOSIS — R600 Localized edema: Secondary | ICD-10-CM

## 2020-11-02 DIAGNOSIS — C220 Liver cell carcinoma: Secondary | ICD-10-CM | POA: Diagnosis not present

## 2020-11-02 DIAGNOSIS — K209 Esophagitis, unspecified without bleeding: Secondary | ICD-10-CM | POA: Diagnosis not present

## 2020-11-02 DIAGNOSIS — R188 Other ascites: Secondary | ICD-10-CM

## 2020-11-02 DIAGNOSIS — I714 Abdominal aortic aneurysm, without rupture: Secondary | ICD-10-CM | POA: Diagnosis not present

## 2020-11-02 DIAGNOSIS — K7460 Unspecified cirrhosis of liver: Secondary | ICD-10-CM | POA: Diagnosis not present

## 2020-11-02 DIAGNOSIS — I864 Gastric varices: Secondary | ICD-10-CM | POA: Diagnosis not present

## 2020-11-02 LAB — BASIC METABOLIC PANEL
BUN: 24 mg/dL — ABNORMAL HIGH (ref 6–23)
CO2: 24 mEq/L (ref 19–32)
Calcium: 8.9 mg/dL (ref 8.4–10.5)
Chloride: 100 mEq/L (ref 96–112)
Creatinine, Ser: 1.36 mg/dL (ref 0.40–1.50)
GFR: 45.49 mL/min — ABNORMAL LOW (ref >60.00)
Glucose, Bld: 95 mg/dL (ref 70–99)
Potassium: 4.8 mEq/L (ref 3.5–5.1)
Sodium: 135 mEq/L (ref 135–145)

## 2020-11-02 MED ORDER — GADOBUTROL 1 MMOL/ML IV SOLN
6.5000 mL | Freq: Once | INTRAVENOUS | Status: AC | PRN
  Administered 2020-11-02: 6.5 mL via INTRAVENOUS

## 2020-11-05 DIAGNOSIS — H538 Other visual disturbances: Secondary | ICD-10-CM | POA: Diagnosis not present

## 2020-11-05 DIAGNOSIS — R188 Other ascites: Secondary | ICD-10-CM | POA: Diagnosis not present

## 2020-11-05 DIAGNOSIS — C229 Malignant neoplasm of liver, not specified as primary or secondary: Secondary | ICD-10-CM | POA: Diagnosis not present

## 2020-11-05 DIAGNOSIS — I6529 Occlusion and stenosis of unspecified carotid artery: Secondary | ICD-10-CM | POA: Diagnosis not present

## 2020-11-05 DIAGNOSIS — K2970 Gastritis, unspecified, without bleeding: Secondary | ICD-10-CM | POA: Diagnosis not present

## 2020-11-05 DIAGNOSIS — K209 Esophagitis, unspecified without bleeding: Secondary | ICD-10-CM | POA: Diagnosis not present

## 2020-11-05 DIAGNOSIS — I714 Abdominal aortic aneurysm, without rupture: Secondary | ICD-10-CM | POA: Diagnosis not present

## 2020-11-05 DIAGNOSIS — K7469 Other cirrhosis of liver: Secondary | ICD-10-CM | POA: Diagnosis not present

## 2020-11-05 DIAGNOSIS — I350 Nonrheumatic aortic (valve) stenosis: Secondary | ICD-10-CM | POA: Diagnosis not present

## 2020-11-05 DIAGNOSIS — K7581 Nonalcoholic steatohepatitis (NASH): Secondary | ICD-10-CM | POA: Diagnosis not present

## 2020-11-06 ENCOUNTER — Ambulatory Visit
Admission: RE | Admit: 2020-11-06 | Discharge: 2020-11-06 | Disposition: A | Discharge status: Home / Self Care | Payer: Medicare HMO | Source: Ambulatory Visit | Attending: Radiation Oncology | Admitting: Radiation Oncology

## 2020-11-06 DIAGNOSIS — K7460 Unspecified cirrhosis of liver: Secondary | ICD-10-CM

## 2020-11-06 DIAGNOSIS — N183 Chronic kidney disease, stage 3 unspecified: Secondary | ICD-10-CM

## 2020-11-06 DIAGNOSIS — C220 Liver cell carcinoma: Secondary | ICD-10-CM | POA: Diagnosis not present

## 2020-11-06 NOTE — Progress Notes (Signed)
Radiation Oncology         [phone] ________________________________  Outpatient Follow Up - Conducted via telephone due to current COVID-19 concerns for limiting patient exposure  I spoke with the patient to conduct this consult visit via telephone to spare the patient unnecessary potential exposure in the healthcare setting during the current COVID-19 pandemic. The patient was notified in advance and was offered a [place] meeting to allow for face to face communication but unfortunately reported that they did not have the appropriate resources/technology to support such a visit and instead preferred to proceed with a telephone visit.  Name: David BOUDOIN Sr.        MRN: [id number]  Date of Service: 11/06/2020 DOB: 12/20/1926  MI:WOEHO, David Penna, NP  Pleas Koch, NP     REFERRING PHYSICIAN: Pleas Koch, NP   DIAGNOSIS: The primary encounter diagnosis was Cirrhosis of liver with ascites, unspecified hepatic cirrhosis type ([place]). Diagnoses of Stage 3 chronic kidney disease, unspecified whether stage 3a or 3b CKD ([place]) and Hepatocellular carcinoma ([place]) were also pertinent to this visit.   HISTORY OF PRESENT ILLNESS: David Bradshaw. is a 87 y.o. male originally evaluated at the request of Dr. Kathlene Cote for a history of hepatocellular carcinoma.  The patient originally was found to have elevations in his liver functions and imaging to work this up showed cirrhosis as well as a roughly noted 1.5 cm enhancing lesion in the subcapsular right lobe of the liver with adjacent ascites and a 1.5 cm infrarenal abdominal aortic aneurysm, and MRI of the abdomen on 07/05/2020 demonstrated imaging consistencies with an L RADS 5 lesion in the subcapsular right lobe of the liver measuring 1.6 cm, he underwent ERCP on 07/17/2020 having grade 1 distal esophageal varices and choledocholithiasis was found with sludge and a calculus removed after sphincterotomy and balloon sweep.  He was seen to  consider options of ablation versus chemoembolization, and he was not a good candidate for thermal ablation due to poor performance status and poor recovery after general anesthesia from an ERCP, his status apparently had declined significantly in the 2 weeks prior to seeing Dr. Kathlene Cote on 07/27/2020, his serum creatinine of 1.4 and GFR of 43.7 also prevented additional consideration of IV contrast.    When we last discussed his case in February 2022, the patient was offered stereotactic body radiotherapy however wished to delay treatment several months to see if he could get medically stronger.  In the interval, the patient unfortunately did get admitted to the hospital with progressive symptoms of his cirrhosis and acute on chronic renal failure.  Fortunately he was able to discharge to a skilled facility where he resided until October 11, 2020.  His son David Bradshaw requested that we delay his MRI imaging until he was a little bit more well-established at home.  He had repeat MRI of the liver on 11/02/2020, final results revealed progressive change within the known lesion in the lateral segment of the left lobe measuring 2.8 x 1.8 cm, previously 1.8 x 1.4 cm.  A new 5 mm subcapsular focus of her material hyperenhancement was seen in segment 2 of the left liver dome.  This was felt to be known contributing to the primary lesion when personally reviewed by Dr. Lisbeth Renshaw.  He is contacted by phone today to discuss these results and next steps.     PREVIOUS RADIATION THERAPY: No   PAST MEDICAL HISTORY:  Past Medical History:  Diagnosis Date  . Aortic  stenosis    mild AS 01/2016 echo  . Carotid stenosis   . Chicken pox   . Choledocholithiasis   . Cirrhosis ([place])   . Coronary arteriosclerosis due to lipid rich plaque 01/30/2016  . DNR (do not resuscitate) 09/13/2020  . Edema   . Esophagitis   . Gastric ulcer   . Hepatocellular carcinoma ([place]) 07/05/2020  . Myocardial infarct ([place])    1988 and 1990  . Stroke  [facility])        PAST SURGICAL HISTORY: Past Surgical History:  Procedure Laterality Date  . BIOPSY  07/17/2020   Procedure: BIOPSY;  Surgeon: Rush Landmark Telford Nab., MD;  Location: [facility];  Service: Gastroenterology;;  . CARDIAC CATHETERIZATION N/A 02/04/2016   Procedure: Left Heart Cath and Coronary Angiography;  Surgeon: Jolaine Artist, MD;  Location: [facility];  Service: Cardiovascular;  Laterality: N/A;  . CAROTID ENDARTERECTOMY Right 02/14/2016  . CATARACT EXTRACTION    . ENDARTERECTOMY Right 02/14/2016   Procedure: RIGHT CAROTID ENDARTERECTOMY;  Surgeon: [name], MD;  Location: [facility];  Service: Vascular;  Laterality: Right;  . ERCP N/A 07/17/2020   Procedure: ENDOSCOPIC RETROGRADE CHOLANGIOPANCREATOGRAPHY (ERCP);  Surgeon: Irving Copas., MD;  Location: [facility];  Service: Gastroenterology;  Laterality: N/A;  . IR PARACENTESIS  09/14/2020  . IR RADIOLOGIST EVAL & MGMT  07/27/2020  . PATCH ANGIOPLASTY Right 02/14/2016   Procedure: [place] X2;  Surgeon: [name], MD;  Location: [facility];  Service: Vascular;  Laterality: Right;  . REMOVAL OF STONES  07/17/2020   Procedure: REMOVAL OF STONES;  Surgeon: Irving Copas., MD;  Location: [facility];  Service: Gastroenterology;;  . Joan Mayans  07/17/2020   Procedure: Joan Mayans;  Surgeon: Irving Copas., MD;  Location: [facility];  Service: Gastroenterology;;  . testicle     right hydrocele     FAMILY HISTORY:  Family History  Problem Relation Age of Onset  . Heart attack Mother   . Heart disease Mother   . Bone cancer Father   . Cancer Father      SOCIAL HISTORY:  reports that he quit smoking about 35 years ago. He has a 35.00 pack-year smoking history. He has never used smokeless tobacco. He reports that he does not drink alcohol and does not use drugs. The patient is widowed and lives in [place]. He has  two adult sons and an adult daughter who help him with transportation, grocery shopping, medications, and bill paying.    ALLERGIES: Tape, Atorvastatin, Brilinta [ticagrelor], and Plavix [clopidogrel bisulfate]   MEDICATIONS:  Current Outpatient Medications  Medication Sig Dispense Refill  . furosemide (LASIX) 20 MG tablet Take 1 tablet (20 mg total) by mouth daily. For leg swelling. 30 tablet 2  . midodrine (PROAMATINE) 5 MG tablet Take 1 tablet (5 mg total) by mouth 2 (two) times daily with a meal.    . Multiple Vitamins-Minerals (ICAPS AREDS FORMULA PO) Take 1 capsule by mouth 2 (two) times daily.    Marland Kitchen omeprazole (PRILOSEC) 40 MG capsule Take 1 capsule (40 mg total) by mouth 2 (two) times daily before a meal. 60 capsule 5  . spironolactone (ALDACTONE) 50 MG tablet Take 1 tablet (50 mg total) by mouth daily. 30 tablet 2   No current facility-administered medications for this encounter.     REVIEW OF SYSTEMS: On review of systems, the patient is still very weak, he has been working with physical therapy twice a  week at home.  He is fiercely independent about wanting to stay at his own house.  His children continue to help him with grocery shopping bill pay and some elements of daily living.  He is not experiencing any abdominal pain fullness or early satiety, the last time he had paracentesis was in April during his hospitalization when more than 3 L were removed.  He is not experiencing any jaundice or itching.  No nausea or vomiting is noted.  No other complaints are verbalized.    PHYSICAL EXAM:  Unable to assess due to encounter type.  ECOG = 1  0 - Asymptomatic (Fully active, able to carry on all predisease activities without restriction)  1 - Symptomatic but completely ambulatory (Restricted in physically strenuous activity but ambulatory and able to carry out work of a light or sedentary nature. For example, light housework, office work)  2 - Symptomatic, <50% in bed during the  day (Ambulatory and capable of all self care but unable to carry out any work activities. Up and about more than 50% of waking hours)  3 - Symptomatic, >50% in bed, but not bedbound (Capable of only limited self-care, confined to bed or chair 50% or more of waking hours)  4 - Bedbound (Completely disabled. Cannot carry on any self-care. Totally confined to bed or chair)  5 - Death   Eustace Pen MM, Creech RH, Tormey DC, et al. [id number]). "Toxicity and response criteria of the [facility]". [place] Oncol. 5 (6): 649-55    LABORATORY DATA:  Lab Results  Component Value Date   WBC 10.3 09/21/2020   HGB 14.8 09/21/2020   HCT 44.2 09/21/2020   MCV 95.7 09/21/2020   PLT 196 09/21/2020   Lab Results  Component Value Date   NA 135 11/02/2020   K 4.8 11/02/2020   CL 100 11/02/2020   CO2 24 11/02/2020   Lab Results  Component Value Date   ALT 35 10/15/2020   AST 44 (H) 10/15/2020   ALKPHOS 337 (H) 10/15/2020   BILITOT 0.6 10/15/2020      RADIOGRAPHY: MR LIVER W WO CONTRAST  Result Date: 11/04/2020 CLINICAL DATA:  Cirrhosis with hepatocellular carcinoma. No interval therapy. Restaging. Paracentesis 09/14/2020. EXAM: MRI ABDOMEN WITHOUT AND WITH CONTRAST TECHNIQUE: Multiplanar multisequence MR imaging of the abdomen was performed both before and after the administration of intravenous contrast. CONTRAST:  6.54mL GADAVIST GADOBUTROL 1 MMOL/ML IV SOLN COMPARISON:  07/05/2020 MRI abdomen. FINDINGS: Lower chest: No acute abnormality at the lung bases. Hepatobiliary: Diffusely irregular liver surface with prominent relative hypertrophy of the lateral segment left liver lobe, compatible with cirrhosis. No hepatic steatosis. There is a 2.8 x 1.8 cm peripheral segment 6 right liver mass (series 17/image 39) with arterial hyperenhancement and portal washout, previously 1.8 x 1.4 cm on 07/05/2020 MRI using similar measurement technique, increased. New 0.5 cm subcapsular focus of  arterial hyperenhancement in segment 2 left liver dome (series 17/image 19), occult on other sequences. Contracted gallbladder filled with gallstones measuring up to 10 mm. No gallbladder wall thickening. No biliary ductal dilatation. Common bile duct diameter 5 mm. No choledocholithiasis. No biliary masses, strictures or beading. Pancreas: No pancreatic mass or duct dilation.  No pancreas divisum. Spleen: Normal size. No mass. Adrenals/Urinary Tract: Normal adrenals. No hydronephrosis. Lobulated 5.1 x 4.7 cm anterior lower right renal cyst with thin eccentric internal septations (series 5/image 26), considered Bosniak category 2. Additional bilateral simple renal cortical cysts, largest 2.2 cm in the  lower right kidney. No suspicious renal masses. Stomach/Bowel: Normal non-distended stomach. Visualized small and large bowel is normal caliber, with no bowel wall thickening. Marked sigmoid diverticulosis. Vascular/Lymphatic: Atherosclerotic abdominal aorta with 5.9 x 5.5 cm infrarenal abdominal aortic aneurysm, previously 5.7 x 5.4 cm using similar measurement technique, slightly increased. Small gastroesophageal varices. No pathologically enlarged lymph nodes in the abdomen. Other: Small to moderate volume ascites. No focal fluid collections. Musculoskeletal: No aggressive appearing focal osseous lesions. IMPRESSION: 1. Cirrhosis. Interval growth of 2.8 cm peripheral segment 6 right liver mass with arterial hyperenhancement and portal washout, compatible with hepatocellular carcinoma (LI-RADS Category 5). 2. New 0.5 cm subcapsular focus of arterial hyperenhancement in the segment 2 left liver dome, indeterminate. Continued attention on follow-up MRI recommended. 3. Small to moderate volume ascites. Small gastroesophageal varices. No splenomegaly. 4. Infrarenal 5.9 x 5.5 cm abdominal aortic aneurysm, slightly increased in size. Recommend referral to a vascular specialist. This recommendation follows ACR consensus  guidelines: White Paper of the ACR Incidental Findings Committee II on Vascular Findings. J Am Coll Radiol 2013; 10:789-794. 5. Marked sigmoid diverticulosis. Electronically Signed   By: Ilona Sorrel M.D.   On: 11/04/2020 13:40       IMPRESSION/PLAN: 1. Hepatocellular Carcinoma of the right liver. Dr. Lisbeth Renshaw has reviewed the films, and would like to proceed with offering the patient stereotactic body radiotherapy to the lesion in the left lobe of the liver.  We reviewed the updates of the changes that have occurred in the last few months since his MRI in January 2022.  There would be likely progression in the next interval as well.  It sounds as though the patient is struggling with ADLs to some degree, and we discussed that if he did not wish to proceed with treatment we should coordinate evaluation with hospice care.  I would recommend evaluation with palliative medicine at this time as well for help with symptom management and extra resources at home.  The patient's son David Bradshaw is going to discuss this more so with his family and the patient and let us know how he would like to proceed.  Regarding his disease in the liver, the patient does not feel that he is currently having significant symptoms and requests that we delay treatment by 3 or 4 more months.  If his clinical status were to improve we would certainly be able to offer him treatment at that time or now, but would likely need additional diagnostic imaging to plan therapy.  If his clinical status declines I do recommend that hospice care can be considered.  They are going to consider all these options.  I will touch base with the family in several months to see how things are going, and they may contact us sooner for referral to palliative medicine.  If he were to proceed with stereotactic body radiotherapy, Dr. Lisbeth Renshaw would not require fiducial marker placement and could potentially forego IV contrast and just fuse his simulation images from his prior  MRI.  We reviewed the risks, benefits, short and long-term effects of radiotherapy, and we will touch base in several months to see how the patient would like to proceed.     Given current concerns for patient exposure during the COVID-19 pandemic, this encounter was conducted via telephone.  The patient has provided two factor identification and has given verbal consent for this type of encounter and has been advised to only accept a meeting of this type in a secure network environment. The time spent  during this encounter was 45 minutes including preparation, discussion, and coordination of the patient's care. The attendants for this meeting include Blenda Nicely, RN, Hayden Pedro  and [place]. along with his two sons David Bradshaw and Lelan Cush. During the encounter,  Blenda Nicely, RN, Dr. and Hayden Pedro were located at [facility] Radiation Oncology Department.  Ayesha Rumpf Sr. was located at home with his sons David Bradshaw and Autrey Human.      Carola Rhine, [facility]   **Disclaimer: This note was dictated with voice recognition software. Similar sounding words can inadvertently be transcribed and this note may contain transcription errors which may not have been corrected upon publication of note.**

## 2020-11-07 ENCOUNTER — Other Ambulatory Visit: Payer: Self-pay | Admitting: Radiation Oncology

## 2020-11-07 DIAGNOSIS — C220 Liver cell carcinoma: Secondary | ICD-10-CM

## 2020-11-07 DIAGNOSIS — K7460 Unspecified cirrhosis of liver: Secondary | ICD-10-CM

## 2020-11-07 DIAGNOSIS — N183 Chronic kidney disease, stage 3 unspecified: Secondary | ICD-10-CM

## 2020-11-07 NOTE — Progress Notes (Signed)
Pt's son [name] called back wanting referral to outpt palliative care. Orders placed.

## 2020-11-08 DIAGNOSIS — K2970 Gastritis, unspecified, without bleeding: Secondary | ICD-10-CM | POA: Diagnosis not present

## 2020-11-08 DIAGNOSIS — C229 Malignant neoplasm of liver, not specified as primary or secondary: Secondary | ICD-10-CM | POA: Diagnosis not present

## 2020-11-08 DIAGNOSIS — H538 Other visual disturbances: Secondary | ICD-10-CM | POA: Diagnosis not present

## 2020-11-08 DIAGNOSIS — K209 Esophagitis, unspecified without bleeding: Secondary | ICD-10-CM | POA: Diagnosis not present

## 2020-11-08 DIAGNOSIS — I714 Abdominal aortic aneurysm, without rupture: Secondary | ICD-10-CM | POA: Diagnosis not present

## 2020-11-08 DIAGNOSIS — R188 Other ascites: Secondary | ICD-10-CM | POA: Diagnosis not present

## 2020-11-08 DIAGNOSIS — K7469 Other cirrhosis of liver: Secondary | ICD-10-CM | POA: Diagnosis not present

## 2020-11-08 DIAGNOSIS — I350 Nonrheumatic aortic (valve) stenosis: Secondary | ICD-10-CM | POA: Diagnosis not present

## 2020-11-08 DIAGNOSIS — K7581 Nonalcoholic steatohepatitis (NASH): Secondary | ICD-10-CM | POA: Diagnosis not present

## 2020-11-08 DIAGNOSIS — I6529 Occlusion and stenosis of unspecified carotid artery: Secondary | ICD-10-CM | POA: Diagnosis not present

## 2020-11-13 DIAGNOSIS — K7469 Other cirrhosis of liver: Secondary | ICD-10-CM | POA: Diagnosis not present

## 2020-11-13 DIAGNOSIS — I6529 Occlusion and stenosis of unspecified carotid artery: Secondary | ICD-10-CM | POA: Diagnosis not present

## 2020-11-13 DIAGNOSIS — C229 Malignant neoplasm of liver, not specified as primary or secondary: Secondary | ICD-10-CM | POA: Diagnosis not present

## 2020-11-13 DIAGNOSIS — K2970 Gastritis, unspecified, without bleeding: Secondary | ICD-10-CM | POA: Diagnosis not present

## 2020-11-13 DIAGNOSIS — K7581 Nonalcoholic steatohepatitis (NASH): Secondary | ICD-10-CM | POA: Diagnosis not present

## 2020-11-13 DIAGNOSIS — R188 Other ascites: Secondary | ICD-10-CM | POA: Diagnosis not present

## 2020-11-13 DIAGNOSIS — I350 Nonrheumatic aortic (valve) stenosis: Secondary | ICD-10-CM | POA: Diagnosis not present

## 2020-11-13 DIAGNOSIS — H538 Other visual disturbances: Secondary | ICD-10-CM | POA: Diagnosis not present

## 2020-11-13 DIAGNOSIS — K209 Esophagitis, unspecified without bleeding: Secondary | ICD-10-CM | POA: Diagnosis not present

## 2020-11-13 DIAGNOSIS — I714 Abdominal aortic aneurysm, without rupture: Secondary | ICD-10-CM | POA: Diagnosis not present

## 2020-11-15 DIAGNOSIS — I6529 Occlusion and stenosis of unspecified carotid artery: Secondary | ICD-10-CM | POA: Diagnosis not present

## 2020-11-15 DIAGNOSIS — C229 Malignant neoplasm of liver, not specified as primary or secondary: Secondary | ICD-10-CM | POA: Diagnosis not present

## 2020-11-15 DIAGNOSIS — K209 Esophagitis, unspecified without bleeding: Secondary | ICD-10-CM | POA: Diagnosis not present

## 2020-11-15 DIAGNOSIS — K7469 Other cirrhosis of liver: Secondary | ICD-10-CM | POA: Diagnosis not present

## 2020-11-15 DIAGNOSIS — I350 Nonrheumatic aortic (valve) stenosis: Secondary | ICD-10-CM | POA: Diagnosis not present

## 2020-11-15 DIAGNOSIS — K7581 Nonalcoholic steatohepatitis (NASH): Secondary | ICD-10-CM | POA: Diagnosis not present

## 2020-11-15 DIAGNOSIS — K2970 Gastritis, unspecified, without bleeding: Secondary | ICD-10-CM | POA: Diagnosis not present

## 2020-11-15 DIAGNOSIS — R188 Other ascites: Secondary | ICD-10-CM | POA: Diagnosis not present

## 2020-11-15 DIAGNOSIS — H538 Other visual disturbances: Secondary | ICD-10-CM | POA: Diagnosis not present

## 2020-11-15 DIAGNOSIS — I714 Abdominal aortic aneurysm, without rupture: Secondary | ICD-10-CM | POA: Diagnosis not present

## 2020-11-20 DIAGNOSIS — K7581 Nonalcoholic steatohepatitis (NASH): Secondary | ICD-10-CM | POA: Diagnosis not present

## 2020-11-20 DIAGNOSIS — R188 Other ascites: Secondary | ICD-10-CM | POA: Diagnosis not present

## 2020-11-20 DIAGNOSIS — I6529 Occlusion and stenosis of unspecified carotid artery: Secondary | ICD-10-CM | POA: Diagnosis not present

## 2020-11-20 DIAGNOSIS — K209 Esophagitis, unspecified without bleeding: Secondary | ICD-10-CM | POA: Diagnosis not present

## 2020-11-20 DIAGNOSIS — K7469 Other cirrhosis of liver: Secondary | ICD-10-CM | POA: Diagnosis not present

## 2020-11-20 DIAGNOSIS — C229 Malignant neoplasm of liver, not specified as primary or secondary: Secondary | ICD-10-CM | POA: Diagnosis not present

## 2020-11-20 DIAGNOSIS — K2970 Gastritis, unspecified, without bleeding: Secondary | ICD-10-CM | POA: Diagnosis not present

## 2020-11-20 DIAGNOSIS — I714 Abdominal aortic aneurysm, without rupture: Secondary | ICD-10-CM | POA: Diagnosis not present

## 2020-11-20 DIAGNOSIS — I350 Nonrheumatic aortic (valve) stenosis: Secondary | ICD-10-CM | POA: Diagnosis not present

## 2020-11-20 DIAGNOSIS — H538 Other visual disturbances: Secondary | ICD-10-CM | POA: Diagnosis not present

## 2020-11-21 ENCOUNTER — Ambulatory Visit: Payer: Medicare HMO | Admitting: Gastroenterology

## 2020-11-21 ENCOUNTER — Other Ambulatory Visit (INDEPENDENT_AMBULATORY_CARE_PROVIDER_SITE_OTHER): Payer: Medicare HMO

## 2020-11-21 ENCOUNTER — Encounter: Payer: Self-pay | Admitting: Gastroenterology

## 2020-11-21 VITALS — BP 90/68 | HR 93 | Ht 60.0 in | Wt 139.0 lb

## 2020-11-21 DIAGNOSIS — R600 Localized edema: Secondary | ICD-10-CM | POA: Diagnosis not present

## 2020-11-21 DIAGNOSIS — R188 Other ascites: Secondary | ICD-10-CM | POA: Diagnosis not present

## 2020-11-21 DIAGNOSIS — C220 Liver cell carcinoma: Secondary | ICD-10-CM

## 2020-11-21 DIAGNOSIS — K7460 Unspecified cirrhosis of liver: Secondary | ICD-10-CM

## 2020-11-21 DIAGNOSIS — K209 Esophagitis, unspecified without bleeding: Secondary | ICD-10-CM

## 2020-11-21 LAB — COMPREHENSIVE METABOLIC PANEL
ALT: 36 U/L (ref 0–53)
AST: 43 U/L — ABNORMAL HIGH (ref 0–37)
Albumin: 3.4 g/dL — ABNORMAL LOW (ref 3.5–5.2)
Alkaline Phosphatase: 304 U/L — ABNORMAL HIGH (ref 39–117)
BUN: 24 mg/dL — ABNORMAL HIGH (ref 6–23)
CO2: 26 mEq/L (ref 19–32)
Calcium: 8.9 mg/dL (ref 8.4–10.5)
Chloride: 102 mEq/L (ref 96–112)
Creatinine, Ser: 1.22 mg/dL (ref 0.40–1.50)
GFR: 51.81 mL/min — ABNORMAL LOW (ref >60.00)
Glucose, Bld: 108 mg/dL — ABNORMAL HIGH (ref 70–99)
Potassium: 4.2 mEq/L (ref 3.5–5.1)
Sodium: 137 mEq/L (ref 135–145)
Total Bilirubin: 0.7 mg/dL (ref 0.2–1.2)
Total Protein: 7.2 g/dL (ref 6.0–8.3)

## 2020-11-21 NOTE — Patient Instructions (Addendum)
If you are age 85 or older, your body mass index should be between 23-30. Your Body mass index is 27.15 kg/m. If this is out of the aforementioned range listed, please consider follow up with your Primary Care Provider.  If you are age 44 or younger, your body mass index should be between 19-25. Your Body mass index is 27.15 kg/m. If this is out of the aformentioned range listed, please consider follow up with your Primary Care Provider.   __________________________________________________________  The [place] [facility] providers would like to encourage you to use [facility] to communicate with providers for non-urgent requests or questions.  Due to long hold times on the telephone, sending your provider a message by [facility] may be a faster and more efficient way to get a response.  Please allow 48 business hours for a response.  Please remember that this is for non-urgent requests.   Your provider has requested that you go to the basement level for lab work before leaving today. Press "B" on the elevator. The lab is located at the first door on the left as you exit the elevator.  Due to recent changes in healthcare laws, you may see the results of your imaging and laboratory studies on MyChart before your provider has had a chance to review them.  We understand that in some cases there may be results that are confusing or concerning to you. Not all laboratory results come back in the same time frame and the provider may be waiting for multiple results in order to interpret others.  Please give [place] 48 hours in order for your provider to thoroughly review all the results before contacting the office for clarification of your results.   Follow up in 2 months   It was a pleasure to see you today!  Thank you for trusting me with your gastrointestinal care!

## 2020-11-21 NOTE — Progress Notes (Signed)
HPI :  87 year old male here for follow-up visit for cirrhosis, HCC, history of gastric ulcers and esophagitis.  [place] / cirrhosis history: He presented to [place] for a routine outpatient office consultation in January 2022 with jaundice. He underwent serologic work-up and imaging. Eventually had an MRCP showing gallstones in the gallbladder with a dilated bile duct and choledocholithiasis. Incidentally also was noted to have evidence of cirrhosis with a small suspected HCC. He underwent elective ERCP with Dr. Rush Landmark on February 1. He had removal of stone from the CBD and sphincterotomy performed. The patient tolerated the procedure well. Given his age and comorbidities he has not undergone cholecystectomy, he has multiple stones noted in his gallbladder previously. Given he had findings of [place] on MRI his case was discussed at the cancer conference. He is not a good surgical candidate given his age, not a transplant candidate, it was recommended he be referred to IR to discuss options they can offer. He spoke with Dr. Kathlene Cote of IR and was referred to Dr. Lisbeth Renshaw to discuss possible radiation therapy in case the lesion grows.   He has no history of alcohol use. He denies any history of known liver disease. His work-up has included negative testing hepatitis B and C, negative testing for autoimmune hepatitis and PBC, celiac disease.   Noted during his EGD he had multiple gastric ulcers as well as severe esophagitis, with concern for possible underlying malignancy at the distal esophagus. Biopsies were taken and negative for dysplasia or malignancy. He tested negative for H. pylori. He does take a regular aspirin every day for history of TIA. He was placed on Protonix 40 mg twice daily. Dr. Rush Landmark had recommended a follow-up endoscopy in light of the esophageal findings. The patient in the interim developed some lower extremity edema that has bothered him as well as some shortness of  breath.   He had an echocardiogram in March with an EF of 50 to 55%, stable changes since 2017.  He had been on Lasix 20 mg a day and Aldactone 50 mg a day for mild edema.  He was admitted to the hospital in early April with volume overload, worsening lower extremity edema and a fall.  He had some hypotension and was given some midodrine to help with that, as well as an AKI which improved upon discharge.  He was in the hospital for 1 week and then in rehab for the past 3 weeks.    SINCE LAST VISIT:  The patient states has been feeling well since of last seen him.  He is accompanied by his son today.  We checked his kidney function a few weeks ago which had improved on his current medication regimen so we decided to continue his present dosing of Lasix/Aldactone.  His edema continues to improve, better than previous.  He is lost 6 pounds over the past several weeks, son thinks due to retained water weight.  They are hoping he is following a low-sodium diet but cannot monitor everything he eats, he is living alone.  Otherwise denies any abdominal pains.  Is eating okay, continues to have Ensure supplemental shakes.  Son states he is ambulating much better, no longer uses a walker to ambulate.  He had a follow-up MRI on May 22 to assess disease progression.  The known [place] is slightly larger at 2.8 cm and there is another smaller lesion 0.5 cm on the left liver dome which is indeterminate.  He was seen by radiation oncology recently  and offered stereotactic body radiotherapy but patient and son declined it, not sure if he was well enough to handle that.  Radiation oncology had recommended a palliative care consultation to discuss that option as well.  They are evaluating the patient on Friday.  Patient currently lives alone but his son lives about 15 minutes from him.  He is currently able to perform his ADLs but we discussed options when it comes to the point where he can no longer care for himself or has  problems with either progression of cancer or from side effects from radiation if he decides to proceed with that.  Patient and son again are adamant they do not want any elective procedures done they are declining follow-up endoscopy for ulcerated mucosa at the distal esophagus.  Patient has been compliant with PPI.   Prior workup: MRCP 07/05/20 -IMPRESSION: Hepatic cirrhosis. 1.6 cm subcapsular mass in the lateral aspect of right lobe, which has characteristics diagnostic for hepatocellular carcinoma. (LI-RADS Category 5: Definitely HCC) Mild biliary ductal dilatation, with 7 mm calculus in distal common bile duct. Cholelithiasis. No radiographic evidence of cholecystitis.  5.4 cm infrarenal abdominal aortic aneurysm. Recommend follow-up CT/MR every 6 months and vascular consultation. This recommendation follows ACR consensus guidelines: White Paper of the ACR Incidental Findings Committee II on Vascular Findings. J Am Coll Radiol 2013; 10:789-794.  ERCP 07/17/20 - - No gross lesions in esophagus proximally. - Grade I esophageal varices distally. - Congested, erythematous, friable (with spontaneous bleeding), granular, inflamed, texture changed, ulcerated mucosa in the esophagus - concern for severe esophagitis vs potential for underlying malignancy - biopsied. - J-shaped deformity in the entire stomach. - Gastritis. Non-bleeding gastric ulcers with a clean ulcer base (For[place] Class III). Biopsied for HP evaluation. - Duodenitis. Non-bleeding duodenal ulcers with a clean ulcer base (Forrest Class III). Biopsied for HP evaluation. - The major papilla appeared normal. - A filling defect consistent with a stone was seen on the cholangiogram. - The entire main bile duct was moderately dilated from the stone. - Choledocholithiasis was found. Complete removal was accomplished by biliary sphincterotomy and balloon sweep.  FINAL MICROSCOPIC DIAGNOSIS:   A. DUODENUM, BIOPSY:  -  Duodenal mucosa with reactive changes  - Negative for increased intraepithelial lymphocytes or villous  architectural changes   B. STOMACH, BIOPSY:  - Gastric antral and oxyntic mucosa with mild chronic gastritis  - [place] Starry stain is negative for Helicobacter pylori   C. EG JUNCTION, BIOPSY:  - Esophageal squamous and cardiac mucosa with active chronic nonspecific  cardioesophagitis  - Negative for intestinal metaplasia or dysplasia     Echo 08/27/20 - EF 50-55%, mild LVH, mild to moderate AS - unchanged since 2017   MRI liver 11/04/20 - IMPRESSION: 1. Cirrhosis. Interval growth of 2.8 cm peripheral segment 6 right liver mass with arterial hyperenhancement and portal washout, compatible with hepatocellular carcinoma (LI-RADS Category 5). 2. New 0.5 cm subcapsular focus of arterial hyperenhancement in the segment 2 left liver dome, indeterminate. Continued attention on follow-up MRI recommended. 3. Small to moderate volume ascites. Small gastroesophageal varices. No splenomegaly. 4. Infrarenal 5.9 x 5.5 cm abdominal aortic aneurysm, slightly increased in size. Recommend referral to a vascular specialist. This recommendation follows ACR consensus guidelines: White Paper of the ACR Incidental Findings Committee II on Vascular Findings. J Am Coll Radiol 2013; 10:789-794. 5. Marked sigmoid diverticulosis.   Past Medical History:  Diagnosis Date  . Aortic stenosis    mild AS 01/2016 echo  . Carotid  stenosis   . Chicken pox   . Choledocholithiasis   . Cirrhosis ([place])   . Coronary arteriosclerosis due to lipid rich plaque 01/30/2016  . DNR (do not resuscitate) 09/13/2020  . Edema   . Esophagitis   . Gastric ulcer   . Hepatocellular carcinoma ([place]) 07/05/2020  . Myocardial infarct ([place])    1988 and 1990  . Stroke [facility])      Past Surgical History:  Procedure Laterality Date  . BIOPSY  07/17/2020   Procedure: BIOPSY;  Surgeon: Rush Landmark Telford Nab., MD;  Location:  [facility];  Service: Gastroenterology;;  . CARDIAC CATHETERIZATION N/A 02/04/2016   Procedure: Left Heart Cath and Coronary Angiography;  Surgeon: Jolaine Artist, MD;  Location: [facility];  Service: Cardiovascular;  Laterality: N/A;  . CAROTID ENDARTERECTOMY Right 02/14/2016  . CATARACT EXTRACTION    . ENDARTERECTOMY Right 02/14/2016   Procedure: RIGHT CAROTID ENDARTERECTOMY;  Surgeon: [name], MD;  Location: [facility];  Service: Vascular;  Laterality: Right;  . ERCP N/A 07/17/2020   Procedure: ENDOSCOPIC RETROGRADE CHOLANGIOPANCREATOGRAPHY (ERCP);  Surgeon: Irving Copas., MD;  Location: [facility];  Service: Gastroenterology;  Laterality: N/A;  . IR PARACENTESIS  09/14/2020  . IR RADIOLOGIST EVAL & MGMT  07/27/2020  . PATCH ANGIOPLASTY Right 02/14/2016   Procedure: [place] X2;  Surgeon: [name], MD;  Location: [facility];  Service: Vascular;  Laterality: Right;  . REMOVAL OF STONES  07/17/2020   Procedure: REMOVAL OF STONES;  Surgeon: Irving Copas., MD;  Location: [facility];  Service: Gastroenterology;;  . Joan Mayans  07/17/2020   Procedure: Joan Mayans;  Surgeon: Irving Copas., MD;  Location: [facility];  Service: Gastroenterology;;  . testicle     right hydrocele   Family History  Problem Relation Age of Onset  . Heart attack Mother   . Heart disease Mother   . Bone cancer Father   . Cancer Father    Social History   Tobacco Use  . Smoking status: Former Smoker    Packs/day: 1.00    Years: 35.00    Pack years: 35.00    Quit date: 07/13/1985    Years since quitting: 35.3  . Smokeless tobacco: Never Used  Vaping Use  . Vaping Use: Never used  Substance Use Topics  . Alcohol use: No  . Drug use: No   Current Outpatient Medications  Medication Sig Dispense Refill  . furosemide (LASIX) 20 MG tablet Take 1 tablet (20 mg total) by mouth daily. For leg  swelling. 30 tablet 2  . midodrine (PROAMATINE) 5 MG tablet Take 1 tablet (5 mg total) by mouth 2 (two) times daily with a meal. (Patient taking differently: Take 5 mg by mouth daily.)    . Multiple Vitamins-Minerals (ICAPS AREDS FORMULA PO) Take 1 capsule by mouth 2 (two) times daily.    Marland Kitchen omeprazole (PRILOSEC) 40 MG capsule Take 1 capsule (40 mg total) by mouth 2 (two) times daily before a meal. 60 capsule 5  . spironolactone (ALDACTONE) 50 MG tablet Take 1 tablet (50 mg total) by mouth daily. 30 tablet 2   No current facility-administered medications for this visit.   Allergies  Allergen Reactions  . Tape Other (See Comments)    SKIN IS VERY THIN- WILL TEAR EASILY!! PLEASE USE PAPER TAPE  . Atorvastatin Rash and Other (See Comments)    Patient started on Plavix and Lipitor at the same time and developed a  rash  . Brilinta [Ticagrelor] Hives  . Plavix [Clopidogrel Bisulfate] Rash and Other (See Comments)    Patient started on plavix and lipitor at the same time and developed a rash     Review of Systems: All systems reviewed and negative except where noted in HPI.    MR LIVER W WO CONTRAST  Result Date: 11/04/2020 CLINICAL DATA:  Cirrhosis with hepatocellular carcinoma. No interval therapy. Restaging. Paracentesis 09/14/2020. EXAM: MRI ABDOMEN WITHOUT AND WITH CONTRAST TECHNIQUE: Multiplanar multisequence MR imaging of the abdomen was performed both before and after the administration of intravenous contrast. CONTRAST:  6.55mL GADAVIST GADOBUTROL 1 MMOL/ML IV SOLN COMPARISON:  07/05/2020 MRI abdomen. FINDINGS: Lower chest: No acute abnormality at the lung bases. Hepatobiliary: Diffusely irregular liver surface with prominent relative hypertrophy of the lateral segment left liver lobe, compatible with cirrhosis. No hepatic steatosis. There is a 2.8 x 1.8 cm peripheral segment 6 right liver mass (series 17/image 39) with arterial hyperenhancement and portal washout, previously 1.8 x 1.4 cm  on 07/05/2020 MRI using similar measurement technique, increased. New 0.5 cm subcapsular focus of arterial hyperenhancement in segment 2 left liver dome (series 17/image 19), occult on other sequences. Contracted gallbladder filled with gallstones measuring up to 10 mm. No gallbladder wall thickening. No biliary ductal dilatation. Common bile duct diameter 5 mm. No choledocholithiasis. No biliary masses, strictures or beading. Pancreas: No pancreatic mass or duct dilation.  No pancreas divisum. Spleen: Normal size. No mass. Adrenals/Urinary Tract: Normal adrenals. No hydronephrosis. Lobulated 5.1 x 4.7 cm anterior lower right renal cyst with thin eccentric internal septations (series 5/image 26), considered Bosniak category 2. Additional bilateral simple renal cortical cysts, largest 2.2 cm in the lower right kidney. No suspicious renal masses. Stomach/Bowel: Normal non-distended stomach. Visualized small and large bowel is normal caliber, with no bowel wall thickening. Marked sigmoid diverticulosis. Vascular/Lymphatic: Atherosclerotic abdominal aorta with 5.9 x 5.5 cm infrarenal abdominal aortic aneurysm, previously 5.7 x 5.4 cm using similar measurement technique, slightly increased. Small gastroesophageal varices. No pathologically enlarged lymph nodes in the abdomen. Other: Small to moderate volume ascites. No focal fluid collections. Musculoskeletal: No aggressive appearing focal osseous lesions. IMPRESSION: 1. Cirrhosis. Interval growth of 2.8 cm peripheral segment 6 right liver mass with arterial hyperenhancement and portal washout, compatible with hepatocellular carcinoma (LI-RADS Category 5). 2. New 0.5 cm subcapsular focus of arterial hyperenhancement in the segment 2 left liver dome, indeterminate. Continued attention on follow-up MRI recommended. 3. Small to moderate volume ascites. Small gastroesophageal varices. No splenomegaly. 4. Infrarenal 5.9 x 5.5 cm abdominal aortic aneurysm, slightly increased  in size. Recommend referral to a vascular specialist. This recommendation follows ACR consensus guidelines: White Paper of the ACR Incidental Findings Committee II on Vascular Findings. J Am Coll Radiol 2013; 10:789-794. 5. Marked sigmoid diverticulosis. Electronically Signed   By: Ilona Sorrel M.D.   On: 11/04/2020 13:40   Lab Results  Component Value Date   WBC 10.3 09/21/2020   HGB 14.8 09/21/2020   HCT 44.2 09/21/2020   MCV 95.7 09/21/2020   PLT 196 09/21/2020    Lab Results  Component Value Date   CREATININE 1.36 11/02/2020   BUN 24 (H) 11/02/2020   NA 135 11/02/2020   K 4.8 11/02/2020   CL 100 11/02/2020   CO2 24 11/02/2020    Lab Results  Component Value Date   ALT 35 10/15/2020   AST 44 (H) 10/15/2020   ALKPHOS 337 (H) 10/15/2020   BILITOT 0.6 10/15/2020  Physical Exam: BP 90/68   Pulse 93   Ht 5' (1.524 m)   Wt 139 lb (63 kg)   BMI 27.15 kg/m  Constitutional: Pleasant, male in no acute distress. Abdominal: Soft, nondistended, nontender.  There are no masses palpable.  Extremities: (+) 1 edema Lymphadenopathy: No cervical adenopathy noted. Neurological: Alert and oriented to person place and time. Skin: Skin is warm and dry. No rashes noted. Psychiatric: Normal mood and affect. Behavior is normal.   ASSESSMENT AND PLAN: 87 year old male here for reassessment of the following:  Cirrhosis with ascites Hepatocellular carcinoma Lower extremity edema Esophagitis   Cryptogenic cirrhosis, perhaps fatty liver, with associated HCC.  His case has been discussed at multidisciplinary conference. Seen by IR and radiation oncology.  Surveillance MRI in May shows slight progression of disease.  He was offered radiation therapy but has declined it for now, prefers to continue to monitor given he is feeling better and wants to regain more strength prior to considering radiation therapy.  His kidney function had recovered from AKI during prior hospitalization, on  diuretic regimen as outlined and tolerating it okay.  We will recheck his renal function today to make sure stable, also recheck his LFTs.  I agree with palliative care consultation at this time to discuss that option with the patient.  Patient currently lives alone, the need to have plans in place for when the patient's disease progresses and he can no longer care for himself.  Unclear if they will wish to pursue radiation therapy at some point or not, depends how the patient is feeling and if he is up for that.  He is seeing radiation oncology in 3 months, will consider another MRI at that time.  If he becomes jaundiced or has any problems in the interim they will contact me, otherwise I will schedule him for an appointment with me in 2 months or so but can see me sooner if needed.  We will continue to follow his labs.  We discussed if they want to pursue a surveillance endoscopy to ensure no evidence of malignancy at the [place] based on last endoscopy.  Given his course and tenuous status, patient wants to avoid any elective procedure and has declined the EGD which I think is reasonable.  He is eating well at this time, will continue PPI for now.   Plan: - low sodium diet, continue diuretics, CMET today - continue PPI, holding off on follow up endoscopy right now - palliative care consult later this week - follow up with radiation oncology in 3 months, consider MRI at that time if considering therapy - follow up with me in 2 months or sooner with issues  Ghent Cellar, MD [facility]

## 2020-11-22 DIAGNOSIS — I350 Nonrheumatic aortic (valve) stenosis: Secondary | ICD-10-CM | POA: Diagnosis not present

## 2020-11-22 DIAGNOSIS — R188 Other ascites: Secondary | ICD-10-CM | POA: Diagnosis not present

## 2020-11-22 DIAGNOSIS — H538 Other visual disturbances: Secondary | ICD-10-CM | POA: Diagnosis not present

## 2020-11-22 DIAGNOSIS — C229 Malignant neoplasm of liver, not specified as primary or secondary: Secondary | ICD-10-CM | POA: Diagnosis not present

## 2020-11-22 DIAGNOSIS — K209 Esophagitis, unspecified without bleeding: Secondary | ICD-10-CM | POA: Diagnosis not present

## 2020-11-22 DIAGNOSIS — K7581 Nonalcoholic steatohepatitis (NASH): Secondary | ICD-10-CM | POA: Diagnosis not present

## 2020-11-22 DIAGNOSIS — K7469 Other cirrhosis of liver: Secondary | ICD-10-CM | POA: Diagnosis not present

## 2020-11-22 DIAGNOSIS — I714 Abdominal aortic aneurysm, without rupture: Secondary | ICD-10-CM | POA: Diagnosis not present

## 2020-11-22 DIAGNOSIS — I6529 Occlusion and stenosis of unspecified carotid artery: Secondary | ICD-10-CM | POA: Diagnosis not present

## 2020-11-22 DIAGNOSIS — K2970 Gastritis, unspecified, without bleeding: Secondary | ICD-10-CM | POA: Diagnosis not present

## 2020-11-26 ENCOUNTER — Telehealth: Payer: Self-pay

## 2020-11-26 DIAGNOSIS — I350 Nonrheumatic aortic (valve) stenosis: Secondary | ICD-10-CM | POA: Diagnosis not present

## 2020-11-26 DIAGNOSIS — K7469 Other cirrhosis of liver: Secondary | ICD-10-CM | POA: Diagnosis not present

## 2020-11-26 DIAGNOSIS — I6529 Occlusion and stenosis of unspecified carotid artery: Secondary | ICD-10-CM | POA: Diagnosis not present

## 2020-11-26 DIAGNOSIS — K7581 Nonalcoholic steatohepatitis (NASH): Secondary | ICD-10-CM | POA: Diagnosis not present

## 2020-11-26 DIAGNOSIS — R188 Other ascites: Secondary | ICD-10-CM | POA: Diagnosis not present

## 2020-11-26 DIAGNOSIS — K209 Esophagitis, unspecified without bleeding: Secondary | ICD-10-CM | POA: Diagnosis not present

## 2020-11-26 DIAGNOSIS — I714 Abdominal aortic aneurysm, without rupture: Secondary | ICD-10-CM | POA: Diagnosis not present

## 2020-11-26 DIAGNOSIS — H538 Other visual disturbances: Secondary | ICD-10-CM | POA: Diagnosis not present

## 2020-11-26 DIAGNOSIS — C229 Malignant neoplasm of liver, not specified as primary or secondary: Secondary | ICD-10-CM | POA: Diagnosis not present

## 2020-11-26 DIAGNOSIS — K2970 Gastritis, unspecified, without bleeding: Secondary | ICD-10-CM | POA: Diagnosis not present

## 2020-11-26 NOTE — Telephone Encounter (Signed)
Have also called all numbers for patient and emergency contact no answer l/m

## 2020-11-26 NOTE — Telephone Encounter (Signed)
unable to reach Haze Justin or pt by phone. Sending note to Gentry Fitz NP and [facility] CMA. Per access nurse note [name] complied with access nurse advise which would be to call EMS; also noted that [name] would speak with his brother who is with pt. Tried multiple times calling but unable to reach any one by any contact #. Sending note to Gentry Fitz NP who is out of office and Joellen CMA.

## 2020-11-26 NOTE — Telephone Encounter (Signed)
[facility] - Client TELEPHONE ADVICE RECORD AccessNurse Patient Name: David Bradshaw Gender: Male DOB: 20-Aug-1926 Age: 87 Y 6 M 29 D Return Phone Number: [phone] (Primary), [phone] (Secondary) Address: City/ State/ Zip: [place] [address] Client [place] [facility] - Client Client Site [place] - Day Physician Alma Friendly - NP Contact Type Call Who Is Calling Patient / Member / Family / Caregiver Call Type Triage / Clinical Caller Name Maclovio Henson Relationship To Patient Son Return Phone Number [phone] (Primary) Chief Complaint FAINTING or [place] Reason for Call Symptomatic / Request for [place] states his dad fainted twice this weekend, his face gets too hot and he faints. No upcoming appointment schedule per office. Translation No Nurse Assessment Nurse: Valentino Nose, RN, [place] Date/Time (Eastern Time): 11/26/2020 11:06:08 AM Confirm and document reason for call. If symptomatic, describe symptoms. ---Caller states his dad fainted twice this weekend. States his face gets real hot when it happens. He is unsure to what is causing it. Does the patient have any new or worsening symptoms? ---Yes Will a triage be completed? ---Yes Related visit to physician within the last 2 weeks? ---No Does the PT have any chronic conditions? (i.e. diabetes, asthma, this includes High risk factors for pregnancy, etc.) ---Yes List chronic conditions. ---cirrhosis of the liver Is this a behavioral health or substance abuse call? ---No Guidelines Guideline Title Affirmed Question Affirmed Notes Nurse Date/Time (Eastern Time) Fainting Age > 50 years (Exception: occurred > 1 hour ago AND now feels completely fine) Valentino Nose, RN, [place] 11/26/2020 11:07:56 AM Disp. Time Eilene Ghazi Time) Disposition Final User 11/26/2020 11:05:09 AM Send to Urgent Queue  [place], Tiffany PLEASE NOTE: All timestamps contained within this report are represented as [place] Standard Time. CONFIDENTIALTY NOTICE: This fax transmission is intended only for the addressee. It contains information that is legally privileged, confidential or otherwise protected from use or disclosure. If you are not the intended recipient, you are strictly prohibited from reviewing, disclosing, copying using or disseminating any of this information or taking any action in reliance on or regarding this information. If you have received this fax in error, please notify us immediately by telephone so that we can arrange for its return to [place]. Phone: [phone], Toll-Free: [phone], Fax: [phone] Page: 2 of 2 Call Id: [id number] [place]. Time Eilene Ghazi Time) Disposition Final User 11/26/2020 11:11:32 AM 911 Outcome Documentation Valentino Nose, RN, [place] Reason: Caller states he will let his brother know because he is there with the pt now having physical therapy. 11/26/2020 11:10:43 AM Call EMS 911 Now Yes Valentino Nose, RN, Arnetha Courser Disagree/Comply Comply Caller Understands Yes PreDisposition Call Doctor Care Advice Given Per Guideline CALL EMS 911 NOW:

## 2020-11-27 NOTE — Telephone Encounter (Signed)
Noted, will evaluate. 

## 2020-11-27 NOTE — Telephone Encounter (Signed)
He is on a medication called midodrine that can cause orthostatic hypotension (BP drop with positional changes like sitting to standing) this could cause syncope.   Recommend mentioning this to his GI doctor. I am also happy to see patient.

## 2020-11-27 NOTE — Telephone Encounter (Signed)
Called and spoke to son. States he called GI about that medicine but was told that they will need to see pcp to get evaluation on this. I have made appointment for evaluation in office next week.

## 2020-11-28 ENCOUNTER — Inpatient Hospital Stay (HOSPITAL_COMMUNITY)
Admission: EM | Admit: 2020-11-28 | Discharge: 2020-12-03 | DRG: 312 | Disposition: A | Discharge status: Home Health | Payer: Medicare HMO | Attending: Internal Medicine | Admitting: Internal Medicine

## 2020-11-28 ENCOUNTER — Emergency Department (HOSPITAL_COMMUNITY): Payer: Medicare HMO

## 2020-11-28 ENCOUNTER — Other Ambulatory Visit: Payer: Self-pay

## 2020-11-28 ENCOUNTER — Encounter (HOSPITAL_COMMUNITY): Payer: Self-pay | Admitting: Pharmacy Technician

## 2020-11-28 DIAGNOSIS — F458 Other somatoform disorders: Secondary | ICD-10-CM | POA: Diagnosis present

## 2020-11-28 DIAGNOSIS — R55 Syncope and collapse: Secondary | ICD-10-CM

## 2020-11-28 DIAGNOSIS — T148XXA Other injury of unspecified body region, initial encounter: Secondary | ICD-10-CM

## 2020-11-28 DIAGNOSIS — Z888 Allergy status to other drugs, medicaments and biological substances status: Secondary | ICD-10-CM

## 2020-11-28 DIAGNOSIS — R296 Repeated falls: Secondary | ICD-10-CM | POA: Diagnosis present

## 2020-11-28 DIAGNOSIS — N3000 Acute cystitis without hematuria: Secondary | ICD-10-CM | POA: Diagnosis not present

## 2020-11-28 DIAGNOSIS — S0990XA Unspecified injury of head, initial encounter: Secondary | ICD-10-CM | POA: Diagnosis not present

## 2020-11-28 DIAGNOSIS — I440 Atrioventricular block, first degree: Secondary | ICD-10-CM | POA: Diagnosis present

## 2020-11-28 DIAGNOSIS — K209 Esophagitis, unspecified without bleeding: Secondary | ICD-10-CM | POA: Diagnosis present

## 2020-11-28 DIAGNOSIS — N390 Urinary tract infection, site not specified: Secondary | ICD-10-CM | POA: Diagnosis present

## 2020-11-28 DIAGNOSIS — Z20822 Contact with and (suspected) exposure to covid-19: Secondary | ICD-10-CM | POA: Diagnosis present

## 2020-11-28 DIAGNOSIS — K7460 Unspecified cirrhosis of liver: Secondary | ICD-10-CM | POA: Diagnosis present

## 2020-11-28 DIAGNOSIS — Z87891 Personal history of nicotine dependence: Secondary | ICD-10-CM

## 2020-11-28 DIAGNOSIS — R42 Dizziness and giddiness: Secondary | ICD-10-CM

## 2020-11-28 DIAGNOSIS — Z809 Family history of malignant neoplasm, unspecified: Secondary | ICD-10-CM

## 2020-11-28 DIAGNOSIS — M19041 Primary osteoarthritis, right hand: Secondary | ICD-10-CM | POA: Diagnosis not present

## 2020-11-28 DIAGNOSIS — S41111A Laceration without foreign body of right upper arm, initial encounter: Secondary | ICD-10-CM | POA: Diagnosis present

## 2020-11-28 DIAGNOSIS — G9341 Metabolic encephalopathy: Secondary | ICD-10-CM | POA: Diagnosis present

## 2020-11-28 DIAGNOSIS — J439 Emphysema, unspecified: Secondary | ICD-10-CM | POA: Diagnosis present

## 2020-11-28 DIAGNOSIS — I252 Old myocardial infarction: Secondary | ICD-10-CM

## 2020-11-28 DIAGNOSIS — M7989 Other specified soft tissue disorders: Secondary | ICD-10-CM | POA: Diagnosis not present

## 2020-11-28 DIAGNOSIS — Z8505 Personal history of malignant neoplasm of liver: Secondary | ICD-10-CM

## 2020-11-28 DIAGNOSIS — Z8249 Family history of ischemic heart disease and other diseases of the circulatory system: Secondary | ICD-10-CM

## 2020-11-28 DIAGNOSIS — Z043 Encounter for examination and observation following other accident: Secondary | ICD-10-CM | POA: Diagnosis not present

## 2020-11-28 DIAGNOSIS — I350 Nonrheumatic aortic (valve) stenosis: Secondary | ICD-10-CM | POA: Diagnosis present

## 2020-11-28 DIAGNOSIS — J90 Pleural effusion, not elsewhere classified: Secondary | ICD-10-CM | POA: Diagnosis not present

## 2020-11-28 DIAGNOSIS — I471 Supraventricular tachycardia: Secondary | ICD-10-CM | POA: Diagnosis present

## 2020-11-28 DIAGNOSIS — R000 Tachycardia, unspecified: Secondary | ICD-10-CM | POA: Diagnosis not present

## 2020-11-28 DIAGNOSIS — Z8711 Personal history of peptic ulcer disease: Secondary | ICD-10-CM

## 2020-11-28 DIAGNOSIS — S51011A Laceration without foreign body of right elbow, initial encounter: Secondary | ICD-10-CM | POA: Diagnosis not present

## 2020-11-28 DIAGNOSIS — I951 Orthostatic hypotension: Secondary | ICD-10-CM | POA: Diagnosis not present

## 2020-11-28 DIAGNOSIS — I5032 Chronic diastolic (congestive) heart failure: Secondary | ICD-10-CM | POA: Diagnosis present

## 2020-11-28 DIAGNOSIS — C220 Liver cell carcinoma: Secondary | ICD-10-CM | POA: Diagnosis present

## 2020-11-28 DIAGNOSIS — M47812 Spondylosis without myelopathy or radiculopathy, cervical region: Secondary | ICD-10-CM | POA: Diagnosis not present

## 2020-11-28 DIAGNOSIS — Z8673 Personal history of transient ischemic attack (TIA), and cerebral infarction without residual deficits: Secondary | ICD-10-CM

## 2020-11-28 DIAGNOSIS — Z9109 Other allergy status, other than to drugs and biological substances: Secondary | ICD-10-CM

## 2020-11-28 DIAGNOSIS — E860 Dehydration: Secondary | ICD-10-CM | POA: Diagnosis present

## 2020-11-28 DIAGNOSIS — Z66 Do not resuscitate: Secondary | ICD-10-CM | POA: Diagnosis present

## 2020-11-28 DIAGNOSIS — R188 Other ascites: Secondary | ICD-10-CM | POA: Diagnosis present

## 2020-11-28 DIAGNOSIS — Z79899 Other long term (current) drug therapy: Secondary | ICD-10-CM

## 2020-11-28 DIAGNOSIS — I2510 Atherosclerotic heart disease of native coronary artery without angina pectoris: Secondary | ICD-10-CM | POA: Diagnosis present

## 2020-11-28 DIAGNOSIS — S51811A Laceration without foreign body of right forearm, initial encounter: Secondary | ICD-10-CM | POA: Diagnosis not present

## 2020-11-28 DIAGNOSIS — R58 Hemorrhage, not elsewhere classified: Secondary | ICD-10-CM | POA: Diagnosis present

## 2020-11-28 LAB — CBC WITH DIFFERENTIAL/PLATELET
Abs Immature Granulocytes: 0.08 10*3/uL — ABNORMAL HIGH (ref 0.00–0.07)
Basophils Absolute: 0.2 10*3/uL — ABNORMAL HIGH (ref 0.0–0.1)
Basophils Relative: 1 %
Eosinophils Absolute: 0.2 10*3/uL (ref 0.0–0.5)
Eosinophils Relative: 2 %
HCT: 52.6 % — ABNORMAL HIGH (ref 39.0–52.0)
Hemoglobin: 17.6 g/dL — ABNORMAL HIGH (ref 13.0–17.0)
Immature Granulocytes: 1 %
Lymphocytes Relative: 27 %
Lymphs Abs: 3 10*3/uL (ref 0.7–4.0)
MCH: 32.9 pg (ref 26.0–34.0)
MCHC: 33.5 g/dL (ref 30.0–36.0)
MCV: 98.3 fL (ref 80.0–100.0)
Monocytes Absolute: 1.1 10*3/uL — ABNORMAL HIGH (ref 0.1–1.0)
Monocytes Relative: 10 %
Neutro Abs: 6.6 10*3/uL (ref 1.7–7.7)
Neutrophils Relative %: 59 %
Platelets: 226 10*3/uL (ref 150–400)
RBC: 5.35 MIL/uL (ref 4.22–5.81)
RDW: 13.2 % (ref 11.5–15.5)
WBC: 11.3 10*3/uL — ABNORMAL HIGH (ref 4.0–10.5)
nRBC: 0 % (ref 0.0–0.2)

## 2020-11-28 LAB — URINALYSIS, ROUTINE W REFLEX MICROSCOPIC
Bacteria, UA: NONE SEEN
Bilirubin Urine: NEGATIVE
Glucose, UA: NEGATIVE mg/dL
Ketones, ur: NEGATIVE mg/dL
Nitrite: NEGATIVE
Protein, ur: NEGATIVE mg/dL
RBC / HPF: >50 RBC/hpf — ABNORMAL HIGH (ref 0–5)
Specific Gravity, Urine: 1.016 (ref 1.005–1.030)
pH: 5 (ref 5.0–8.0)

## 2020-11-28 LAB — COMPREHENSIVE METABOLIC PANEL
ALT: 44 U/L (ref 0–44)
AST: 58 U/L — ABNORMAL HIGH (ref 15–41)
Albumin: 3 g/dL — ABNORMAL LOW (ref 3.5–5.0)
Alkaline Phosphatase: 324 U/L — ABNORMAL HIGH (ref 38–126)
Anion gap: 11 (ref 5–15)
BUN: 23 mg/dL (ref 8–23)
CO2: 26 mmol/L (ref 22–32)
Calcium: 8.9 mg/dL (ref 8.9–10.3)
Chloride: 102 mmol/L (ref 98–111)
Creatinine, Ser: 1.42 mg/dL — ABNORMAL HIGH (ref 0.61–1.24)
GFR, Estimated: 47 mL/min — ABNORMAL LOW (ref >60)
Glucose, Bld: 131 mg/dL — ABNORMAL HIGH (ref 70–99)
Potassium: 4.4 mmol/L (ref 3.5–5.1)
Sodium: 139 mmol/L (ref 135–145)
Total Bilirubin: 0.6 mg/dL (ref 0.3–1.2)
Total Protein: 7.4 g/dL (ref 6.5–8.1)

## 2020-11-28 LAB — TROPONIN I (HIGH SENSITIVITY)
Troponin I (High Sensitivity): 19 ng/L — ABNORMAL HIGH (ref <18)
Troponin I (High Sensitivity): 22 ng/L — ABNORMAL HIGH (ref <18)

## 2020-11-28 LAB — RESP PANEL BY RT-PCR (FLU A&B, COVID) ARPGX2
Influenza A by PCR: NEGATIVE
Influenza B by PCR: NEGATIVE
SARS Coronavirus 2 by RT PCR: NEGATIVE

## 2020-11-28 LAB — BRAIN NATRIURETIC PEPTIDE: B Natriuretic Peptide: 363.1 pg/mL — ABNORMAL HIGH (ref 0.0–100.0)

## 2020-11-28 MED ORDER — HEPARIN SODIUM (PORCINE) 5000 UNIT/ML IJ SOLN
5000.0000 [IU] | Freq: Three times a day (TID) | INTRAMUSCULAR | Status: DC
  Administered 2020-11-29 – 2020-12-03 (×14): 5000 [IU] via SUBCUTANEOUS
  Filled 2020-11-28 (×12): qty 1

## 2020-11-28 MED ORDER — SODIUM CHLORIDE 0.9 % IV SOLN
1000.0000 mL | INTRAVENOUS | Status: DC
  Administered 2020-11-28 (×2): 1000 mL via INTRAVENOUS

## 2020-11-28 MED ORDER — PANTOPRAZOLE SODIUM 40 MG PO TBEC
40.0000 mg | DELAYED_RELEASE_TABLET | Freq: Two times a day (BID) | ORAL | Status: DC
  Administered 2020-11-29 – 2020-12-03 (×9): 40 mg via ORAL
  Filled 2020-11-28 (×9): qty 1

## 2020-11-28 MED ORDER — ENOXAPARIN SODIUM 30 MG/0.3ML IJ SOSY: 30.0000 mg | PREFILLED_SYRINGE | INTRAMUSCULAR | Status: DC

## 2020-11-28 MED ORDER — CEPHALEXIN 250 MG PO CAPS
500.0000 mg | ORAL_CAPSULE | Freq: Once | ORAL | Status: AC
  Administered 2020-11-28: 500 mg via ORAL
  Filled 2020-11-28: qty 2

## 2020-11-28 MED ORDER — SODIUM CHLORIDE 0.9 % IV BOLUS (SEPSIS)
500.0000 mL | Freq: Once | INTRAVENOUS | Status: AC
  Administered 2020-11-28: 500 mL via INTRAVENOUS

## 2020-11-28 MED ORDER — SODIUM CHLORIDE 0.9 % IV SOLN
1.0000 g | INTRAVENOUS | Status: DC
  Administered 2020-11-29 – 2020-12-01 (×3): 1 g via INTRAVENOUS
  Filled 2020-11-28 (×4): qty 10

## 2020-11-28 MED ORDER — MIDODRINE HCL 5 MG PO TABS
5.0000 mg | ORAL_TABLET | Freq: Two times a day (BID) | ORAL | Status: DC
  Administered 2020-11-29: 5 mg via ORAL
  Filled 2020-11-28: qty 1

## 2020-11-28 MED ORDER — ADULT MULTIVITAMIN W/MINERALS CH
1.0000 | ORAL_TABLET | Freq: Two times a day (BID) | ORAL | Status: DC
  Administered 2020-11-29 – 2020-12-03 (×9): 1 via ORAL
  Filled 2020-11-28 (×9): qty 1

## 2020-11-28 NOTE — ED Provider Notes (Signed)
Emergency Medicine Provider Triage Evaluation Note  JOESEPH VERVILLE Sr. , a 87 y.o. male  was evaluated in triage.  Pt complains of numerous syncopal episodes. Patient admits to 3 episodes over the past few days. Son is next to patient at triage. Patient denies any preceding lightheadedness or chest pain. He is not currently on any blood thinners. Episodes have been unwitnessed. No history of seizures. No urinary incontinence or mouth trauma.   Review of Systems  Positive: syncope Negative: fever  Physical Exam  BP 105/76 (BP Location: Left Arm)   Pulse (!) 107   Resp 16   SpO2 93%  Gen:   Awake, no distress   Resp:  Normal effort  MSK:   Moves extremities without difficulty  Other:  Numerous skin tears to right upper extremity.  Medical Decision Making  Medically screening exam initiated at 4:33 PM.  Appropriate orders placed.  Ayesha Rumpf Sr. was informed that the remainder of the evaluation will be completed by another provider, this initial triage assessment does not replace that evaluation, and the importance of remaining in the ED until their evaluation is complete.  Numerous syncopal episodes. Labs ordered. X-rays of right upper extremity to rule out bony fractures. CT head and cervical spine ordered.    Karie Kirks 11/28/20 1638    Dorie Rank, MD 11/29/20 [phone]

## 2020-11-28 NOTE — Progress Notes (Signed)
Report received. His room is ready.  °

## 2020-11-28 NOTE — ED Provider Notes (Signed)
I provided a substantive portion of the care of this patient.  I personally performed the entirety of the exam and medical decision making for this encounter.  EKG Interpretation  Date/Time:  Wednesday November 28 2020 16:45:59 EDT Ventricular Rate:  108 PR Interval:  220 QRS Duration: 86 QT Interval:  318 QTC Calculation: 426 R Axis:   34 Text Interpretation: Sinus tachycardia with 1st degree A-V block with occasional and consecutive Premature ventricular complexes and Fusion complexes Low voltage QRS Abnormal ECG No significant change since last tracing Confirmed by Dorie Rank [phone]) on 11/28/2020 5:49:43 PM   Clinical Course as of 11/28/20 2141  Wed Nov 29, 7134  5585 87 year old male presents for evaluation after 3-5 syncopal episodes in the past 4 days.  Patient is found to have multiple skin tears to his right arm with pain in his right hand. Imaging and labs obtained in triage.  Right arm x-rays negative for acute injury.  CT C-spine negative for acute injury, CT head negative for acute injury. Chest x-ray with chronic interstitial disease in the bases emphysema, patient and family deny known history of emphysema.  In regards to the concern for right clavicle fracture, there is no tenderness over this area and patient has good range of motion of the right shoulder without pain. [LM]  1919 Right arm wounds cleaned and dressed with multiple tegaderm dressings. Care signed out at change of shift to Dr. Tomi Bamberger, ER attending, pending remaining results and likely admission for syncope.  [LM]  2129 Initial BNP slightly elevated.  Troponin also slightly elevated 22 [JK]  2129 Creatinine elevated but similar to previous values [JK]  2129 Hemoglobin is increased suggesting possibility of hemoconcentration [JK]  2130 CT scans without acute findings [JK]    Clinical Course User Index [JK] Dorie Rank, MD [LM] Tacy Learn, PA-C   Patient does have evidence of dehydration.  Urinalysis does suggest  UTI.  Will consult with medical service for admission considering his recurrent near syncopal episodes falls.   Dorie Rank, MD 11/28/20 2141

## 2020-11-28 NOTE — ED Triage Notes (Signed)
Pt here with family with reports of multiple syncopal events since Saturday. Pt with edema/bruising to R hand and multiple abrasions to R arm.

## 2020-11-28 NOTE — Progress Notes (Signed)
   Pt is active with [facility].  He enrolled in services on 11/23/20 with sons [name] and [name] present for the initial intake visit.  Will follow hospital course.  Please notify [facility] if we can assist with the discharge plan.  Thank you Wynetta Fines, RN  Office [phone] Mobile [phone]

## 2020-11-28 NOTE — ED Provider Notes (Signed)
[facility] EMERGENCY DEPARTMENT Provider Note   CSN: [id number] Arrival date & time: 11/28/20  1621     History Chief Complaint  Patient presents with   Loss of Consciousness    David Bradshaw Sr. is a 87 y.o. male.  87 year old male presents from home with his son, patient lives alone, reports 3-5 syncopal episodes since Saturday.  Patient states that he will feel flushed and lightheaded and then passed out and hit the floor.  Patient feels like he is out for no more than a few seconds with each episode.  He denies having any chest pain, nausea, vomiting or abdominal pain.  States that his stools have been loose recently but are nonbloody/tarry in appearance.  History of hepatocellular carcinoma, has met with palliative care however family and patient are interested in trying to find out why he is having these syncopal episodes to try to avoid further episodes.  He has multiple skin tears to his right arm and hand, reports pain primarily in his right hand.  He is not anticoagulated.  Also has history of MI, CVA.  Patient was recently hospitalized and found to have low blood pressure, reports compliance with medication for this.  No other injuries, complaints, concerns.      Past Medical History:  Diagnosis Date   Aortic stenosis    mild AS 01/2016 echo   Carotid stenosis    Chicken pox    Choledocholithiasis    Cirrhosis ([place])    Coronary arteriosclerosis due to lipid rich plaque 01/30/2016   DNR (do not resuscitate) 09/13/2020   Edema    Esophagitis    Gastric ulcer    Hepatocellular carcinoma ([place]) 07/05/2020   Myocardial infarct ([place])    1988 and 1990   Stroke [facility])     Patient Active Problem List   Diagnosis Date Noted   Postural dizziness with presyncope 11/28/2020   Hepatocellular carcinoma ([place]) 11/06/2020   Malnutrition of moderate degree 09/15/2020   Acute congestive heart failure ([place]) 09/13/2020   Acute kidney injury superimposed on CKD ([place])  09/13/2020   Foot ulcer, right, limited to breakdown of skin ([place]) 09/13/2020   Class 1 obesity due to excess calories with body mass index (BMI) of 32.0 to 32.9 in adult 09/13/2020   DNR (do not resuscitate) 09/13/2020   Cerumen impaction 08/02/2020   Exertional dyspnea 08/02/2020   Multiple falls 08/02/2020   Cirrhosis of liver with ascites ([place]) 08/02/2020   Jaundice 06/01/2020   Fatigue 06/01/2020   Bilateral lower extremity edema 02/11/2016   Chronic kidney disease, stage 3 ([place]) 02/11/2016   Stroke ([place])    Rash and nonspecific skin eruption    HLD (hyperlipidemia)    Carotid stenosis    History of CVA (cerebrovascular accident) 01/31/2016   TIA (transient ischemic attack) 01/30/2016    Past Surgical History:  Procedure Laterality Date   BIOPSY  07/17/2020   Procedure: BIOPSY;  Surgeon: Irving Copas., MD;  Location: [facility];  Service: Gastroenterology;;   CARDIAC CATHETERIZATION N/A 02/04/2016   Procedure: Left Heart Cath and Coronary Angiography;  Surgeon: Jolaine Artist, MD;  Location: [place] CV LAB;  Service: Cardiovascular;  Laterality: N/A;   CAROTID ENDARTERECTOMY Right 02/14/2016   CATARACT EXTRACTION     ENDARTERECTOMY Right 02/14/2016   Procedure: RIGHT CAROTID ENDARTERECTOMY;  Surgeon: [name], MD;  Location: [place];  Service: Vascular;  Laterality: Right;   ERCP N/A 07/17/2020   Procedure: ENDOSCOPIC RETROGRADE CHOLANGIOPANCREATOGRAPHY (  ERCP);  Surgeon: Mansouraty, Telford Nab., MD;  Location: [facility];  Service: Gastroenterology;  Laterality: N/A;   IR PARACENTESIS  09/14/2020   IR RADIOLOGIST EVAL & MGMT  07/27/2020   PATCH ANGIOPLASTY Right 02/14/2016   Procedure: PATCH ANGIOPLASTY USING XENOSURE BOVINE PERICADIUM PATCH X2;  Surgeon: [name], MD;  Location: [place];  Service: Vascular;  Laterality: Right;   REMOVAL OF STONES  07/17/2020   Procedure: REMOVAL OF STONES;  Surgeon: Irving Copas., MD;   Location: [facility];  Service: Gastroenterology;;   Joan Mayans  07/17/2020   Procedure: Joan Mayans;  Surgeon: Mansouraty, Telford Nab., MD;  Location: [facility];  Service: Gastroenterology;;   testicle     right hydrocele       Family History  Problem Relation Age of Onset   Heart attack Mother    Heart disease Mother    Bone cancer Father    Cancer Father     Social History   Tobacco Use   Smoking status: Former    Packs/day: 1.00    Years: 35.00    Pack years: 35.00    Types: Cigarettes    Quit date: 07/13/1985    Years since quitting: 35.4   Smokeless tobacco: Never  Vaping Use   Vaping Use: Never used  Substance Use Topics   Alcohol use: No   Drug use: No    Home Medications Prior to Admission medications   Medication Sig Start Date End Date Taking? Authorizing Provider  furosemide (LASIX) 20 MG tablet Take 1 tablet (20 mg total) by mouth daily. For leg swelling. Patient taking differently: Take 20 mg by mouth every morning. For leg swelling. 10/05/20  Yes Armbruster, Carlota Raspberry, MD  midodrine (PROAMATINE) 5 MG tablet Take 1 tablet (5 mg total) by mouth 2 (two) times daily with a meal. Patient taking differently: Take 5 mg by mouth 2 (two) times daily. 09/18/20  Yes Barb Merino, MD  Multiple Vitamins-Minerals (ICAPS AREDS FORMULA PO) Take 1 capsule by mouth 2 (two) times daily.   Yes [provider]  omeprazole (PRILOSEC) 40 MG capsule Take 1 capsule (40 mg total) by mouth 2 (two) times daily before a meal. 10/05/20  Yes Armbruster, Carlota Raspberry, MD  spironolactone (ALDACTONE) 50 MG tablet Take 1 tablet (50 mg total) by mouth daily. Patient taking differently: Take 50 mg by mouth every morning. 10/05/20  Yes Armbruster, Carlota Raspberry, MD    Allergies    Atorvastatin, Brilinta [ticagrelor], Plavix [clopidogrel bisulfate], and Tape  Review of Systems   Review of Systems  Constitutional:  Negative for chills and diaphoresis.  Eyes:  Negative for visual  disturbance.  Respiratory:  Negative for shortness of breath.   Cardiovascular:  Negative for chest pain.  Gastrointestinal:  Positive for diarrhea. Negative for abdominal pain, constipation, nausea and vomiting.  Genitourinary:  Negative for difficulty urinating and dysuria.  Musculoskeletal:  Negative for back pain, neck pain and neck stiffness.  Skin:  Positive for wound.  Allergic/Immunologic: Positive for immunocompromised state.  Neurological:  Positive for syncope and light-headedness. Negative for speech difficulty.  Hematological:  Does not bruise/bleed easily.  All other systems reviewed and are negative.  Physical Exam Updated Vital Signs BP 93/66   Pulse 86   Temp 98.6 F (37 C)   Resp 18   Ht 5' (1.524 m)   Wt 63.7 kg   SpO2 93%   BMI 27.43 kg/m   Physical Exam Vitals and nursing note reviewed.  Constitutional:  Appearance: Normal appearance.  HENT:     Head: Normocephalic and atraumatic.     Nose: Nose normal.     Mouth/Throat:     Mouth: Mucous membranes are moist.  Eyes:     Extraocular Movements: Extraocular movements intact.     Pupils: Pupils are equal, round, and reactive to light.  Cardiovascular:     Rate and Rhythm: Normal rate and regular rhythm.     Pulses: Normal pulses.     Heart sounds: Normal heart sounds.  Pulmonary:     Effort: Pulmonary effort is normal.     Breath sounds: Normal breath sounds.  Chest:     Chest wall: No tenderness.  Abdominal:     Palpations: Abdomen is soft.     Tenderness: There is no abdominal tenderness.  Musculoskeletal:        General: Swelling, tenderness and signs of injury present. No deformity.     Right shoulder: Normal.     Left shoulder: Normal.     Left upper arm: Normal.     Left wrist: Normal.     Cervical back: Normal range of motion and neck supple. No tenderness.     Right hip: Normal.     Left hip: Normal.     Right knee: Normal.     Left knee: Normal.     Right lower leg: No edema.      Left lower leg: No edema.     Comments: Multiple skin tears to the right elbow, right forearm, right hand. Swelling and ecchymosis with tenderness to the dorsum of the right hand with sensation intact.  Skin:    General: Skin is warm and dry.     Findings: Bruising present. No erythema or rash.  Neurological:     Mental Status: He is alert and oriented to person, place, and time.  Psychiatric:        Behavior: Behavior normal.    ED Results / Procedures / Treatments   Labs (all labs ordered are listed, but only abnormal results are displayed) Labs Reviewed  CBC WITH DIFFERENTIAL/PLATELET - Abnormal; Notable for the following components:      Result Value   WBC 11.3 (*)    Hemoglobin 17.6 (*)    HCT 52.6 (*)    Monocytes Absolute 1.1 (*)    Basophils Absolute 0.2 (*)    Abs Immature Granulocytes 0.08 (*)    All other components within normal limits  COMPREHENSIVE METABOLIC PANEL - Abnormal; Notable for the following components:   Glucose, Bld 131 (*)    Creatinine, Ser 1.42 (*)    Albumin 3.0 (*)    AST 58 (*)    Alkaline Phosphatase 324 (*)    GFR, Estimated 47 (*)    All other components within normal limits  BRAIN NATRIURETIC PEPTIDE - Abnormal; Notable for the following components:   B Natriuretic Peptide 363.1 (*)    All other components within normal limits  URINALYSIS, ROUTINE W REFLEX MICROSCOPIC - Abnormal; Notable for the following components:   APPearance HAZY (*)    Hgb urine dipstick MODERATE (*)    Leukocytes,Ua SMALL (*)    RBC / HPF >50 (*)    All other components within normal limits  BASIC METABOLIC PANEL - Abnormal; Notable for the following components:   Glucose, Bld 103 (*)    Calcium 8.1 (*)    GFR, Estimated 58 (*)    All other components within normal limits  AMMONIA - Abnormal;  Notable for the following components:   Ammonia 56 (*)    All other components within normal limits  COMPREHENSIVE METABOLIC PANEL - Abnormal; Notable for the  following components:   Calcium 8.2 (*)    Total Protein 5.4 (*)    Albumin 2.2 (*)    Alkaline Phosphatase 239 (*)    All other components within normal limits  TROPONIN I (HIGH SENSITIVITY) - Abnormal; Notable for the following components:   Troponin I (High Sensitivity) 19 (*)    All other components within normal limits  TROPONIN I (HIGH SENSITIVITY) - Abnormal; Notable for the following components:   Troponin I (High Sensitivity) 22 (*)    All other components within normal limits  RESP PANEL BY RT-PCR (FLU A&B, COVID) ARPGX2  URINE CULTURE  CBC  MAGNESIUM  PHOSPHORUS  MAGNESIUM  PHOSPHORUS  CBC  COMPREHENSIVE METABOLIC PANEL  MAGNESIUM  PHOSPHORUS  CBC  AMMONIA    EKG EKG Interpretation  Date/Time:  Wednesday November 28 2020 16:45:59 EDT Ventricular Rate:  108 PR Interval:  220 QRS Duration: 86 QT Interval:  318 QTC Calculation: 426 R Axis:   34 Text Interpretation: Sinus tachycardia with 1st degree A-V block with occasional and consecutive Premature ventricular complexes and Fusion complexes Low voltage QRS Abnormal ECG No significant change since last tracing Confirmed by Dorie Rank [phone]) on 11/28/2020 5:49:43 PM  Radiology ECHOCARDIOGRAM COMPLETE  Result Date: 11/29/2020    ECHOCARDIOGRAM REPORT   Patient Name:   David Bradshaw Sr. Date of Exam: 11/29/2020 Medical Rec #:  [id number]           Height:       60.0 in Accession #:    [id number]          Weight:       140.4 lb Date of Birth:  08-11-1926          BSA:          1.606 m Patient Age:    89 years            BP:           92/61 mmHg Patient Gender: M                   HR:           56 bpm. Exam Location:  Inpatient Procedure: 2D Echo, Color Doppler and Cardiac Doppler Indications:    R55 Syncope  History:        Patient has prior history of Echocardiogram examinations, most                 recent 08/27/2020. Risk Factors:Dyslipidemia.  Sonographer:    Raquel Sarna Senior RDCS Referring Phys: [id number] [place]  1. Left ventricular ejection fraction, by estimation, is 60 to 65%. The left ventricle has normal function. Left ventricular endocardial border not optimally defined to evaluate regional wall motion. Left ventricular diastolic function could not be evaluated.  2. Right ventricular systolic function is normal. The right ventricular size is normal. Tricuspid regurgitation signal is inadequate for assessing PA pressure.  3. The mitral valve is normal in structure. Trivial mitral valve regurgitation. No evidence of mitral stenosis.  4. The AV is poorly visualized. There appears to be some degree of aortic stenosis possibly mild to moderate by doppler but cannot estimate with accuracy. Consider TEE for repeat limited echo of the AV to try to acquire clearer images. Aortic valve regurgitation is trivial. Aortic  valve mean gradient measures 22.0 mmHg. Aortic valve Vmax measures 2.97 m/s.  5. The inferior vena cava is normal in size with greater than 50% respiratory variability, suggesting right atrial pressure of 3 mmHg.  6. There is a prominent epicardial fat pad and trivial pericardial effusion anterior to the right ventricle. FINDINGS  Left Ventricle: Left ventricular ejection fraction, by estimation, is 60 to 65%. The left ventricle has normal function. Left ventricular endocardial border not optimally defined to evaluate regional wall motion. The left ventricular internal cavity size was normal in size. There is no left ventricular hypertrophy. Left ventricular diastolic function could not be evaluated. Right Ventricle: The right ventricular size is normal. No increase in right ventricular wall thickness. Right ventricular systolic function is normal. Tricuspid regurgitation signal is inadequate for assessing PA pressure. Left Atrium: Left atrial size was normal in size. Right Atrium: Right atrial size was normal in size. Pericardium: Trivial pericardial effusion is present. The pericardial effusion is  anterior to the right ventricle. Presence of pericardial fat pad. Mitral Valve: The mitral valve is normal in structure. Mild to moderate mitral annular calcification. Trivial mitral valve regurgitation. No evidence of mitral valve stenosis. Tricuspid Valve: The tricuspid valve is normal in structure. Tricuspid valve regurgitation is not demonstrated. No evidence of tricuspid stenosis. Aortic Valve: The AV is poorly visualized. There appears to be some degree of aortic stenosis by doppler but cannot estimate with accuracy. Consider TEE for repeat limited echo of the AV to try to acquire clearer images. The aortic valve was not well visualized. Aortic valve regurgitation is trivial. Mild to moderate aortic stenosis is present. Aortic valve mean gradient measures 22.0 mmHg. Aortic valve peak gradient measures 35.3 mmHg. Aortic valve area, by VTI measures 0.54 cm. Pulmonic Valve: The pulmonic valve was not well visualized. Pulmonic valve regurgitation is trivial. No evidence of pulmonic stenosis. Aorta: The aortic root is normal in size and structure. Venous: The inferior vena cava is normal in size with greater than 50% respiratory variability, suggesting right atrial pressure of 3 mmHg. IAS/Shunts: The interatrial septum appears to be lipomatous. No atrial level shunt detected by color flow Doppler.  LEFT VENTRICLE PLAX 2D LVIDd:         4.80 cm  Diastology LVIDs:         3.10 cm  LV e' medial:  4.68 cm/s LV PW:         1.00 cm  LV e' lateral: 4.90 cm/s LV IVS:        1.10 cm LVOT diam:     1.90 cm LV SV:         42 LV SV Index:   26 LVOT Area:     2.84 cm  RIGHT VENTRICLE RV S prime:     7.83 cm/s TAPSE (M-mode): 1.8 cm LEFT ATRIUM             Index       RIGHT ATRIUM           Index LA Biplane Vol: 67.0 ml 41.71 ml/m RA Area:     15.10 cm                                     RA Volume:   37.50 ml  23.35 ml/m  AORTIC VALVE AV Area (Vmax):    0.64 cm AV Area (Vmean):   0.61 cm AV Area (VTI):  0.54 cm AV  Vmax:           297.00 cm/s AV Vmean:          228.000 cm/s AV VTI:            0.763 m AV Peak Grad:      35.3 mmHg AV Mean Grad:      22.0 mmHg LVOT Vmax:         67.20 cm/s LVOT Vmean:        48.950 cm/s LVOT VTI:          0.147 m LVOT/AV VTI ratio: 0.19  AORTA Ao Root diam: 3.40 cm  SHUNTS Systemic VTI:  0.15 m Systemic Diam: 1.90 cm Fransico Him MD Electronically signed by Fransico Him MD Signature Date/Time: 11/29/2020/3:14:04 PM    Final     Procedures Procedures   Medications Ordered in ED Medications  multivitamin with minerals tablet 1 tablet (1 tablet Oral Given 11/30/20 0843)  pantoprazole (PROTONIX) EC tablet 40 mg (40 mg Oral Given 11/30/20 0843)  cefTRIAXone (ROCEPHIN) 1 g in sodium chloride 0.9 % 100 mL IVPB (1 g Intravenous New Bag/Given 11/29/20 2127)  heparin injection 5,000 Units (5,000 Units Subcutaneous Given 11/30/20 1450)  sodium chloride 0.9 % bolus 500 mL (0 mLs Intravenous Stopped 11/28/20 2053)    Followed by  0.9 %  sodium chloride infusion ( Intravenous Rate/Dose Change 11/29/20 0212)  lactulose (CHRONULAC) 10 GM/15ML solution 10 g (10 g Oral Given 11/30/20 0843)  midodrine (PROAMATINE) tablet 10 mg (10 mg Oral Given 11/30/20 1710)  albumin human 25 % solution 50 g (has no administration in time range)  cephALEXin (KEFLEX) capsule 500 mg (500 mg Oral Given 11/28/20 2142)    ED Course  I have reviewed the triage vital signs and the nursing notes.  Pertinent labs & imaging results that were available during my care of the patient were reviewed by me and considered in my medical decision making (see chart for details).  Clinical Course as of 11/30/20 1716  Wed Nov 28, 4377  5226 87 year old male presents for evaluation after 3-5 syncopal episodes in the past 4 days.  Patient is found to have multiple skin tears to his right arm with pain in his right hand. Imaging and labs obtained in triage.  Right arm x-rays negative for acute injury.  CT C-spine negative for acute  injury, CT head negative for acute injury. Chest x-ray with chronic interstitial disease in the bases emphysema, patient and family deny known history of emphysema.  In regards to the concern for right clavicle fracture, there is no tenderness over this area and patient has good range of motion of the right shoulder without pain. [LM]  1919 Right arm wounds cleaned and dressed with multiple tegaderm dressings. Care signed out at change of shift to Dr. Tomi Bamberger, ER attending, pending remaining results and likely admission for syncope.  [LM]  2129 Initial BNP slightly elevated.  Troponin also slightly elevated 22 [JK]  2129 Creatinine elevated but similar to previous values [JK]  2129 Hemoglobin is increased suggesting possibility of hemoconcentration [JK]  2130 CT scans without acute findings [JK]    Clinical Course User Index [JK] Dorie Rank, MD [LM] Roque Lias   MDM Rules/Calculators/A&P                           Final Clinical Impression(s) / ED Diagnoses Final diagnoses:  Syncope and collapse  Multiple  skin tears  Acute cystitis without hematuria    Rx / DC Orders ED Discharge Orders     None        Roque Lias 11/30/20 1716    Dorie Rank, MD 12/01/20 [phone]

## 2020-11-28 NOTE — H&P (Signed)
History and Physical  David JACUINDE Sr. QQV:[id number] DOB: 07/29/1926 DOA: 11/28/2020  Referring physician: Dr. Tomi Bamberger, [place]. PCP: Pleas Koch, NP  Outpatient Specialists: GI. Patient coming from: Home.    Chief Complaint: Recurrent presyncopal episodes, generalized weakness.  HPI: David POPOFF Sr. is a 87 y.o. male with medical history significant for TIA, HFpEF 50 to 55%, multiple gastric ulcers, severe esophagitis, hepatocellular carcinoma, liver cirrhosis with ascites with no prior history of alcohol abuse, hepatitis or autoimmune disorder, recent admission on April 2022 due to volume overload and orthostatic hypotension, discharged from the hospital after a week of hospitalization went to rehab for 3 weeks then returned home where he lives alone, his son lives 15 minutes away.  Patient presents today due to recurrent falls and presyncopal episodes.  Associated with generalized weakness, the feeling that he is about to pass out when he stands and falls.  Reported loose stools intermittently.  No cardiopulmonary symptoms.  Denies fever chills or headaches.  Work-up in the ED revealed orthostatic hypotension.  Concern for dehydration with elevated creatinine, possible UTI.  EDP requested admission.  TRH, hospitalist team, was asked to admit.  ED Course:  Temperature 97.9.  BP 97/82, pulse 89, respiration 18, O2 saturation 92% on room air.  Lab studies remarkable for serum bicarb 26, glucose 131, BUN 23, creatinine 1.42, anion gap 11, GFR 47.  BNP 363, troponin 22 from 19.  WBC 11.3, hemoglobin 17.6, MCV 98, platelet 226.  Urine analysis WBC UA 11-20, leukocyte UA small.  Review of Systems: Review of systems as noted in the HPI. All other systems reviewed and are negative.   Past Medical History:  Diagnosis Date   Aortic stenosis    mild AS 01/2016 echo   Carotid stenosis    Chicken pox    Choledocholithiasis    Cirrhosis (HCC)    Coronary arteriosclerosis due to lipid rich  plaque 01/30/2016   DNR (do not resuscitate) 09/13/2020   Edema    Esophagitis    Gastric ulcer    Hepatocellular carcinoma ([place]) 07/05/2020   Myocardial infarct ([place])    1988 and 1990   Stroke [facility])    Past Surgical History:  Procedure Laterality Date   BIOPSY  07/17/2020   Procedure: BIOPSY;  Surgeon: Irving Copas., MD;  Location: [facility];  Service: Gastroenterology;;   CARDIAC CATHETERIZATION N/A 02/04/2016   Procedure: Left Heart Cath and Coronary Angiography;  Surgeon: Jolaine Artist, MD;  Location: [facility];  Service: Cardiovascular;  Laterality: N/A;   CAROTID ENDARTERECTOMY Right 02/14/2016   CATARACT EXTRACTION     ENDARTERECTOMY Right 02/14/2016   Procedure: RIGHT CAROTID ENDARTERECTOMY;  Surgeon: [name], MD;  Location: [facility];  Service: Vascular;  Laterality: Right;   ERCP N/A 07/17/2020   Procedure: ENDOSCOPIC RETROGRADE CHOLANGIOPANCREATOGRAPHY (ERCP);  Surgeon: Irving Copas., MD;  Location: [facility];  Service: Gastroenterology;  Laterality: N/A;   IR PARACENTESIS  09/14/2020   IR RADIOLOGIST EVAL & MGMT  07/27/2020   PATCH ANGIOPLASTY Right 02/14/2016   Procedure: PATCH ANGIOPLASTY USING XENOSURE BOVINE PERICADIUM PATCH X2;  Surgeon: [name], MD;  Location: [place];  Service: Vascular;  Laterality: Right;   REMOVAL OF STONES  07/17/2020   Procedure: REMOVAL OF STONES;  Surgeon: Irving Copas., MD;  Location: [facility];  Service: Gastroenterology;;   Joan Mayans  07/17/2020   Procedure: Joan Mayans;  Surgeon: Irving Copas., MD;  Location: [facility];  Service: Gastroenterology;;  testicle     right hydrocele    Social History:  reports that he quit smoking about 35 years ago. He has a 35.00 pack-year smoking history. He has never used smokeless tobacco. He reports that he does not drink alcohol and does not use drugs.   Allergies  Allergen Reactions   Tape Other (See Comments)     SKIN IS VERY THIN- WILL TEAR EASILY!! PLEASE USE PAPER TAPE   Atorvastatin Rash and Other (See Comments)    Patient started on Plavix and Lipitor at the same time and developed a rash   Brilinta [Ticagrelor] Hives   Plavix [Clopidogrel Bisulfate] Rash and Other (See Comments)    Patient started on plavix and lipitor at the same time and developed a rash    Family History  Problem Relation Age of Onset   Heart attack Mother    Heart disease Mother    Bone cancer Father    Cancer Father       Prior to Admission medications   Medication Sig Start Date End Date Taking? Authorizing Provider  furosemide (LASIX) 20 MG tablet Take 1 tablet (20 mg total) by mouth daily. For leg swelling. 10/05/20   Armbruster, Carlota Raspberry, MD  midodrine (PROAMATINE) 5 MG tablet Take 1 tablet (5 mg total) by mouth 2 (two) times daily with a meal. Patient taking differently: Take 5 mg by mouth daily. 09/18/20   Barb Merino, MD  Multiple Vitamins-Minerals (ICAPS AREDS FORMULA PO) Take 1 capsule by mouth 2 (two) times daily.    [provider]  omeprazole (PRILOSEC) 40 MG capsule Take 1 capsule (40 mg total) by mouth 2 (two) times daily before a meal. 10/05/20   Armbruster, Carlota Raspberry, MD  spironolactone (ALDACTONE) 50 MG tablet Take 1 tablet (50 mg total) by mouth daily. 10/05/20   Yetta Flock, MD    Physical Exam: BP 104/73   Pulse 87   Temp 97.9 F (36.6 C) (Oral) Comment: Simultaneous filing. User may not have seen previous data. Comment (Src): Simultaneous filing. User may not have seen previous data.  Resp 17   SpO2 (!) 87%   General: 87 y.o. year-old male well developed well nourished in no acute distress.  Alert and oriented x2. Cardiovascular: Regular rate and rhythm with no rubs or gallops.  No thyromegaly or JVD noted.  Trace lower extremity edema.  Respiratory: Clear to auscultation with no wheezes or rales. Good inspiratory effort. Abdomen: Soft nontender mildly distended with  normal bowel sounds x4 quadrants. Muskuloskeletal: No cyanosis or clubbing.  Trace lower extremity edema noted bilaterally Neuro: CN II-XII intact, strength, sensation, reflexes Skin: No ulcerative lesions noted or rashes Psychiatry: Judgement and insight appear altered. Mood is appropriate for condition and setting          Labs on Admission:  Basic Metabolic Panel: Recent Labs  Lab 11/28/20 1632  NA 139  K 4.4  CL 102  CO2 26  GLUCOSE 131*  BUN 23  CREATININE 1.42*  CALCIUM 8.9   Liver Function Tests: Recent Labs  Lab 11/28/20 1632  AST 58*  ALT 44  ALKPHOS 324*  BILITOT 0.6  PROT 7.4  ALBUMIN 3.0*   No results for input(s): LIPASE, AMYLASE in the last 168 hours. No results for input(s): AMMONIA in the last 168 hours. CBC: Recent Labs  Lab 11/28/20 1632  WBC 11.3*  NEUTROABS 6.6  HGB 17.6*  HCT 52.6*  MCV 98.3  PLT 226   Cardiac Enzymes: No  results for input(s): CKTOTAL, CKMB, CKMBINDEX, TROPONINI in the last 168 hours.  BNP (last 3 results) Recent Labs    09/13/20 1323 11/28/20 1632  BNP 207.3* 363.1*    ProBNP (last 3 results) Recent Labs    07/23/20 1023 08/02/20 1202  PROBNP 427.0* 354.0*    CBG: No results for input(s): GLUCAP in the last 168 hours.  Radiological Exams on Admission: DG Chest 2 View  Result Date: 11/28/2020 CLINICAL DATA:  Syncope EXAM: CHEST - 2 VIEW COMPARISON:  09/13/2020, 06/06/2020 FINDINGS: Mild reticular opacity at the bases likely due to chronic disease. Emphysema. Trace left-sided pleural effusion. Convex opacity at the right mid to lower mediastinal silhouette potentially due to aortic tortuosity augmented by rotated image. No pleural effusion or pneumothorax. There is possible right clavicle fracture deformity. IMPRESSION: 1. Probable chronic interstitial disease at the bases with emphysema. There is a small left effusion 2. Convex right mediastinal opacity probably due to aortic tortuosity and augmented by  rotated image. CT chest follow-up if deemed clinically appropriate 3. Possible fracture deformity of the distal right clavicle Electronically Signed   By: Donavan Foil M.D.   On: 11/28/2020 17:11   DG Elbow Complete Right  Result Date: 11/28/2020 CLINICAL DATA:  Syncope EXAM: RIGHT ELBOW - COMPLETE 3+ VIEW COMPARISON:  None. FINDINGS: No definitive fracture or malalignment. Posterior elbow soft tissue swelling. Possible small elbow effusion IMPRESSION: 1. No definite acute osseous abnormality. There is possible small elbow effusion, consider short interval radiographic follow-up to exclude occult fracture Electronically Signed   By: Donavan Foil M.D.   On: 11/28/2020 17:12   DG Forearm Right  Result Date: 11/28/2020 CLINICAL DATA:  Syncopal episodes multiple fall EXAM: RIGHT FOREARM - 2 VIEW COMPARISON:  None. FINDINGS: There is no evidence of fracture or other focal bone lesions. Soft tissues are unremarkable. IMPRESSION: Negative. Electronically Signed   By: Donavan Foil M.D.   On: 11/28/2020 17:13   CT Head Wo Contrast  Result Date: 11/28/2020 CLINICAL DATA:  Head trauma.  Multiple syncopal episodes. EXAM: CT HEAD WITHOUT CONTRAST TECHNIQUE: Contiguous axial images were obtained from the base of the skull through the vertex without intravenous contrast. COMPARISON:  None. FINDINGS: Brain: No evidence of acute infarction, hemorrhage, hydrocephalus, extra-axial collection or mass lesion/mass effect. Marked parenchymal volume loss and deep white matter microangiopathy. Stable encephalomalacia of the right parietal lobe, likely from prior ischemic infarction. Vascular: No hyperdense vessel or unexpected calcification. Skull: Calcific atherosclerotic disease of the intra cavernous carotid arteries. Sinuses/Orbits: No acute finding. Other: None. IMPRESSION: 1. No acute intracranial abnormality. 2. Atrophy, chronic microvascular disease. 3. Stable encephalomalacia of the right parietal lobe, likely from  prior ischemic infarction. Electronically Signed   By: Fidela Salisbury M.D.   On: 11/28/2020 17:18   CT Cervical Spine Wo Contrast  Result Date: 11/28/2020 CLINICAL DATA:  Post fall from multiple syncopal episodes. EXAM: CT CERVICAL SPINE WITHOUT CONTRAST TECHNIQUE: Multidetector CT imaging of the cervical spine was performed without intravenous contrast. Multiplanar CT image reconstructions were also generated. COMPARISON:  None. FINDINGS: Alignment: Anterolisthesis of C2 on C3 posterior listhesis of C5 on C6. Skull base and vertebrae: No acute fracture. No primary bone lesion or focal pathologic process. Soft tissues and spinal canal: No prevertebral fluid or swelling. No visible canal hematoma. Disc levels: Multilevel osteoarthritic changes with disc space narrowing, remodeling of vertebral bodies, osteophytosis and posterior facet arthropathy. Upper chest: Negative. Other: None. IMPRESSION: 1. No evidence of acute traumatic injury to the  cervical spine. 2. Anterolisthesis of C2 on C3 and posterior listhesis of C5 on C6, likely degenerative. 3. Multilevel osteoarthritic changes of the cervical spine. Electronically Signed   By: Fidela Salisbury M.D.   On: 11/28/2020 17:22   DG Hand Complete Right  Result Date: 11/28/2020 CLINICAL DATA:  Syncopal episodes with multiple fall EXAM: RIGHT HAND - COMPLETE 3+ VIEW COMPARISON:  None. FINDINGS: No fracture or malalignment. Scattered degenerative change within the digits of the right hand. Moderate arthritis at the STT interval. IMPRESSION: No acute osseous abnormality Electronically Signed   By: Donavan Foil M.D.   On: 11/28/2020 17:14    EKG: I independently viewed the EKG done and my findings are as followed: Sinus tachycardia rate of 108, occasional PVCs, first-degree AV block, QTC 426.  Assessment/Plan Present on Admission: **None**  Active Problems:   Postural dizziness with presyncope  Postural dizziness with presyncope Presented with  generalized weakness and recurrent falls Positive orthostatic vital signs Continue gentle IV fluid hydration, normal saline at 50 cc/h x 1 day. Continue fall precautions PT OT to assess  Orthostatic hypotension, suspect autonomic dysfunction Resume home midodrine Maintain MAP greater than 65 Continue IV fluid hydration Repeat orthostatic vital signs in the morning Continue fall precautions  Presumptive UTI, POA UA equivocal Follow urine culture Rocephin empirically, DC antibiotics if urine culture is negative.  Acute metabolic encephalopathy Unclear of baseline mentation Mild confusion at time of this exam. Obtain ammonia level Lactulose daily  Hepatocellular carcinoma/cirrhosis with ascites No prior history of alcohol use or hepatitis disease or autoimmune disorder. Follows with GI outpatient. Poor prognosis Palliative care team consult to assist with establishing goals of care.  History of esophagitis Resume home PPI twice daily.  Chronic lower extremity edema Trace edema in lower extremities bilaterally on exam. He is on p.o. Lasix and p.o. spironolactone at home Elevated BNP however no clear evidence of volume overload on exam. Last 2D echo was on March 2022 revealing LVEF 50 to 55% Start strict I's and O's and daily weight Hold off diuretics for now due to orthostatic hypotension.  Chronic diastolic CHF Euvolemic on exam Strict I's and O's and daily weight Diuretics on hold due to orthostatic hypotension. Closely monitor volume status while on gentle IV fluid hydration normal saline at 50 cc/h x 1 day.   DVT prophylaxis: Subcu heparin 3 times daily.  Code Status: Full code, please reassess CODE STATUS once the patient is alert and oriented x3.  Family Communication: None at bedside.  Disposition Plan: Admit to telemetry medical.  Consults called: None.  Admission status: Observation status.   Status is: Observation    Dispo:  Patient From:  Home  Planned Disposition: Home, possibly on 11/29/2020.  Medically stable for discharge: No      Kayleen Memos MD [facility] Pager [phone]  If 7PM-7AM, please contact night-coverage [email or web address] Password [facility]  11/28/2020, 10:18 PM

## 2020-11-29 ENCOUNTER — Observation Stay (HOSPITAL_COMMUNITY): Payer: Medicare HMO

## 2020-11-29 DIAGNOSIS — Z888 Allergy status to other drugs, medicaments and biological substances status: Secondary | ICD-10-CM | POA: Diagnosis not present

## 2020-11-29 DIAGNOSIS — I350 Nonrheumatic aortic (valve) stenosis: Secondary | ICD-10-CM | POA: Diagnosis not present

## 2020-11-29 DIAGNOSIS — Z7189 Other specified counseling: Secondary | ICD-10-CM | POA: Diagnosis not present

## 2020-11-29 DIAGNOSIS — Z8673 Personal history of transient ischemic attack (TIA), and cerebral infarction without residual deficits: Secondary | ICD-10-CM | POA: Diagnosis not present

## 2020-11-29 DIAGNOSIS — S41111A Laceration without foreign body of right upper arm, initial encounter: Secondary | ICD-10-CM | POA: Diagnosis not present

## 2020-11-29 DIAGNOSIS — R296 Repeated falls: Secondary | ICD-10-CM | POA: Diagnosis present

## 2020-11-29 DIAGNOSIS — G9341 Metabolic encephalopathy: Secondary | ICD-10-CM | POA: Diagnosis not present

## 2020-11-29 DIAGNOSIS — R69 Illness, unspecified: Secondary | ICD-10-CM | POA: Diagnosis not present

## 2020-11-29 DIAGNOSIS — Z20822 Contact with and (suspected) exposure to covid-19: Secondary | ICD-10-CM | POA: Diagnosis not present

## 2020-11-29 DIAGNOSIS — K209 Esophagitis, unspecified without bleeding: Secondary | ICD-10-CM | POA: Diagnosis present

## 2020-11-29 DIAGNOSIS — Z8505 Personal history of malignant neoplasm of liver: Secondary | ICD-10-CM | POA: Diagnosis not present

## 2020-11-29 DIAGNOSIS — I2510 Atherosclerotic heart disease of native coronary artery without angina pectoris: Secondary | ICD-10-CM | POA: Diagnosis present

## 2020-11-29 DIAGNOSIS — N3000 Acute cystitis without hematuria: Secondary | ICD-10-CM | POA: Diagnosis not present

## 2020-11-29 DIAGNOSIS — N390 Urinary tract infection, site not specified: Secondary | ICD-10-CM | POA: Diagnosis not present

## 2020-11-29 DIAGNOSIS — I252 Old myocardial infarction: Secondary | ICD-10-CM | POA: Diagnosis not present

## 2020-11-29 DIAGNOSIS — J439 Emphysema, unspecified: Secondary | ICD-10-CM | POA: Diagnosis present

## 2020-11-29 DIAGNOSIS — I951 Orthostatic hypotension: Secondary | ICD-10-CM | POA: Diagnosis not present

## 2020-11-29 DIAGNOSIS — R531 Weakness: Secondary | ICD-10-CM | POA: Diagnosis not present

## 2020-11-29 DIAGNOSIS — I440 Atrioventricular block, first degree: Secondary | ICD-10-CM | POA: Diagnosis present

## 2020-11-29 DIAGNOSIS — E860 Dehydration: Secondary | ICD-10-CM | POA: Diagnosis present

## 2020-11-29 DIAGNOSIS — I5032 Chronic diastolic (congestive) heart failure: Secondary | ICD-10-CM | POA: Diagnosis not present

## 2020-11-29 DIAGNOSIS — T148XXA Other injury of unspecified body region, initial encounter: Secondary | ICD-10-CM | POA: Diagnosis not present

## 2020-11-29 DIAGNOSIS — C220 Liver cell carcinoma: Secondary | ICD-10-CM | POA: Diagnosis not present

## 2020-11-29 DIAGNOSIS — K7460 Unspecified cirrhosis of liver: Secondary | ICD-10-CM | POA: Diagnosis not present

## 2020-11-29 DIAGNOSIS — R188 Other ascites: Secondary | ICD-10-CM | POA: Diagnosis not present

## 2020-11-29 DIAGNOSIS — R58 Hemorrhage, not elsewhere classified: Secondary | ICD-10-CM | POA: Diagnosis not present

## 2020-11-29 DIAGNOSIS — I471 Supraventricular tachycardia: Secondary | ICD-10-CM | POA: Diagnosis not present

## 2020-11-29 DIAGNOSIS — R55 Syncope and collapse: Secondary | ICD-10-CM | POA: Diagnosis not present

## 2020-11-29 DIAGNOSIS — Z66 Do not resuscitate: Secondary | ICD-10-CM | POA: Diagnosis not present

## 2020-11-29 DIAGNOSIS — Z9109 Other allergy status, other than to drugs and biological substances: Secondary | ICD-10-CM | POA: Diagnosis not present

## 2020-11-29 DIAGNOSIS — R42 Dizziness and giddiness: Secondary | ICD-10-CM | POA: Diagnosis not present

## 2020-11-29 LAB — BASIC METABOLIC PANEL
Anion gap: 8 (ref 5–15)
BUN: 20 mg/dL (ref 8–23)
CO2: 23 mmol/L (ref 22–32)
Calcium: 8.1 mg/dL — ABNORMAL LOW (ref 8.9–10.3)
Chloride: 109 mmol/L (ref 98–111)
Creatinine, Ser: 1.18 mg/dL (ref 0.61–1.24)
GFR, Estimated: 58 mL/min — ABNORMAL LOW (ref >60)
Glucose, Bld: 103 mg/dL — ABNORMAL HIGH (ref 70–99)
Potassium: 4.1 mmol/L (ref 3.5–5.1)
Sodium: 140 mmol/L (ref 135–145)

## 2020-11-29 LAB — ECHOCARDIOGRAM COMPLETE
AR max vel: 0.64 cm2
AV Area VTI: 0.54 cm2
AV Area mean vel: 0.61 cm2
AV Mean grad: 22 mmHg
AV Peak grad: 35.3 mmHg
Ao pk vel: 2.97 m/s
Height: 60 in
S' Lateral: 3.1 cm
Weight: 2246.93 oz

## 2020-11-29 LAB — CBC
HCT: 46.9 % (ref 39.0–52.0)
Hemoglobin: 15.9 g/dL (ref 13.0–17.0)
MCH: 33.1 pg (ref 26.0–34.0)
MCHC: 33.9 g/dL (ref 30.0–36.0)
MCV: 97.5 fL (ref 80.0–100.0)
Platelets: 166 10*3/uL (ref 150–400)
RBC: 4.81 MIL/uL (ref 4.22–5.81)
RDW: 13.1 % (ref 11.5–15.5)
WBC: 7.6 10*3/uL (ref 4.0–10.5)
nRBC: 0 % (ref 0.0–0.2)

## 2020-11-29 LAB — PHOSPHORUS: Phosphorus: 3.1 mg/dL (ref 2.5–4.6)

## 2020-11-29 LAB — MAGNESIUM: Magnesium: 1.9 mg/dL (ref 1.7–2.4)

## 2020-11-29 LAB — AMMONIA: Ammonia: 56 umol/L — ABNORMAL HIGH (ref 9–35)

## 2020-11-29 MED ORDER — LACTULOSE 10 GM/15ML PO SOLN
10.0000 g | Freq: Every day | ORAL | Status: DC
  Administered 2020-11-29 – 2020-12-03 (×5): 10 g via ORAL
  Filled 2020-11-29 (×5): qty 15

## 2020-11-29 MED ORDER — MIDODRINE HCL 5 MG PO TABS
10.0000 mg | ORAL_TABLET | Freq: Three times a day (TID) | ORAL | Status: DC
  Administered 2020-11-29 – 2020-12-03 (×13): 10 mg via ORAL
  Filled 2020-11-29 (×13): qty 2

## 2020-11-29 MED ORDER — SODIUM CHLORIDE 0.9 % IV SOLN: 1000.0000 mL | INTRAVENOUS | Status: DC

## 2020-11-29 NOTE — Evaluation (Signed)
Occupational Therapy Evaluation Patient Details Name: David GAUTREAU Sr. MRN: [id number] DOB: 07-23-1926 Today's Date: 11/29/2020    History of Present Illness David Bradshaw. is a 87 y.o. male who presents due to recurrent falls and presyncopal episodes on 6/15.  Associated with generalized weaknesswith medical history significant for TIA, HFpEF 50 to 55%, multiple gastric ulcers, severe esophagitis, hepatocellular carcinoma, liver cirrhosis with ascites with no prior history of alcohol abuse, hepatitis or autoimmune disorder, recent admission on April 2022 due to volume overload and orthostatic hypotension, discharged from the hospital after a week of hospitalization went to rehab for 3 weeks then returned home where he lives alone, his son lives 15 minutes away.   Clinical Impression   Pt was ambulating with a RW, sponge bathing and preparing simple meals independently prior to admission. His family assisted with IADL. Pt presents with generalized weakness and impaired balance. He needs up to min assist for ADL and mobility. Pt has poor visual acuity. Plan is for pt to go to his son's home upon discharge for a fews days prior to return home. Will follow acutely.     Follow Up Recommendations  Home health OT;Supervision/Assistance - 24 hour (initially)    Equipment Recommendations  None recommended by OT    Recommendations for Other Services       Precautions / Restrictions Precautions Precautions: Fall Precaution Comments: monitor BP, pt becomes dizzy with change in positions; pt legally blind      Mobility Bed Mobility Overal bed mobility: Needs Assistance Bed Mobility: Supine to Sit;Sit to Supine     Supine to sit: Min assist Sit to supine: Min assist   General bed mobility comments: assist to raise trunk and for LEs back into bed    Transfers Overall transfer level: Needs assistance Equipment used: Rolling walker (2 wheeled) Transfers: Sit to/from Stand Sit to  Stand: Min assist         General transfer comment: assist to rise and steady    Balance Overall balance assessment: Needs assistance Sitting-balance support: Feet supported Sitting balance-Leahy Scale: Fair     Standing balance support: Bilateral upper extremity supported Standing balance-Leahy Scale: Poor Standing balance comment: reliant on B UE support                           ADL either performed or assessed with clinical judgement   ADL Overall ADL's : Needs assistance/impaired Eating/Feeding: Set up;Sitting   Grooming: Set up;Sitting   Upper Body Bathing: Set up;Sitting   Lower Body Bathing: Minimal assistance;Sit to/from stand   Upper Body Dressing : Set up;Sitting   Lower Body Dressing: Minimal assistance;Sit to/from stand   Toilet Transfer: Minimal assistance;Ambulation;RW   Toileting- Clothing Manipulation and Hygiene: Minimal assistance;Sit to/from stand       Functional mobility during ADLs: Minimal assistance;Rolling walker       Vision Baseline Vision/History: Legally blind;Macular Degeneration Patient Visual Report: No change from baseline       Perception     Praxis      Pertinent Vitals/Pain Pain Assessment: No/denies pain     Hand Dominance Right   Extremity/Trunk Assessment Upper Extremity Assessment Upper Extremity Assessment: Generalized weakness   Lower Extremity Assessment Lower Extremity Assessment: Generalized weakness   Cervical / Trunk Assessment Cervical / Trunk Assessment: Kyphotic   Communication Communication Communication: HOH   Cognition Arousal/Alertness: Awake/alert Behavior During Therapy: WFL for tasks assessed/performed Overall Cognitive Status: Within Functional  Limits for tasks assessed                                     General Comments  orthostatic vitals taken: supine 109/74 HR 78; sitting 95/70 HR 94; standing 93/64 HR 101; pt states he was light headed and dizzy, but  was not going to pass out    Exercises     Shoulder Instructions      Home Living Family/patient expects to be discharged to:: Private residence Living Arrangements: Alone Available Help at Discharge: Family;Available PRN/intermittently Type of Home: House Home Access: Level entry     Home Layout: One level     Bathroom Shower/Tub: Occupational psychologist: Standard     Home Equipment: Environmental consultant - 2 wheels;Cane - single point          Prior Functioning/Environment Level of Independence: Independent with assistive device(s)        Comments: uses RW, sponge bathes, mostly uses microwave, sons pick up groceries and take him to the dr        OT Problem List: Impaired balance (sitting and/or standing);Decreased knowledge of use of DME or AE      OT Treatment/Interventions: Self-care/ADL training    OT Goals(Current goals can be found in the care plan section) Acute Rehab OT Goals Patient Stated Goal: To go home OT Goal Formulation: With patient Time For Goal Achievement: 12/13/20 Potential to Achieve Goals: Good ADL Goals Pt Will Perform Grooming: with supervision;standing Pt Will Perform Lower Body Dressing: with supervision;sit to/from stand Pt Will Transfer to Toilet: with supervision;ambulating;regular height toilet Pt Will Perform Toileting - Clothing Manipulation and hygiene: with supervision;sit to/from stand Additional ADL Goal #1: Pt will gather items necessary for ADL around his room with RW and supervision.  OT Frequency: Min 2X/week   Barriers to D/C:            Co-evaluation              AM-PAC OT "6 Clicks" Daily Activity     Outcome Measure Help from another person eating meals?: None Help from another person taking care of personal grooming?: A Little Help from another person toileting, which includes using toliet, bedpan, or urinal?: A Little Help from another person bathing (including washing, rinsing, drying)?: A Little Help  from another person to put on and taking off regular upper body clothing?: A Little Help from another person to put on and taking off regular lower body clothing?: A Little 6 Click Score: 19   End of Session Equipment Utilized During Treatment: Rolling walker;Gait belt  Activity Tolerance: Patient tolerated treatment well Patient left: in bed;with call bell/phone within reach;with bed alarm set;with family/visitor present  OT Visit Diagnosis: Unsteadiness on feet (R26.81);Other abnormalities of gait and mobility (R26.89);Dizziness and giddiness (R42);Muscle weakness (generalized) (M62.81);Low vision, both eyes (H54.2)                Time: 7169-6789 OT Time Calculation (min): 32 min Charges:  OT General Charges $OT Visit: 1 Visit OT Evaluation $OT Eval Moderate Complexity: 1 Mod OT Treatments $Self Care/Home Management : 8-22 mins  Nestor Lewandowsky, OTR/L Acute Rehabilitation Services Pager: [phone] Office: [phone]   Malka So 11/29/2020, 3:33 PM

## 2020-11-29 NOTE — Consult Note (Signed)
WOC Nurse Consult Note: Patient receiving care in [facility] 5M01 Reason for Consult: Skin tears right arm Wound type: Skin tear right hand, upper right arm and right elbow.  Pressure Injury POA: NA Wound bed: Skin flaps still present on most.  Drainage (amount, consistency, odor)  Periwound: Fragile skin, scattered bruising along with skin tears. Dressing procedure/placement/frequency: Gently remove dressings to prevent more tears. Clean the arm with soap and water, rinse and pat dry. Apply Xeroform gauze from the anterior aspect of the hand at the knuckles up to the elbow. Wrap with Kerlix and Ace wrap. Change daily.   Monitor the wound area(s) for worsening of condition such as: Signs/symptoms of infection, increase in size, development of or worsening of odor, development of pain, or increased pain at the affected locations.   Notify the medical team if any of these develop.  Thank you for the consult. [place] nurse will not follow at this time.   Please re-consult the [place] team if needed.  Cathlean Marseilles Tamala Julian, MSN, RN, [place], Lysle Pearl, [facility] Wound Treatment Associate Pager [phone]

## 2020-11-29 NOTE — Progress Notes (Signed)
Echocardiogram 2D Echocardiogram has been performed.  Oneal Deputy Zeta Bucy 11/29/2020, 3:01 PM

## 2020-11-29 NOTE — Plan of Care (Signed)
  Problem: Education: Goal: Knowledge of General Education information will improve Description: Including pain rating scale, medication(s)/side effects and non-pharmacologic comfort measures Outcome: Completed/Met

## 2020-11-29 NOTE — Progress Notes (Addendum)
PROGRESS NOTE    Oracio Galen  ZOX:[id number] DOB: [birth year] DOA: 11/28/2020 PCP: Pleas Koch, NP     Brief Narrative:  CASTEN FLOREN Sr. is a 87 y.o. WM PMHx , TIA, HFpEF 50 to 55%, aortic stenosis, MI 1988 and 1990, multiple gastric ulcers, severe esophagitis, hepatocellular carcinoma, liver cirrhosis with ascites with no prior history of alcohol abuse, hepatitis or autoimmune disorder, recent admission on April 2022 due to volume overload and orthostatic hypotension, discharged from the hospital after a week of hospitalization went to rehab for 3 weeks then returned home where he lives alone, his son lives 15 minutes away.    Presents today due to recurrent falls and presyncopal episodes.  Associated with generalized weakness, the feeling that he is about to pass out when he stands and falls.  Reported loose stools intermittently.  No cardiopulmonary symptoms.  Denies fever chills or headaches.  Work-up in the ED revealed orthostatic hypotension.  Concern for dehydration with elevated creatinine, possible UTI.  EDP requested admission.  TRH, hospitalist team, was asked to admit.   ED Course:  Temperature 97.9.  BP 97/82, pulse 89, respiration 18, O2 saturation 92% on room air.  Lab studies remarkable for serum bicarb 26, glucose 131, BUN 23, creatinine 1.42, anion gap 11, GFR 47.  BNP 363, troponin 22 from 19.  WBC 11.3, hemoglobin 17.6, MCV 98, platelet 226.  Urine analysis WBC UA 11-20, leukocyte UA small.   Subjective: Afebrile overnight, A/O x4, states became lightheaded and fell down and injured his right arm.  However today during orthostatic vitals did not feel lightheaded.  Verifies that son lives 15 minutes away and checks on him 3 times a week, and ensures he has groceries.   Assessment & Plan: Covid vaccination;   Active Problems:   Postural dizziness with presyncope  Postural dizziness with presyncope -Presented with generalized weakness and recurrent  falls -Positive orthostatic vital signs -Continue fall precautions -PT OT to assess    Orthostatic hypotension, suspect autonomic dysfunction -Maintain MAP greater than 65 -Orthostatic vitals every shift -Continue fall precautions -6/16 patient is not orthostatic however BPs are on the low side. -6/16 increase Midodrine 10 mg TID   Presumptive UTI, POA UA equivocal Follow urine culture Rocephin empirically, DC antibiotics if urine culture is negative.   Acute metabolic encephalopathy Unclear of baseline mentation -A/O x4 at time of this exam. -Ammonia level slightly elevated =56 -Lactulose 10 g daily   Hepatocellular carcinoma/cirrhosis with ascites -No prior history of alcohol use or hepatitis disease or autoimmune disorder. -Follows with GI outpatient. -Poor prognosis -Palliative care team consult to assist with establishing goals of care.   History of esophagitis Resume home PPI twice daily.   Chronic lower extremity edema Trace edema in lower extremities bilaterally on exam. He is on p.o. Lasix and p.o. spironolactone at home Elevated BNP however no clear evidence of volume overload on exam. Last 2D echo was on March 2022 revealing LVEF 50 to 55% Start strict I's and O's and daily weight Hold off diuretics for now due to orthostatic hypotension.   Chronic diastolic CHF -Euvolemic on exam - Strict in and out - Daily weight - Every shift orthostatic vitals -Echocardiogram pending   Strict I's and O's and daily weight Diuretics on hold due to orthostatic hypotension. Closely monitor volume status while on gentle IV fluid hydration normal saline at 50 cc/h x 1 day.  DVT prophylaxis:  Code Status:  Family Communication:  Status is: Inpatient    Dispo: The patient is from:               Anticipated d/c is to:               Anticipated d/c date is:               Patient currently       Consultants:    Procedures/Significant  Events:  6/15 CXR:. Probable chronic interstitial disease at the bases with emphysema. There is a small left effusion 2. Convex right mediastinal opacity probably due to aortic tortuosity and augmented by rotated image. CT chest follow-up if deemed clinically appropriate 3. Possible fracture deformity of the distal right clavicle  I have personally reviewed and interpreted all radiology studies and my findings are as above.  VENTILATOR SETTINGS:    Cultures 6/15 SARS coronavirus negative 6/15 influenza A/B negative  Antimicrobials: Anti-infectives (From admission, onward)    Start     Dose/Rate Ordered Stop   11/28/20 2230  cefTRIAXone (ROCEPHIN) 1 g in sodium chloride 0.9 % 100 mL IVPB        1 g 200 mL/hr over 30 Minutes 11/28/20 2224     11/28/20 2145  cephALEXin (KEFLEX) capsule 500 mg        500 mg 11/28/20 2131 11/28/20 2142           Devices    LINES / TUBES:      Continuous Infusions:  sodium chloride 50 mL/hr at 11/29/20 0932   cefTRIAXone (ROCEPHIN)  IV 1 g (11/29/20 0007)     Objective: Vitals:   11/28/20 2244 11/28/20 2319 11/28/20 2325 11/29/20 0258  BP:   125/87 110/74  Pulse:   91 91  Resp:   16 18  Temp: 98.6 F (37 C)  98.1 F (36.7 C) 98.2 F (36.8 C)  TempSrc: Oral  Oral Oral  SpO2:   96% 92%  Weight:  63.7 kg    Height:  5' (1.524 m)      Intake/Output Summary (Last 24 hours) at 11/29/2020 0849 Last data filed at 11/29/2020 0600 Gross per 24 hour  Intake 1670.34 ml  Output 200 ml  Net 1470.34 ml   Filed Weights   11/28/20 2319  Weight: 63.7 kg    Examination:  General: A/O x4, No acute respiratory distress Eyes: negative scleral hemorrhage, negative anisocoria, negative icterus ENT: Negative Runny nose, negative gingival bleeding, Neck:  Negative scars, masses, torticollis, lymphadenopathy, JVD Lungs: Clear to auscultation bilaterally without wheezes or crackles Cardiovascular: Regular rate and rhythm without murmur  gallop or rub normal S1 and S2 Abdomen: negative abdominal pain, nondistended, positive soft, bowel sounds, no rebound, no ascites, no appreciable mass Extremities: right upper extremity with 2 large lacerations, and large bruise bandaged with Ace bandage. Skin: Negative rashes, lesions, ulcers Psychiatric:  Negative depression, negative anxiety, negative fatigue, negative mania  Central nervous system:  Cranial nerves II through XII intact, tongue/uvula midline, all extremities muscle strength 5/5, sensation intact throughout, negative dysarthria, negative expressive aphasia, negative receptive aphasia.  .     Data Reviewed: Care during the described time interval was provided by me .  I have reviewed this patient's available data, including medical history, events of note, physical examination, and all test results as part of my evaluation.  CBC: Recent Labs  Lab 11/28/20 1632 11/29/20 0344  WBC 11.3* 7.6  NEUTROABS 6.6  --  HGB 17.6* 15.9  HCT 52.6* 46.9  MCV 98.3 97.5  PLT 226 789   Basic Metabolic Panel: Recent Labs  Lab 11/28/20 1632 11/29/20 0344  NA 139 140  K 4.4 4.1  CL 102 109  CO2 26 23  GLUCOSE 131* 103*  BUN 23 20  CREATININE 1.42* 1.18  CALCIUM 8.9 8.1*  MG  --  1.9  PHOS  --  3.1   GFR: Estimated Creatinine Clearance: 32 mL/min (by C-G formula based on SCr of 1.18 mg/dL). Liver Function Tests: Recent Labs  Lab 11/28/20 1632  AST 58*  ALT 44  ALKPHOS 324*  BILITOT 0.6  PROT 7.4  ALBUMIN 3.0*   No results for input(s): LIPASE, AMYLASE in the last 168 hours. Recent Labs  Lab 11/29/20 0344  AMMONIA 56*   Coagulation Profile: No results for input(s): INR, PROTIME in the last 168 hours. Cardiac Enzymes: No results for input(s): CKTOTAL, CKMB, CKMBINDEX, TROPONINI in the last 168 hours. BNP (last 3 results) Recent Labs    07/23/20 1023 08/02/20 1202  PROBNP 427.0* 354.0*   HbA1C: No results for input(s): HGBA1C in the last 72  hours. CBG: No results for input(s): GLUCAP in the last 168 hours. Lipid Profile: No results for input(s): CHOL, HDL, LDLCALC, TRIG, CHOLHDL, LDLDIRECT in the last 72 hours. Thyroid Function Tests: No results for input(s): TSH, T4TOTAL, FREET4, T3FREE, THYROIDAB in the last 72 hours. Anemia Panel: No results for input(s): VITAMINB12, FOLATE, FERRITIN, TIBC, IRON, RETICCTPCT in the last 72 hours. Sepsis Labs: No results for input(s): PROCALCITON, LATICACIDVEN in the last 168 hours.  Recent Results (from the past 240 hour(s))  Resp Panel by RT-PCR (Flu A&B, Covid) Nasopharyngeal Swab     Status: None   Collection Time: 11/28/20  7:19 PM   Specimen: Nasopharyngeal Swab; Nasopharyngeal(NP) swabs in vial transport medium  Result Value Ref Range Status   SARS Coronavirus 2 by RT PCR NEGATIVE NEGATIVE Final    Comment: (NOTE) SARS-CoV-2 target nucleic acids are NOT DETECTED.  The SARS-CoV-2 RNA is generally detectable in upper respiratory specimens during the acute phase of infection. The lowest concentration of SARS-CoV-2 viral copies this assay can detect is 138 copies/mL. A negative result does not preclude SARS-Cov-2 infection and should not be used as the sole basis for treatment or other patient management decisions. A negative result may occur with  improper specimen collection/handling, submission of specimen other than nasopharyngeal swab, presence of viral mutation(s) within the areas targeted by this assay, and inadequate number of viral copies(<138 copies/mL). A negative result must be combined with clinical observations, patient history, and epidemiological information. The expected result is Negative.  Fact Sheet for Patients:  EntrepreneurPulse.com.au  Fact Sheet for Healthcare Providers:  IncredibleEmployment.be  This test is no t yet approved or cleared by the [place] FDA and  has been authorized for detection and/or  diagnosis of SARS-CoV-2 by FDA under an Emergency Use Authorization (EUA). This EUA will remain  in effect (meaning this test can be used) for the duration of the COVID-19 declaration under Section 564(b)(1) of the Act, 21 U.S.C.section 360bbb-3(b)(1), unless the authorization is terminated  or revoked sooner.       Influenza A by PCR NEGATIVE NEGATIVE Final   Influenza B by PCR NEGATIVE NEGATIVE Final    Comment: (NOTE) The Xpert Xpress SARS-CoV-2/FLU/RSV plus assay is intended as an aid in the diagnosis of influenza from Nasopharyngeal swab specimens and should not be used as a sole basis for  treatment. Nasal washings and aspirates are unacceptable for Xpert Xpress SARS-CoV-2/FLU/RSV testing.  Fact Sheet for Patients: EntrepreneurPulse.com.au  Fact Sheet for Healthcare Providers: IncredibleEmployment.be  This test is not yet approved or cleared by the [place] FDA and has been authorized for detection and/or diagnosis of SARS-CoV-2 by FDA under an Emergency Use Authorization (EUA). This EUA will remain in effect (meaning this test can be used) for the duration of the COVID-19 declaration under Section 564(b)(1) of the Act, 21 U.S.C. section 360bbb-3(b)(1), unless the authorization is terminated or revoked.  Performed at [facility], [place] [address], [place], [place] [address]          Radiology Studies: DG Chest 2 View  Result Date: 11/28/2020 CLINICAL DATA:  Syncope EXAM: CHEST - 2 VIEW COMPARISON:  09/13/2020, 06/06/2020 FINDINGS: Mild reticular opacity at the bases likely due to chronic disease. Emphysema. Trace left-sided pleural effusion. Convex opacity at the right mid to lower mediastinal silhouette potentially due to aortic tortuosity augmented by rotated image. No pleural effusion or pneumothorax. There is possible right clavicle fracture deformity. IMPRESSION: 1. Probable chronic interstitial disease at the bases  with emphysema. There is a small left effusion 2. Convex right mediastinal opacity probably due to aortic tortuosity and augmented by rotated image. CT chest follow-up if deemed clinically appropriate 3. Possible fracture deformity of the distal right clavicle Electronically Signed   By: Donavan Foil M.D.   On: 11/28/2020 17:11   DG Elbow Complete Right  Result Date: 11/28/2020 CLINICAL DATA:  Syncope EXAM: RIGHT ELBOW - COMPLETE 3+ VIEW COMPARISON:  None. FINDINGS: No definitive fracture or malalignment. Posterior elbow soft tissue swelling. Possible small elbow effusion IMPRESSION: 1. No definite acute osseous abnormality. There is possible small elbow effusion, consider short interval radiographic follow-up to exclude occult fracture Electronically Signed   By: Donavan Foil M.D.   On: 11/28/2020 17:12   DG Forearm Right  Result Date: 11/28/2020 CLINICAL DATA:  Syncopal episodes multiple fall EXAM: RIGHT FOREARM - 2 VIEW COMPARISON:  None. FINDINGS: There is no evidence of fracture or other focal bone lesions. Soft tissues are unremarkable. IMPRESSION: Negative. Electronically Signed   By: Donavan Foil M.D.   On: 11/28/2020 17:13   CT Head Wo Contrast  Result Date: 11/28/2020 CLINICAL DATA:  Head trauma.  Multiple syncopal episodes. EXAM: CT HEAD WITHOUT CONTRAST TECHNIQUE: Contiguous axial images were obtained from the base of the skull through the vertex without intravenous contrast. COMPARISON:  None. FINDINGS: Brain: No evidence of acute infarction, hemorrhage, hydrocephalus, extra-axial collection or mass lesion/mass effect. Marked parenchymal volume loss and deep white matter microangiopathy. Stable encephalomalacia of the right parietal lobe, likely from prior ischemic infarction. Vascular: No hyperdense vessel or unexpected calcification. Skull: Calcific atherosclerotic disease of the intra cavernous carotid arteries. Sinuses/Orbits: No acute finding. Other: None. IMPRESSION: 1. No acute  intracranial abnormality. 2. Atrophy, chronic microvascular disease. 3. Stable encephalomalacia of the right parietal lobe, likely from prior ischemic infarction. Electronically Signed   By: Fidela Salisbury M.D.   On: 11/28/2020 17:18   CT Cervical Spine Wo Contrast  Result Date: 11/28/2020 CLINICAL DATA:  Post fall from multiple syncopal episodes. EXAM: CT CERVICAL SPINE WITHOUT CONTRAST TECHNIQUE: Multidetector CT imaging of the cervical spine was performed without intravenous contrast. Multiplanar CT image reconstructions were also generated. COMPARISON:  None. FINDINGS: Alignment: Anterolisthesis of C2 on C3 posterior listhesis of C5 on C6. Skull base and vertebrae: No acute fracture. No primary bone lesion or focal  pathologic process. Soft tissues and spinal canal: No prevertebral fluid or swelling. No visible canal hematoma. Disc levels: Multilevel osteoarthritic changes with disc space narrowing, remodeling of vertebral bodies, osteophytosis and posterior facet arthropathy. Upper chest: Negative. Other: None. IMPRESSION: 1. No evidence of acute traumatic injury to the cervical spine. 2. Anterolisthesis of C2 on C3 and posterior listhesis of C5 on C6, likely degenerative. 3. Multilevel osteoarthritic changes of the cervical spine. Electronically Signed   By: Fidela Salisbury M.D.   On: 11/28/2020 17:22   DG Hand Complete Right  Result Date: 11/28/2020 CLINICAL DATA:  Syncopal episodes with multiple fall EXAM: RIGHT HAND - COMPLETE 3+ VIEW COMPARISON:  None. FINDINGS: No fracture or malalignment. Scattered degenerative change within the digits of the right hand. Moderate arthritis at the STT interval. IMPRESSION: No acute osseous abnormality Electronically Signed   By: Donavan Foil M.D.   On: 11/28/2020 17:14        Scheduled Meds:  heparin injection (subcutaneous)  5,000 Units Subcutaneous Q8H   lactulose  10 g Oral Daily   midodrine  5 mg Oral BID WC   multivitamin with minerals  1  tablet Oral BID   pantoprazole  40 mg Oral BID   Continuous Infusions:  sodium chloride 50 mL/hr at 11/29/20 3664   cefTRIAXone (ROCEPHIN)  IV 1 g (11/29/20 0007)     LOS: 0 days    Time spent:40 min    Zenon Leaf, Geraldo Docker, MD [facility]   If 7PM-7AM, please contact night-coverage 11/29/2020, 8:49 AM

## 2020-11-29 NOTE — Progress Notes (Signed)
HR raises to 140-160's when moving around the bed.

## 2020-11-29 NOTE — Evaluation (Signed)
Physical Therapy Evaluation Patient Details Name: David GORKA Sr. MRN: [id number] DOB: [birth year] Today's Date: 11/29/2020   History of Present Illness  David Fabela. is a 87 y.o. male who presents due to recurrent falls and presyncopal episodes on 6/15.  Associated with generalized weaknesswith medical history significant for TIA, HFpEF 50 to 55%, multiple gastric ulcers, severe esophagitis, hepatocellular carcinoma, liver cirrhosis with ascites with no prior history of alcohol abuse, hepatitis or autoimmune disorder, recent admission on April 2022 due to volume overload and orthostatic hypotension, discharged from the hospital after a week of hospitalization went to rehab for 3 weeks then returned home where he lives alone, his son lives 15 minutes away.  Clinical Impression  Pt fully participated in evaluation. Pt on observation for above mentioned deficits. Pt states following rehab was at home alone using AD for mobility. Pt became lightheaded during evaluation but states he wasn't going to pass out. Pt performed tasks at min A level during evaluation with use of RW. Pt will benefit from skilled PT to address deficits in strength, balance, gait and endurance to maximize independence with functional mobility prior to discharge.     Follow Up Recommendations SNF vs. Home health PT;Supervision - Intermittent    Equipment Recommendations  Other (comment) (TBD; pending cause of syncopal episodes could need w/c)    Recommendations for Other Services       Precautions / Restrictions Precautions Precautions: Fall Precaution Comments: monitor BP, pt becomes dizzy with change in positions; pt legally blind Restrictions Weight Bearing Restrictions: No      Mobility  Bed Mobility Overal bed mobility: Needs Assistance Bed Mobility: Supine to Sit     Supine to sit: Min assist          Transfers Overall transfer level: Needs assistance Equipment used: Rolling walker (2  wheeled) Transfers: Sit to/from Stand Sit to Stand: Min assist            Ambulation/Gait Ambulation/Gait assistance: Min guard Gait Distance (Feet): 15 Feet Assistive device: Rolling walker (2 wheeled) Gait Pattern/deviations: Decreased step length - right;Decreased step length - left     General Gait Details: forward flexed posture  Stairs            Wheelchair Mobility    Modified Rankin (Stroke Patients Only)       Balance Overall balance assessment: Needs assistance Sitting-balance support: Feet supported Sitting balance-Leahy Scale: Fair     Standing balance support: Bilateral upper extremity supported Standing balance-Leahy Scale: Poor                               Pertinent Vitals/Pain      Home Living Family/patient expects to be discharged to:: Private residence Living Arrangements: Alone Available Help at Discharge: Family;Available PRN/intermittently Type of Home: House Home Access: Level entry     Home Layout: One level Home Equipment: Walker - 2 wheels;Cane - single point      Prior Function Level of Independence: Independent with assistive device(s)               Hand Dominance   Dominant Hand: Right    Extremity/Trunk Assessment   Upper Extremity Assessment Upper Extremity Assessment: Defer to OT evaluation    Lower Extremity Assessment Lower Extremity Assessment: Generalized weakness    Cervical / Trunk Assessment Cervical / Trunk Assessment: Kyphotic  Communication   Communication: No difficulties;HOH  Cognition Arousal/Alertness: Awake/alert Behavior  During Therapy: WFL for tasks assessed/performed Overall Cognitive Status: Within Functional Limits for tasks assessed                                        General Comments General comments (skin integrity, edema, etc.): orthostatic vitals taken: supine 109/74 HR 78; sitting 95/70 HR 94; standing 93/64 HR 101; pt states he was  light headed and dizzy, but was not going to pass out    Exercises     Assessment/Plan    PT Assessment Patient needs continued PT services  PT Problem List Decreased strength;Decreased mobility;Decreased coordination;Decreased activity tolerance;Decreased balance       PT Treatment Interventions DME instruction;Therapeutic exercise    PT Goals (Current goals can be found in the Care Plan section)  Acute Rehab PT Goals Patient Stated Goal: To go home PT Goal Formulation: With patient Time For Goal Achievement: 12/13/20 Potential to Achieve Goals: Good    Frequency Min 3X/week   Barriers to discharge        Co-evaluation               AM-PAC PT "6 Clicks" Mobility  Outcome Measure Help needed turning from your back to your side while in a flat bed without using bedrails?: None Help needed moving from lying on your back to sitting on the side of a flat bed without using bedrails?: A Little Help needed moving to and from a bed to a chair (including a wheelchair)?: A Little Help needed standing up from a chair using your arms (e.g., wheelchair or bedside chair)?: A Little Help needed to walk in hospital room?: A Little Help needed climbing 3-5 steps with a railing? : A Lot 6 Click Score: 18    End of Session Equipment Utilized During Treatment: Gait belt Activity Tolerance: Patient tolerated treatment well;Other (comment) (limited by dizziness) Patient left: in chair;with chair alarm set;with call bell/phone within reach Nurse Communication: Mobility status PT Visit Diagnosis: Unsteadiness on feet (R26.81);Other abnormalities of gait and mobility (R26.89);Muscle weakness (generalized) (M62.81)    Time: 4383-8184 PT Time Calculation (min) (ACUTE ONLY): 18 min   Charges:   PT Evaluation $PT Eval Low Complexity: [place], DPT Acute Rehabilitation Services [phone]   [place] 11/29/2020, 1:35 PM

## 2020-11-29 NOTE — Plan of Care (Signed)
  Problem: Clinical Measurements: Goal: Diagnostic test results will improve Outcome: Completed/Met Goal: Respiratory complications will improve Outcome: Completed/Met Goal: Cardiovascular complication will be avoided Outcome: Completed/Met   Problem: Nutrition: Goal: Adequate nutrition will be maintained Outcome: Completed/Met   Problem: Coping: Goal: Level of anxiety will decrease Outcome: Completed/Met

## 2020-11-30 DIAGNOSIS — N390 Urinary tract infection, site not specified: Secondary | ICD-10-CM | POA: Diagnosis present

## 2020-11-30 DIAGNOSIS — Z7189 Other specified counseling: Secondary | ICD-10-CM | POA: Diagnosis not present

## 2020-11-30 DIAGNOSIS — C220 Liver cell carcinoma: Secondary | ICD-10-CM

## 2020-11-30 DIAGNOSIS — K7031 Alcoholic cirrhosis of liver with ascites: Secondary | ICD-10-CM

## 2020-11-30 DIAGNOSIS — G9341 Metabolic encephalopathy: Secondary | ICD-10-CM | POA: Diagnosis present

## 2020-11-30 DIAGNOSIS — I5032 Chronic diastolic (congestive) heart failure: Secondary | ICD-10-CM

## 2020-11-30 DIAGNOSIS — I951 Orthostatic hypotension: Principal | ICD-10-CM | POA: Diagnosis present

## 2020-11-30 DIAGNOSIS — N3000 Acute cystitis without hematuria: Secondary | ICD-10-CM | POA: Diagnosis not present

## 2020-11-30 DIAGNOSIS — T148XXA Other injury of unspecified body region, initial encounter: Secondary | ICD-10-CM | POA: Diagnosis not present

## 2020-11-30 DIAGNOSIS — R42 Dizziness and giddiness: Secondary | ICD-10-CM

## 2020-11-30 DIAGNOSIS — R55 Syncope and collapse: Secondary | ICD-10-CM | POA: Diagnosis not present

## 2020-11-30 LAB — COMPREHENSIVE METABOLIC PANEL
ALT: 31 U/L (ref 0–44)
AST: 37 U/L (ref 15–41)
Albumin: 2.2 g/dL — ABNORMAL LOW (ref 3.5–5.0)
Alkaline Phosphatase: 239 U/L — ABNORMAL HIGH (ref 38–126)
Anion gap: 6 (ref 5–15)
BUN: 17 mg/dL (ref 8–23)
CO2: 22 mmol/L (ref 22–32)
Calcium: 8.2 mg/dL — ABNORMAL LOW (ref 8.9–10.3)
Chloride: 111 mmol/L (ref 98–111)
Creatinine, Ser: 1.12 mg/dL (ref 0.61–1.24)
GFR, Estimated: >60 mL/min (ref >60)
Glucose, Bld: 96 mg/dL (ref 70–99)
Potassium: 4.1 mmol/L (ref 3.5–5.1)
Sodium: 139 mmol/L (ref 135–145)
Total Bilirubin: 1.1 mg/dL (ref 0.3–1.2)
Total Protein: 5.4 g/dL — ABNORMAL LOW (ref 6.5–8.1)

## 2020-11-30 LAB — URINE CULTURE: Culture: NO GROWTH

## 2020-11-30 LAB — CBC
HCT: 43.2 % (ref 39.0–52.0)
Hemoglobin: 14.3 g/dL (ref 13.0–17.0)
MCH: 32.5 pg (ref 26.0–34.0)
MCHC: 33.1 g/dL (ref 30.0–36.0)
MCV: 98.2 fL (ref 80.0–100.0)
Platelets: 170 10*3/uL (ref 150–400)
RBC: 4.4 MIL/uL (ref 4.22–5.81)
RDW: 13.1 % (ref 11.5–15.5)
WBC: 7.9 10*3/uL (ref 4.0–10.5)
nRBC: 0 % (ref 0.0–0.2)

## 2020-11-30 LAB — MAGNESIUM: Magnesium: 1.8 mg/dL (ref 1.7–2.4)

## 2020-11-30 LAB — PHOSPHORUS: Phosphorus: 2.8 mg/dL (ref 2.5–4.6)

## 2020-11-30 MED ORDER — ALBUMIN HUMAN 25 % IV SOLN
12.5000 g | Freq: Once | INTRAVENOUS | Status: AC
  Administered 2020-11-30: 12.5 g via INTRAVENOUS
  Filled 2020-11-30: qty 50

## 2020-11-30 MED ORDER — ALBUMIN HUMAN 25 % IV SOLN
50.0000 g | Freq: Once | INTRAVENOUS | Status: AC
  Administered 2020-11-30: 50 g via INTRAVENOUS
  Filled 2020-11-30: qty 200

## 2020-11-30 MED ORDER — SODIUM CHLORIDE 0.9 % IV SOLN: INTRAVENOUS | Status: DC

## 2020-11-30 NOTE — Progress Notes (Signed)
Occupational Therapy Treatment Patient Details Name: David BOSSARD Sr. MRN: [id number] DOB: July 29, 1926 Today's Date: 11/30/2020    History of present illness David TRIPODI Sr. is a 87 y.o. male who presents due to recurrent falls and presyncopal episodes on 6/15.  Associated with generalized weaknesswith medical history significant for TIA, HFpEF 50 to 55%, multiple gastric ulcers, severe esophagitis, hepatocellular carcinoma, liver cirrhosis with ascites with no prior history of alcohol abuse, hepatitis or autoimmune disorder, recent admission on April 2022 due to volume overload and orthostatic hypotension, discharged from the hospital after a week of hospitalization went to rehab for 3 weeks then returned home where he lives alone, his son lives 15 minutes away.   OT comments  David Bradshaw making steady progress towards OT goals this session. David Bradshaw received supine in bed agreeable to OT intervention. David Bradshaw David Bradshaw continues to be limited by dizziness with mobility ( see vitals below), baseline visual impairments and impaired balance. David Bradshaw currently requires MINA  for functional mobility and up to MIN A for bed mobility, deferred OOB transfer d/t reprots of dizziness. David Bradshaw would continue to benefit from skilled occupational therapy while admitted and after d/c to address the below listed limitations in order to improve overall functional mobility and facilitate independence with BADL participation. DC plan remains appropriate, will follow acutely per POC.   supine 91/66 (75) HR 98 sitting EOB 79/63 ( 70) HR 111 sitting EOB ~ 4 mins 94/71 ( 78) HR 113 standing 86/65 ( 74) HR 110.     Follow Up Recommendations  Home health OT;Supervision/Assistance - 24 hour;Other (comment) (initially)    Equipment Recommendations  None recommended by OT    Recommendations for Other Services      Precautions / Restrictions Precautions Precautions: Fall Precaution Comments: monitor BP, David Bradshaw becomes dizzy with change in  positions; David Bradshaw legally blind Restrictions Weight Bearing Restrictions: No       Mobility Bed Mobility Overal bed mobility: Needs Assistance Bed Mobility: Supine to Sit;Sit to Supine     Supine to sit: Min assist;HOB elevated Sit to supine: Min guard;HOB elevated   General bed mobility comments: David Bradshaw requried light MIN A to elevate trunk into sitting,David Bradshaw able to return to supine with minguard assist for safety and line mgmt    Transfers Overall transfer level: Needs assistance Equipment used: Rolling walker (2 wheeled) Transfers: Sit to/from Stand Sit to Stand: Min assist         General transfer comment: assist to rise and steady    Balance Overall balance assessment: Needs assistance Sitting-balance support: Feet supported Sitting balance-Leahy Scale: Fair     Standing balance support: Bilateral upper extremity supported Standing balance-Leahy Scale: Poor Standing balance comment: reliant on B UE support                           ADL either performed or assessed with clinical judgement   ADL Overall ADL's : Needs assistance/impaired                                     Functional mobility during ADLs: Minimal assistance;Rolling walker General ADL Comments: David Bradshaw continues to present with baseline visual deficits, impaired balance, and generalized deconditioning     Vision Baseline Vision/History: Legally blind;Macular Degeneration Patient Visual Report: No change from baseline Additional Comments: David Bradshaw reports she can see blurry images but lacks visual acuity, seems  to compensate well with David Bradshaw able to verablize low vision strategies that he currently uses   Perception     Praxis      Cognition Arousal/Alertness: Awake/alert Behavior During Therapy: WFL for tasks assessed/performed Overall Cognitive Status: Within Functional Limits for tasks assessed                                          Exercises     Shoulder  Instructions       General Comments supine 91/66 (75) HR 98, sitting EOB 79/63 ( 70) HR 111, sitting EOB ~ 4 mins 94/71 ( 78) HR 113, standing 86/65 ( 74) HR 110. David Bradshaw reports dizziness during all positional changes but does not feel like hes going to pass out. David Bradshaw reports the son he is going to live with can provided 24/7 assistance    Pertinent Vitals/ Pain       Pain Assessment: No/denies pain  Home Living                                          Prior Functioning/Environment              Frequency  Min 2X/week        Progress Toward Goals  OT Goals(current goals can now be found in the care plan section)  Progress towards OT goals: Progressing toward goals  Acute Rehab OT Goals Patient Stated Goal: to figure out whats making him dizzy OT Goal Formulation: With patient Time For Goal Achievement: 12/13/20 Potential to Achieve Goals: Good  Plan Discharge plan remains appropriate;Frequency remains appropriate    Co-evaluation                 AM-PAC OT "6 Clicks" Daily Activity     Outcome Measure   Help from another person eating meals?: None Help from another person taking care of personal grooming?: A Little Help from another person toileting, which includes using toliet, bedpan, or urinal?: A Little Help from another person bathing (including washing, rinsing, drying)?: A Little Help from another person to put on and taking off regular upper body clothing?: A Little Help from another person to put on and taking off regular lower body clothing?: A Little 6 Click Score: 19    End of Session Equipment Utilized During Treatment: Gait belt;Rolling walker  OT Visit Diagnosis: Unsteadiness on feet (R26.81);Other abnormalities of gait and mobility (R26.89);Dizziness and giddiness (R42);Muscle weakness (generalized) (M62.81);Low vision, both eyes (H54.2)   Activity Tolerance Patient tolerated treatment well   Patient Left in bed;with call  bell/phone within reach;with bed alarm set   Nurse Communication Mobility status        Time: 3710-6269 OT Time Calculation (min): 27 min  Charges: OT General Charges $OT Visit: 1 Visit OT Treatments $Self Care/Home Management : 23-37 mins  Harley Alto., COTA/L Acute Rehabilitation Services [phone]    [name] 11/30/2020, 9:17 AM

## 2020-11-30 NOTE — Progress Notes (Signed)
PROGRESS NOTE    David Bradshaw  BEM:[id number] DOB: 06-03-1927 DOA: 11/28/2020 PCP: Pleas Koch, NP     Brief Narrative:  David HAUGAN Sr. is a 87 y.o. WM PMHx , TIA, HFpEF 50 to 55%, aortic stenosis, MI 1988 and 1990, multiple gastric ulcers, severe esophagitis, hepatocellular carcinoma, liver cirrhosis with ascites with no prior history of alcohol abuse, hepatitis or autoimmune disorder, recent admission on April 2022 due to volume overload and orthostatic hypotension, discharged from the hospital after a week of hospitalization went to rehab for 3 weeks then returned home where he lives alone, his son lives 15 minutes away.    Presents today due to recurrent falls and presyncopal episodes.  Associated with generalized weakness, the feeling that he is about to pass out when he stands and falls.  Reported loose stools intermittently.  No cardiopulmonary symptoms.  Denies fever chills or headaches.  Work-up in the ED revealed orthostatic hypotension.  Concern for dehydration with elevated creatinine, possible UTI.  EDP requested admission.  TRH, hospitalist team, was asked to admit.   ED Course:  Temperature 97.9.  BP 97/82, pulse 89, respiration 18, O2 saturation 92% on room air.  Lab studies remarkable for serum bicarb 26, glucose 131, BUN 23, creatinine 1.42, anion gap 11, GFR 47.  BNP 363, troponin 22 from 19.  WBC 11.3, hemoglobin 17.6, MCV 98, platelet 226.  Urine analysis WBC UA 11-20, leukocyte UA small.   Subjective: 6/17 afebrile overnight, A/O x4    Assessment & Plan: Covid vaccination;   Active Problems:   Cirrhosis of liver with ascites (HCC)   Hepatocellular carcinoma (HCC)   Postural dizziness with presyncope   Orthostatic hypotension   Acute lower UTI   Acute metabolic encephalopathy   Chronic diastolic CHF (congestive heart failure) (HCC)  Postural dizziness with presyncope -Presented with generalized weakness and recurrent falls - Every shift  orthostatic vitals -Continue fall precautions -PT /OT; home health -6/17 palliative care met with patient and son;Patient and [name] would like to continue with their current home health services and PT for now along with [facility] who they are enrolled with. If patient goes home and declines- then they wish to transition to Hospice    Orthostatic hypotension, suspect autonomic dysfunction -Maintain MAP greater than 65 -Orthostatic vitals every shift -Continue fall precautions -6/16 patient is not orthostatic however BPs are on the low side. -6/16 increase Midodrine 10 mg TID -6/17 even after increase in midodrine patient orthostatic: Lying to sitting -6/17 albumin 50 g - 6/17 TED hose knee-high   Presumptive UTI, POA UA equivocal Follow urine culture Rocephin empirically, DC antibiotics if urine culture is negative.   Acute metabolic encephalopathy Unclear of baseline mentation -A/O x4 at time of this exam. -Ammonia level slightly elevated =56 -Lactulose 10 g daily   Hepatocellular carcinoma/cirrhosis with ascites -No prior history of alcohol use or hepatitis disease or autoimmune disorder. -Follows with GI outpatient. -Poor prognosis -Palliative care team consult to assist with establishing goals of care.   History of esophagitis -Resume home PPI twice daily.   Chronic lower extremity edema -Trace edema in lower extremities bilaterally on exam. -He is on p.o. Lasix and p.o. spironolactone at home (held) -Elevated BNP however no clear evidence of volume overload on exam. Last 2D echo was on March 2022 revealing LVEF 50 to 55%   Chronic diastolic CHF -Euvolemic on exam - Strict in and out +3.1 L - Daily weight [employer]  11/28/20 2319  Weight: 63.7 kg  -Echocardiogram: Diastolic function could not be evaluated see below                 DVT prophylaxis:  Code Status:  Family Communication:  Status is: Inpatient    Dispo: The patient is  from: Home              Anticipated d/c is to: Home              Anticipated d/c date is: 6/19              Patient currently unstable      Consultants:    Procedures/Significant Events:  6/15 CXR:. Probable chronic interstitial disease at the bases with emphysema. There is a small left effusion 2. Convex right mediastinal opacity probably due to aortic tortuosity and augmented by rotated image. CT chest follow-up if deemed clinically appropriate 3. Possible fracture deformity of the distal right clavicle 6/16 Echocardiogram:Left Ventricle: LVEF= 60 to 65%.  Left ventricular diastolic function could not be evaluated.   Right Ventricle: The right ventricular size is normal. No increase in  right ventricular wall thickness. Right ventricular systolic function is  normal. Tricuspid regurgitation signal is inadequate for assessing PA  pressure.    Aortic Valve:  Mild to moderate aortic stenosis is present.  I have personally reviewed and interpreted all radiology studies and my findings are as above.  VENTILATOR SETTINGS:    Cultures 6/15 SARS coronavirus negative 6/15 influenza A/B negative 6/15 urine negative   Antimicrobials: Anti-infectives (From admission, onward)    Start     Dose/Rate Ordered Stop   11/28/20 2230  cefTRIAXone (ROCEPHIN) 1 g in sodium chloride 0.9 % 100 mL IVPB        1 g 200 mL/hr over 30 Minutes 11/28/20 2224     11/28/20 2145  cephALEXin (KEFLEX) capsule 500 mg        500 mg 11/28/20 2131 11/28/20 2142           Devices    LINES / TUBES:      Continuous Infusions:  albumin human     Followed by   albumin human     Followed by   [START ON 12/01/2020] albumin human     cefTRIAXone (ROCEPHIN)  IV 1 g (11/29/20 2127)     Objective: Vitals:   11/30/20 0550 11/30/20 0553 11/30/20 0935 11/30/20 2112  BP: (!) 136/94  93/66 115/74  Pulse: (!) 101  86 81  Resp: 18  18 16   Temp: 98.5 F (36.9 C)  98.6 F (37 C) 98.5 F (36.9  C)  TempSrc:    Oral  SpO2: (!) 89% 94% 93% 92%  Weight:      Height:        Intake/Output Summary (Last 24 hours) at 11/30/2020 2131 Last data filed at 11/30/2020 1900 Gross per 24 hour  Intake 1866.66 ml  Output 400 ml  Net 1466.66 ml   Filed Weights   11/28/20 2319  Weight: 63.7 kg    Examination:  General: A/O x4, No acute respiratory distress Eyes: negative scleral hemorrhage, negative anisocoria, negative icterus ENT: Negative Runny nose, negative gingival bleeding, Neck:  Negative scars, masses, torticollis, lymphadenopathy, JVD Lungs: Clear to auscultation bilaterally without wheezes or crackles Cardiovascular: Regular rate and rhythm without murmur gallop or rub normal S1 and S2 Abdomen: negative abdominal pain, nondistended, positive soft, bowel sounds, no rebound, no ascites, no appreciable mass Extremities: right  upper extremity with 2 large lacerations, and large bruise bandaged with Ace bandage. Skin: Negative rashes, lesions, ulcers Psychiatric:  Negative depression, negative anxiety, negative fatigue, negative mania  Central nervous system:  Cranial nerves II through XII intact, tongue/uvula midline, all extremities muscle strength 5/5, sensation intact throughout, negative dysarthria, negative expressive aphasia, negative receptive aphasia.  .     Data Reviewed: Care during the described time interval was provided by me .  I have reviewed this patient's available data, including medical history, events of note, physical examination, and all test results as part of my evaluation.  CBC: Recent Labs  Lab 11/28/20 1632 11/29/20 0344 11/30/20 0234  WBC 11.3* 7.6 7.9  NEUTROABS 6.6  --   --   HGB 17.6* 15.9 14.3  HCT 52.6* 46.9 43.2  MCV 98.3 97.5 98.2  PLT 226 166 250   Basic Metabolic Panel: Recent Labs  Lab 11/28/20 1632 11/29/20 0344 11/30/20 0234  NA 139 140 139  K 4.4 4.1 4.1  CL 102 109 111  CO2 26 23 22   GLUCOSE 131* 103* 96  BUN 23 20  17   CREATININE 1.42* 1.18 1.12  CALCIUM 8.9 8.1* 8.2*  MG  --  1.9 1.8  PHOS  --  3.1 2.8   GFR: Estimated Creatinine Clearance: 33.7 mL/min (by C-G formula based on SCr of 1.12 mg/dL). Liver Function Tests: Recent Labs  Lab 11/28/20 1632 11/30/20 0234  AST 58* 37  ALT 44 31  ALKPHOS 324* 239*  BILITOT 0.6 1.1  PROT 7.4 5.4*  ALBUMIN 3.0* 2.2*   No results for input(s): LIPASE, AMYLASE in the last 168 hours. Recent Labs  Lab 11/29/20 0344  AMMONIA 56*   Coagulation Profile: No results for input(s): INR, PROTIME in the last 168 hours. Cardiac Enzymes: No results for input(s): CKTOTAL, CKMB, CKMBINDEX, TROPONINI in the last 168 hours. BNP (last 3 results) Recent Labs    07/23/20 1023 08/02/20 1202  PROBNP 427.0* 354.0*   HbA1C: No results for input(s): HGBA1C in the last 72 hours. CBG: No results for input(s): GLUCAP in the last 168 hours. Lipid Profile: No results for input(s): CHOL, HDL, LDLCALC, TRIG, CHOLHDL, LDLDIRECT in the last 72 hours. Thyroid Function Tests: No results for input(s): TSH, T4TOTAL, FREET4, T3FREE, THYROIDAB in the last 72 hours. Anemia Panel: No results for input(s): VITAMINB12, FOLATE, FERRITIN, TIBC, IRON, RETICCTPCT in the last 72 hours. Sepsis Labs: No results for input(s): PROCALCITON, LATICACIDVEN in the last 168 hours.  Recent Results (from the past 240 hour(s))  Resp Panel by RT-PCR (Flu A&B, Covid) Nasopharyngeal Swab     Status: None   Collection Time: 11/28/20  7:19 PM   Specimen: Nasopharyngeal Swab; Nasopharyngeal(NP) swabs in vial transport medium  Result Value Ref Range Status   SARS Coronavirus 2 by RT PCR NEGATIVE NEGATIVE Final    Comment: (NOTE) SARS-CoV-2 target nucleic acids are NOT DETECTED.  The SARS-CoV-2 RNA is generally detectable in upper respiratory specimens during the acute phase of infection. The lowest concentration of SARS-CoV-2 viral copies this assay can detect is 138 copies/mL. A negative result  does not preclude SARS-Cov-2 infection and should not be used as the sole basis for treatment or other patient management decisions. A negative result may occur with  improper specimen collection/handling, submission of specimen other than nasopharyngeal swab, presence of viral mutation(s) within the areas targeted by this assay, and inadequate number of viral copies(<138 copies/mL). A negative result must be combined with clinical observations, patient history,  and epidemiological information. The expected result is Negative.  Fact Sheet for Patients:  EntrepreneurPulse.com.au  Fact Sheet for Healthcare Providers:  IncredibleEmployment.be  This test is no t yet approved or cleared by the [place] FDA and  has been authorized for detection and/or diagnosis of SARS-CoV-2 by FDA under an Emergency Use Authorization (EUA). This EUA will remain  in effect (meaning this test can be used) for the duration of the COVID-19 declaration under Section 564(b)(1) of the Act, 21 U.S.C.section 360bbb-3(b)(1), unless the authorization is terminated  or revoked sooner.       Influenza A by PCR NEGATIVE NEGATIVE Final   Influenza B by PCR NEGATIVE NEGATIVE Final    Comment: (NOTE) The Xpert Xpress SARS-CoV-2/FLU/RSV plus assay is intended as an aid in the diagnosis of influenza from Nasopharyngeal swab specimens and should not be used as a sole basis for treatment. Nasal washings and aspirates are unacceptable for Xpert Xpress SARS-CoV-2/FLU/RSV testing.  Fact Sheet for Patients: EntrepreneurPulse.com.au  Fact Sheet for Healthcare Providers: IncredibleEmployment.be  This test is not yet approved or cleared by the [place] FDA and has been authorized for detection and/or diagnosis of SARS-CoV-2 by FDA under an Emergency Use Authorization (EUA). This EUA will remain in effect (meaning this test can be used) for  the duration of the COVID-19 declaration under Section 564(b)(1) of the Act, 21 U.S.C. section 360bbb-3(b)(1), unless the authorization is terminated or revoked.  Performed at [facility], [place] [address], [place], [place] [address]   Urine Culture     Status: None   Collection Time: 11/28/20  9:32 PM   Specimen: Urine, Random  Result Value Ref Range Status   Specimen Description URINE, RANDOM  Final   Special Requests NONE  Final   Culture   Final    NO GROWTH Performed at [facility], [place] [address], [place], [place] [address]    Report Status 11/30/2020 FINAL  Final         Radiology Studies: ECHOCARDIOGRAM COMPLETE  Result Date: 11/29/2020    ECHOCARDIOGRAM REPORT   Patient Name:   David DEMICCO Sr. Date of Exam: 11/29/2020 Medical Rec #:  [id number]           Height:       60.0 in Accession #:    [id number]          Weight:       140.4 lb Date of Birth:  [birth year]          BSA:          1.606 m Patient Age:    81 years            BP:           92/61 mmHg Patient Gender: M                   HR:           56 bpm. Exam Location:  Inpatient Procedure: 2D Echo, Color Doppler and Cardiac Doppler Indications:    R55 Syncope  History:        Patient has prior history of Echocardiogram examinations, most                 recent 08/27/2020. Risk Factors:Dyslipidemia.  Sonographer:    Raquel Sarna Senior RDCS Referring Phys: [id number] [place]  1. Left ventricular ejection fraction, by estimation, is 60 to 65%. The left ventricle has normal function. Left  ventricular endocardial border not optimally defined to evaluate regional wall motion. Left ventricular diastolic function could not be evaluated.  2. Right ventricular systolic function is normal. The right ventricular size is normal. Tricuspid regurgitation signal is inadequate for assessing PA pressure.  3. The mitral valve is normal in structure. Trivial mitral valve regurgitation. No evidence of mitral stenosis.  4.  The AV is poorly visualized. There appears to be some degree of aortic stenosis possibly mild to moderate by doppler but cannot estimate with accuracy. Consider TEE for repeat limited echo of the AV to try to acquire clearer images. Aortic valve regurgitation is trivial. Aortic valve mean gradient measures 22.0 mmHg. Aortic valve Vmax measures 2.97 m/s.  5. The inferior vena cava is normal in size with greater than 50% respiratory variability, suggesting right atrial pressure of 3 mmHg.  6. There is a prominent epicardial fat pad and trivial pericardial effusion anterior to the right ventricle. FINDINGS  Left Ventricle: Left ventricular ejection fraction, by estimation, is 60 to 65%. The left ventricle has normal function. Left ventricular endocardial border not optimally defined to evaluate regional wall motion. The left ventricular internal cavity size was normal in size. There is no left ventricular hypertrophy. Left ventricular diastolic function could not be evaluated. Right Ventricle: The right ventricular size is normal. No increase in right ventricular wall thickness. Right ventricular systolic function is normal. Tricuspid regurgitation signal is inadequate for assessing PA pressure. Left Atrium: Left atrial size was normal in size. Right Atrium: Right atrial size was normal in size. Pericardium: Trivial pericardial effusion is present. The pericardial effusion is anterior to the right ventricle. Presence of pericardial fat pad. Mitral Valve: The mitral valve is normal in structure. Mild to moderate mitral annular calcification. Trivial mitral valve regurgitation. No evidence of mitral valve stenosis. Tricuspid Valve: The tricuspid valve is normal in structure. Tricuspid valve regurgitation is not demonstrated. No evidence of tricuspid stenosis. Aortic Valve: The AV is poorly visualized. There appears to be some degree of aortic stenosis by doppler but cannot estimate with accuracy. Consider TEE for repeat  limited echo of the AV to try to acquire clearer images. The aortic valve was not well visualized. Aortic valve regurgitation is trivial. Mild to moderate aortic stenosis is present. Aortic valve mean gradient measures 22.0 mmHg. Aortic valve peak gradient measures 35.3 mmHg. Aortic valve area, by VTI measures 0.54 cm. Pulmonic Valve: The pulmonic valve was not well visualized. Pulmonic valve regurgitation is trivial. No evidence of pulmonic stenosis. Aorta: The aortic root is normal in size and structure. Venous: The inferior vena cava is normal in size with greater than 50% respiratory variability, suggesting right atrial pressure of 3 mmHg. IAS/Shunts: The interatrial septum appears to be lipomatous. No atrial level shunt detected by color flow Doppler.  LEFT VENTRICLE PLAX 2D LVIDd:         4.80 cm  Diastology LVIDs:         3.10 cm  LV e' medial:  4.68 cm/s LV PW:         1.00 cm  LV e' lateral: 4.90 cm/s LV IVS:        1.10 cm LVOT diam:     1.90 cm LV SV:         42 LV SV Index:   26 LVOT Area:     2.84 cm  RIGHT VENTRICLE RV S prime:     7.83 cm/s TAPSE (M-mode): 1.8 cm LEFT ATRIUM  Index       RIGHT ATRIUM           Index LA Biplane Vol: 67.0 ml 41.71 ml/m RA Area:     15.10 cm                                     RA Volume:   37.50 ml  23.35 ml/m  AORTIC VALVE AV Area (Vmax):    0.64 cm AV Area (Vmean):   0.61 cm AV Area (VTI):     0.54 cm AV Vmax:           297.00 cm/s AV Vmean:          228.000 cm/s AV VTI:            0.763 m AV Peak Grad:      35.3 mmHg AV Mean Grad:      22.0 mmHg LVOT Vmax:         67.20 cm/s LVOT Vmean:        48.950 cm/s LVOT VTI:          0.147 m LVOT/AV VTI ratio: 0.19  AORTA Ao Root diam: 3.40 cm  SHUNTS Systemic VTI:  0.15 m Systemic Diam: 1.90 cm Fransico Him MD Electronically signed by Fransico Him MD Signature Date/Time: 11/29/2020/3:14:04 PM    Final         Scheduled Meds:  heparin injection (subcutaneous)  5,000 Units Subcutaneous Q8H   lactulose   10 g Oral Daily   midodrine  10 mg Oral TID WC   multivitamin with minerals  1 tablet Oral BID   pantoprazole  40 mg Oral BID   Continuous Infusions:  albumin human     Followed by   albumin human     Followed by   Derrill Memo ON 12/01/2020] albumin human     cefTRIAXone (ROCEPHIN)  IV 1 g (11/29/20 2127)     LOS: 1 day    Time spent:40 min    Noah Pelaez, Geraldo Docker, MD [facility]   If 7PM-7AM, please contact night-coverage 11/30/2020, 9:31 PM

## 2020-11-30 NOTE — Progress Notes (Signed)
Additional encounter:   Palliative-   Met with patient's son at bedside per patient's request.  Reviewed the discussion that I had previously with patient and his wishes regarding medical treatments and code status.  Discussed that patient would qualify for Hospice services based on his preference to go home and not return to hospital, even if that meant it he would be at end of life. Hospice services and philosophy were discussed.  Patient and [name] would like to continue with their current home health services and PT for now along with [facility] who they are enrolled with.  If patient goes home and declines- then they wish to transition to Hospice.   Mariana Kaufman, AGNP-C Palliative Medicine  Additional time: 46 mins  Please call Palliative Medicine team phone with any questions [phone]. For individual providers please see AMION.

## 2020-11-30 NOTE — Consult Note (Addendum)
Consultation Note Date: 11/30/2020   Patient Name: David Bradshaw  DOB: [birth year]  MRN: [id number]  Age / Sex: 87 y.o., male  PCP: Pleas Koch, NP Referring Physician: Allie Bossier, MD  Reason for Consultation: Establishing goals of care  HPI/Patient Profile: 87 y.o. male  with past medical history of hepatocellular carcinoma diagnosed in January of this year- not a candidate for chemoembolization or ablation- progression noted on most recent CT scan and was offered stereotactic radiation therapy but patient and family decided to delay in efforts to continue to gain strength, recent admission in April for volume overload- had some hypotension then, legally blind, systolic CHF, admitted on 11/28/2020 with recurrent syncopal episodes. Workup reveals dehydration, UTI, elevated ammonia. Palliative medicine consulted for [place].    Clinical Assessment and Goals of Care: Met with Mr. Memoli at the bedside. He lives at home alone and has two sons who check on him frequently.  He is able to tell me he is in the hospital due to "passing out".  He had a rehab stay after his last admission and he feels he really benefited and was getting stronger until about two weeks ago. He enjoys doing his PT- he was doing 200 leg exercises a day.  We discussed his comorbidities and goals of care.  He tells me he is 54 and he knows that he is likely going to die soon. He has bough a burial plot next to his wife who died 23 years ago and he is looking forward to reuniting with her and his son who also is deceased. While he is living his main goals are to spend as much time with his family as he can. He enjoys that he and his family get around so well and is grateful for them and the care they provide for him.  His end of life wishes are to die peacefully at home.  He has a MOST form on file and reviewed it- he wishes for DNR,  determine use of antibiotics when infections occurs, would not want life sustained with IV fluids if he wasn't eating or drinking and would not want a feeding tube.  If he were to go home and decline- his wish would be to remain at home and kept comfortable until end of life and not return to the hospital. I introduced the concept of [place] deferred that discussion to his son, [name].   Primary Decision Maker PATIENT    SUMMARY OF RECOMMENDATIONS -DNR -Continue current gentle interventions- would not want invasive procedures -He is currently followed by [facility]- he would likely benefit from transition to Hospice when he is discharge- will reach out to them and his son as well -Attempted to call son- [name]- who Andric plans to go home with- no answer, no opportunity to leave a voicemail    Code Status/Advance Care Planning: DNR  Palliative Prophylaxis:  Delirium Protocol  Additional Recommendations (Limitations, Scope, Preferences): No Artificial Feeding and No Surgical Procedures  Psycho-social/Spiritual:  Desire for further Chaplaincy support:yes Additional Recommendations: Education on Hospice  Prognosis:   < 6 months  Discharge Planning: To Be Determined  Primary Diagnoses: Present on Admission: **None**   I have reviewed the medical record, interviewed the patient and family, and examined the patient. The following aspects are pertinent.  Past Medical History:  Diagnosis Date   Aortic stenosis    mild AS 01/2016 echo   Carotid stenosis    Chicken pox    Choledocholithiasis    Cirrhosis ([place])    Coronary arteriosclerosis due to lipid rich plaque 01/30/2016   DNR (do not resuscitate) 09/13/2020   Edema    Esophagitis    Gastric ulcer    Hepatocellular carcinoma ([place]) 07/05/2020   Myocardial infarct ([place])    1988 and 1990   Stroke [facility])    Social History   Socioeconomic History   Marital status: Widowed    Spouse name:  Not on file   Number of children: 4   Years of education: 81   Highest education level: Not on file  Occupational History   Occupation: Retired - Optician, dispensing  Tobacco Use   Smoking status: Former    Packs/day: 1.00    Years: 35.00    Pack years: 35.00    Types: Cigarettes    Quit date: 07/13/1985    Years since quitting: 35.4   Smokeless tobacco: Never  Vaping Use   Vaping Use: Never used  Substance and Sexual Activity   Alcohol use: No   Drug use: No   Sexual activity: Not on file  Other Topics Concern   Not on file  Social History Narrative   Fun: Watching TV.    Denies abuse and feels safe at home.    Social Determinants of Health   Financial Resource Strain: Not on file  Food Insecurity: Not on file  Transportation Needs: Not on file  Physical Activity: Not on file  Stress: Not on file  Social Connections: Not on file   Scheduled Meds:  heparin injection (subcutaneous)  5,000 Units Subcutaneous Q8H   lactulose  10 g Oral Daily   midodrine  10 mg Oral TID WC   multivitamin with minerals  1 tablet Oral BID   pantoprazole  40 mg Oral BID   Continuous Infusions:  cefTRIAXone (ROCEPHIN)  IV 1 g (11/29/20 2127)   PRN Meds:. Medications Prior to Admission:  Prior to Admission medications   Medication Sig Start Date End Date Taking? Authorizing Provider  furosemide (LASIX) 20 MG tablet Take 1 tablet (20 mg total) by mouth daily. For leg swelling. Patient taking differently: Take 20 mg by mouth every morning. For leg swelling. 10/05/20  Yes Armbruster, Carlota Raspberry, MD  midodrine (PROAMATINE) 5 MG tablet Take 1 tablet (5 mg total) by mouth 2 (two) times daily with a meal. Patient taking differently: Take 5 mg by mouth 2 (two) times daily. 09/18/20  Yes Barb Merino, MD  Multiple Vitamins-Minerals (ICAPS AREDS FORMULA PO) Take 1 capsule by mouth 2 (two) times daily.   Yes [provider]  omeprazole (PRILOSEC) 40 MG capsule Take 1 capsule (40 mg total) by  mouth 2 (two) times daily before a meal. 10/05/20  Yes Armbruster, Carlota Raspberry, MD  spironolactone (ALDACTONE) 50 MG tablet Take 1 tablet (50 mg total) by mouth daily. Patient taking differently: Take 50 mg by mouth every morning. 10/05/20  Yes Armbruster, Carlota Raspberry, MD   Allergies  Allergen Reactions   Atorvastatin Rash and Other (  See Comments)    Patient started on Plavix and Lipitor at the same time and developed a rash   Brilinta [Ticagrelor] Hives   Plavix [Clopidogrel Bisulfate] Rash and Other (See Comments)    Patient started on plavix and lipitor at the same time and developed a rash   Tape Other (See Comments)    SKIN IS VERY THIN- WILL TEAR EASILY!! PLEASE USE PAPER TAPE   Review of Systems  Physical Exam  Vital Signs: BP 93/66   Pulse 86   Temp 98.6 F (37 C)   Resp 18   Ht 5' (1.524 m)   Wt 63.7 kg   SpO2 93%   BMI 27.43 kg/m  Pain Scale: 0-10   Pain Score: 0-No pain   SpO2: SpO2: 93 % O2 Device:SpO2: 93 % O2 Flow Rate: .   IO: Intake/output summary:  Intake/Output Summary (Last 24 hours) at 11/30/2020 1106 Last data filed at 11/30/2020 0600 Gross per 24 hour  Intake 1623.66 ml  Output 750 ml  Net 873.66 ml    LBM: Last BM Date: 11/29/20 Baseline Weight: Weight: 63.7 kg Most recent weight: Weight: 63.7 kg     Palliative Assessment/Data: PPS: 60%     Thank you for this consult. Palliative medicine will continue to follow and assist as needed.   Time In: 1002 Time Out: 1122 Time Total: 80 minutes Greater than 50%  of this time was spent counseling and coordinating care related to the above assessment and plan.  Signed by: Mariana Kaufman, AGNP-C Palliative Medicine    Please contact Palliative Medicine Team phone at [phone] for questions and concerns.  For individual provider: See Shea Evans

## 2020-11-30 NOTE — TOC Initial Note (Signed)
Transition of Care (TOC) - Initial/Assessment Note    Patient Details  Name: David DOREN Sr. MRN: [id number] Date of Birth: Jun 17, 1926  [facility]) CM/SW Contact:    David Friar, RN Phone Number: 11/30/2020, 1:01 PM  Clinical Narrative:                 Patient lives at home alone but his son, David Bradshaw checks on him a few times a week and does his shopping. At d/c he is going to stay with his son, David Bradshaw for at least a few days. CM met with the patient and with his permission spoke to David Bradshaw over the phone. Pt has newly been admitted to [facility] for palliative care and is active with [place] for [facility].  Family provides needed transportation and pt denies issues with home meds.  TOC following and will update [facility] on need for resumption of services.   Expected Discharge Plan: [place] Barriers to Discharge: Continued Medical Work up   Patient Goals and CMS Choice   CMS Medicare.gov Compare Post Acute Care list provided to:: Patient Represenative (must comment) Choice offered to / list presented to : Adult Children  Expected Discharge Plan and Services Expected Discharge Plan: [place]   Discharge Planning Services: CM Consult Post Acute Care Choice: [place] arrangements for the past 2 months: Single Family Home                           HH Arranged: PT, OT HH Agency: [facility]        Prior Living Arrangements/Services Living arrangements for the past 2 months: Single Family Home Lives with:: Self Patient language and need for interpreter reviewed:: Yes Do you feel safe going back to the place where you live?: Yes      Need for Family Participation in Patient Care: Yes (Comment) Care giver support system in place?: Yes (comment) Current home services: DME (David/ walker) Criminal Activity/Legal Involvement Pertinent to Current Situation/Hospitalization: No - Comment as  needed  Activities of Daily Living Home Assistive Devices/Equipment: None ADL Screening (condition at time of admission) Patient's cognitive ability adequate to safely complete daily activities?: Yes Is the patient deaf or have difficulty hearing?: No Does the patient have difficulty seeing, even when wearing glasses/contacts?: No Does the patient have difficulty concentrating, remembering, or making decisions?: No Patient able to express need for assistance with ADLs?: Yes Does the patient have difficulty dressing or bathing?: No Independently performs ADLs?: Yes (appropriate for developmental age) Does the patient have difficulty walking or climbing stairs?: No Weakness of Legs: None Weakness of Arms/Hands: None  Permission Sought/Granted                  Emotional Assessment Appearance:: Appears stated age Attitude/Demeanor/Rapport: Engaged Affect (typically observed): Accepting Orientation: : Oriented to Self, Oriented to Place, Oriented to  Time, Oriented to Situation   Psych Involvement: No (comment)  Admission diagnosis:  Syncope and collapse [R55] Acute cystitis without hematuria [N30.00] Multiple skin tears [T14.8XXA] Postural dizziness with presyncope [R42, R55] Patient Active Problem List   Diagnosis Date Noted   Postural dizziness with presyncope 11/28/2020   Hepatocellular carcinoma ([place]) 11/06/2020   Malnutrition of moderate degree 09/15/2020   Acute congestive heart failure ([place]) 09/13/2020   Acute kidney injury superimposed on CKD ([place]) 09/13/2020   Foot ulcer, right, limited to breakdown of skin ([place])  09/13/2020   Class 1 obesity due to excess calories with body mass index (BMI) of 32.0 to 32.9 in adult 09/13/2020   DNR (do not resuscitate) 09/13/2020   Cerumen impaction 08/02/2020   Exertional dyspnea 08/02/2020   Multiple falls 08/02/2020   Cirrhosis of liver with ascites ([place]) 08/02/2020   Jaundice 06/01/2020   Fatigue 06/01/2020   Bilateral  lower extremity edema 02/11/2016   Chronic kidney disease, stage 3 ([place]) 02/11/2016   Stroke ([place])    Rash and nonspecific skin eruption    HLD (hyperlipidemia)    Carotid stenosis    History of CVA (cerebrovascular accident) 01/31/2016   TIA (transient ischemic attack) 01/30/2016   PCP:  David Koch, NP Pharmacy:   [id number] [id number]: [phone]: [phone]    Social Determinants of Health (SDOH) Interventions    Readmission Risk Interventions No flowsheet data found.

## 2020-11-30 NOTE — Progress Notes (Signed)
This chaplain responded to PMT consult for spiritual care and hugs. The Pt. son-Kevin is at the bedside.   The Pt. greets the chaplain with a smile. The chaplain listens reflectively as the Pt. energetically tells me about how grateful he is to have a wonderful family sharing in his care.  The chaplain understands the Pt. family will be working together with the medical team to determine the best next steps for the Pt.   The Pt. accepts the chaplain's invitation for F/U spiritual care.

## 2020-12-01 DIAGNOSIS — R55 Syncope and collapse: Secondary | ICD-10-CM | POA: Diagnosis not present

## 2020-12-01 DIAGNOSIS — R42 Dizziness and giddiness: Secondary | ICD-10-CM | POA: Diagnosis not present

## 2020-12-01 DIAGNOSIS — T148XXA Other injury of unspecified body region, initial encounter: Secondary | ICD-10-CM | POA: Diagnosis not present

## 2020-12-01 DIAGNOSIS — N3000 Acute cystitis without hematuria: Secondary | ICD-10-CM | POA: Diagnosis not present

## 2020-12-01 LAB — COMPREHENSIVE METABOLIC PANEL
ALT: 25 U/L (ref 0–44)
AST: 32 U/L (ref 15–41)
Albumin: 2.8 g/dL — ABNORMAL LOW (ref 3.5–5.0)
Alkaline Phosphatase: 190 U/L — ABNORMAL HIGH (ref 38–126)
Anion gap: 6 (ref 5–15)
BUN: 15 mg/dL (ref 8–23)
CO2: 24 mmol/L (ref 22–32)
Calcium: 8.3 mg/dL — ABNORMAL LOW (ref 8.9–10.3)
Chloride: 109 mmol/L (ref 98–111)
Creatinine, Ser: 1.19 mg/dL (ref 0.61–1.24)
GFR, Estimated: 58 mL/min — ABNORMAL LOW (ref >60)
Glucose, Bld: 98 mg/dL (ref 70–99)
Potassium: 3.7 mmol/L (ref 3.5–5.1)
Sodium: 139 mmol/L (ref 135–145)
Total Bilirubin: 1 mg/dL (ref 0.3–1.2)
Total Protein: 5.4 g/dL — ABNORMAL LOW (ref 6.5–8.1)

## 2020-12-01 LAB — CBC
HCT: 39.4 % (ref 39.0–52.0)
Hemoglobin: 13.1 g/dL (ref 13.0–17.0)
MCH: 32.6 pg (ref 26.0–34.0)
MCHC: 33.2 g/dL (ref 30.0–36.0)
MCV: 98 fL (ref 80.0–100.0)
Platelets: 160 10*3/uL (ref 150–400)
RBC: 4.02 MIL/uL — ABNORMAL LOW (ref 4.22–5.81)
RDW: 13 % (ref 11.5–15.5)
WBC: 6.7 10*3/uL (ref 4.0–10.5)
nRBC: 0 % (ref 0.0–0.2)

## 2020-12-01 LAB — AMMONIA: Ammonia: 33 umol/L (ref 9–35)

## 2020-12-01 LAB — PHOSPHORUS: Phosphorus: 2.8 mg/dL (ref 2.5–4.6)

## 2020-12-01 LAB — MAGNESIUM: Magnesium: 2.5 mg/dL — ABNORMAL HIGH (ref 1.7–2.4)

## 2020-12-01 MED ORDER — ALBUMIN HUMAN 25 % IV SOLN
50.0000 g | Freq: Once | INTRAVENOUS | Status: AC
  Administered 2020-12-01: 50 g via INTRAVENOUS
  Filled 2020-12-01: qty 200

## 2020-12-01 MED ORDER — MAGNESIUM SULFATE IN D5W 1-5 GM/100ML-% IV SOLN
1.0000 g | Freq: Once | INTRAVENOUS | Status: AC
  Administered 2020-12-01: 1 g via INTRAVENOUS
  Filled 2020-12-01: qty 100

## 2020-12-01 NOTE — TOC Progression Note (Addendum)
Transition of Care (TOC) - Progression Note    Patient Details  Name: David Bradshaw. MRN: [id number] Date of Birth: 1926/09/06  Transition of Care [facility]) CM/SW Contact  Bartholomew Crews, RN Phone Number: [phone] 12/01/2020, 2:28 PM  Clinical Narrative:     Spoke with patient and son, [name], at the bedside. Plan is for patient to discharge home with [name]. Kevin's address is [address], [place], [place] [address]. Discussed HH vs SNF. Advised of Medicare guidelines. Patient previously received SNF level services at [facility] in [place] where he discharged home after 20 days on April 28th. Patient currently active with [facility] for PT. Spoke with [place] at [employer] to increase number of disciplines to [employer], PT, OT, and SW. [facility] requesting updated HH order - MD notified via secure chat of needed order. Patient receiving services through [facility] beginning Friday a week ago, and will receive monthly support to monitor vital signs and weight for fluid retention. [name] stated that a special scale is being installed that will transmit patient's weight daily to MD. TOC following for transition needs.   UPDATE: Spoke with son, [name], about need for 3-N-1. Referral to [facility] for delivery to the room.  Expected Discharge Plan: [place] Barriers to Discharge: Continued Medical Work up  Expected Discharge Plan and Services Expected Discharge Plan: [place]   Discharge Planning Services: CM Consult Post Acute Care Choice: [place] arrangements for the past 2 months: Single Family Home                           HH Arranged: PT, OT HH Agency: [facility]         Social Determinants of Health (SDOH) Interventions    Readmission Risk Interventions No flowsheet data found.

## 2020-12-01 NOTE — Plan of Care (Signed)
  Problem: Elimination: Goal: Will not experience complications related to bowel motility Outcome: Adequate for Discharge   

## 2020-12-01 NOTE — Progress Notes (Signed)
Patient refused to wear a ted hose,MD  aware and the purpose of Ted hose.R.N. changed his left arm dressing and offered again the Ted hose but patient still refusing for the second time.

## 2020-12-01 NOTE — Plan of Care (Signed)
  Problem: Health Behavior/Discharge Planning: Goal: Ability to manage health-related needs will improve Outcome: Progressing   Problem: Clinical Measurements: Goal: Ability to maintain clinical measurements within normal limits will improve Outcome: Progressing Goal: Will remain free from infection Outcome: Progressing   Problem: Activity: Goal: Risk for activity intolerance will decrease Outcome: Progressing   Problem: Elimination: Goal: Will not experience complications related to bowel motility Outcome: Progressing Goal: Will not experience complications related to urinary retention Outcome: Progressing   Problem: Pain Managment: Goal: General experience of comfort will improve Outcome: Completed/Met   Problem: Safety: Goal: Ability to remain free from injury will improve Outcome: Progressing   Problem: Skin Integrity: Goal: Risk for impaired skin integrity will decrease Outcome: Progressing

## 2020-12-01 NOTE — Progress Notes (Signed)
[facility] notified patient had 6 beats Vtach bp 109/73 hr 79 asymptomatic. Will continue to monitor. Arthor Captain LPN

## 2020-12-01 NOTE — Progress Notes (Signed)
PROGRESS NOTE    David Bradshaw  XFG:[id number] DOB: 1927-05-30 DOA: 11/28/2020 PCP: Pleas Koch, NP     Brief Narrative:  David Bradshaw Sr. is a 87 y.o. WM PMHx , TIA, HFpEF 50 to 55%, aortic stenosis, MI 1988 and 1990, multiple gastric ulcers, severe esophagitis, hepatocellular carcinoma, liver cirrhosis with ascites with no prior history of alcohol abuse, hepatitis or autoimmune disorder, recent admission on April 2022 due to volume overload and orthostatic hypotension, discharged from the hospital after a week of hospitalization went to rehab for 3 weeks then returned home where he lives alone, his son lives 15 minutes away.    Presents today due to recurrent falls and presyncopal episodes.  Associated with generalized weakness, the feeling that he is about to pass out when he stands and falls.  Reported loose stools intermittently.  No cardiopulmonary symptoms.  Denies fever chills or headaches.  Work-up in the ED revealed orthostatic hypotension.  Concern for dehydration with elevated creatinine, possible UTI.  EDP requested admission.  TRH, hospitalist team, was asked to admit.   ED Course:  Temperature 97.9.  BP 97/82, pulse 89, respiration 18, O2 saturation 92% on room air.  Lab studies remarkable for serum bicarb 26, glucose 131, BUN 23, creatinine 1.42, anion gap 11, GFR 47.  BNP 363, troponin 22 from 19.  WBC 11.3, hemoglobin 17.6, MCV 98, platelet 226.  Urine analysis WBC UA 11-20, leukocyte UA small.   Subjective: 6/18 afebrile overnight, A/O x4, afebrile overnight.  Patient states does not like TED hose as they hurt his feet.  Son at bedside while I explained the importance of their use.  Son and patient's state they have decided they would like to be discharged home, without any SNF care.   Assessment & Plan: Covid vaccination;   Active Problems:   Cirrhosis of liver with ascites (HCC)   Hepatocellular carcinoma (HCC)   Postural dizziness with presyncope    Orthostatic hypotension   Acute lower UTI   Acute metabolic encephalopathy   Chronic diastolic CHF (congestive heart failure) (HCC)  Postural dizziness with presyncope -Presented with generalized weakness and recurrent falls - Every shift orthostatic vitals -Continue fall precautions -PT /OT; home health -6/17 palliative care met with patient and son;Patient and [name] would like to continue with their current home health services and PT for now along with [facility] who they are enrolled with. If patient goes home and declines- then they wish to transition to Hospice    Orthostatic hypotension, suspect autonomic dysfunction -Maintain MAP greater than 65 -Orthostatic vitals every shift -Continue fall precautions -6/16 patient is not orthostatic however BPs are on the low side. -6/16 increase Midodrine 10 mg TID -6/17 even after increase in midodrine patient orthostatic: Lying to sitting -6/17 Albumin 50 g - 6/17 TED hose knee-high -6/18 Albumin 50 g + normal saline 38m/hr   Presumptive UTI, POA -Urine culture negative -DC antibiotics    Acute metabolic encephalopathy Unclear of baseline mentation -A/O x4 at time of this exam. -Ammonia level slightly elevated =56 -Lactulose 10 g daily   Hepatocellular carcinoma/cirrhosis with ascites -No prior history of alcohol use or hepatitis disease or autoimmune disorder. -Follows with GI outpatient. -Poor prognosis -Palliative care team consult to assist with establishing goals of care.   History of esophagitis -Resume home PPI twice daily.   Chronic lower extremity edema -Trace edema in lower extremities bilaterally on exam. -He is on p.o. Lasix and p.o. spironolactone at home (held) -  Elevated BNP however no clear evidence of volume overload on exam. -Last 2D echo was on March 2022 revealing LVEF 50 to 55%   Chronic diastolic CHF -Euvolemic on exam - Strict in and out +3.5 L - Daily weight Filed Weights   11/28/20  2319 12/01/20 0053  Weight: 63.7 kg 64.9 kg  -Echocardiogram: Diastolic function could not be evaluated see below             Goals of care - 6/18 place face-to-face request for PT, OT, RN, wound care, social work    DVT prophylaxis: Subcu heparin Code Status: DNR Family Communication: 6/18 son at bedside for discussion of plan of care all questions answered Status is: Inpatient    Dispo: The patient is from: Home              Anticipated d/c is to: Home              Anticipated d/c date is: 6/19              Patient currently unstable      Consultants:  Palliative care   Procedures/Significant Events:  6/15 CXR:. Probable chronic interstitial disease at the bases with emphysema. There is a small left effusion 2. Convex right mediastinal opacity probably due to aortic tortuosity and augmented by rotated image. CT chest follow-up if deemed clinically appropriate 3. Possible fracture deformity of the distal right clavicle 6/16 Echocardiogram:Left Ventricle: LVEF= 60 to 65%.  Left ventricular diastolic function could not be evaluated.   Right Ventricle: The right ventricular size is normal. No increase in  right ventricular wall thickness. Right ventricular systolic function is  normal. Tricuspid regurgitation signal is inadequate for assessing PA  pressure.    Aortic Valve:  Mild to moderate aortic stenosis is present.  I have personally reviewed and interpreted all radiology studies and my findings are as above.  VENTILATOR SETTINGS:    Cultures 6/15 SARS coronavirus negative 6/15 influenza A/B negative 6/15 urine negative   Antimicrobials: Anti-infectives (From admission, onward)    Start     Dose/Rate Ordered Stop   11/28/20 2230  cefTRIAXone (ROCEPHIN) 1 g in sodium chloride 0.9 % 100 mL IVPB        1 g 200 mL/hr over 30 Minutes 11/28/20 2224     11/28/20 2145  cephALEXin (KEFLEX) capsule 500 mg        500 mg 11/28/20 2131 11/28/20 2142            Devices    LINES / TUBES:      Continuous Infusions:  sodium chloride 75 mL/hr at 11/30/20 2325   cefTRIAXone (ROCEPHIN)  IV 1 g (12/01/20 0046)     Objective: Vitals:   12/01/20 0053 12/01/20 0145 12/01/20 0505 12/01/20 1013  BP:  109/73 107/72 111/64  Pulse:  79 86 69  Resp:  _0 Temp:  98.4 F (36.9 C) 98.1 F (36.7 C) 98.5 F (36.9 C)  TempSrc:  Oral Oral Oral  SpO2:  92% 91% 93%  Weight: 64.9 kg     Height:        Intake/Output Summary (Last 24 hours) at 12/01/2020 1253 Last data filed at 12/01/2020 1100 Gross per 24 hour  Intake 777 ml  Output 200 ml  Net 577 ml    Filed Weights   11/28/20 2319 12/01/20 0053  Weight: 63.7 kg 64.9 kg    Examination:  General: A/O x4, No acute respiratory distress Eyes: negative  scleral hemorrhage, negative anisocoria, negative icterus ENT: Negative Runny nose, negative gingival bleeding, Neck:  Negative scars, masses, torticollis, lymphadenopathy, JVD Lungs: Clear to auscultation bilaterally without wheezes or crackles Cardiovascular: Regular rate and rhythm without murmur gallop or rub normal S1 and S2 Abdomen: negative abdominal pain, nondistended, positive soft, bowel sounds, no rebound, no ascites, no appreciable mass Extremities: right upper extremity with 2 large lacerations, and large bruise bandaged with Ace bandage. Skin: Negative rashes, lesions, ulcers Psychiatric:  Negative depression, negative anxiety, negative fatigue, negative mania  Central nervous system:  Cranial nerves II through XII intact, tongue/uvula midline, all extremities muscle strength 5/5, sensation intact throughout, negative dysarthria, negative expressive aphasia, negative receptive aphasia.  .     Data Reviewed: Care during the described time interval was provided by me .  I have reviewed this patient's available data, including medical history, events of note, physical examination, and all test results as part of my  evaluation.  CBC: Recent Labs  Lab 11/28/20 1632 11/29/20 0344 11/30/20 0234 12/01/20 0328  WBC 11.3* 7.6 7.9 6.7  NEUTROABS 6.6  --   --   --   HGB 17.6* 15.9 14.3 13.1  HCT 52.6* 46.9 43.2 39.4  MCV 98.3 97.5 98.2 98.0  PLT 226 166 170 681    Basic Metabolic Panel: Recent Labs  Lab 11/28/20 1632 11/29/20 0344 11/30/20 0234 12/01/20 0328  NA 139 140 139 139  K 4.4 4.1 4.1 3.7  CL 102 109 111 109  CO2 _0 GLUCOSE 131* 103* 96 98  BUN _1 CREATININE 1.42* 1.18 1.12 1.19  CALCIUM 8.9 8.1* 8.2* 8.3*  MG  --  1.9 1.8 2.5*  PHOS  --  3.1 2.8 2.8    GFR: Estimated Creatinine Clearance: 32 mL/min (by C-G formula based on SCr of 1.19 mg/dL). Liver Function Tests: Recent Labs  Lab 11/28/20 1632 11/30/20 0234 12/01/20 0328  AST 58* 37 32  ALT 44 31 25  ALKPHOS 324* 239* 190*  BILITOT 0.6 1.1 1.0  PROT 7.4 5.4* 5.4*  ALBUMIN 3.0* 2.2* 2.8*    No results for input(s): LIPASE, AMYLASE in the last 168 hours. Recent Labs  Lab 11/29/20 0344 12/01/20 0328  AMMONIA 56* 33    Coagulation Profile: No results for input(s): INR, PROTIME in the last 168 hours. Cardiac Enzymes: No results for input(s): CKTOTAL, CKMB, CKMBINDEX, TROPONINI in the last 168 hours. BNP (last 3 results) Recent Labs    07/23/20 1023 08/02/20 1202  PROBNP 427.0* 354.0*    HbA1C: No results for input(s): HGBA1C in the last 72 hours. CBG: No results for input(s): GLUCAP in the last 168 hours. Lipid Profile: No results for input(s): CHOL, HDL, LDLCALC, TRIG, CHOLHDL, LDLDIRECT in the last 72 hours. Thyroid Function Tests: No results for input(s): TSH, T4TOTAL, FREET4, T3FREE, THYROIDAB in the last 72 hours. Anemia Panel: No results for input(s): VITAMINB12, FOLATE, FERRITIN, TIBC, IRON, RETICCTPCT in the last 72 hours. Sepsis Labs: No results for input(s): PROCALCITON, LATICACIDVEN in the last 168 hours.  Recent Results (from the past 240 hour(s))  Resp Panel by  RT-PCR (Flu A&B, Covid) Nasopharyngeal Swab     Status: None   Collection Time: 11/28/20  7:19 PM   Specimen: Nasopharyngeal Swab; Nasopharyngeal(NP) swabs in vial transport medium  Result Value Ref Range Status   SARS Coronavirus 2 by RT PCR NEGATIVE NEGATIVE Final    Comment: (NOTE) SARS-CoV-2 target nucleic acids are NOT DETECTED.  The SARS-CoV-2 RNA is generally detectable in upper respiratory specimens during the acute phase of infection. The lowest concentration of SARS-CoV-2 viral copies this assay can detect is 138 copies/mL. A negative result does not preclude SARS-Cov-2 infection and should not be used as the sole basis for treatment or other patient management decisions. A negative result may occur with  improper specimen collection/handling, submission of specimen other than nasopharyngeal swab, presence of viral mutation(s) within the areas targeted by this assay, and inadequate number of viral copies(<138 copies/mL). A negative result must be combined with clinical observations, patient history, and epidemiological information. The expected result is Negative.  Fact Sheet for Patients:  EntrepreneurPulse.com.au  Fact Sheet for Healthcare Providers:  IncredibleEmployment.be  This test is no t yet approved or cleared by the [place] FDA and  has been authorized for detection and/or diagnosis of SARS-CoV-2 by FDA under an Emergency Use Authorization (EUA). This EUA will remain  in effect (meaning this test can be used) for the duration of the COVID-19 declaration under Section 564(b)(1) of the Act, 21 U.S.C.section 360bbb-3(b)(1), unless the authorization is terminated  or revoked sooner.       Influenza A by PCR NEGATIVE NEGATIVE Final   Influenza B by PCR NEGATIVE NEGATIVE Final    Comment: (NOTE) The Xpert Xpress SARS-CoV-2/FLU/RSV plus assay is intended as an aid in the diagnosis of influenza from Nasopharyngeal swab  specimens and should not be used as a sole basis for treatment. Nasal washings and aspirates are unacceptable for Xpert Xpress SARS-CoV-2/FLU/RSV testing.  Fact Sheet for Patients: EntrepreneurPulse.com.au  Fact Sheet for Healthcare Providers: IncredibleEmployment.be  This test is not yet approved or cleared by the [place] FDA and has been authorized for detection and/or diagnosis of SARS-CoV-2 by FDA under an Emergency Use Authorization (EUA). This EUA will remain in effect (meaning this test can be used) for the duration of the COVID-19 declaration under Section 564(b)(1) of the Act, 21 U.S.C. section 360bbb-3(b)(1), unless the authorization is terminated or revoked.  Performed at [facility], [place] [address] [place], [place] [address]   Urine Culture     Status: None   Collection Time: 11/28/20  9:32 PM   Specimen: Urine, Random  Result Value Ref Range Status   Specimen Description URINE, RANDOM  Final   Special Requests NONE  Final   Culture   Final    NO GROWTH Performed at [facility], [place] [address] [place], [place] [address]    Report Status 11/30/2020 FINAL  Final          Radiology Studies: ECHOCARDIOGRAM COMPLETE  Result Date: 11/29/2020    ECHOCARDIOGRAM REPORT   Patient Name:   David WHITTINGHILL Sr. Date of Exam: 11/29/2020 Medical Rec #:  [id number]           Height:       60.0 in Accession #:    [id number]          Weight:       140.4 lb Date of Birth:  03-02-1927          BSA:          1.606 m Patient Age:    23 years            BP:           92/61 mmHg Patient Gender: M                   HR:  56 bpm. Exam Location:  Inpatient Procedure: 2D Echo, Color Doppler and Cardiac Doppler Indications:    R55 Syncope  History:        Patient has prior history of Echocardiogram examinations, most                 recent 08/27/2020. Risk Factors:Dyslipidemia.  Sonographer:    Raquel Sarna Senior RDCS Referring Phys:  [id number] [place]  1. Left ventricular ejection fraction, by estimation, is 60 to 65%. The left ventricle has normal function. Left ventricular endocardial border not optimally defined to evaluate regional wall motion. Left ventricular diastolic function could not be evaluated.  2. Right ventricular systolic function is normal. The right ventricular size is normal. Tricuspid regurgitation signal is inadequate for assessing PA pressure.  3. The mitral valve is normal in structure. Trivial mitral valve regurgitation. No evidence of mitral stenosis.  4. The AV is poorly visualized. There appears to be some degree of aortic stenosis possibly mild to moderate by doppler but cannot estimate with accuracy. Consider TEE for repeat limited echo of the AV to try to acquire clearer images. Aortic valve regurgitation is trivial. Aortic valve mean gradient measures 22.0 mmHg. Aortic valve Vmax measures 2.97 m/s.  5. The inferior vena cava is normal in size with greater than 50% respiratory variability, suggesting right atrial pressure of 3 mmHg.  6. There is a prominent epicardial fat pad and trivial pericardial effusion anterior to the right ventricle. FINDINGS  Left Ventricle: Left ventricular ejection fraction, by estimation, is 60 to 65%. The left ventricle has normal function. Left ventricular endocardial border not optimally defined to evaluate regional wall motion. The left ventricular internal cavity size was normal in size. There is no left ventricular hypertrophy. Left ventricular diastolic function could not be evaluated. Right Ventricle: The right ventricular size is normal. No increase in right ventricular wall thickness. Right ventricular systolic function is normal. Tricuspid regurgitation signal is inadequate for assessing PA pressure. Left Atrium: Left atrial size was normal in size. Right Atrium: Right atrial size was normal in size. Pericardium: Trivial pericardial effusion is present. The  pericardial effusion is anterior to the right ventricle. Presence of pericardial fat pad. Mitral Valve: The mitral valve is normal in structure. Mild to moderate mitral annular calcification. Trivial mitral valve regurgitation. No evidence of mitral valve stenosis. Tricuspid Valve: The tricuspid valve is normal in structure. Tricuspid valve regurgitation is not demonstrated. No evidence of tricuspid stenosis. Aortic Valve: The AV is poorly visualized. There appears to be some degree of aortic stenosis by doppler but cannot estimate with accuracy. Consider TEE for repeat limited echo of the AV to try to acquire clearer images. The aortic valve was not well visualized. Aortic valve regurgitation is trivial. Mild to moderate aortic stenosis is present. Aortic valve mean gradient measures 22.0 mmHg. Aortic valve peak gradient measures 35.3 mmHg. Aortic valve area, by VTI measures 0.54 cm. Pulmonic Valve: The pulmonic valve was not well visualized. Pulmonic valve regurgitation is trivial. No evidence of pulmonic stenosis. Aorta: The aortic root is normal in size and structure. Venous: The inferior vena cava is normal in size with greater than 50% respiratory variability, suggesting right atrial pressure of 3 mmHg. IAS/Shunts: The interatrial septum appears to be lipomatous. No atrial level shunt detected by color flow Doppler.  LEFT VENTRICLE PLAX 2D LVIDd:         4.80 cm  Diastology LVIDs:         3.10 cm  LV e' medial:  4.68 cm/s LV PW:         1.00 cm  LV e' lateral: 4.90 cm/s LV IVS:        1.10 cm LVOT diam:     1.90 cm LV SV:         42 LV SV Index:   26 LVOT Area:     2.84 cm  RIGHT VENTRICLE RV S prime:     7.83 cm/s TAPSE (M-mode): 1.8 cm LEFT ATRIUM             Index       RIGHT ATRIUM           Index LA Biplane Vol: 67.0 ml 41.71 ml/m RA Area:     15.10 cm                                     RA Volume:   37.50 ml  23.35 ml/m  AORTIC VALVE AV Area (Vmax):    0.64 cm AV Area (Vmean):   0.61 cm AV Area  (VTI):     0.54 cm AV Vmax:           297.00 cm/s AV Vmean:          228.000 cm/s AV VTI:            0.763 m AV Peak Grad:      35.3 mmHg AV Mean Grad:      22.0 mmHg LVOT Vmax:         67.20 cm/s LVOT Vmean:        48.950 cm/s LVOT VTI:          0.147 m LVOT/AV VTI ratio: 0.19  AORTA Ao Root diam: 3.40 cm  SHUNTS Systemic VTI:  0.15 m Systemic Diam: 1.90 cm Fransico Him MD Electronically signed by Fransico Him MD Signature Date/Time: 11/29/2020/3:14:04 PM    Final         Scheduled Meds:  heparin injection (subcutaneous)  5,000 Units Subcutaneous Q8H   lactulose  10 g Oral Daily   midodrine  10 mg Oral TID WC   multivitamin with minerals  1 tablet Oral BID   pantoprazole  40 mg Oral BID   Continuous Infusions:  sodium chloride 75 mL/hr at 11/30/20 2325   cefTRIAXone (ROCEPHIN)  IV 1 g (12/01/20 0046)     LOS: 2 days    Time spent:40 min    Reilyn Nelson, Geraldo Docker, MD [facility]   If 7PM-7AM, please contact night-coverage 12/01/2020, 12:53 PM

## 2020-12-01 NOTE — Progress Notes (Signed)
Occupational Therapy Treatment Patient Details Name: David DELAIR Sr. MRN: [id number] DOB: 03-02-1927 Today's Date: 12/01/2020    History of present illness David GORR Sr. is a 87 y.o. male who presents due to recurrent falls and presyncopal episodes on 6/15.  Associated with generalized weaknesswith medical history significant for TIA, HFpEF 50 to 55%, multiple gastric ulcers, severe esophagitis, hepatocellular carcinoma, liver cirrhosis with ascites with no prior history of alcohol abuse, hepatitis or autoimmune disorder, recent admission on April 2022 due to volume overload and orthostatic hypotension, discharged from the hospital after a week of hospitalization went to rehab for 3 weeks then returned home where he lives alone, his son lives 15 minutes away.   OT comments  Pt is making steady progress towards OT goals this session. Pt received supine in bed agreeable to OT session. Through session pt continues to be limited by dizziness, however, subsides with rest and time. Pt currently requires MINA  for bed mobility , No OOB transfer due to reports of dizziness and decreased BP (see below) in standing.  Pt would continue to benefit from acute and skilled HHOT  to improve overall functional mobility and to maximize independence with ADLs. DC and freq remains appropriate, OT will continue to follow.   Follow Up Recommendations  Home health OT;Supervision/Assistance - 24 hour;Other (comment) (initially)    Equipment Recommendations  None recommended by OT    Recommendations for Other Services      Precautions / Restrictions Precautions Precautions: Fall Precaution Comments: monitor BP, pt becomes dizzy with change in positions; pt legally blind Restrictions Weight Bearing Restrictions: No       Mobility Bed Mobility Overal bed mobility: Needs Assistance Bed Mobility: Supine to Sit;Sit to Supine     Supine to sit: Min assist;HOB elevated Sit to supine: HOB  elevated;Supervision   General bed mobility comments: pt requried light MIN A for trunk elevation to sit EOB, pt able to return to supine with minguard assist for safety and line mgmt    Transfers Overall transfer level: Needs assistance Equipment used: Rolling walker (2 wheeled) Transfers: Sit to/from Stand Sit to Stand: Min guard         General transfer comment: assist to rise and steady, VC's for hand placement and sequencing for safe sit to stand transition with RW.    Balance Overall balance assessment: Needs assistance Sitting-balance support: Feet supported Sitting balance-Leahy Scale: Fair     Standing balance support: Bilateral upper extremity supported Standing balance-Leahy Scale: Poor Standing balance comment: reliant on B UE support                           ADL either performed or assessed with clinical judgement   ADL Overall ADL's : Needs assistance/impaired     Grooming: Set up;Sitting Grooming Details (indicate cue type and reason): pt sat EOB with set up of cloth to wash face. some c/o dizziness but decreased with time.                 Toilet Transfer: Minimal assistance;Ambulation;RW Toilet Transfer Details (indicate cue type and reason): sit to stand transition while assessing BP. BP level in standing at 94/73, deferred transfer and presented pt back to supine.           General ADL Comments: while positioned back to supine, BP increased to 118/78 RN advised.     Vision       Perception  Praxis      Cognition Arousal/Alertness: Awake/alert Behavior During Therapy: WFL for tasks assessed/performed Overall Cognitive Status: Within Functional Limits for tasks assessed                                          Exercises     Shoulder Instructions       General Comments 113/69 supine,  105/68 sitting,  94/73 standing,  118/78 supine. while in standing pt reported feeling fine and was agreeable to  return into supine.    Pertinent Vitals/ Pain       Pain Assessment: No/denies pain  Home Living                                          Prior Functioning/Environment              Frequency  Min 2X/week        Progress Toward Goals  OT Goals(current goals can now be found in the care plan section)  Progress towards OT goals: Progressing toward goals  Acute Rehab OT Goals Patient Stated Goal: to figure out whats making him dizzy OT Goal Formulation: With patient Time For Goal Achievement: 12/13/20 Potential to Achieve Goals: Good ADL Goals Pt Will Perform Grooming: with supervision;standing Pt Will Perform Lower Body Dressing: with supervision;sit to/from stand Pt Will Transfer to Toilet: with supervision;ambulating;regular height toilet Pt Will Perform Toileting - Clothing Manipulation and hygiene: with supervision;sit to/from stand Additional ADL Goal #1: Pt will gather items necessary for ADL around his room with RW and supervision.  Plan Discharge plan remains appropriate;Frequency remains appropriate    Co-evaluation                 AM-PAC OT "6 Clicks" Daily Activity     Outcome Measure   Help from another person eating meals?: None Help from another person taking care of personal grooming?: A Little Help from another person toileting, which includes using toliet, bedpan, or urinal?: A Little Help from another person bathing (including washing, rinsing, drying)?: A Little Help from another person to put on and taking off regular upper body clothing?: A Little Help from another person to put on and taking off regular lower body clothing?: A Little 6 Click Score: 19    End of Session Equipment Utilized During Treatment: Gait belt;Rolling walker  OT Visit Diagnosis: Unsteadiness on feet (R26.81);Other abnormalities of gait and mobility (R26.89);Dizziness and giddiness (R42);Muscle weakness (generalized) (M62.81);Low vision, both eyes  (H54.2)   Activity Tolerance Patient tolerated treatment well   Patient Left in bed;with call bell/phone within reach;with bed alarm set   Nurse Communication Mobility status        Time: 0936-1000 OT Time Calculation (min): 24 min  Charges: OT General Charges $OT Visit: 1 Visit OT Treatments $Self Care/Home Management : 23-37 mins  Minus Breeding, MSOT, OTR/L  Supplemental Rehabilitation Services  [phone]    Marius Ditch 12/01/2020, 11:56 AM

## 2020-12-02 DIAGNOSIS — R42 Dizziness and giddiness: Secondary | ICD-10-CM | POA: Diagnosis not present

## 2020-12-02 DIAGNOSIS — N3000 Acute cystitis without hematuria: Secondary | ICD-10-CM | POA: Diagnosis not present

## 2020-12-02 DIAGNOSIS — R55 Syncope and collapse: Secondary | ICD-10-CM | POA: Diagnosis not present

## 2020-12-02 DIAGNOSIS — T148XXA Other injury of unspecified body region, initial encounter: Secondary | ICD-10-CM | POA: Diagnosis not present

## 2020-12-02 LAB — COMPREHENSIVE METABOLIC PANEL
ALT: 28 U/L (ref 0–44)
AST: 38 U/L (ref 15–41)
Albumin: 3.3 g/dL — ABNORMAL LOW (ref 3.5–5.0)
Alkaline Phosphatase: 199 U/L — ABNORMAL HIGH (ref 38–126)
Anion gap: 5 (ref 5–15)
BUN: 15 mg/dL (ref 8–23)
CO2: 22 mmol/L (ref 22–32)
Calcium: 8.3 mg/dL — ABNORMAL LOW (ref 8.9–10.3)
Chloride: 111 mmol/L (ref 98–111)
Creatinine, Ser: 1.06 mg/dL (ref 0.61–1.24)
GFR, Estimated: >60 mL/min (ref >60)
Glucose, Bld: 109 mg/dL — ABNORMAL HIGH (ref 70–99)
Potassium: 3.9 mmol/L (ref 3.5–5.1)
Sodium: 138 mmol/L (ref 135–145)
Total Bilirubin: 0.9 mg/dL (ref 0.3–1.2)
Total Protein: 5.6 g/dL — ABNORMAL LOW (ref 6.5–8.1)

## 2020-12-02 LAB — MAGNESIUM: Magnesium: 2.2 mg/dL (ref 1.7–2.4)

## 2020-12-02 LAB — CBC
HCT: 37.1 % — ABNORMAL LOW (ref 39.0–52.0)
Hemoglobin: 12.2 g/dL — ABNORMAL LOW (ref 13.0–17.0)
MCH: 32.4 pg (ref 26.0–34.0)
MCHC: 32.9 g/dL (ref 30.0–36.0)
MCV: 98.4 fL (ref 80.0–100.0)
Platelets: 152 10*3/uL (ref 150–400)
RBC: 3.77 MIL/uL — ABNORMAL LOW (ref 4.22–5.81)
RDW: 13 % (ref 11.5–15.5)
WBC: 6.2 10*3/uL (ref 4.0–10.5)
nRBC: 0 % (ref 0.0–0.2)

## 2020-12-02 LAB — PHOSPHORUS: Phosphorus: 2.5 mg/dL (ref 2.5–4.6)

## 2020-12-02 LAB — AMMONIA: Ammonia: 34 umol/L (ref 9–35)

## 2020-12-02 MED ORDER — CARVEDILOL 3.125 MG PO TABS
3.1250 mg | ORAL_TABLET | Freq: Two times a day (BID) | ORAL | Status: DC
  Administered 2020-12-02: 3.125 mg via ORAL
  Filled 2020-12-02 (×2): qty 1

## 2020-12-02 MED ORDER — CARVEDILOL 3.125 MG PO TABS
3.1250 mg | ORAL_TABLET | Freq: Two times a day (BID) | ORAL | Status: DC
  Administered 2020-12-02 – 2020-12-03 (×2): 3.125 mg via ORAL
  Filled 2020-12-02: qty 1

## 2020-12-02 NOTE — Plan of Care (Signed)
  Problem: Health Behavior/Discharge Planning: Goal: Ability to manage health-related needs will improve Outcome: Progressing   Problem: Clinical Measurements: Goal: Ability to maintain clinical measurements within normal limits will improve Outcome: Progressing   Problem: Activity: Goal: Risk for activity intolerance will decrease Outcome: Progressing   

## 2020-12-02 NOTE — TOC Progression Note (Signed)
Transition of Care (TOC) - Progression Note    Patient Details  Name: David MCISAAC Sr. MRN: [id number] Date of Birth: [birth year]  Transition of Care [facility]) CM/SW Contact  Bartholomew Crews, RN Phone Number: [phone] 12/02/2020, 11:58 AM  Clinical Narrative:     Referral for [facility] accepted by [facility]. HH orders placed by MD yesterday. DME 3-N-1 delivered to room yesterday afternoon by [facility]. TOC following for transition needs.   Expected Discharge Plan: [place] Barriers to Discharge: Continued Medical Work up  Expected Discharge Plan and Services Expected Discharge Plan: [place]   Discharge Planning Services: CM Consult Post Acute Care Choice: [place] arrangements for the past 2 months: Single Family Home                 DME Arranged: 3-N-1 DME Agency: [facility] Date DME Agency Contacted: 12/02/20 Time DME Agency Contacted: 1000 Representative spoke with at DME Agency: [place]: RN, PT, [place], Social Work, Nurse's Aide [place]: [place] Date [place]: 12/02/20 Time [place]: 1157 Representative spoke with at [place]: [place]   Social Determinants of Health ([place]) Interventions    Readmission Risk Interventions No flowsheet data found.

## 2020-12-02 NOTE — Plan of Care (Signed)
  Problem: Skin Integrity: Goal: Risk for impaired skin integrity will decrease Outcome: Progressing   

## 2020-12-02 NOTE — Progress Notes (Signed)
PROGRESS NOTE    David Bradshaw  LPF:[id number] DOB: [birth year] DOA: 11/28/2020 PCP: Pleas Koch, NP     Brief Narrative:  David VIOLETT Sr. is a 87 y.o. WM PMHx , TIA, HFpEF 50 to 55%, aortic stenosis, MI 1988 and 1990, multiple gastric ulcers, severe esophagitis, hepatocellular carcinoma, liver cirrhosis with ascites with no prior history of alcohol abuse, hepatitis or autoimmune disorder, recent admission on April 2022 due to volume overload and orthostatic hypotension, discharged from the hospital after a week of hospitalization went to rehab for 3 weeks then returned home where he lives alone, his son lives 15 minutes away.    Presents today due to recurrent falls and presyncopal episodes.  Associated with generalized weakness, the feeling that he is about to pass out when he stands and falls.  Reported loose stools intermittently.  No cardiopulmonary symptoms.  Denies fever chills or headaches.  Work-up in the ED revealed orthostatic hypotension.  Concern for dehydration with elevated creatinine, possible UTI.  EDP requested admission.  TRH, hospitalist team, was asked to admit.   ED Course:  Temperature 97.9.  BP 97/82, pulse 89, respiration 18, O2 saturation 92% on room air.  Lab studies remarkable for serum bicarb 26, glucose 131, BUN 23, creatinine 1.42, anion gap 11, GFR 47.  BNP 363, troponin 22 from 19.  WBC 11.3, hemoglobin 17.6, MCV 98, platelet 226.  Urine analysis WBC UA 11-20, leukocyte UA small.   Subjective: 6/19 patient has had several runs of SVT overnight longest being 25 beats.  Through each episode patient has been asymptomatic.  All electrolytes WNL.  Afebrile overnight    Assessment & Plan: Covid vaccination;   Active Problems:   Cirrhosis of liver with ascites (HCC)   Hepatocellular carcinoma (HCC)   Postural dizziness with presyncope   Orthostatic hypotension   Acute lower UTI   Acute metabolic encephalopathy   Chronic diastolic CHF  (congestive heart failure) (HCC)  Postural dizziness with presyncope -Presented with generalized weakness and recurrent falls - Every shift orthostatic vitals -Continue fall precautions -PT /OT; home health -6/17 palliative care met with patient and son;Patient and [name] would like to continue with their current home health services and PT for now along with [facility] who they are enrolled with. If patient goes home and declines- then they wish to transition to Hospice    Orthostatic hypotension, suspect autonomic dysfunction -Maintain MAP greater than 65 -Orthostatic vitals every shift -Continue fall precautions -6/16 patient is not orthostatic however BPs are on the low side. -6/16 increase Midodrine 10 mg TID -6/17 even after increase in midodrine patient orthostatic: Lying to sitting -6/17 Albumin 50 g - 6/17 TED hose knee-high -6/18 Albumin 50 g + normal saline 30m/hr  SVT - 6/19 patient has had multiple runs overnight of SVT (longest 25 beats).  Asymptomatic each time. - 6/19 given his orthostatic hypotension we are in a difficult position regarding starting a medication.  We will try low-dose Coreg 3.125 mg BID -6/19 spoke at length with patient and son and daughter-in-law concerning patient's multiple episodes of SVT.  Counseled that normally we would get cardiology involved, both patient and family stated they did not want any involvement by cardiology.  Would like to be discharged in the a.m. regardless of if low-dose beta-blocker worked or not.   Presumptive UTI, POA -Urine culture negative -DC antibiotics    Acute metabolic encephalopathy Unclear of baseline mentation -A/O x4 at time of this exam. -Ammonia level  slightly elevated =56 -Lactulose 10 g daily   Hepatocellular carcinoma/cirrhosis with ascites -No prior history of alcohol use or hepatitis disease or autoimmune disorder. -Follows with GI outpatient. -Poor prognosis -Palliative care team consult  to assist with establishing goals of care.   History of esophagitis -Resume home PPI twice daily.   Chronic lower extremity edema -Trace edema in lower extremities bilaterally on exam. -He is on p.o. Lasix and p.o. spironolactone at home (held) -Elevated BNP however no clear evidence of volume overload on exam. -Last 2D echo was on March 2022 revealing LVEF 50 to 55%   Chronic diastolic CHF -Euvolemic on exam - Strict in and out +3.5 L - Daily weight Filed Weights   12/01/20 0053 12/01/20 2100 12/02/20 0532  Weight: 64.9 kg 68.2 kg 68.4 kg  -Echocardiogram: Diastolic function could not be evaluated see below             Goals of care - 6/18 place face-to-face request for PT, OT, RN, wound care, social work    DVT prophylaxis: Subcu heparin Code Status: DNR Family Communication: 6/19 son at bedside for discussion of plan of care all questions answered Status is: Inpatient    Dispo: The patient is from: Home              Anticipated d/c is to: Home              Anticipated d/c date is: 6/19              Patient currently unstable      Consultants:  Palliative care   Procedures/Significant Events:  6/15 CXR:. Probable chronic interstitial disease at the bases with emphysema. There is a small left effusion 2. Convex right mediastinal opacity probably due to aortic tortuosity and augmented by rotated image. CT chest follow-up if deemed clinically appropriate 3. Possible fracture deformity of the distal right clavicle 6/16 Echocardiogram:Left Ventricle: LVEF= 60 to 65%.  Left ventricular diastolic function could not be evaluated.   Right Ventricle: The right ventricular size is normal. No increase in  right ventricular wall thickness. Right ventricular systolic function is  normal. Tricuspid regurgitation signal is inadequate for assessing PA  pressure.    Aortic Valve:  Mild to moderate aortic stenosis is present.  I have personally reviewed and  interpreted all radiology studies and my findings are as above.  VENTILATOR SETTINGS:    Cultures 6/15 SARS coronavirus negative 6/15 influenza A/B negative 6/15 urine negative   Antimicrobials: Anti-infectives (From admission, onward)    Start     Dose/Rate Ordered Stop   11/28/20 2230  cefTRIAXone (ROCEPHIN) 1 g in sodium chloride 0.9 % 100 mL IVPB        1 g 200 mL/hr over 30 Minutes 11/28/20 2224     11/28/20 2145  cephALEXin (KEFLEX) capsule 500 mg        500 mg 11/28/20 2131 11/28/20 2142           Devices    LINES / TUBES:      Continuous Infusions:  sodium chloride 75 mL/hr at 12/02/20 0017     Objective: Vitals:   12/02/20 0527 12/02/20 0532 12/02/20 0614 12/02/20 1010  BP: 115/78  115/75 116/69  Pulse: 87  88 86  Resp: 18  18 15   Temp: 98.4 F (36.9 C)  98.2 F (36.8 C) 98.2 F (36.8 C)  TempSrc: Oral  Oral Oral  SpO2: 91%  91% 91%  Weight:  68.4 kg  Height:        Intake/Output Summary (Last 24 hours) at 12/02/2020 1331 Last data filed at 12/02/2020 0600 Gross per 24 hour  Intake 1316.74 ml  Output 600 ml  Net 716.74 ml    Filed Weights   12/01/20 0053 12/01/20 2100 12/02/20 0532  Weight: 64.9 kg 68.2 kg 68.4 kg    Examination:  General: A/O x4, No acute respiratory distress Eyes: negative scleral hemorrhage, negative anisocoria, negative icterus ENT: Negative Runny nose, negative gingival bleeding, Neck:  Negative scars, masses, torticollis, lymphadenopathy, JVD Lungs: Clear to auscultation bilaterally without wheezes or crackles Cardiovascular: Regular rate and rhythm without murmur gallop or rub normal S1 and S2 Abdomen: negative abdominal pain, nondistended, positive soft, bowel sounds, no rebound, no ascites, no appreciable mass Extremities: right upper extremity with 2 large lacerations, and large bruise bandaged with Ace bandage. Skin: Negative rashes, lesions, ulcers Psychiatric:  Negative depression, negative anxiety,  negative fatigue, negative mania  Central nervous system:  Cranial nerves II through XII intact, tongue/uvula midline, all extremities muscle strength 5/5, sensation intact throughout, negative dysarthria, negative expressive aphasia, negative receptive aphasia.  .     Data Reviewed: Care during the described time interval was provided by me .  I have reviewed this patient's available data, including medical history, events of note, physical examination, and all test results as part of my evaluation.  CBC: Recent Labs  Lab 11/28/20 1632 11/29/20 0344 11/30/20 0234 12/01/20 0328 12/02/20 0249  WBC 11.3* 7.6 7.9 6.7 6.2  NEUTROABS 6.6  --   --   --   --   HGB 17.6* 15.9 14.3 13.1 12.2*  HCT 52.6* 46.9 43.2 39.4 37.1*  MCV 98.3 97.5 98.2 98.0 98.4  PLT 226 166 170 160 161    Basic Metabolic Panel: Recent Labs  Lab 11/28/20 1632 11/29/20 0344 11/30/20 0234 12/01/20 0328 12/02/20 0249  NA 139 140 139 139 138  K 4.4 4.1 4.1 3.7 3.9  CL 102 109 111 109 111  CO2 26 23 22 24 22   GLUCOSE 131* 103* 96 98 109*  BUN 23 20 17 15 15   CREATININE 1.42* 1.18 1.12 1.19 1.06  CALCIUM 8.9 8.1* 8.2* 8.3* 8.3*  MG  --  1.9 1.8 2.5* 2.2  PHOS  --  3.1 2.8 2.8 2.5    GFR: Estimated Creatinine Clearance: 36.9 mL/min (by C-G formula based on SCr of 1.06 mg/dL). Liver Function Tests: Recent Labs  Lab 11/28/20 1632 11/30/20 0234 12/01/20 0328 12/02/20 0249  AST 58* 37 32 38  ALT 44 31 25 28   ALKPHOS 324* 239* 190* 199*  BILITOT 0.6 1.1 1.0 0.9  PROT 7.4 5.4* 5.4* 5.6*  ALBUMIN 3.0* 2.2* 2.8* 3.3*    No results for input(s): LIPASE, AMYLASE in the last 168 hours. Recent Labs  Lab 11/29/20 0344 12/01/20 0328 12/02/20 0249  AMMONIA 56* 33 34    Coagulation Profile: No results for input(s): INR, PROTIME in the last 168 hours. Cardiac Enzymes: No results for input(s): CKTOTAL, CKMB, CKMBINDEX, TROPONINI in the last 168 hours. BNP (last 3 results) Recent Labs     07/23/20 1023 08/02/20 1202  PROBNP 427.0* 354.0*    HbA1C: No results for input(s): HGBA1C in the last 72 hours. CBG: No results for input(s): GLUCAP in the last 168 hours. Lipid Profile: No results for input(s): CHOL, HDL, LDLCALC, TRIG, CHOLHDL, LDLDIRECT in the last 72 hours. Thyroid Function Tests: No results for input(s): TSH, T4TOTAL, FREET4, T3FREE, THYROIDAB in the  last 72 hours. Anemia Panel: No results for input(s): VITAMINB12, FOLATE, FERRITIN, TIBC, IRON, RETICCTPCT in the last 72 hours. Sepsis Labs: No results for input(s): PROCALCITON, LATICACIDVEN in the last 168 hours.  Recent Results (from the past 240 hour(s))  Resp Panel by RT-PCR (Flu A&B, Covid) Nasopharyngeal Swab     Status: None   Collection Time: 11/28/20  7:19 PM   Specimen: Nasopharyngeal Swab; Nasopharyngeal(NP) swabs in vial transport medium  Result Value Ref Range Status   SARS Coronavirus 2 by RT PCR NEGATIVE NEGATIVE Final    Comment: (NOTE) SARS-CoV-2 target nucleic acids are NOT DETECTED.  The SARS-CoV-2 RNA is generally detectable in upper respiratory specimens during the acute phase of infection. The lowest concentration of SARS-CoV-2 viral copies this assay can detect is 138 copies/mL. A negative result does not preclude SARS-Cov-2 infection and should not be used as the sole basis for treatment or other patient management decisions. A negative result may occur with  improper specimen collection/handling, submission of specimen other than nasopharyngeal swab, presence of viral mutation(s) within the areas targeted by this assay, and inadequate number of viral copies(<138 copies/mL). A negative result must be combined with clinical observations, patient history, and epidemiological information. The expected result is Negative.  Fact Sheet for Patients:  EntrepreneurPulse.com.au  Fact Sheet for Healthcare Providers:  IncredibleEmployment.be  This  test is no t yet approved or cleared by the [place] FDA and  has been authorized for detection and/or diagnosis of SARS-CoV-2 by FDA under an Emergency Use Authorization (EUA). This EUA will remain  in effect (meaning this test can be used) for the duration of the COVID-19 declaration under Section 564(b)(1) of the Act, 21 U.S.C.section 360bbb-3(b)(1), unless the authorization is terminated  or revoked sooner.       Influenza A by PCR NEGATIVE NEGATIVE Final   Influenza B by PCR NEGATIVE NEGATIVE Final    Comment: (NOTE) The Xpert Xpress SARS-CoV-2/FLU/RSV plus assay is intended as an aid in the diagnosis of influenza from Nasopharyngeal swab specimens and should not be used as a sole basis for treatment. Nasal washings and aspirates are unacceptable for Xpert Xpress SARS-CoV-2/FLU/RSV testing.  Fact Sheet for Patients: EntrepreneurPulse.com.au  Fact Sheet for Healthcare Providers: IncredibleEmployment.be  This test is not yet approved or cleared by the [place] FDA and has been authorized for detection and/or diagnosis of SARS-CoV-2 by FDA under an Emergency Use Authorization (EUA). This EUA will remain in effect (meaning this test can be used) for the duration of the COVID-19 declaration under Section 564(b)(1) of the Act, 21 U.S.C. section 360bbb-3(b)(1), unless the authorization is terminated or revoked.  Performed at [facility], [place] [address] [place], [place] [address]   Urine Culture     Status: None   Collection Time: 11/28/20  9:32 PM   Specimen: Urine, Random  Result Value Ref Range Status   Specimen Description URINE, RANDOM  Final   Special Requests NONE  Final   Culture   Final    NO GROWTH Performed at [facility], [place] [address], [place], [place] [address]    Report Status 11/30/2020 FINAL  Final          Radiology Studies: No results found.      Scheduled Meds:  heparin  injection (subcutaneous)  5,000 Units Subcutaneous Q8H   lactulose  10 g Oral Daily   midodrine  10 mg Oral TID WC   multivitamin with minerals  1 tablet Oral  BID   pantoprazole  40 mg Oral BID   Continuous Infusions:  sodium chloride 75 mL/hr at 12/02/20 0017     LOS: 3 days    Time spent:40 min    Myron Lona, Geraldo Docker, MD [facility]   If 7PM-7AM, please contact night-coverage 12/02/2020, 1:31 PM

## 2020-12-02 NOTE — Progress Notes (Signed)
notified by telemetry patient had 25 runs of [place]. checked on patient, pt asleep, denies complaints. checked VS BP 115/75 map 88, HR 88, 91% on RA, RR 18. Send secure chat to MD [facility] night time provider. Awaiting response

## 2020-12-03 ENCOUNTER — Other Ambulatory Visit (HOSPITAL_COMMUNITY): Payer: Self-pay

## 2020-12-03 DIAGNOSIS — K7460 Unspecified cirrhosis of liver: Secondary | ICD-10-CM

## 2020-12-03 DIAGNOSIS — G9341 Metabolic encephalopathy: Secondary | ICD-10-CM | POA: Diagnosis not present

## 2020-12-03 DIAGNOSIS — R188 Other ascites: Secondary | ICD-10-CM

## 2020-12-03 DIAGNOSIS — N3000 Acute cystitis without hematuria: Secondary | ICD-10-CM | POA: Diagnosis not present

## 2020-12-03 DIAGNOSIS — R55 Syncope and collapse: Secondary | ICD-10-CM | POA: Diagnosis not present

## 2020-12-03 DIAGNOSIS — N390 Urinary tract infection, site not specified: Secondary | ICD-10-CM | POA: Diagnosis not present

## 2020-12-03 LAB — COMPREHENSIVE METABOLIC PANEL
ALT: 36 U/L (ref 0–44)
AST: 50 U/L — ABNORMAL HIGH (ref 15–41)
Albumin: 2.8 g/dL — ABNORMAL LOW (ref 3.5–5.0)
Alkaline Phosphatase: 235 U/L — ABNORMAL HIGH (ref 38–126)
Anion gap: 8 (ref 5–15)
BUN: 12 mg/dL (ref 8–23)
CO2: 22 mmol/L (ref 22–32)
Calcium: 8.5 mg/dL — ABNORMAL LOW (ref 8.9–10.3)
Chloride: 109 mmol/L (ref 98–111)
Creatinine, Ser: 1.01 mg/dL (ref 0.61–1.24)
GFR, Estimated: >60 mL/min (ref >60)
Glucose, Bld: 139 mg/dL — ABNORMAL HIGH (ref 70–99)
Potassium: 4.4 mmol/L (ref 3.5–5.1)
Sodium: 139 mmol/L (ref 135–145)
Total Bilirubin: 1 mg/dL (ref 0.3–1.2)
Total Protein: 5.6 g/dL — ABNORMAL LOW (ref 6.5–8.1)

## 2020-12-03 LAB — AMMONIA: Ammonia: 37 umol/L — ABNORMAL HIGH (ref 9–35)

## 2020-12-03 LAB — PHOSPHORUS: Phosphorus: 2.1 mg/dL — ABNORMAL LOW (ref 2.5–4.6)

## 2020-12-03 LAB — MAGNESIUM: Magnesium: 2 mg/dL (ref 1.7–2.4)

## 2020-12-03 MED ORDER — CARVEDILOL 3.125 MG PO TABS
3.1250 mg | ORAL_TABLET | Freq: Two times a day (BID) | ORAL | 0 refills | Status: AC
  Filled 2020-12-03: qty 60, 30d supply, fill #0

## 2020-12-03 MED ORDER — LACTULOSE ENCEPHALOPATHY 10 GM/15ML PO SOLN
10.0000 g | Freq: Every day | ORAL | 0 refills | Status: AC
  Filled 2020-12-03: qty 236, 15d supply, fill #0

## 2020-12-03 MED ORDER — MIDODRINE HCL 10 MG PO TABS
10.0000 mg | ORAL_TABLET | Freq: Three times a day (TID) | ORAL | 0 refills | Status: AC
  Filled 2020-12-03: qty 90, 30d supply, fill #0

## 2020-12-03 MED ORDER — POTASSIUM CHLORIDE 10 MEQ/100ML IV SOLN
10.0000 meq | INTRAVENOUS | Status: AC
  Administered 2020-12-03 (×2): 10 meq via INTRAVENOUS
  Filled 2020-12-03 (×2): qty 100

## 2020-12-03 NOTE — Plan of Care (Signed)
  Problem: Health Behavior/Discharge Planning: Goal: Ability to manage health-related needs will improve Outcome: Adequate for Discharge   

## 2020-12-03 NOTE — Plan of Care (Signed)
  Problem: Health Behavior/Discharge Planning: Goal: Ability to manage health-related needs will improve Outcome: Progressing   Problem: Clinical Measurements: Goal: Ability to maintain clinical measurements within normal limits will improve Outcome: Progressing   Problem: Activity: Goal: Risk for activity intolerance will decrease Outcome: Progressing   

## 2020-12-03 NOTE — Progress Notes (Signed)
Physical Therapy Treatment Patient Details Name: David Bradshaw. MRN: [id number] DOB: 02-26-1927 Today's Date: 12/03/2020    History of Present Illness Guerin Lashomb. is a 87 y.o. male who presents due to recurrent falls and presyncopal episodes on 6/15.  Associated with generalized weaknesswith medical history significant for TIA, HFpEF 50 to 55%, multiple gastric ulcers, severe esophagitis, hepatocellular carcinoma, liver cirrhosis with ascites with no prior history of alcohol abuse, hepatitis or autoimmune disorder, recent admission on April 2022 due to volume overload and orthostatic hypotension, discharged from the hospital after a week of hospitalization went to rehab for 3 weeks then returned home where he lives alone, his son lives 15 minutes away.    PT Comments    Pt in bed with call bell activated. Pt's lunch in room and asks to be set up in bed. PT request pt get out of bed to eat. Pt refuses as he does not want to be left up in chair too long. NT agrees to only one hour up in chair. Pt then agreeable to get to chair. Pt is min A as he wants pull against PT to come to [facility] and for transfer and ambulation to chair. Pt's son in room at end of session and pt set up for lunch. Pt's son reports 5 steps to enter home in which he lives and where pt will d/c to. Pt request to eat lunch while hot. Pt has adequate strength for stairs with assist from son. PT will follow back for stair training as able.    Follow Up Recommendations  Home health PT;Supervision/Assistance - 24 hour     Equipment Recommendations  None recommended by PT (has necessary equipment)       Precautions / Restrictions Precautions Precautions: Fall Precaution Comments: monitor for dizziness Restrictions Weight Bearing Restrictions: No    Mobility  Bed Mobility Overal bed mobility: Needs Assistance Bed Mobility: Supine to Sit;Sit to Supine     Supine to sit: Min assist    General bed mobility  comments: pulled up on therapist's hand, slight dizziness with coming to seated dissipated quickly    Transfers Overall transfer level: Needs assistance Equipment used: Rolling walker (2 wheeled) Transfers: Sit to/from Stand Sit to Stand: Min guard         General transfer comment: min guard for safety with power up, cued for assessing dizziness in standing, reports no dizziness  Ambulation/Gait Ambulation/Gait assistance: Min assist Gait Distance (Feet): 3 Feet Assistive device: 1 person hand held assist   Gait velocity: slowed Gait velocity interpretation: <1.31 ft/sec, indicative of household ambulator General Gait Details: pt reached for PT hands for stepping from bed to recliner to eat his lunch         Balance Overall balance assessment: Needs assistance         Standing balance support: Bilateral upper extremity supported Standing balance-Leahy Scale: Poor Standing balance comment: reliant on B UE support for dynamic balance                            Cognition Arousal/Alertness: Awake/alert Behavior During Therapy: WFL for tasks assessed/performed Overall Cognitive Status: Within Functional Limits for tasks assessed                                           General Comments General  comments (skin integrity, edema, etc.): Pt's dinner in room and pt reports not wanting to get to chair to eat because he does not want to be left in recliner, able to convince pt to sit in chair, son arrived and encouraged pt movement.      Pertinent Vitals/Pain Pain Assessment: Faces Faces Pain Scale: Hurts little more Pain Location: R UE Pain Descriptors / Indicators: Discomfort Pain Intervention(s): Ice applied (elevated on pillows)     PT Goals (current goals can now be found in the care plan section) Acute Rehab PT Goals Patient Stated Goal: return home with son PT Goal Formulation: With patient Time For Goal Achievement:  12/13/20 Potential to Achieve Goals: Good Progress towards PT goals: Progressing toward goals    Frequency    Min 3X/week      PT Plan Discharge plan needs to be updated       AM-PAC PT "6 Clicks" Mobility   Outcome Measure  Help needed turning from your back to your side while in a flat bed without using bedrails?: None Help needed moving from lying on your back to sitting on the side of a flat bed without using bedrails?: A Little Help needed moving to and from a bed to a chair (including a wheelchair)?: A Little Help needed standing up from a chair using your arms (e.g., wheelchair or bedside chair)?: A Little Help needed to walk in hospital room?: A Little Help needed climbing 3-5 steps with a railing? : A Little 6 Click Score: 19    End of Session Equipment Utilized During Treatment: Gait belt Activity Tolerance: Patient tolerated treatment well;Other (comment) Patient left: in chair;with chair alarm set;with call bell/phone within reach Nurse Communication: Mobility status PT Visit Diagnosis: Unsteadiness on feet (R26.81);Other abnormalities of gait and mobility (R26.89);Muscle weakness (generalized) (M62.81)     Time: 1610-9604 PT Time Calculation (min) (ACUTE ONLY): 13 min  Charges:  $Therapeutic Activity: 8-22 mins                     Jakyron Fabro B. Migdalia Dk PT, DPT Acute Rehabilitation Services Pager [phone] Office [phone]    Arab 12/03/2020, 12:12 PM

## 2020-12-03 NOTE — Progress Notes (Signed)
Orthostatic Blood Pressure:  Blood pressure:   lying 126/90, sitting 128/86, standing @ 0 min 123/75, standing @ 3 min 116/90  Pulse:   lying 89, sitting 90, standing @ 0 min 88, standing @ 3 min

## 2020-12-03 NOTE — Progress Notes (Signed)
22:35pm - notified by telemetry pt. Hhas off and on runs of VTACH, a burst Vtach then goes back down to NSR. Checked pt. denies complaints, VS stable. Secure chat MD Opyd to make aware, response received, as VS stable, just continue to monitor patient  04:55: notified by telemetry patient runs SVT in 180's, check pt. No complaints. Checked BP 96/58 map of 71. Secure chat MD Opyd, received response, see new orders.

## 2020-12-03 NOTE — Progress Notes (Signed)
DISCHARGE NOTE HOME David SAVANT Sr. to be discharged Home per MD order. Discussed prescriptions and follow up appointments with the patient. Prescriptions given to patient; medication list explained in detail. Patient verbalized understanding.  Skin clean, dry and intact without evidence of skin break down, no evidence of skin tears noted. IV catheter discontinued intact. Site without signs and symptoms of complications. Dressing and pressure applied. Pt denies pain at the site currently. No complaints noted.  Patient free of lines, drains, and wounds.   An After Visit Summary (AVS) was printed and given to the patient. Patient escorted via wheelchair, and discharged home via private auto.  Mikki Santee, RN

## 2020-12-03 NOTE — Progress Notes (Signed)
Occupational Therapy Treatment Patient Details Name: David WOLD Sr. MRN: [id number] DOB: [birth year] Today's Date: 12/03/2020    History of present illness David PALOS Sr. is a 87 y.o. male who presents due to recurrent falls and presyncopal episodes on 6/15.  Associated with generalized weaknesswith medical history significant for TIA, HFpEF 50 to 55%, multiple gastric ulcers, severe esophagitis, hepatocellular carcinoma, liver cirrhosis with ascites with no prior history of alcohol abuse, hepatitis or autoimmune disorder, recent admission on April 2022 due to volume overload and orthostatic hypotension, discharged from the hospital after a week of hospitalization went to rehab for 3 weeks then returned home where he lives alone, his son lives 15 minutes away.   OT comments  Pt without c/o dizziness with OOB mobility. Noted to be wearing compression socks. Pt ambulated around bed to chair, to bathroom and sink and returned to bed with min guard assist and RW. Instructed pt to check for dizziness prior to transitioning from sitting to standing. Pt with c/o of R UE pain, elevated on 2 pillows and applied ice, RN notified.   Follow Up Recommendations  Home health OT;Supervision/Assistance - 24 hour;Other (comment)    Equipment Recommendations  None recommended by OT    Recommendations for Other Services      Precautions / Restrictions Precautions Precautions: Fall Precaution Comments: monitor for dizziness       Mobility Bed Mobility Overal bed mobility: Needs Assistance Bed Mobility: Supine to Sit;Sit to Supine     Supine to sit: Min assist Sit to supine: Modified independent (Device/Increase time)   General bed mobility comments: pulled up on therapist's hand    Transfers Overall transfer level: Needs assistance Equipment used: Rolling walker (2 wheeled) Transfers: Sit to/from Stand Sit to Stand: Supervision         General transfer comment: no physical assist,  cues for hand placement    Balance Overall balance assessment: Needs assistance         Standing balance support: Bilateral upper extremity supported Standing balance-Leahy Scale: Poor Standing balance comment: reliant on B UE support for dynamic balance                           ADL either performed or assessed with clinical judgement   ADL Overall ADL's : Needs assistance/impaired                 Upper Body Dressing : Set up;Sitting   Lower Body Dressing: Maximal assistance;Bed level Lower Body Dressing Details (indicate cue type and reason): compressions socks Toilet Transfer: Min guard;Ambulation;Regular Museum/gallery exhibitions officer and Hygiene: Min guard;Sit to/from stand       Functional mobility during ADLs: Min guard;Rolling walker (cues to slow pace) General ADL Comments: instructed pt to transition into positions slowly and monitor himself for dizziness     Vision       Perception     Praxis      Cognition Arousal/Alertness: Awake/alert Behavior During Therapy: WFL for tasks assessed/performed Overall Cognitive Status: Within Functional Limits for tasks assessed                                          Exercises     Shoulder Instructions       General Comments      Pertinent Vitals/ Pain  Pain Assessment: Faces Faces Pain Scale: Hurts little more Pain Location: R UE Pain Descriptors / Indicators: Discomfort Pain Intervention(s): Ice applied (elevated on pillows)  Home Living                                          Prior Functioning/Environment              Frequency  Min 2X/week        Progress Toward Goals  OT Goals(current goals can now be found in the care plan section)  Progress towards OT goals: Progressing toward goals  Acute Rehab OT Goals Patient Stated Goal: return home with son OT Goal Formulation: With patient Time For Goal Achievement:  12/13/20 Potential to Achieve Goals: Good  Plan Discharge plan remains appropriate;Frequency remains appropriate    Co-evaluation                 AM-PAC OT "6 Clicks" Daily Activity     Outcome Measure   Help from another person eating meals?: None Help from another person taking care of personal grooming?: A Little Help from another person toileting, which includes using toliet, bedpan, or urinal?: A Little Help from another person bathing (including washing, rinsing, drying)?: A Little Help from another person to put on and taking off regular upper body clothing?: A Little Help from another person to put on and taking off regular lower body clothing?: A Little 6 Click Score: 19    End of Session Equipment Utilized During Treatment: Gait belt;Rolling walker  OT Visit Diagnosis: Unsteadiness on feet (R26.81);Other abnormalities of gait and mobility (R26.89);Dizziness and giddiness (R42);Muscle weakness (generalized) (M62.81);Low vision, both eyes (H54.2)   Activity Tolerance Patient tolerated treatment well   Patient Left in bed;with call bell/phone within reach;with bed alarm set (declined remaining up in chair)   Nurse Communication          Time: 1110-1140 OT Time Calculation (min): 30 min  Charges: OT General Charges $OT Visit: 1 Visit OT Treatments $Self Care/Home Management : 23-37 mins  Nestor Lewandowsky, OTR/L Acute Rehabilitation Services Pager: [phone] Office: [phone]  Malka So 12/03/2020, 12:00 PM

## 2020-12-03 NOTE — Discharge Summary (Signed)
Physician Discharge Summary  David MESKILL Sr. UXN:[id number] DOB: August 03, 1926 DOA: 11/28/2020  PCP: Pleas Koch, NP  Admit date: 11/28/2020 Discharge date: 12/03/2020  Time spent: 35 minutes  Recommendations for Outpatient Follow-up:   Postural dizziness with presyncope -Presented with generalized weakness and recurrent falls - Every shift orthostatic vitals -Continue fall precautions -PT /OT; home health -6/17 palliative care met with patient and son;Patient and [name] would like to continue with their current home health services and PT for now along with [facility] who they are enrolled with. If patient goes home and declines- then they wish to transition to Hospice     Orthostatic hypotension, suspect autonomic dysfunction -Maintain MAP greater than 65 -6/16 patient is not orthostatic however BPs are on the low side. -6/16 increase Midodrine 10 mg TID -6/17 even after increase in midodrine patient orthostatic: Lying to sitting -6/17 Albumin 50 g - 6/17 TED hose knee-high -6/18 Albumin 50 g + normal saline 9m/hr -6/20 patient remains orthostatic with 12 point drop in SBP: Sitting to standing, however asymptomatic as BP now high enough to eliminate patient's symptoms   SVT - 6/19 patient has had multiple runs overnight of SVT (longest 25 beats).  Asymptomatic each time. - 6/19 given his orthostatic hypotension we are in a difficult position regarding starting a medication.  We will try low-dose Coreg 3.125 mg BID -6/19 spoke at length with patient and son and daughter-in-law concerning patient's multiple episodes of SVT.  Counseled that normally we would get cardiology involved, both patient and family stated they did not want any involvement by cardiology.  Would like to be discharged in the a.m. regardless of if low-dose beta-blocker worked or not.   Presumptive UTI, POA -Urine culture negative -DC antibiotics   Acute metabolic encephalopathy Unclear of  baseline mentation -A/O x4 at time of this exam. -Ammonia level slightly elevated =56 -Lactulose 10 g daily   Hepatocellular carcinoma/cirrhosis with ascites -No prior history of alcohol use or hepatitis disease or autoimmune disorder. -Follows with GI outpatient. -Poor prognosis -Palliative care team consult to assist with establishing goals of care.   History of esophagitis -Resume home PPI twice daily.   Chronic lower extremity edema -Trace edema in lower extremities bilaterally on exam. -He was on Lasix and p.o. spironolactone at home; will not discharge on these medications -Elevated BNP however no clear evidence of volume overload on exam. -Last 2D echo was on March 2022 revealing LVEF 50 to 55%   Chronic diastolic CHF -Euvolemic on exam - Strict in and out +4.7 L - Daily weight Filed Weights   12/02/20 2157 12/03/20 0538 12/03/20 0700  Weight: 68.8 kg 68.8 kg 69.9 kg   -Echocardiogram: Diastolic function could not be evaluated see below       Goals of care - 6/18 place face-to-face request for PT, OT, RN, wound care, social work       Discharge Diagnoses:  Active Problems:   Cirrhosis of liver with ascites (HCC)   Hepatocellular carcinoma (HCC)   Postural dizziness with presyncope   Orthostatic hypotension   Acute lower UTI   Acute metabolic encephalopathy   Chronic diastolic CHF (congestive heart failure) ([facility]   Discharge Condition: Guarded  Diet recommendation: Regular  Filed Weights   12/02/20 2157 12/03/20 0538 12/03/20 0700  Weight: 68.8 kg 68.8 kg 69.9 kg    History of present illness:  David WOLBERTSr. is a 87y.o. WM PMHx , TIA, HFpEF 50 to 55%, aortic stenosis,  MI 1988 and 1990, multiple gastric ulcers, severe esophagitis, hepatocellular carcinoma, liver cirrhosis with ascites with no prior history of alcohol abuse, hepatitis or autoimmune disorder, recent admission on April 2022 due to volume overload and orthostatic hypotension,  discharged from the hospital after a week of hospitalization went to rehab for 3 weeks then returned home where he lives alone, his son lives 15 minutes away.     Presents today due to recurrent falls and presyncopal episodes.  Associated with generalized weakness, the feeling that he is about to pass out when he stands and falls.  Reported loose stools intermittently.  No cardiopulmonary symptoms.  Denies fever chills or headaches.  Work-up in the ED revealed orthostatic hypotension.  Concern for dehydration with elevated creatinine, possible UTI.  EDP requested admission.  TRH, hospitalist team, was asked to admit.   ED Course:  Temperature 97.9.  BP 97/82, pulse 89, respiration 18, O2 saturation 92% on room air.  Lab studies remarkable for serum bicarb 26, glucose 131, BUN 23, creatinine 1.42, anion gap 11, GFR 47.  BNP 363, troponin 22 from 19.  WBC 11.3, hemoglobin 17.6, MCV 98, platelet 226.  Urine analysis WBC UA 11-20, leukocyte UA small.   Hospital Course:  See above  Procedures:  6/15 CXR:. Probable chronic interstitial disease at the bases with emphysema. There is a small left effusion 2. Convex right mediastinal opacity probably due to aortic tortuosity and augmented by rotated image. CT chest follow-up if deemed clinically appropriate 3. Possible fracture deformity of the distal right clavicle 6/16 Echocardiogram:Left Ventricle: LVEF= 60 to 65%. Left ventricular diastolic function could not be evaluated.   Right Ventricle: The right ventricular size is normal. No increase in  right ventricular wall thickness. Right ventricular systolic function is  normal. Tricuspid regurgitation signal is inadequate for assessing PA  pressure.  Consultations: Palliative care   Cultures  6/15 SARS coronavirus negative 6/15 influenza A/B negative 6/15 urine negative   Antibiotics Anti-infectives (From admission, onward)    Start     Ordered Stop   11/28/20 2230  cefTRIAXone  (ROCEPHIN) 1 g in sodium chloride 0.9 % 100 mL IVPB  Status:  Discontinued        11/28/20 2224 12/01/20 1933   11/28/20 2145  cephALEXin (KEFLEX) capsule 500 mg        11/28/20 2131 11/28/20 2142         Discharge Exam: Vitals:   12/03/20 0541 12/03/20 0607 12/03/20 0700 12/03/20 0922  BP: 102/70 102/71  126/90  Pulse: 73 80  83  Resp:  18  18  Temp:    98.1 F (36.7 C)  TempSrc:    Oral  SpO2:  91%  93%  Weight:   69.9 kg   Height:        General: A/O x4, No acute respiratory distress Eyes: negative scleral hemorrhage, negative anisocoria, negative icterus ENT: Negative Runny nose, negative gingival bleeding, Neck:  Negative scars, masses, torticollis, lymphadenopathy, JVD Lungs: Clear to auscultation bilaterally without wheezes or crackles Cardiovascular: Regular rate and rhythm without murmur gallop or rub normal S1 and S2 Abdomen: negative abdominal pain, nondistended, positive soft, bowel sounds, no rebound, no ascites, no appreciable mass  Discharge Instructions   Allergies as of 12/03/2020       Reactions   Atorvastatin Rash, Other (See Comments)   Patient started on Plavix and Lipitor at the same time and developed a rash   Brilinta [ticagrelor] Hives   Plavix [clopidogrel Bisulfate] Rash,  Other (See Comments)   Patient started on plavix and lipitor at the same time and developed a rash   Tape Other (See Comments)   SKIN IS VERY THIN- WILL TEAR EASILY!! PLEASE USE PAPER TAPE        Medication List     STOP taking these medications    furosemide 20 MG tablet Commonly known as: LASIX   spironolactone 50 MG tablet Commonly known as: ALDACTONE       TAKE these medications    carvedilol 3.125 MG tablet Commonly known as: COREG Take 1 tablet (3.125 mg total) by mouth 2 (two) times daily with a meal.   ICAPS AREDS FORMULA PO Take 1 capsule by mouth 2 (two) times daily.   lactulose 10 GM/15ML solution Commonly known as: CHRONULAC Take 15 mLs  (10 g total) by mouth daily. Start taking on: December 04, 2020   midodrine 10 MG tablet Commonly known as: PROAMATINE Take 1 tablet (10 mg total) by mouth 3 (three) times daily with meals. What changed:  medication strength how much to take when to take this   omeprazole 40 MG capsule Commonly known as: PRILOSEC Take 1 capsule (40 mg total) by mouth 2 (two) times daily before a meal.               Durable Medical Equipment  (From admission, onward)           Start     Ordered   12/01/20 1509  For home use only DME 3 n 1  Once        12/01/20 1508           Allergies  Allergen Reactions   Atorvastatin Rash and Other (See Comments)    Patient started on Plavix and Lipitor at the same time and developed a rash   Brilinta [Ticagrelor] Hives   Plavix [Clopidogrel Bisulfate] Rash and Other (See Comments)    Patient started on plavix and lipitor at the same time and developed a rash   Tape Other (See Comments)    SKIN IS VERY THIN- WILL TEAR EASILY!! [place] [place] Follow up.   Specialty: [place] Why: the office will call to schedule home health visits within 48 hours of discharge Contact information: [place] [id number] [phone]                  The results of significant diagnostics from this hospitalization (including imaging, microbiology, ancillary and laboratory) are listed below for reference.    Significant Diagnostic Studies: DG Chest 2 View  Result Date: 11/28/2020 CLINICAL DATA:  Syncope EXAM: CHEST - 2 VIEW COMPARISON:  09/13/2020, 06/06/2020 FINDINGS: Mild reticular opacity at the bases likely due to chronic disease. Emphysema. Trace left-sided pleural effusion. Convex opacity at the right mid to lower mediastinal silhouette potentially due to aortic tortuosity augmented by rotated image. No pleural effusion or pneumothorax. There is  possible right clavicle fracture deformity. IMPRESSION: 1. Probable chronic interstitial disease at the bases with emphysema. There is a small left effusion 2. Convex right mediastinal opacity probably due to aortic tortuosity and augmented by rotated image. CT chest follow-up if deemed clinically appropriate 3. Possible fracture deformity of the distal right clavicle Electronically Signed   By: Donavan Foil M.D.   On: 11/28/2020 17:11   DG Elbow Complete Right  Result Date: 11/28/2020 CLINICAL DATA:  Syncope EXAM: RIGHT ELBOW - COMPLETE 3+ VIEW COMPARISON:  None. FINDINGS: No definitive fracture or malalignment. Posterior elbow soft tissue swelling. Possible small elbow effusion IMPRESSION: 1. No definite acute osseous abnormality. There is possible small elbow effusion, consider short interval radiographic follow-up to exclude occult fracture Electronically Signed   By: Donavan Foil M.D.   On: 11/28/2020 17:12   DG Forearm Right  Result Date: 11/28/2020 CLINICAL DATA:  Syncopal episodes multiple fall EXAM: RIGHT FOREARM - 2 VIEW COMPARISON:  None. FINDINGS: There is no evidence of fracture or other focal bone lesions. Soft tissues are unremarkable. IMPRESSION: Negative. Electronically Signed   By: Donavan Foil M.D.   On: 11/28/2020 17:13   CT Head Wo Contrast  Result Date: 11/28/2020 CLINICAL DATA:  Head trauma.  Multiple syncopal episodes. EXAM: CT HEAD WITHOUT CONTRAST TECHNIQUE: Contiguous axial images were obtained from the base of the skull through the vertex without intravenous contrast. COMPARISON:  None. FINDINGS: Brain: No evidence of acute infarction, hemorrhage, hydrocephalus, extra-axial collection or mass lesion/mass effect. Marked parenchymal volume loss and deep white matter microangiopathy. Stable encephalomalacia of the right parietal lobe, likely from prior ischemic infarction. Vascular: No hyperdense vessel or unexpected calcification. Skull: Calcific atherosclerotic disease of  the intra cavernous carotid arteries. Sinuses/Orbits: No acute finding. Other: None. IMPRESSION: 1. No acute intracranial abnormality. 2. Atrophy, chronic microvascular disease. 3. Stable encephalomalacia of the right parietal lobe, likely from prior ischemic infarction. Electronically Signed   By: Fidela Salisbury M.D.   On: 11/28/2020 17:18   CT Cervical Spine Wo Contrast  Result Date: 11/28/2020 CLINICAL DATA:  Post fall from multiple syncopal episodes. EXAM: CT CERVICAL SPINE WITHOUT CONTRAST TECHNIQUE: Multidetector CT imaging of the cervical spine was performed without intravenous contrast. Multiplanar CT image reconstructions were also generated. COMPARISON:  None. FINDINGS: Alignment: Anterolisthesis of C2 on C3 posterior listhesis of C5 on C6. Skull base and vertebrae: No acute fracture. No primary bone lesion or focal pathologic process. Soft tissues and spinal canal: No prevertebral fluid or swelling. No visible canal hematoma. Disc levels: Multilevel osteoarthritic changes with disc space narrowing, remodeling of vertebral bodies, osteophytosis and posterior facet arthropathy. Upper chest: Negative. Other: None. IMPRESSION: 1. No evidence of acute traumatic injury to the cervical spine. 2. Anterolisthesis of C2 on C3 and posterior listhesis of C5 on C6, likely degenerative. 3. Multilevel osteoarthritic changes of the cervical spine. Electronically Signed   By: Fidela Salisbury M.D.   On: 11/28/2020 17:22   DG Hand Complete Right  Result Date: 11/28/2020 CLINICAL DATA:  Syncopal episodes with multiple fall EXAM: RIGHT HAND - COMPLETE 3+ VIEW COMPARISON:  None. FINDINGS: No fracture or malalignment. Scattered degenerative change within the digits of the right hand. Moderate arthritis at the STT interval. IMPRESSION: No acute osseous abnormality Electronically Signed   By: Donavan Foil M.D.   On: 11/28/2020 17:14   ECHOCARDIOGRAM COMPLETE  Result Date: 11/29/2020    ECHOCARDIOGRAM REPORT    Patient Name:   David DULLEA Sr. Date of Exam: 11/29/2020 Medical Rec #:  [id number]           Height:       60.0 in Accession #:    [id number]          Weight:       140.4 lb Date of Birth:  02/07/1927          BSA:          1.606 m Patient Age:  87 years            BP:           92/61 mmHg Patient Gender: M                   HR:           56 bpm. Exam Location:  Inpatient Procedure: 2D Echo, Color Doppler and Cardiac Doppler Indications:    R55 Syncope  History:        Patient has prior history of Echocardiogram examinations, most                 recent 08/27/2020. Risk Factors:Dyslipidemia.  Sonographer:    Raquel Sarna Senior RDCS Referring Phys: [id number] [place]  1. Left ventricular ejection fraction, by estimation, is 60 to 65%. The left ventricle has normal function. Left ventricular endocardial border not optimally defined to evaluate regional wall motion. Left ventricular diastolic function could not be evaluated.  2. Right ventricular systolic function is normal. The right ventricular size is normal. Tricuspid regurgitation signal is inadequate for assessing PA pressure.  3. The mitral valve is normal in structure. Trivial mitral valve regurgitation. No evidence of mitral stenosis.  4. The AV is poorly visualized. There appears to be some degree of aortic stenosis possibly mild to moderate by doppler but cannot estimate with accuracy. Consider TEE for repeat limited echo of the AV to try to acquire clearer images. Aortic valve regurgitation is trivial. Aortic valve mean gradient measures 22.0 mmHg. Aortic valve Vmax measures 2.97 m/s.  5. The inferior vena cava is normal in size with greater than 50% respiratory variability, suggesting right atrial pressure of 3 mmHg.  6. There is a prominent epicardial fat pad and trivial pericardial effusion anterior to the right ventricle. FINDINGS  Left Ventricle: Left ventricular ejection fraction, by estimation, is 60 to 65%. The left ventricle has  normal function. Left ventricular endocardial border not optimally defined to evaluate regional wall motion. The left ventricular internal cavity size was normal in size. There is no left ventricular hypertrophy. Left ventricular diastolic function could not be evaluated. Right Ventricle: The right ventricular size is normal. No increase in right ventricular wall thickness. Right ventricular systolic function is normal. Tricuspid regurgitation signal is inadequate for assessing PA pressure. Left Atrium: Left atrial size was normal in size. Right Atrium: Right atrial size was normal in size. Pericardium: Trivial pericardial effusion is present. The pericardial effusion is anterior to the right ventricle. Presence of pericardial fat pad. Mitral Valve: The mitral valve is normal in structure. Mild to moderate mitral annular calcification. Trivial mitral valve regurgitation. No evidence of mitral valve stenosis. Tricuspid Valve: The tricuspid valve is normal in structure. Tricuspid valve regurgitation is not demonstrated. No evidence of tricuspid stenosis. Aortic Valve: The AV is poorly visualized. There appears to be some degree of aortic stenosis by doppler but cannot estimate with accuracy. Consider TEE for repeat limited echo of the AV to try to acquire clearer images. The aortic valve was not well visualized. Aortic valve regurgitation is trivial. Mild to moderate aortic stenosis is present. Aortic valve mean gradient measures 22.0 mmHg. Aortic valve peak gradient measures 35.3 mmHg. Aortic valve area, by VTI measures 0.54 cm. Pulmonic Valve: The pulmonic valve was not well visualized. Pulmonic valve regurgitation is trivial. No evidence of pulmonic stenosis. Aorta: The aortic root is normal in size and structure. Venous: The inferior vena cava is normal in size with greater than  50% respiratory variability, suggesting right atrial pressure of 3 mmHg. IAS/Shunts: The interatrial septum appears to be lipomatous. No  atrial level shunt detected by color flow Doppler.  LEFT VENTRICLE PLAX 2D LVIDd:         4.80 cm  Diastology LVIDs:         3.10 cm  LV e' medial:  4.68 cm/s LV PW:         1.00 cm  LV e' lateral: 4.90 cm/s LV IVS:        1.10 cm LVOT diam:     1.90 cm LV SV:         42 LV SV Index:   26 LVOT Area:     2.84 cm  RIGHT VENTRICLE RV S prime:     7.83 cm/s TAPSE (M-mode): 1.8 cm LEFT ATRIUM             Index       RIGHT ATRIUM           Index LA Biplane Vol: 67.0 ml 41.71 ml/m RA Area:     15.10 cm                                     RA Volume:   37.50 ml  23.35 ml/m  AORTIC VALVE AV Area (Vmax):    0.64 cm AV Area (Vmean):   0.61 cm AV Area (VTI):     0.54 cm AV Vmax:           297.00 cm/s AV Vmean:          228.000 cm/s AV VTI:            0.763 m AV Peak Grad:      35.3 mmHg AV Mean Grad:      22.0 mmHg LVOT Vmax:         67.20 cm/s LVOT Vmean:        48.950 cm/s LVOT VTI:          0.147 m LVOT/AV VTI ratio: 0.19  AORTA Ao Root diam: 3.40 cm  SHUNTS Systemic VTI:  0.15 m Systemic Diam: 1.90 cm Fransico Him MD Electronically signed by Fransico Him MD Signature Date/Time: 11/29/2020/3:14:04 PM    Final     Microbiology: Recent Results (from the past 240 hour(s))  Resp Panel by RT-PCR (Flu A&B, Covid) Nasopharyngeal Swab     Status: None   Collection Time: 11/28/20  7:19 PM   Specimen: Nasopharyngeal Swab; Nasopharyngeal(NP) swabs in vial transport medium  Result Value Ref Range Status   SARS Coronavirus 2 by RT PCR NEGATIVE NEGATIVE Final    Comment: (NOTE) SARS-CoV-2 target nucleic acids are NOT DETECTED.  The SARS-CoV-2 RNA is generally detectable in upper respiratory specimens during the acute phase of infection. The lowest concentration of SARS-CoV-2 viral copies this assay can detect is 138 copies/mL. A negative result does not preclude SARS-Cov-2 infection and should not be used as the sole basis for treatment or other patient management decisions. A negative result may occur with   improper specimen collection/handling, submission of specimen other than nasopharyngeal swab, presence of viral mutation(s) within the areas targeted by this assay, and inadequate number of viral copies(<138 copies/mL). A negative result must be combined with clinical observations, patient history, and epidemiological information. The expected result is Negative.  Fact Sheet for Patients:  EntrepreneurPulse.com.au  Fact Sheet for Healthcare Providers:  IncredibleEmployment.be  This test is no t yet approved or cleared by the [place] and  has been authorized for detection and/or diagnosis of SARS-CoV-2 by FDA under an Emergency Use Authorization (EUA). This EUA will remain  in effect (meaning this test can be used) for the duration of the COVID-19 declaration under Section 564(b)(1) of the Act, 21 U.S.C.section 360bbb-3(b)(1), unless the authorization is terminated  or revoked sooner.       Influenza A by PCR NEGATIVE NEGATIVE Final   Influenza B by PCR NEGATIVE NEGATIVE Final    Comment: (NOTE) The Xpert Xpress SARS-CoV-2/FLU/RSV plus assay is intended as an aid in the diagnosis of influenza from Nasopharyngeal swab specimens and should not be used as a sole basis for treatment. Nasal washings and aspirates are unacceptable for Xpert Xpress SARS-CoV-2/FLU/RSV testing.  Fact Sheet for Patients: EntrepreneurPulse.com.au  Fact Sheet for Healthcare Providers: IncredibleEmployment.be  This test is not yet approved or cleared by the [place] FDA and has been authorized for detection and/or diagnosis of SARS-CoV-2 by FDA under an Emergency Use Authorization (EUA). This EUA will remain in effect (meaning this test can be used) for the duration of the COVID-19 declaration under Section 564(b)(1) of the Act, 21 U.S.C. section 360bbb-3(b)(1), unless the authorization is terminated  or revoked.  Performed at [facility], [place] [address], [place], [place] [address]   Urine Culture     Status: None   Collection Time: 11/28/20  9:32 PM   Specimen: Urine, Random  Result Value Ref Range Status   Specimen Description URINE, RANDOM  Final   Special Requests NONE  Final   Culture   Final    NO GROWTH Performed at [facility], [place] [address] [place], [place] [address]    Report Status 11/30/2020 FINAL  Final     Labs: Basic Metabolic Panel: Recent Labs  Lab 11/29/20 0344 11/30/20 0234 12/01/20 0328 12/02/20 0249 12/03/20 0741  NA 140 139 139 138 139  K 4.1 4.1 3.7 3.9 4.4  CL 109 111 109 111 109  CO2 23 22 24 22 22   GLUCOSE 103* 96 98 109* 139*  BUN 20 17 15 15 12   CREATININE 1.18 1.12 1.19 1.06 1.01  CALCIUM 8.1* 8.2* 8.3* 8.3* 8.5*  MG 1.9 1.8 2.5* 2.2 2.0  PHOS 3.1 2.8 2.8 2.5 2.1*   Liver Function Tests: Recent Labs  Lab 11/28/20 1632 11/30/20 0234 12/01/20 0328 12/02/20 0249 12/03/20 0741  AST 58* 37 32 38 50*  ALT 44 31 25 28  36  ALKPHOS 324* 239* 190* 199* 235*  BILITOT 0.6 1.1 1.0 0.9 1.0  PROT 7.4 5.4* 5.4* 5.6* 5.6*  ALBUMIN 3.0* 2.2* 2.8* 3.3* 2.8*   No results for input(s): LIPASE, AMYLASE in the last 168 hours. Recent Labs  Lab 11/29/20 0344 12/01/20 0328 12/02/20 0249 12/03/20 0741  AMMONIA 56* 33 34 37*   CBC: Recent Labs  Lab 11/28/20 1632 11/29/20 0344 11/30/20 0234 12/01/20 0328 12/02/20 0249  WBC 11.3* 7.6 7.9 6.7 6.2  NEUTROABS 6.6  --   --   --   --   HGB 17.6* 15.9 14.3 13.1 12.2*  HCT 52.6* 46.9 43.2 39.4 37.1*  MCV 98.3 97.5 98.2 98.0 98.4  PLT 226 166 170 160 152   Cardiac Enzymes: No results for input(s): CKTOTAL, CKMB, CKMBINDEX, TROPONINI in the last 168 hours. BNP: BNP (last 3 results) Recent Labs    09/13/20 1323 11/28/20 1632  BNP 207.3*  363.1*    ProBNP (last 3 results) Recent Labs    07/23/20 1023 08/02/20 1202  PROBNP 427.0* 354.0*    CBG: No results for  input(s): GLUCAP in the last 168 hours.     Signed:  Dia Crawford, MD [facility]

## 2020-12-04 ENCOUNTER — Ambulatory Visit: Payer: Medicare HMO | Admitting: Primary Care

## 2020-12-06 ENCOUNTER — Telehealth: Payer: Self-pay | Admitting: Primary Care

## 2020-12-06 NOTE — Telephone Encounter (Signed)
Home Health verbal orders-caller/Agency:  [place] number:  [phone]  Requesting OT/PT/Skilled nursing/Social Work/Speech: hospice care  Reason: decline in health  Frequency: everyday    Also wanted to know if Anda Kraft would be the attending pcp while in hospice care.

## 2020-12-07 NOTE — Telephone Encounter (Signed)
Called and verbal information given to Alwyn Ren no further action needed at this time.

## 2020-12-07 NOTE — Telephone Encounter (Signed)
Yes, I agree 

## 2020-12-10 ENCOUNTER — Telehealth: Payer: Self-pay | Admitting: *Deleted

## 2020-12-10 NOTE — Telephone Encounter (Signed)
Almyra Free the Hospice Nurse left a voicemail stating that patient is a newly admitted patient to them. Almyra Free stated that Allie Bossier NP is patient's PCP. Almyra Free stated that patient needs a refill on his omeprazole so she was calling the order in for him. Almyra Free stated that patient is also experiencing some urinary frequency especially at night. Almyra Free stated that patient is waking up a lot during the night to urinate. Almyra Free stated that the patient woke up 5 times last night to urinate and some times not able to go. Almyra Free stated that patient had a UTI while in the hospital and was treated for it. Almyra Free stated that she is wondering if patient may be having bladder spasms. Almyra Free stated if Anda Kraft is okay with them using their standing orders they can prescribe patient Oxybutynin for bladder spasms. Almyra Free requested a call back regarding the patient.

## 2020-12-11 NOTE — Telephone Encounter (Signed)
Please notify Almyra Free:  Dr. Havery Moros ordered 6 months of omeprazole in April 2022 so he should have plenty of refills.  Okay to try the oxybutynin but will need to caution the patient that his will cause dry mouth.

## 2020-12-12 NOTE — Telephone Encounter (Signed)
David Bradshaw that urinalysis and culture are needed. She will get a urine sample tomorrow and send to [facility] for testing. Her number is [phone] if any further questions.

## 2020-12-12 NOTE — Telephone Encounter (Signed)
Almyra Free left another message stating since patient is having frequent urination should she get a urine specimen. Almyra Free stated that they can prescribe medication for bladder spasms. Almyra Free stated that patient is a fall risk and will not use a urinal so she is concerned with him getting up so many times at night.

## 2020-12-12 NOTE — Telephone Encounter (Signed)
Yes, needs urinalysis and urine culture.

## 2020-12-12 NOTE — Telephone Encounter (Signed)
Left message to return call to our office.  

## 2020-12-18 ENCOUNTER — Other Ambulatory Visit: Payer: Self-pay

## 2020-12-18 ENCOUNTER — Telehealth: Payer: Self-pay

## 2020-12-18 ENCOUNTER — Ambulatory Visit: Payer: Medicare HMO | Admitting: Primary Care

## 2020-12-18 DIAGNOSIS — N3000 Acute cystitis without hematuria: Secondary | ICD-10-CM

## 2020-12-18 MED ORDER — SULFAMETHOXAZOLE-TRIMETHOPRIM 800-160 MG PO TABS: 1.0000 | ORAL_TABLET | Freq: Two times a day (BID) | ORAL | 0 refills | Status: DC

## 2020-12-18 NOTE — Telephone Encounter (Signed)
Called and spoke to Boyd. Let know all information. She as requested that script be sent in to [facility] he is staying with son. I have called original pharmacy and cancelled. Sent in script to new pharmacy.    She wanted to let you now that son mentioned that he had some loose stools around the same time that UTI symptoms started.

## 2020-12-18 NOTE — Telephone Encounter (Signed)
Waiting on urine culture, we need to make sure this gets faxed over once resulted.  I did receive the UA results.

## 2020-12-18 NOTE — Telephone Encounter (Signed)
Received call from Haliimaile on triage voice mail. States that u/a results were faxed over. She will be going out to patients home later today and wanted to know if any treatment changes were needed. Have you received results?

## 2020-12-18 NOTE — Telephone Encounter (Signed)
Received preliminary urine culture results which were positive for E coli infection. No sensitivity report back as of yet. Will go ahead and treat. No allergies on file.  Start Bactrim DS (sulfamethoxazole/trimethoprim) tablets for urinary tract infection. Take 1 tablet by mouth twice daily for 5 days.  Will send to pharmacy.

## 2020-12-21 MED ORDER — NITROFURANTOIN MONOHYD MACRO 100 MG PO CAPS: 100.0000 mg | ORAL_CAPSULE | Freq: Two times a day (BID) | ORAL | 0 refills | Status: AC

## 2020-12-21 NOTE — Telephone Encounter (Signed)
Received call from Almyra Free states that culture showed resistant to bactrim. Wanted to know if you wanted to make change

## 2020-12-21 NOTE — Addendum Note (Signed)
Addended by: Pleas Koch on: 12/21/2020 04:43 PM   Modules accepted: Orders

## 2020-12-21 NOTE — Telephone Encounter (Signed)
Culture report reviewed, and it looks like there is multiple resistance including Bactrim.  Will switch to nitrofurantoin per sensitivity report.  Take 1 capsule by mouth twice daily for 7 days. [facility]

## 2020-12-21 NOTE — Telephone Encounter (Signed)
Called gave information to Almyra Free she will let patient know. No further action

## 2020-12-21 NOTE — Telephone Encounter (Signed)
Returning phone call from joellen  

## 2020-12-31 ENCOUNTER — Other Ambulatory Visit: Payer: Self-pay | Admitting: Gastroenterology

## 2020-12-31 DIAGNOSIS — R600 Localized edema: Secondary | ICD-10-CM

## 2020-12-31 NOTE — Telephone Encounter (Signed)
Patient under hospice care

## 2021-01-17 ENCOUNTER — Other Ambulatory Visit: Payer: Self-pay | Admitting: Gastroenterology

## 2021-01-17 DIAGNOSIS — R600 Localized edema: Secondary | ICD-10-CM

## 2021-01-25 ENCOUNTER — Ambulatory Visit: Payer: Medicare HMO | Admitting: Gastroenterology

## 2021-03-06 ENCOUNTER — Telehealth: Payer: Self-pay

## 2021-03-06 NOTE — Telephone Encounter (Signed)
-----   Message from Roetta Sessions, [place] sent at 10/15/2020  4:26 PM EDT ----- Regarding: 3rd twinrix Patient will be due for 3rd and final Twinrix on 03-18-21

## 2021-03-06 NOTE — Telephone Encounter (Signed)
Called and LM for patient to call back to schedule 3rd and final Twinrix  (Armbruster) on or after 03-18-21.

## 2021-03-08 ENCOUNTER — Other Ambulatory Visit: Payer: Self-pay

## 2021-03-08 NOTE — Progress Notes (Signed)
Patient is due for 3rd and final Twinrix but we can no longer get Twinrix. Twinrix order deleted and Order entered for individual Hep A and Hep B.

## 2021-03-08 NOTE — Telephone Encounter (Signed)
In 6 months from the first dose of Twinrix he will need a Hep A and a Hep B (2 pediatric doses or 1 Heplisav)

## 2021-03-08 NOTE — Telephone Encounter (Signed)
Called son to schedule patient and was informed that patient is now in hospice.

## 2021-03-08 NOTE — Telephone Encounter (Signed)
Patient has had 2 Twinrix injections, 60 days apart.  Note: patient was hospitalized when he should have had 2nd injection 30 days from 1st. Please advise if patient will need another Hep A and/or Hep B and when. (Another Hep A 6 months from his initial Twinrix? Does he need another Hep B since he has already had two?) Thank you

## 2021-04-15 ENCOUNTER — Other Ambulatory Visit (HOSPITAL_COMMUNITY): Payer: Self-pay

## 2021-06-30 ENCOUNTER — Emergency Department (HOSPITAL_COMMUNITY)
Admission: EM | Admit: 2021-06-30 | Discharge: 2021-06-30 | Disposition: A | Discharge status: Home / Self Care | Payer: Medicare HMO | Attending: Emergency Medicine | Admitting: Emergency Medicine

## 2021-06-30 ENCOUNTER — Encounter (HOSPITAL_COMMUNITY): Payer: Self-pay

## 2021-06-30 ENCOUNTER — Other Ambulatory Visit: Payer: Self-pay

## 2021-06-30 DIAGNOSIS — I10 Essential (primary) hypertension: Secondary | ICD-10-CM | POA: Diagnosis not present

## 2021-06-30 DIAGNOSIS — Z8616 Personal history of COVID-19: Secondary | ICD-10-CM | POA: Insufficient documentation

## 2021-06-30 DIAGNOSIS — Z743 Need for continuous supervision: Secondary | ICD-10-CM | POA: Diagnosis not present

## 2021-06-30 DIAGNOSIS — W19XXXA Unspecified fall, initial encounter: Secondary | ICD-10-CM

## 2021-06-30 DIAGNOSIS — S4992XA Unspecified injury of left shoulder and upper arm, initial encounter: Secondary | ICD-10-CM | POA: Diagnosis not present

## 2021-06-30 DIAGNOSIS — W1789XA Other fall from one level to another, initial encounter: Secondary | ICD-10-CM | POA: Diagnosis not present

## 2021-06-30 DIAGNOSIS — R0689 Other abnormalities of breathing: Secondary | ICD-10-CM | POA: Diagnosis not present

## 2021-06-30 DIAGNOSIS — S51012A Laceration without foreign body of left elbow, initial encounter: Secondary | ICD-10-CM | POA: Diagnosis not present

## 2021-06-30 DIAGNOSIS — R0902 Hypoxemia: Secondary | ICD-10-CM | POA: Diagnosis not present

## 2021-06-30 MED ORDER — BACITRACIN ZINC 500 UNIT/GM EX OINT: TOPICAL_OINTMENT | Freq: Two times a day (BID) | CUTANEOUS | Status: DC

## 2021-06-30 NOTE — ED Notes (Signed)
Discharge instructions reviewed with patient and family member. Patient and family member verbalized understanding of instructions. Follow-up care and medications were reviewed. Wheeled out of ED in wheelchair. VSS upon discharge.

## 2021-06-30 NOTE — Discharge Instructions (Signed)
Keep the wound covered at home.  Apply bacitracin ointment twice daily and change dressing twice daily.  Return to the ER if you are concerned for signs of infection such as increasing pain increasing redness increasing warmth fevers or any additional concerns.

## 2021-06-30 NOTE — ED Provider Notes (Addendum)
[facility] EMERGENCY DEPARTMENT Provider Note   CSN: [id number] Arrival date & time: 06/30/21  1622     History  Chief Complaint  Patient presents with   David Haines Sr. is a 88 y.o. male.  Patient on hospice care, presents with acute fall today.  States he lost his balance and fell and scraped his left upper extremity at home.  They noticed a skin tear and is brought to the ER for further evaluation.  Denies loss of consciousness.  Denies headache neck pain or back pain or other extremity pain.  Patient recently tested positive for COVID and has been using supplemental oxygen 4 to 5 L at home.  No reports of any vomiting or diarrhea.      Home Medications Prior to Admission medications   Medication Sig Start Date End Date Taking? Authorizing Provider  carvedilol (COREG) 3.125 MG tablet Take 1 tablet (3.125 mg total) by mouth 2 (two) times daily with a meal. 12/03/20   Allie Bossier, MD  lactulose, encephalopathy, (CHRONULAC) 10 GM/15ML SOLN Take 15 mLs (10 g total) by mouth daily. 12/04/20   Allie Bossier, MD  midodrine (PROAMATINE) 10 MG tablet Take 1 tablet (10 mg total) by mouth 3 (three) times daily with meals. 12/03/20   Allie Bossier, MD  Multiple Vitamins-Minerals (ICAPS AREDS FORMULA PO) Take 1 capsule by mouth 2 (two) times daily.    [provider]  omeprazole (PRILOSEC) 40 MG capsule Take 1 capsule (40 mg total) by mouth 2 (two) times daily before a meal. 10/05/20   Armbruster, Carlota Raspberry, MD      Allergies    Atorvastatin, Brilinta [ticagrelor], Plavix [clopidogrel bisulfate], and Tape    Review of Systems   Review of Systems  Constitutional:  Negative for fever.  HENT:  Negative for ear pain and sore throat.   Eyes:  Negative for pain.  Respiratory:  Negative for cough.   Cardiovascular:  Negative for chest pain.  Gastrointestinal:  Negative for abdominal pain.  Genitourinary:  Negative for flank pain.  Musculoskeletal:   Negative for back pain.  Skin:  Negative for color change and rash.  Neurological:  Negative for syncope.  All other systems reviewed and are negative.  Physical Exam Updated Vital Signs BP (!) 111/57 (BP Location: Right Arm)    Pulse 78    Resp 18    Ht 5' (1.524 m)    Wt 69.9 kg    SpO2 93%    BMI 30.10 kg/m  Physical Exam Constitutional:      Appearance: He is well-developed.  HENT:     Head: Normocephalic and atraumatic.     Comments: No evidence of head trauma on exam.    Nose: Nose normal.  Eyes:     Extraocular Movements: Extraocular movements intact.  Cardiovascular:     Rate and Rhythm: Normal rate.  Pulmonary:     Effort: Pulmonary effort is normal.  Musculoskeletal:     Comments: Patient has no pain with range of motion of the neck with her torso bilateral upper extremities shoulders elbows wrists or knees hips and ankles.  No C or T or L-spine midline step-offs or tenderness noted.  Skin:    Coloration: Skin is not jaundiced.     Comments: Skin tears in the left upper extremity total size of approximately 6 x 4 cm.  These are not amenable to suture repair.  Neurological:  Mental Status: He is alert. Mental status is at baseline.    ED Results / Procedures / Treatments   Labs (all labs ordered are listed, but only abnormal results are displayed) Labs Reviewed - No data to display  EKG None  Radiology No results found.  Procedures Procedures    Medications Ordered in ED Medications  bacitracin ointment (has no administration in time range)    ED Course/ Medical Decision Making/ A&P                           Medical Decision Making  Family states that patient is on hospice care.  They are concerned about the skin tears today.  Patient otherwise in no acute distress in no pain and appears comfortable.  Bacitracin ointment placed and dressing placed over the skin tears.  Advise continued outpatient follow-up with their hospice team in the next 2  to 3 days.  Advising return if his hospice needs are not being met or if they have any additional concerns.         Final Clinical Impression(s) / ED Diagnoses Final diagnoses:  Fall, initial encounter  Skin tear of left elbow without complication, initial encounter    Rx / DC Orders ED Discharge Orders     None         Luna Fuse, MD 06/30/21 1836    Luna Fuse, MD 06/30/21 1836

## 2021-06-30 NOTE — ED Triage Notes (Signed)
Pt BIB EMS after having a fall 2 days ago, resulting in a skin tear on his left arm. Pt and family denies LOC and is not on a blood thinner. Pt is in hospice care and has recently tested positive for COVID. Upon EMS arrival, pt was slightly hypoxic, he rose to 96% w/ 6L on [place]  VSS w/ EMS.

## 2021-07-02 ENCOUNTER — Telehealth: Payer: Self-pay | Admitting: Primary Care

## 2021-07-02 NOTE — Telephone Encounter (Signed)
Patient needs follow up office visit for February or March to continue signing his hospice papers. Okay to wait until we are back in the office at [facility]. Please schedule.

## 2021-07-02 NOTE — Telephone Encounter (Signed)
Called talked son states that they are trying to get placed in hospice home. He is not able to get in office. Can we do follow up virtual.

## 2021-07-02 NOTE — Telephone Encounter (Signed)
Please call son to set up virtual follow up in march

## 2021-07-02 NOTE — Telephone Encounter (Signed)
Yes, okay to complete virtual visit. He must still be on palliative care.

## 2021-07-03 NOTE — Telephone Encounter (Signed)
Called Mr. [name] to get Mr. Treysean scheduled and he stated that the hospice nurse told him that Mr. Merritt is transitioning now.

## 2021-07-03 NOTE — Telephone Encounter (Signed)
Pt's son returned call and states that pt has been started on morphine to keep him comfortable during his end of life care. Son states that pt will likely not make it to the weekend. Our sympathies were expressed and son was appreciative.

## 2021-07-03 NOTE — Telephone Encounter (Signed)
Not sure what this means. Is he ok?  Would like to see him virtually soon at his convenience.

## 2021-07-03 NOTE — Telephone Encounter (Signed)
Left VM for pt's son, Kemar Pandit, asking for a call back to clarify pt's condition.

## 2021-07-04 ENCOUNTER — Telehealth: Payer: Self-pay | Admitting: Primary Care

## 2021-07-08 NOTE — Telephone Encounter (Signed)
Butch Penny with [place] called stating that Carlis Abbott filled out everything concerning pt death but she didn't sign it. Please advise.

## 2021-07-09 NOTE — Telephone Encounter (Signed)
Called David Bradshaw they have received no further action needed.

## 2021-07-09 NOTE — Telephone Encounter (Signed)
I had a difficult time getting the certificate to certify on [place]. I believe it's all complete now.

## 2021-07-17 NOTE — Telephone Encounter (Signed)
Mr. [name] called and wanted to let Anda Kraft know that Mr. David Bradshaw passed this morning

## 2021-07-17 NOTE — Telephone Encounter (Signed)
Spoke with patient's son, [name].  Condolences provided.

## 2021-07-17 DEATH — deceased

## 2022-09-01 IMAGING — DX DG CHEST 2V
2 series · 2 of 2 positions shown · non-contrast
Comparison: 01/31/2016 and prior.

CLINICAL DATA: history of tobacco abuse

EXAM:
CHEST - 2 VIEW

[chest pa]
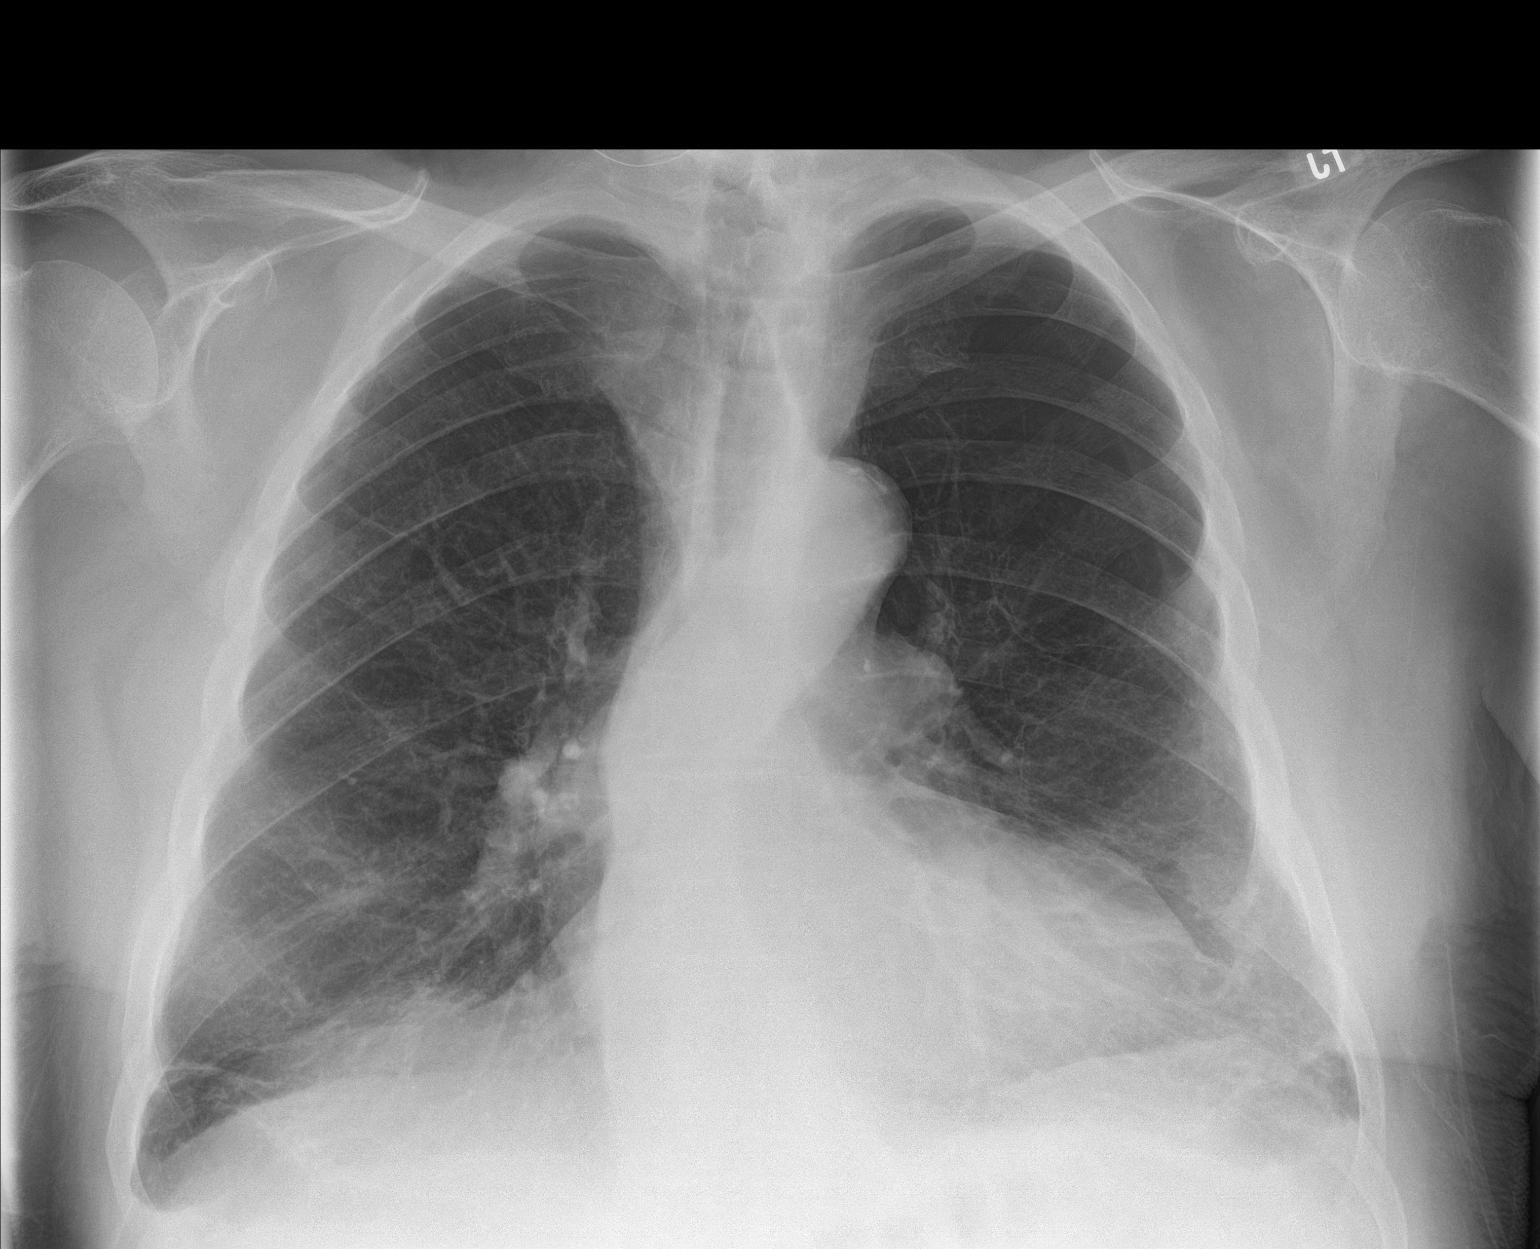

[chest lat]
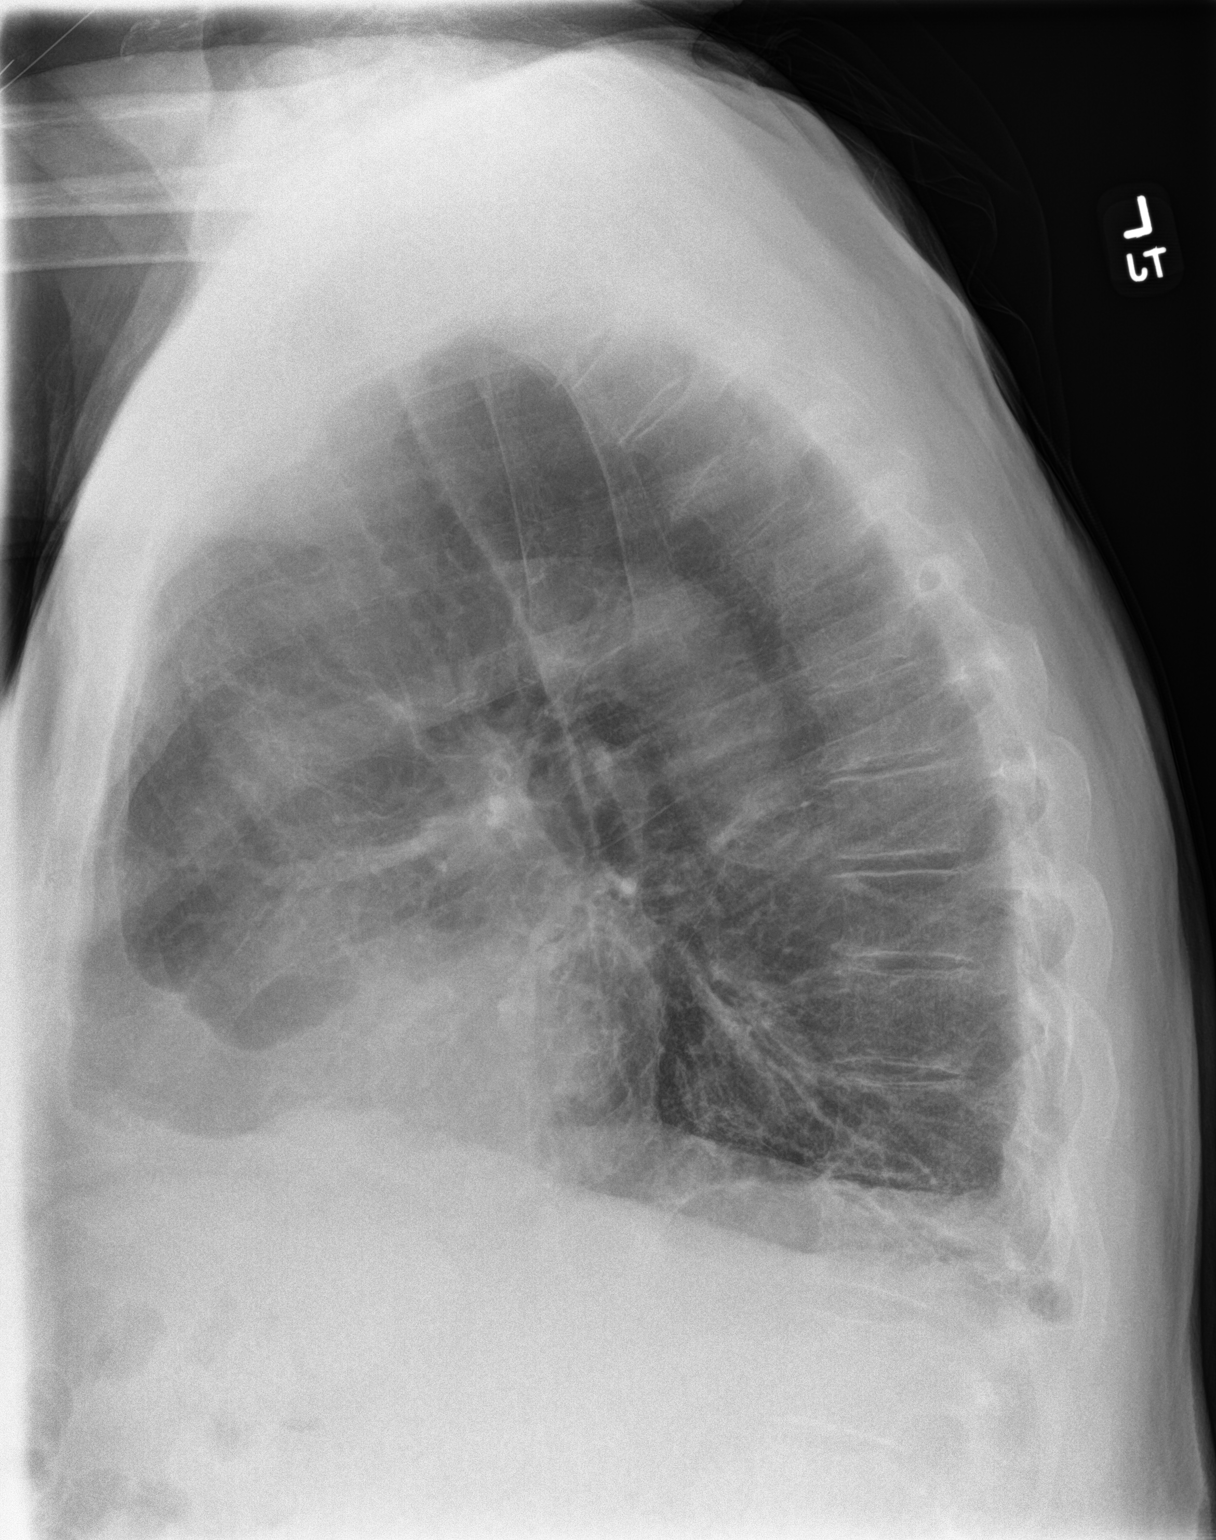

[2 of 2 positions shown; findings below may reference images not displayed]

FINDINGS: Patchy bibasilar opacities and small bilateral pleural effusions. No
pneumothorax. Cardiomediastinal silhouette within normal limits. No
acute osseous abnormality.
IMPRESSION: Patchy bibasilar opacities, infection versus atelectasis.

Small bilateral effusions.

## 2022-10-12 IMAGING — RF DG ERCP WO/W SPHINCTEROTOMY
1 series · 15 of 15 positions shown · non-contrast
Comparison: MRCP 07/05/2020

CLINICAL DATA: [AGE] male undergoing ERCP and stone removal

EXAM:
ERCP
TECHNIQUE: Multiple spot images obtained with the fluoroscopic device and
submitted for interpretation post-procedure.
FLUOROSCOPY TIME:  Fluoroscopy Time:  1 minutes 35 seconds
Number of Acquired Spot Images: 0

[Series 1: run · 12 acquisitions, 15 frames shown]
[im 1/12]
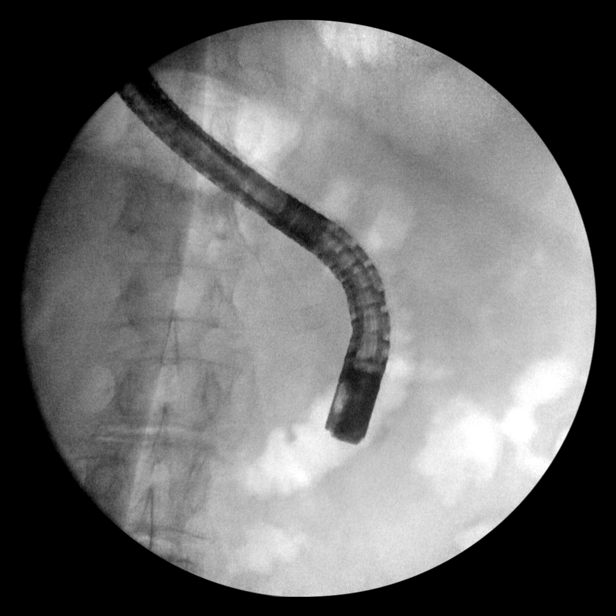
[im 2/12]
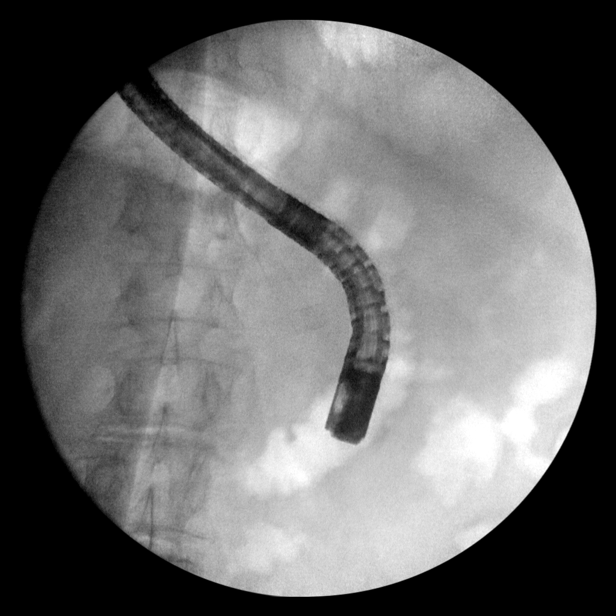
[im 3/12]
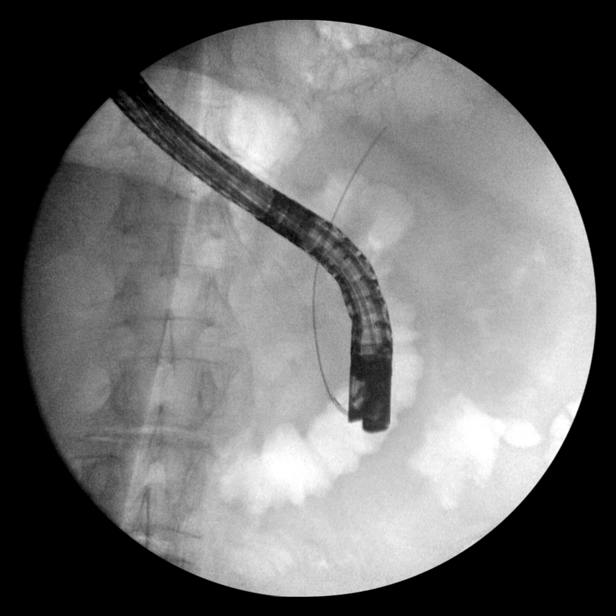
[im 4/12]
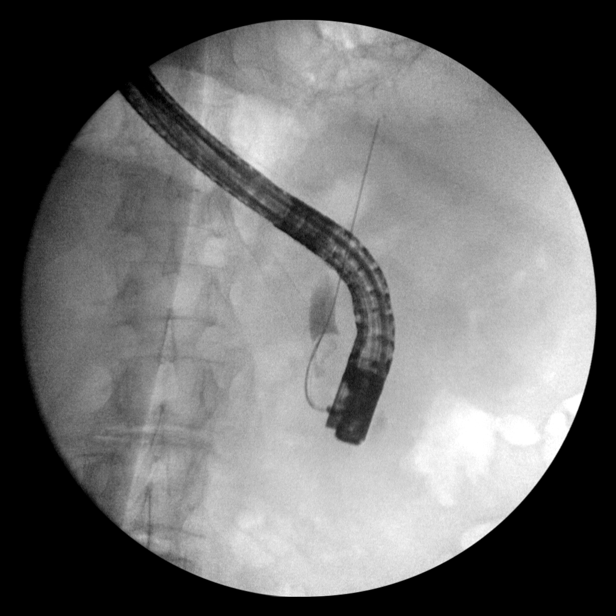
[im 5/12]
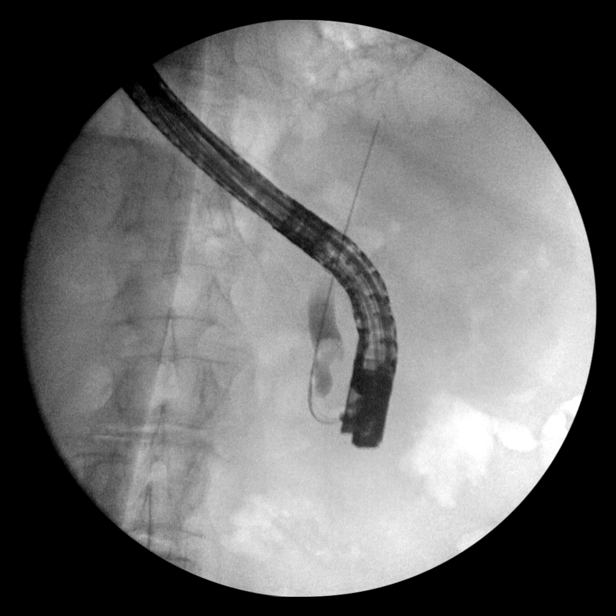
[im 6/12]
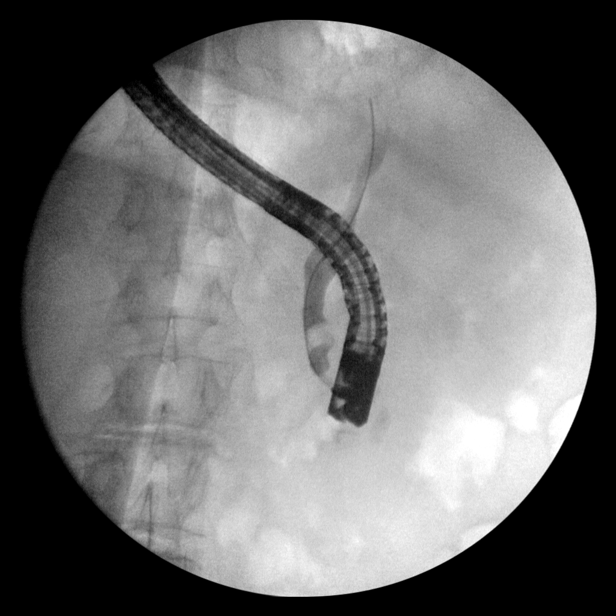
[im 7/12]
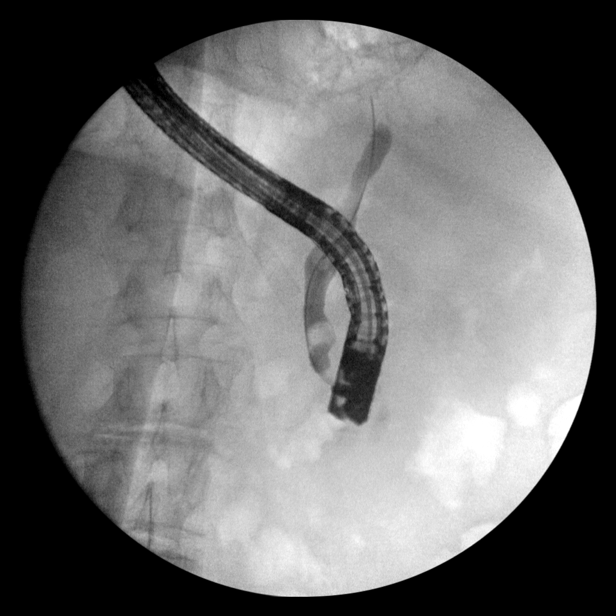
[im 8/12]
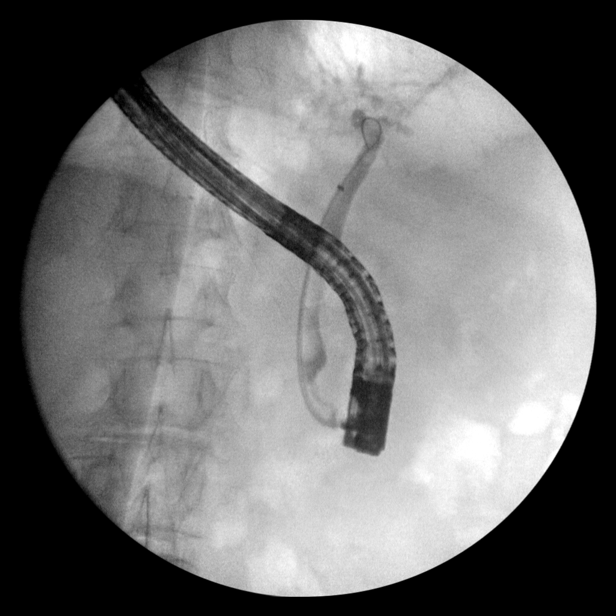
[im 9/12]
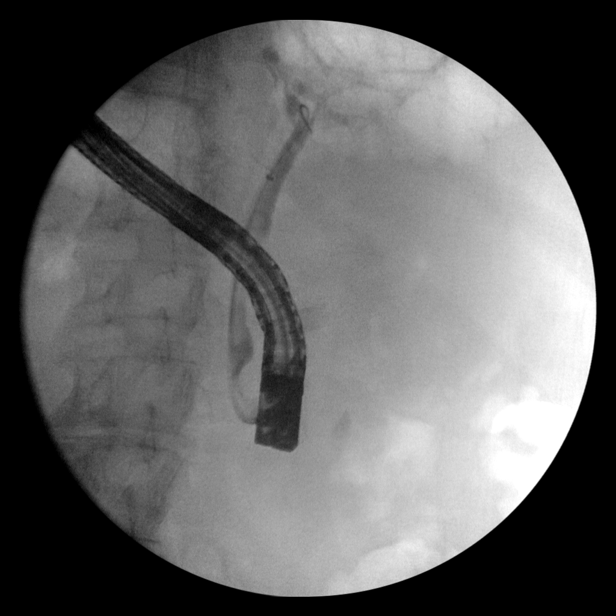
[im 10/12]
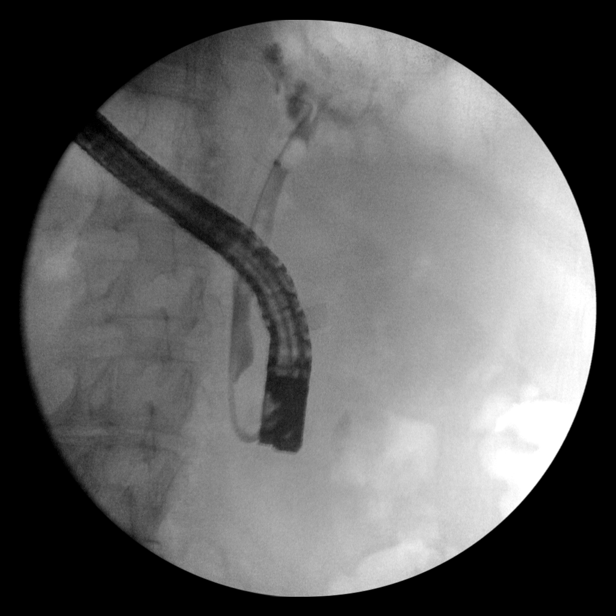
[im 11/12]
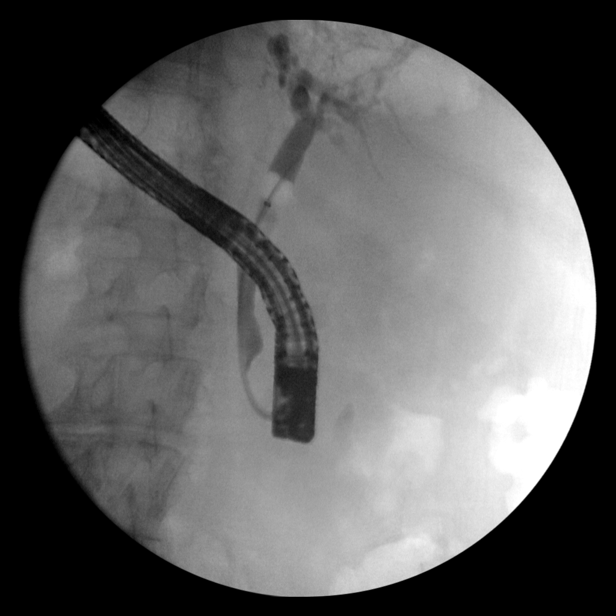
[im 11/12]
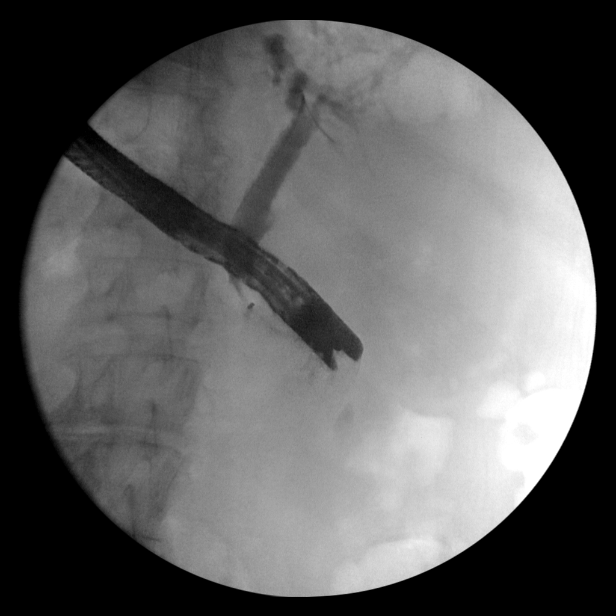
[im 11/12]
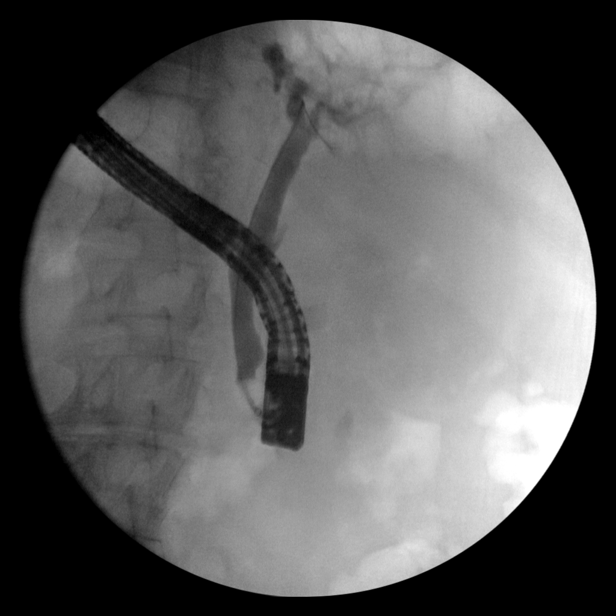
[im 11/12]
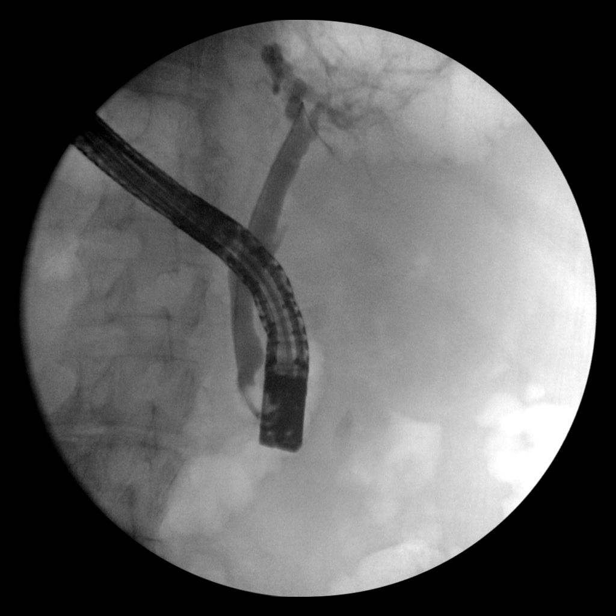
[im 12/12]
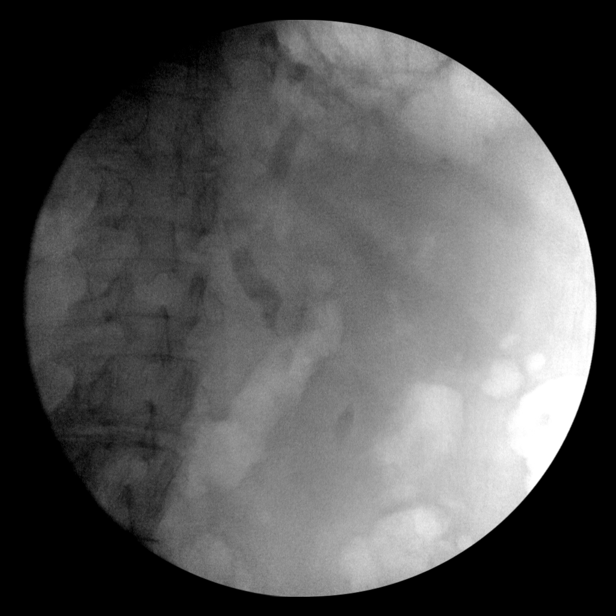

[15 of 15 positions shown; findings below may reference images not displayed]

FINDINGS: Total of 12 saved images are submitted for review. The images
demonstrate a flexible duodenal scope in the descending duodenum
followed by wire cannulation of the common bile duct. Subsequent
cholangiogram demonstrates filling defects in the distal common bile
duct consistent with choledocholithiasis. Subsequent images confirm
sphincterotomy and balloon sweeping of the common duct.
IMPRESSION: 1. Choledocholithiasis.
2. ERCP with sphincterotomy and balloon sweeping of the common duct.

These images were submitted for radiologic interpretation only.
Please see the procedural report for the amount of contrast and the
fluoroscopy time utilized.

## 2022-12-09 IMAGING — DX DG CHEST 1V PORT
1 series · 1 of 1 positions shown · non-contrast
Comparison: Chest x-ray dated June 06, 2020.

CLINICAL DATA: Shortness of breath.

EXAM:
PORTABLE CHEST 1 VIEW

[chest]
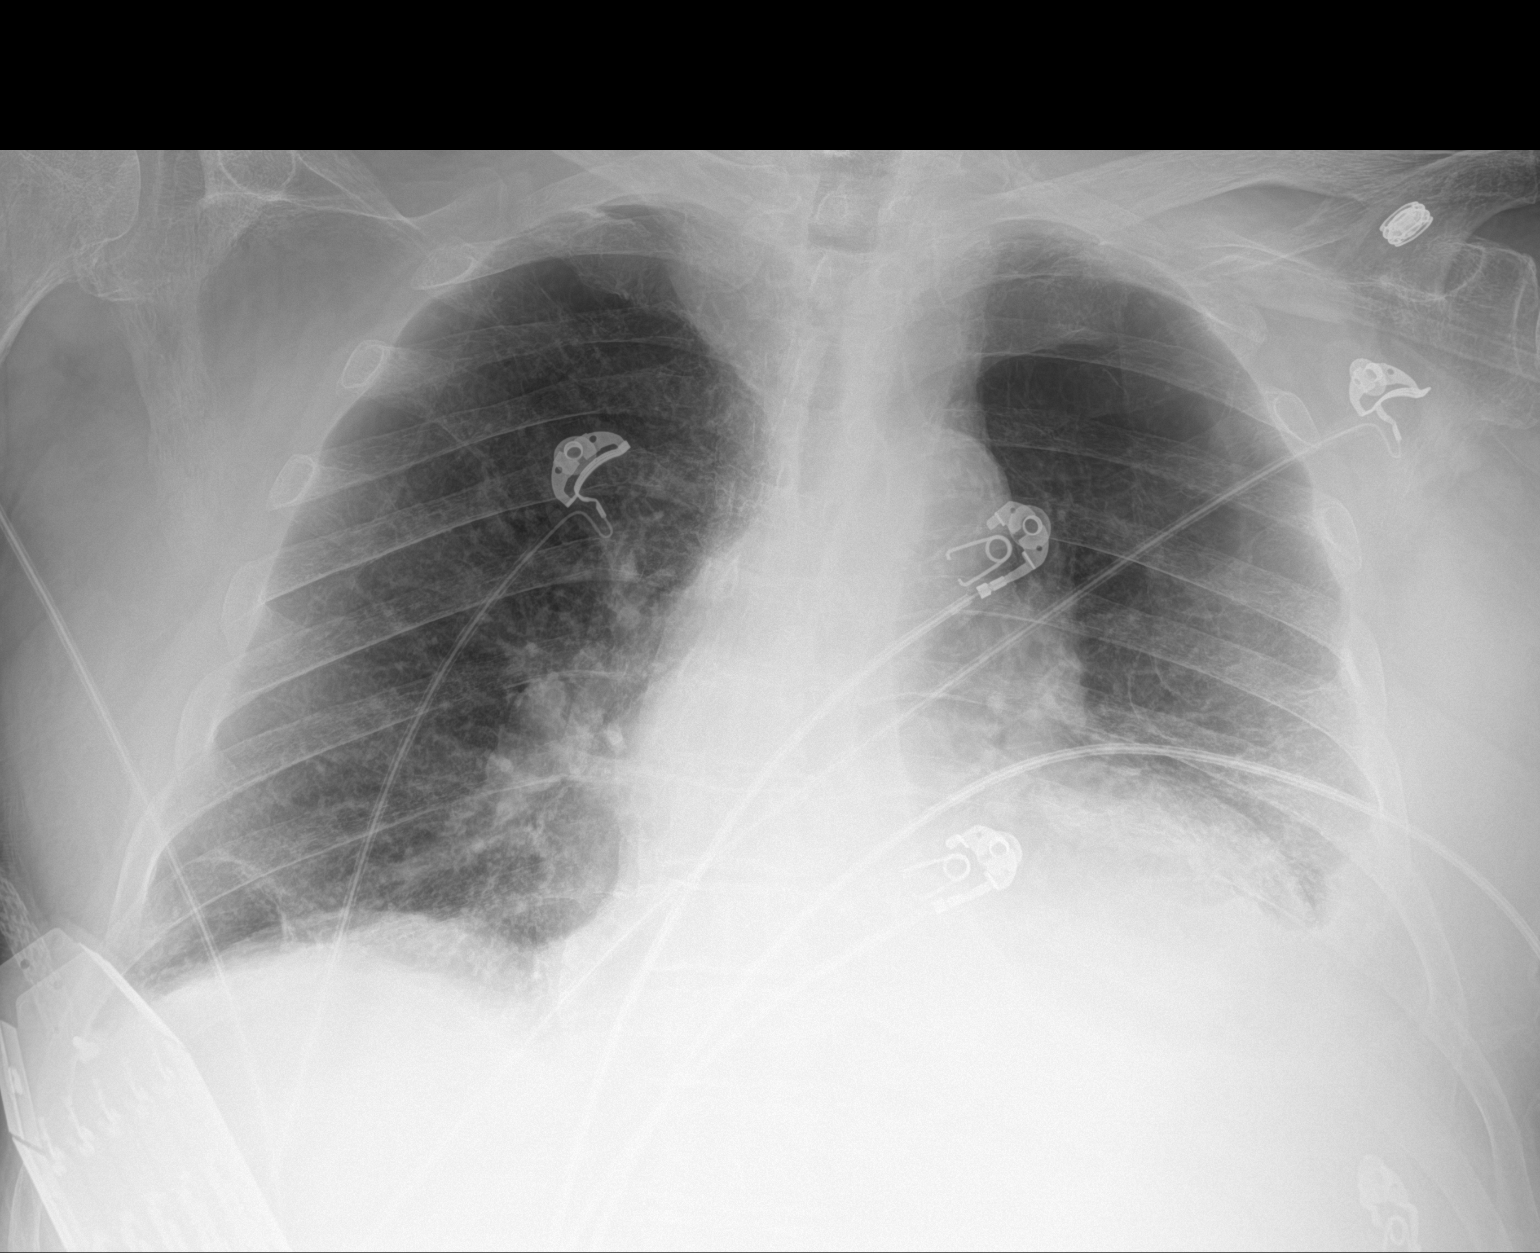

[1 of 1 positions shown; findings below may reference images not displayed]

FINDINGS: The heart size and mediastinal contours are within normal limits.
Normal pulmonary vascularity. Emphysematous changes again noted.
Increasing small left pleural effusion with left basilar opacity.
Unchanged scarring at the right lung base. No pneumothorax. No acute
osseous abnormality.
IMPRESSION: 1. Increasing small left pleural effusion with left basilar
atelectasis versus infiltrate.

## 2022-12-10 IMAGING — US IR PARACENTESIS
1 series · 3 of 3 positions shown · non-contrast
Comparison: none

INDICATION: Patient with history of cirrhosis found to have ascites. Request is
made for therapeutic paracentesis.

[Series 1: ir (id) (id)/(id)/(id) ir · 3 of 3 slices shown]
[im 1/3]
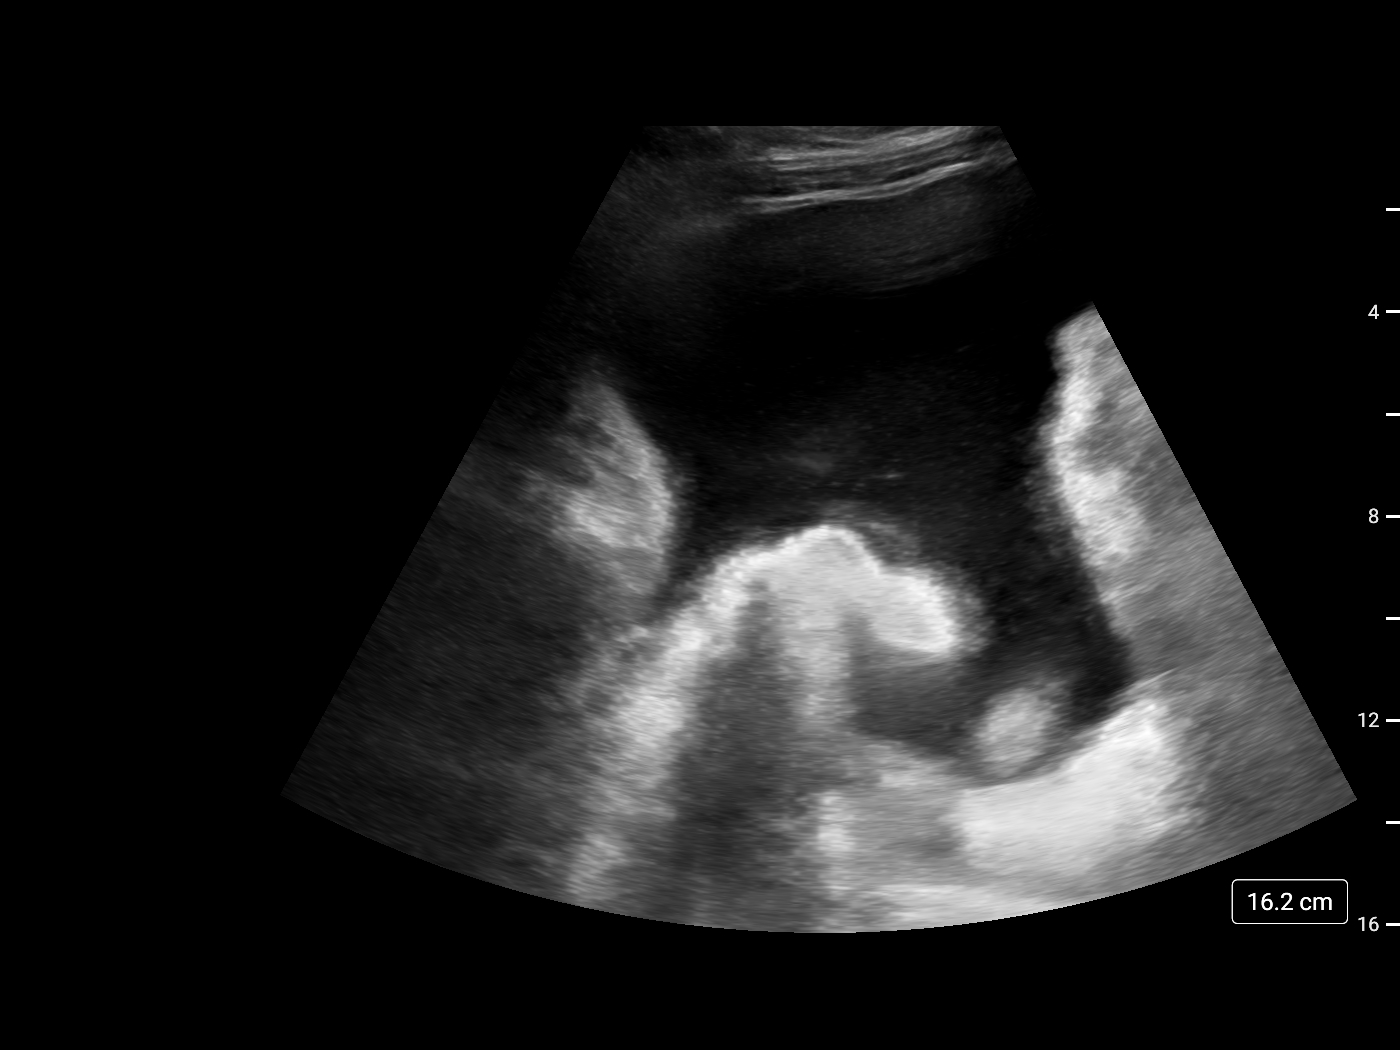
[im 2/3]
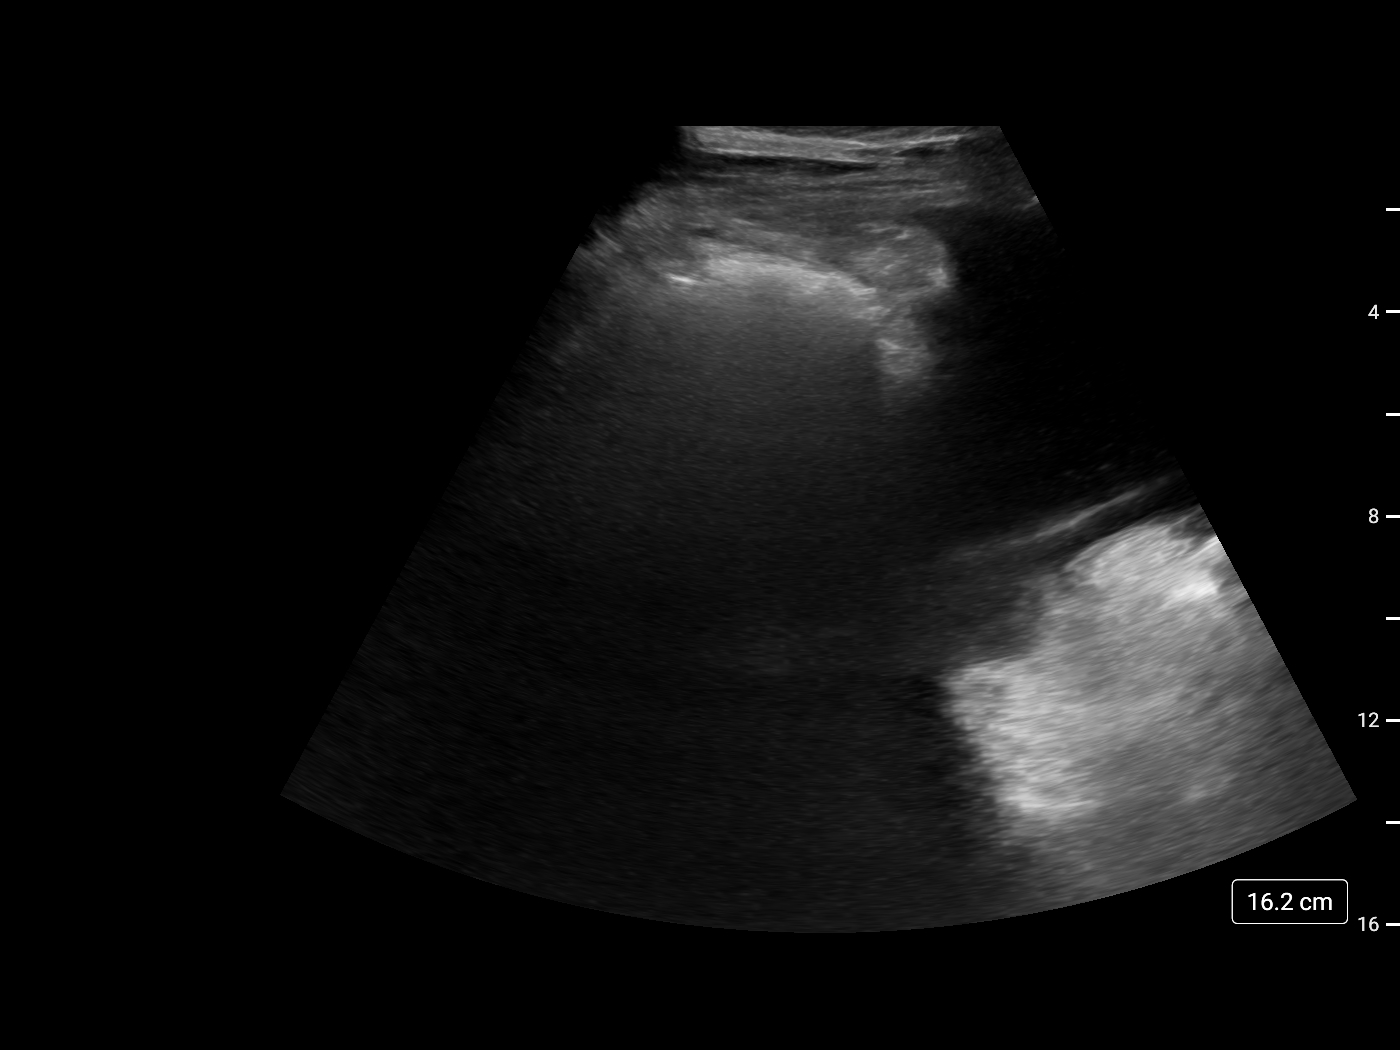
[im 3/3]
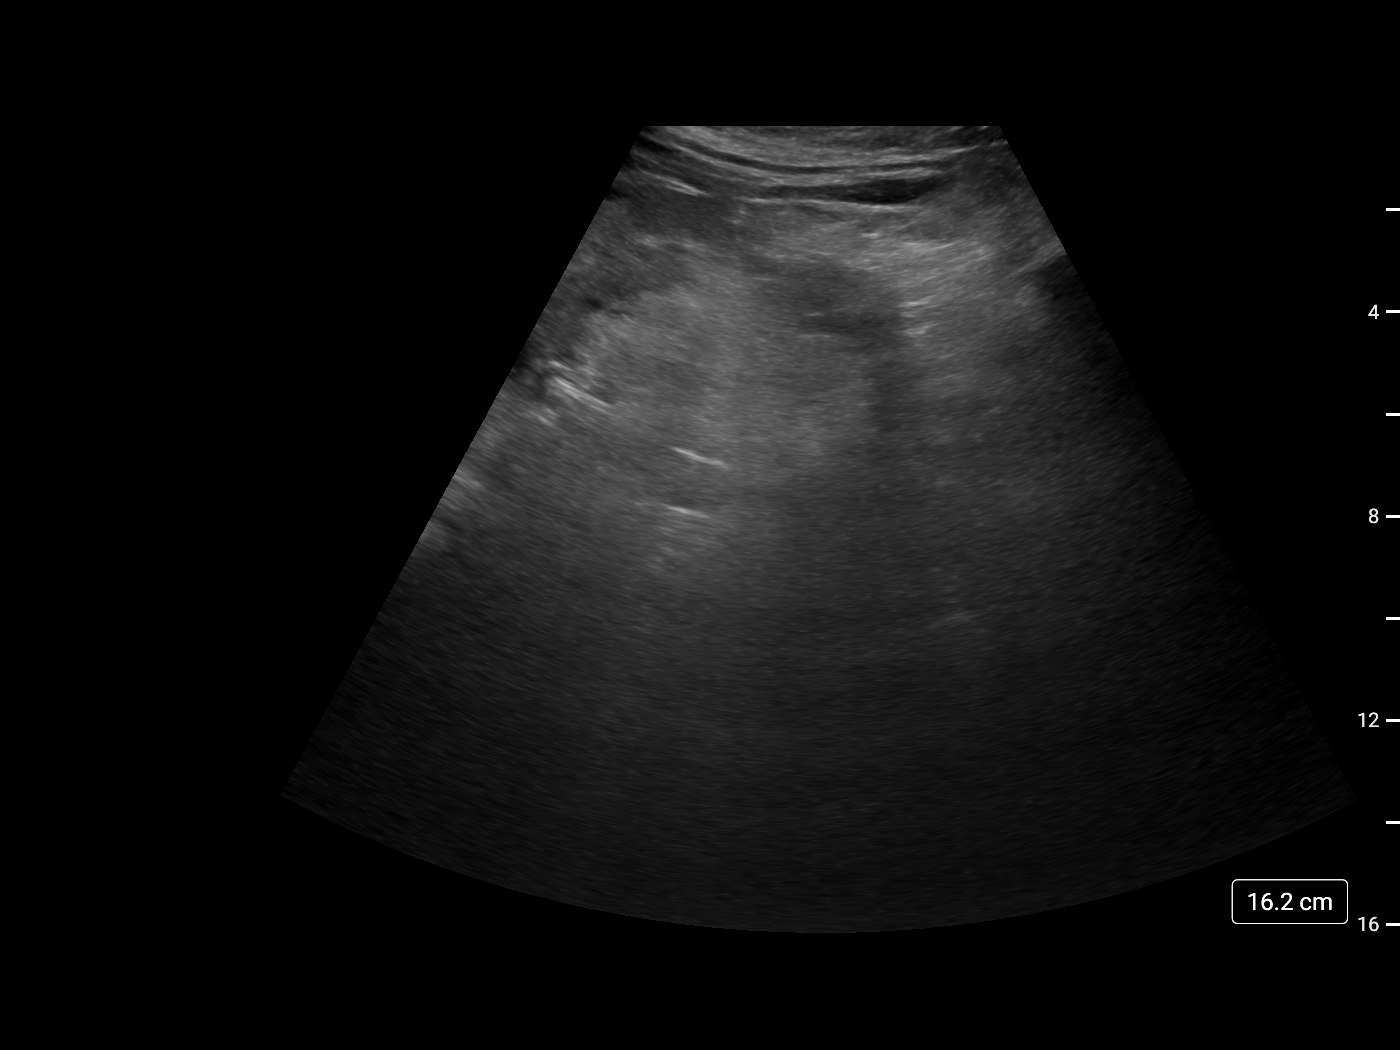

[3 of 3 positions shown; findings below may reference images not displayed]

EXAM:
ULTRASOUND GUIDED THERAPEUTIC PARACENTESIS

MEDICATIONS:
10 mL 1% lidocaine

COMPLICATIONS:
None immediate.

PROCEDURE:
Informed written consent was obtained from the patient after a
discussion of the risks, benefits and alternatives to treatment. A
timeout was performed prior to the initiation of the procedure.

Initial ultrasound scanning demonstrates a moderate amount of
ascites within the left lateral abdomen. The left lateral abdomen
was prepped and draped in the usual sterile fashion. 1% lidocaine
was used for local anesthesia.

Following this, a 19 gauge, 7-cm, Yueh catheter was introduced. An
ultrasound image was saved for documentation purposes. The
paracentesis was performed. The catheter was removed and a dressing
was applied. The patient tolerated the procedure well without
immediate post procedural complication.
FINDINGS: A total of approximately 3.4 liters of yellow fluid was removed.
Procedure was stopped prior to removal of fluid due to patient
developing asymptomatic hypotension.
IMPRESSION: Successful ultrasound-guided paracentesis yielding 3.4 liters of
peritoneal fluid.

## 2022-12-10 IMAGING — US US ABDOMEN LIMITED RUQ/ASCITES
1 series · 14 of 25 positions shown · non-contrast
Comparison: MRI 07/05/2020

CLINICAL DATA: [AGE] with abdominal ascites. History of
hepatocellular carcinoma.

EXAM:
ULTRASOUND ABDOMEN LIMITED RIGHT UPPER QUADRANT

[Series 1: us abdomen limited · 35 acquisitions, 14 frames shown]
[im 1/35]
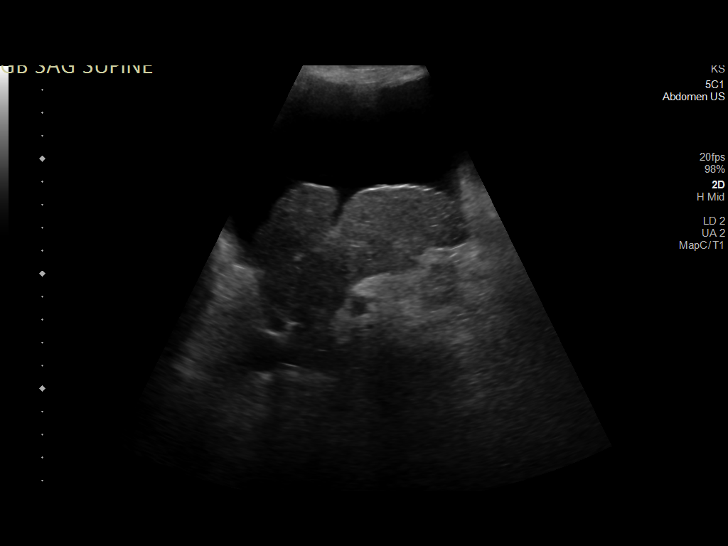
[im 3/35]
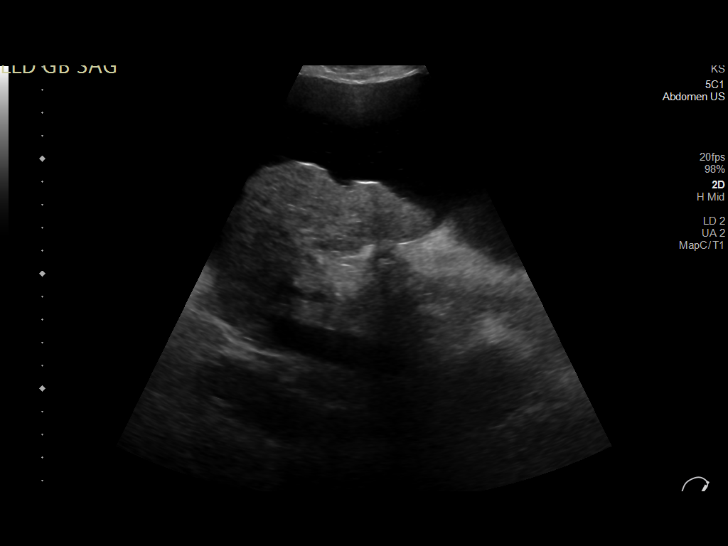
[im 6/35]
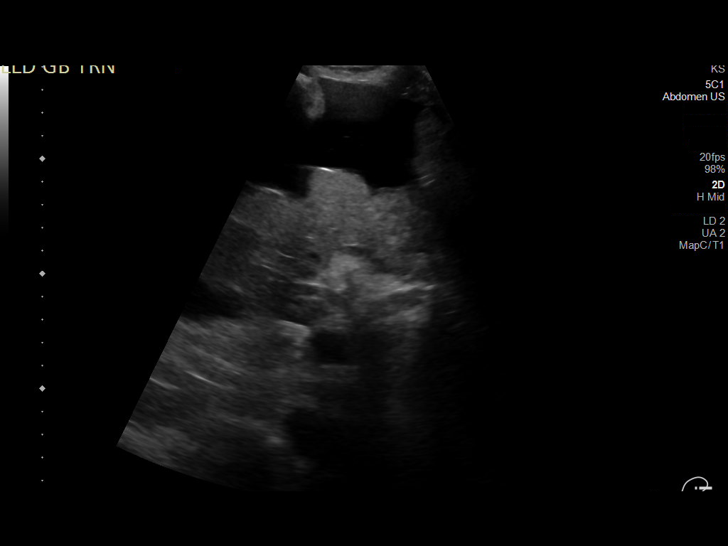
[im 9/35]
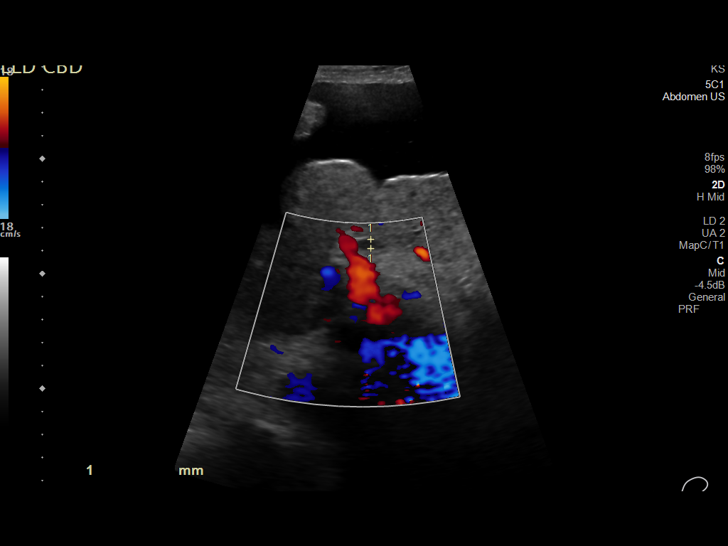
[im 12/35]
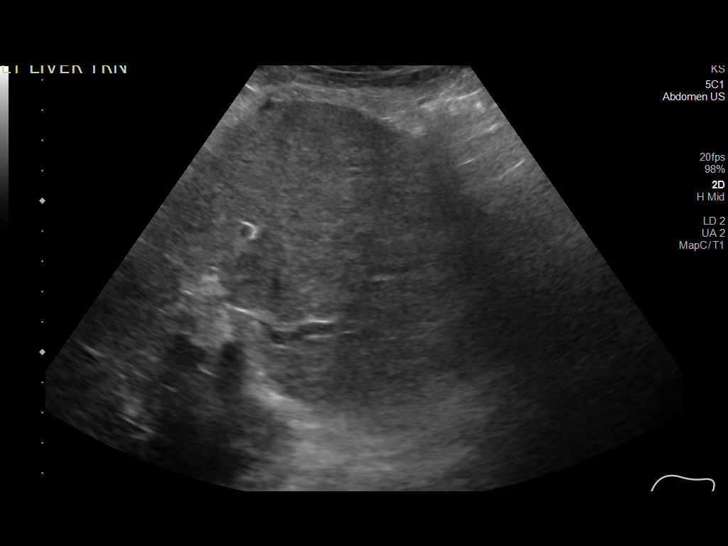
[im 13/35]
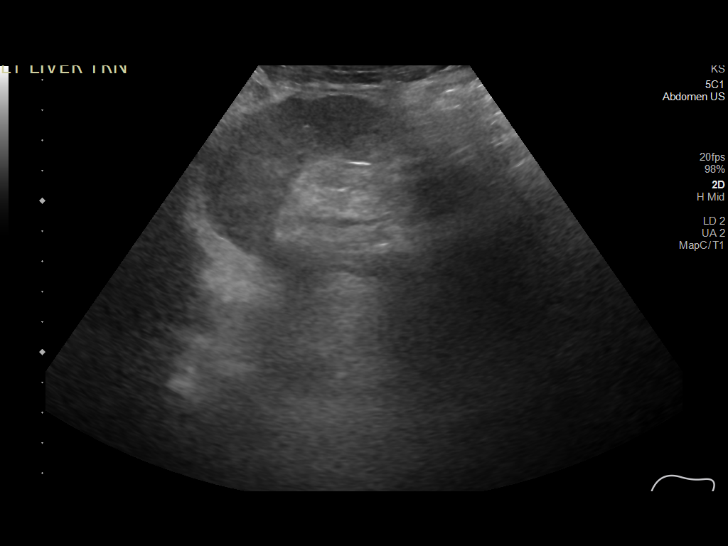
[im 16/35]
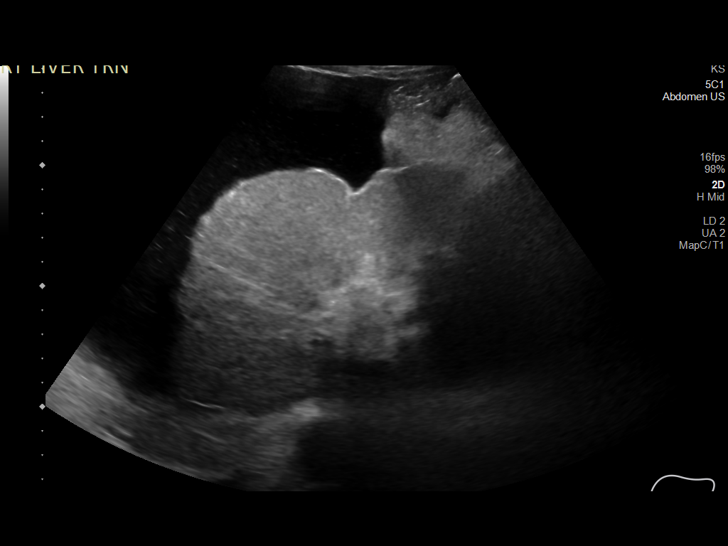
[im 19/35]
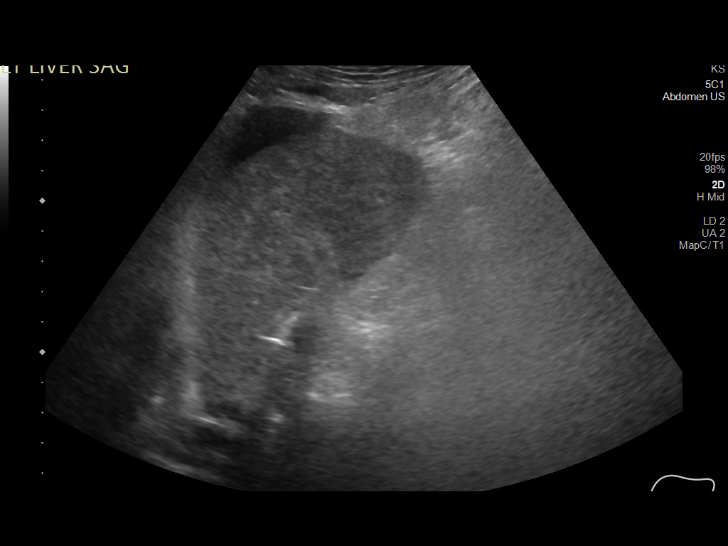
[im 22/35]
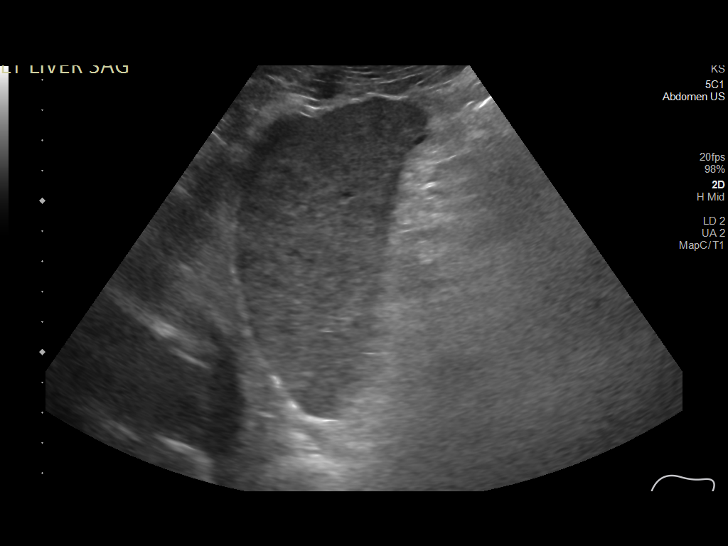
[im 23/35]
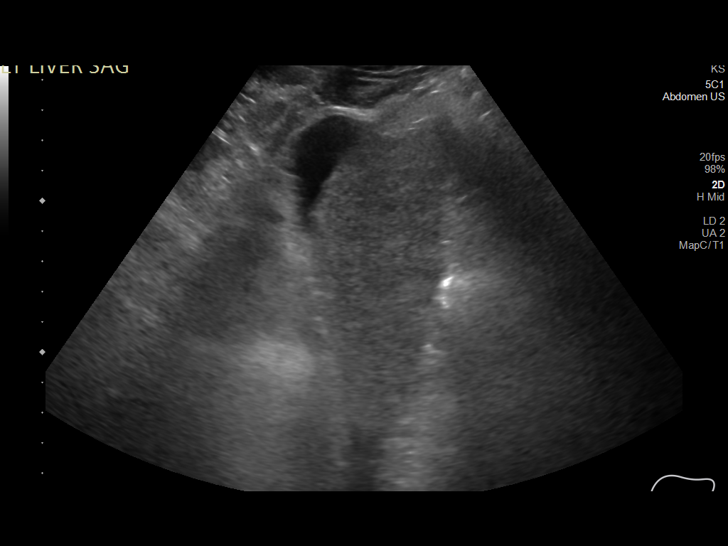
[im 26/35]
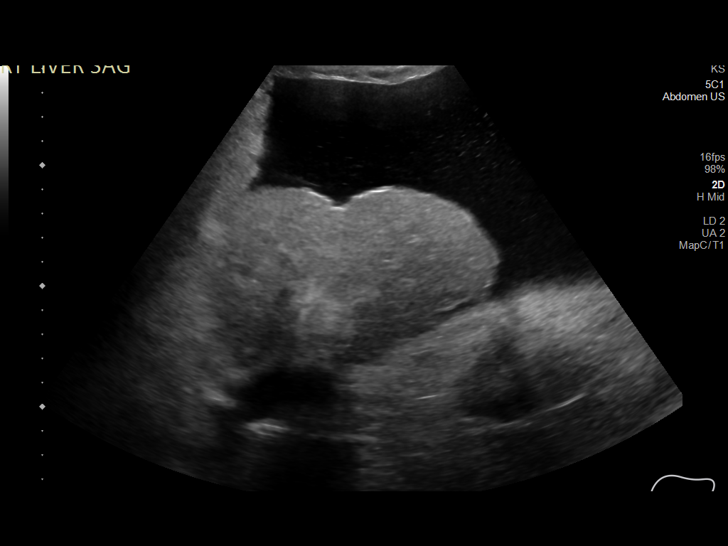
[im 29/35]
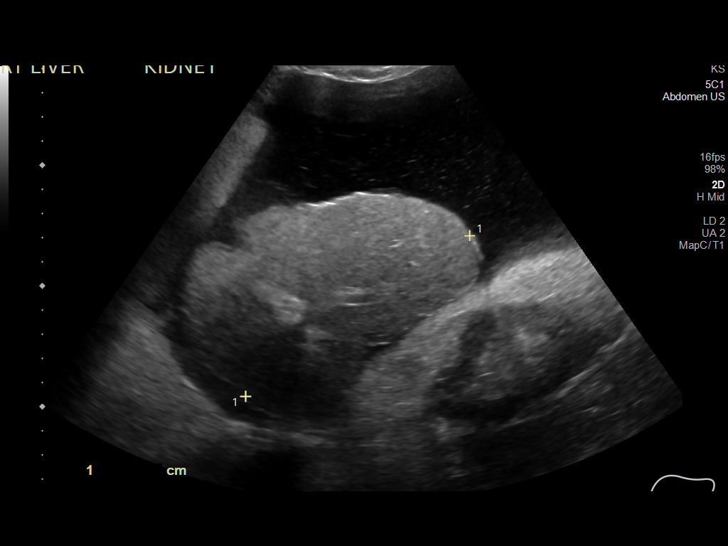
[im 32/35]
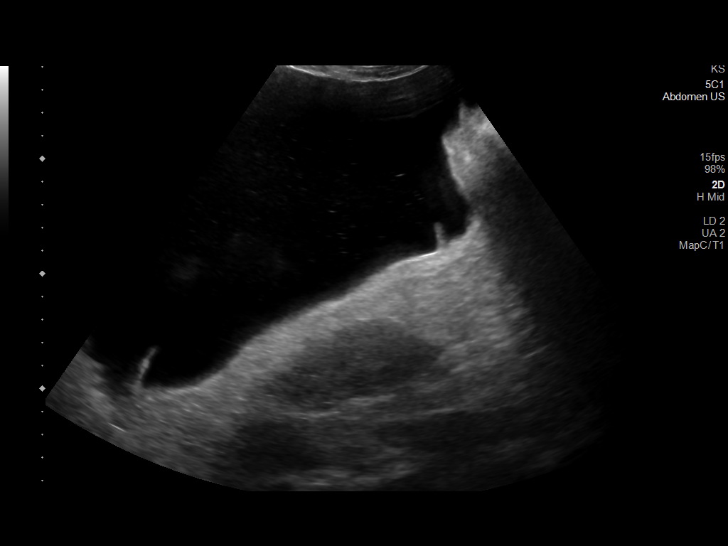
[im 35/35]
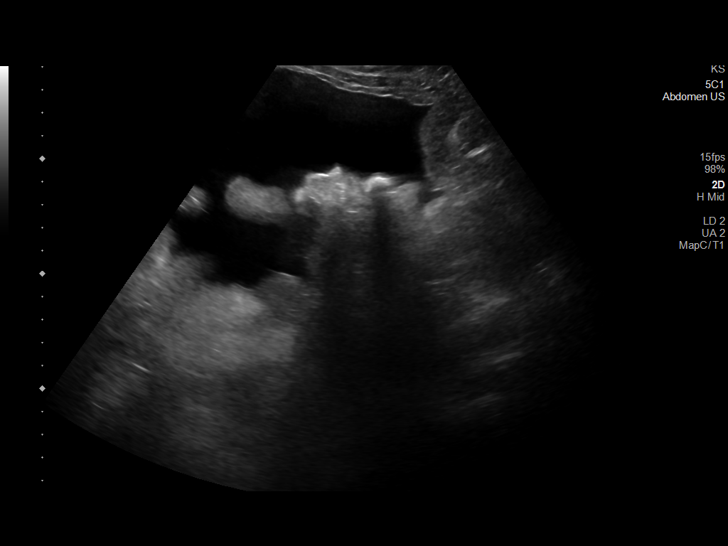

[14 of 25 positions shown; findings below may reference images not displayed]

FINDINGS: Gallbladder:

Not visualized.

Common bile duct:

Diameter: 4 mm

Liver:

Liver is severely nodular and compatible with cirrhosis. Liver
parenchyma is mildly heterogeneous. Large amount of perihepatic
ascites. Patient has a known right hepatic lesion that is not
clearly identified on this examination. Portal vein is patent on
color Doppler imaging with normal direction of blood flow towards
the liver.

Other: Minimal fluid in the right lower quadrant. Large amount of
ascites in the left upper quadrant. Large amount of ascites in left
lower quadrant.
IMPRESSION: 1. Cirrhotic liver with a large amount of ascites.
2. Gallbladder is not visualized.
3. Known hepatic lesion is not identified on this examination.
